# Patient Record
Sex: Male | Born: 1935 | Race: White | Hispanic: No | Marital: Married | Smoking: Never smoker
Health system: Southern US, Community
[De-identification: ages and names within clinical notes are randomized; demographics above are authoritative.]

## PROBLEM LIST (undated history)

## (undated) DIAGNOSIS — E039 Hypothyroidism, unspecified: Secondary | ICD-10-CM

## (undated) DIAGNOSIS — R0602 Shortness of breath: Secondary | ICD-10-CM

## (undated) DIAGNOSIS — J45909 Unspecified asthma, uncomplicated: Secondary | ICD-10-CM

## (undated) DIAGNOSIS — R011 Cardiac murmur, unspecified: Secondary | ICD-10-CM

## (undated) DIAGNOSIS — E785 Hyperlipidemia, unspecified: Secondary | ICD-10-CM

## (undated) DIAGNOSIS — K5730 Diverticulosis of large intestine without perforation or abscess without bleeding: Secondary | ICD-10-CM

## (undated) DIAGNOSIS — F40240 Claustrophobia: Secondary | ICD-10-CM

## (undated) DIAGNOSIS — C3490 Malignant neoplasm of unspecified part of unspecified bronchus or lung: Secondary | ICD-10-CM

## (undated) DIAGNOSIS — K219 Gastro-esophageal reflux disease without esophagitis: Secondary | ICD-10-CM

## (undated) DIAGNOSIS — R682 Dry mouth, unspecified: Secondary | ICD-10-CM

## (undated) DIAGNOSIS — R51 Headache: Secondary | ICD-10-CM

## (undated) DIAGNOSIS — C140 Malignant neoplasm of pharynx, unspecified: Secondary | ICD-10-CM

## (undated) DIAGNOSIS — K648 Other hemorrhoids: Secondary | ICD-10-CM

## (undated) DIAGNOSIS — J189 Pneumonia, unspecified organism: Secondary | ICD-10-CM

## (undated) DIAGNOSIS — K2940 Chronic atrophic gastritis without bleeding: Secondary | ICD-10-CM

## (undated) DIAGNOSIS — T4145XA Adverse effect of unspecified anesthetic, initial encounter: Secondary | ICD-10-CM

## (undated) DIAGNOSIS — N400 Enlarged prostate without lower urinary tract symptoms: Secondary | ICD-10-CM

## (undated) DIAGNOSIS — Z923 Personal history of irradiation: Secondary | ICD-10-CM

## (undated) DIAGNOSIS — T8859XA Other complications of anesthesia, initial encounter: Secondary | ICD-10-CM

## (undated) DIAGNOSIS — K635 Polyp of colon: Secondary | ICD-10-CM

## (undated) DIAGNOSIS — K117 Disturbances of salivary secretion: Secondary | ICD-10-CM

## (undated) DIAGNOSIS — G4733 Obstructive sleep apnea (adult) (pediatric): Secondary | ICD-10-CM

## (undated) DIAGNOSIS — N289 Disorder of kidney and ureter, unspecified: Secondary | ICD-10-CM

## (undated) DIAGNOSIS — D649 Anemia, unspecified: Secondary | ICD-10-CM

## (undated) HISTORY — DX: Malignant neoplasm of pharynx, unspecified: C14.0

## (undated) HISTORY — PX: OTHER SURGICAL HISTORY: SHX169

## (undated) HISTORY — PX: APPENDECTOMY: SHX54

## (undated) HISTORY — DX: Gastro-esophageal reflux disease without esophagitis: K21.9

## (undated) HISTORY — DX: Benign prostatic hyperplasia without lower urinary tract symptoms: N40.0

## (undated) HISTORY — PX: LUMBAR LAMINECTOMY: SHX95

## (undated) HISTORY — DX: Polyp of colon: K63.5

## (undated) HISTORY — DX: Chronic atrophic gastritis without bleeding: K29.40

## (undated) HISTORY — PX: TONSILLECTOMY: SHX5217

## (undated) HISTORY — DX: Disorder of kidney and ureter, unspecified: N28.9

## (undated) HISTORY — DX: Obstructive sleep apnea (adult) (pediatric): G47.33

## (undated) HISTORY — DX: Hyperlipidemia, unspecified: E78.5

## (undated) HISTORY — DX: Dry mouth, unspecified: R68.2

## (undated) HISTORY — DX: Disturbances of salivary secretion: K11.7

## (undated) HISTORY — DX: Diverticulosis of large intestine without perforation or abscess without bleeding: K57.30

## (undated) HISTORY — DX: Other hemorrhoids: K64.8

---

## 1999-02-25 ENCOUNTER — Inpatient Hospital Stay (HOSPITAL_COMMUNITY): Admission: EM | Admit: 1999-02-25 | Discharge: 1999-02-26 | Payer: Self-pay | Admitting: Emergency Medicine

## 1999-02-25 ENCOUNTER — Encounter: Payer: Self-pay | Admitting: Internal Medicine

## 2000-01-02 ENCOUNTER — Encounter (INDEPENDENT_AMBULATORY_CARE_PROVIDER_SITE_OTHER): Payer: Self-pay | Admitting: Specialist

## 2000-01-02 ENCOUNTER — Ambulatory Visit (HOSPITAL_COMMUNITY): Admission: RE | Admit: 2000-01-02 | Discharge: 2000-01-02 | Payer: Self-pay | Admitting: Gastroenterology

## 2002-03-14 ENCOUNTER — Ambulatory Visit (HOSPITAL_BASED_OUTPATIENT_CLINIC_OR_DEPARTMENT_OTHER): Admission: RE | Admit: 2002-03-14 | Discharge: 2002-03-14 | Payer: Self-pay | Admitting: Internal Medicine

## 2002-06-23 ENCOUNTER — Encounter: Payer: Self-pay | Admitting: Pulmonary Disease

## 2002-06-23 ENCOUNTER — Ambulatory Visit (HOSPITAL_COMMUNITY): Admission: RE | Admit: 2002-06-23 | Discharge: 2002-06-23 | Payer: Self-pay | Admitting: Pulmonary Disease

## 2004-01-10 ENCOUNTER — Ambulatory Visit: Payer: Self-pay | Admitting: Gastroenterology

## 2004-03-01 ENCOUNTER — Ambulatory Visit: Payer: Self-pay | Admitting: Internal Medicine

## 2004-03-05 ENCOUNTER — Ambulatory Visit: Payer: Self-pay

## 2004-03-28 ENCOUNTER — Ambulatory Visit: Payer: Self-pay | Admitting: Pulmonary Disease

## 2005-11-11 ENCOUNTER — Ambulatory Visit: Payer: Self-pay | Admitting: Internal Medicine

## 2005-11-11 ENCOUNTER — Ambulatory Visit: Payer: Self-pay | Admitting: Gastroenterology

## 2005-11-11 ENCOUNTER — Encounter: Admission: RE | Admit: 2005-11-11 | Discharge: 2005-11-11 | Payer: Self-pay | Admitting: Internal Medicine

## 2005-11-20 ENCOUNTER — Ambulatory Visit: Payer: Self-pay | Admitting: Endocrinology

## 2005-11-20 ENCOUNTER — Encounter: Admission: RE | Admit: 2005-11-20 | Discharge: 2005-11-20 | Payer: Self-pay | Admitting: Endocrinology

## 2005-11-24 ENCOUNTER — Ambulatory Visit: Payer: Self-pay | Admitting: Internal Medicine

## 2005-11-25 ENCOUNTER — Ambulatory Visit (HOSPITAL_COMMUNITY): Admission: RE | Admit: 2005-11-25 | Discharge: 2005-11-25 | Payer: Self-pay | Admitting: Gastroenterology

## 2005-11-25 ENCOUNTER — Ambulatory Visit: Payer: Self-pay | Admitting: Gastroenterology

## 2005-12-22 ENCOUNTER — Other Ambulatory Visit: Admission: RE | Admit: 2005-12-22 | Discharge: 2005-12-22 | Payer: Self-pay | Admitting: Otolaryngology

## 2006-01-07 ENCOUNTER — Encounter (INDEPENDENT_AMBULATORY_CARE_PROVIDER_SITE_OTHER): Payer: Self-pay | Admitting: Specialist

## 2006-01-07 ENCOUNTER — Ambulatory Visit (HOSPITAL_COMMUNITY): Admission: RE | Admit: 2006-01-07 | Discharge: 2006-01-07 | Payer: Self-pay | Admitting: Otolaryngology

## 2006-01-14 ENCOUNTER — Ambulatory Visit (HOSPITAL_COMMUNITY): Admission: RE | Admit: 2006-01-14 | Discharge: 2006-01-14 | Payer: Self-pay | Admitting: Otolaryngology

## 2006-01-20 HISTORY — PX: OTHER SURGICAL HISTORY: SHX169

## 2006-10-17 ENCOUNTER — Encounter: Payer: Self-pay | Admitting: *Deleted

## 2006-10-17 DIAGNOSIS — E785 Hyperlipidemia, unspecified: Secondary | ICD-10-CM

## 2006-10-17 DIAGNOSIS — N400 Enlarged prostate without lower urinary tract symptoms: Secondary | ICD-10-CM | POA: Insufficient documentation

## 2006-10-17 DIAGNOSIS — E669 Obesity, unspecified: Secondary | ICD-10-CM

## 2006-10-17 DIAGNOSIS — G4730 Sleep apnea, unspecified: Secondary | ICD-10-CM | POA: Insufficient documentation

## 2006-10-17 DIAGNOSIS — K219 Gastro-esophageal reflux disease without esophagitis: Secondary | ICD-10-CM

## 2006-10-26 ENCOUNTER — Encounter: Payer: Self-pay | Admitting: Internal Medicine

## 2006-12-07 ENCOUNTER — Encounter: Payer: Self-pay | Admitting: Internal Medicine

## 2007-01-18 ENCOUNTER — Encounter: Payer: Self-pay | Admitting: Internal Medicine

## 2007-01-21 HISTORY — PX: OTHER SURGICAL HISTORY: SHX169

## 2007-05-04 ENCOUNTER — Encounter: Payer: Self-pay | Admitting: Internal Medicine

## 2007-05-31 ENCOUNTER — Encounter: Payer: Self-pay | Admitting: Internal Medicine

## 2007-08-17 ENCOUNTER — Telehealth: Payer: Self-pay | Admitting: Internal Medicine

## 2007-08-17 ENCOUNTER — Ambulatory Visit: Payer: Self-pay | Admitting: Internal Medicine

## 2007-08-17 LAB — CONVERTED CEMR LAB
BUN: 16 mg/dL
Basophils Absolute: 0 10*3/uL
Basophils Relative: 0 %
CO2: 29 meq/L
Calcium: 9.2 mg/dL
Chloride: 104 meq/L
Creatinine, Ser: 1 mg/dL
Eosinophils Absolute: 0.1 10*3/uL
Eosinophils Relative: 1.3 %
GFR calc Af Amer: 95 mL/min
GFR calc non Af Amer: 78 mL/min
Glucose, Bld: 106 mg/dL — ABNORMAL HIGH
HCT: 41.1 %
Hemoglobin: 13.9 g/dL
Lymphocytes Relative: 15.8 %
MCHC: 33.9 g/dL
MCV: 97.5 fL
Monocytes Absolute: 0.5 10*3/uL
Monocytes Relative: 9.8 %
Neutro Abs: 3.3 10*3/uL
Neutrophils Relative %: 73.1 %
Platelets: 190 10*3/uL
Potassium: 3.9 meq/L
RBC: 4.21 M/uL — ABNORMAL LOW
RDW: 13.2 %
Sodium: 140 meq/L
TSH: 2.28 u[IU]/mL
WBC: 4.6 10*3/uL

## 2007-08-18 ENCOUNTER — Ambulatory Visit: Payer: Self-pay | Admitting: Internal Medicine

## 2007-08-18 DIAGNOSIS — S139XXA Sprain of joints and ligaments of unspecified parts of neck, initial encounter: Secondary | ICD-10-CM

## 2007-08-18 DIAGNOSIS — R5381 Other malaise: Secondary | ICD-10-CM

## 2007-08-18 DIAGNOSIS — R5383 Other fatigue: Secondary | ICD-10-CM

## 2007-08-18 DIAGNOSIS — K117 Disturbances of salivary secretion: Secondary | ICD-10-CM

## 2007-10-04 ENCOUNTER — Encounter: Payer: Self-pay | Admitting: Internal Medicine

## 2007-11-02 ENCOUNTER — Encounter: Payer: Self-pay | Admitting: Internal Medicine

## 2007-11-16 ENCOUNTER — Encounter: Payer: Self-pay | Admitting: Internal Medicine

## 2007-12-17 ENCOUNTER — Ambulatory Visit (HOSPITAL_COMMUNITY): Admission: RE | Admit: 2007-12-17 | Discharge: 2007-12-17 | Payer: Self-pay | Admitting: Emergency Medicine

## 2007-12-20 ENCOUNTER — Telehealth (INDEPENDENT_AMBULATORY_CARE_PROVIDER_SITE_OTHER): Payer: Self-pay | Admitting: *Deleted

## 2007-12-21 HISTORY — PX: CHOLECYSTECTOMY: SHX55

## 2007-12-23 ENCOUNTER — Ambulatory Visit: Payer: Self-pay | Admitting: Internal Medicine

## 2007-12-23 DIAGNOSIS — J90 Pleural effusion, not elsewhere classified: Secondary | ICD-10-CM | POA: Insufficient documentation

## 2007-12-24 ENCOUNTER — Encounter: Payer: Self-pay | Admitting: Internal Medicine

## 2007-12-24 ENCOUNTER — Telehealth: Payer: Self-pay | Admitting: Internal Medicine

## 2007-12-29 ENCOUNTER — Ambulatory Visit (HOSPITAL_COMMUNITY): Admission: RE | Admit: 2007-12-29 | Discharge: 2007-12-29 | Payer: Self-pay | Admitting: Internal Medicine

## 2008-01-06 ENCOUNTER — Ambulatory Visit: Payer: Self-pay | Admitting: Internal Medicine

## 2008-01-06 ENCOUNTER — Telehealth: Payer: Self-pay | Admitting: Internal Medicine

## 2008-01-06 ENCOUNTER — Telehealth: Payer: Self-pay | Admitting: Family Medicine

## 2008-01-06 DIAGNOSIS — R1011 Right upper quadrant pain: Secondary | ICD-10-CM

## 2008-01-07 ENCOUNTER — Encounter (INDEPENDENT_AMBULATORY_CARE_PROVIDER_SITE_OTHER): Payer: Self-pay | Admitting: Surgery

## 2008-01-07 ENCOUNTER — Inpatient Hospital Stay (HOSPITAL_COMMUNITY): Admission: AD | Admit: 2008-01-07 | Discharge: 2008-01-08 | Payer: Self-pay | Admitting: Internal Medicine

## 2008-01-07 ENCOUNTER — Ambulatory Visit: Payer: Self-pay | Admitting: Internal Medicine

## 2008-01-07 ENCOUNTER — Encounter: Payer: Self-pay | Admitting: Internal Medicine

## 2008-01-25 ENCOUNTER — Ambulatory Visit: Payer: Self-pay | Admitting: Internal Medicine

## 2008-02-03 ENCOUNTER — Telehealth: Payer: Self-pay | Admitting: Internal Medicine

## 2008-02-04 ENCOUNTER — Ambulatory Visit: Payer: Self-pay | Admitting: Internal Medicine

## 2008-02-06 ENCOUNTER — Telehealth: Payer: Self-pay | Admitting: Internal Medicine

## 2008-09-12 ENCOUNTER — Ambulatory Visit (HOSPITAL_COMMUNITY): Admission: RE | Admit: 2008-09-12 | Discharge: 2008-09-12 | Payer: Self-pay | Admitting: Unknown Physician Specialty

## 2008-10-09 ENCOUNTER — Encounter: Payer: Self-pay | Admitting: Internal Medicine

## 2008-12-26 ENCOUNTER — Encounter: Payer: Self-pay | Admitting: Internal Medicine

## 2008-12-29 ENCOUNTER — Telehealth: Payer: Self-pay | Admitting: Internal Medicine

## 2009-04-09 ENCOUNTER — Encounter: Payer: Self-pay | Admitting: Internal Medicine

## 2009-07-11 ENCOUNTER — Telehealth: Payer: Self-pay | Admitting: Internal Medicine

## 2009-09-25 ENCOUNTER — Ambulatory Visit (HOSPITAL_COMMUNITY): Admission: RE | Admit: 2009-09-25 | Discharge: 2009-09-25 | Payer: Self-pay | Admitting: Unknown Physician Specialty

## 2009-10-03 ENCOUNTER — Encounter: Payer: Self-pay | Admitting: Internal Medicine

## 2009-10-12 ENCOUNTER — Encounter: Payer: Self-pay | Admitting: Internal Medicine

## 2009-10-26 ENCOUNTER — Telehealth: Payer: Self-pay | Admitting: Internal Medicine

## 2009-11-16 ENCOUNTER — Telehealth: Payer: Self-pay | Admitting: Internal Medicine

## 2010-02-09 ENCOUNTER — Encounter: Payer: Self-pay | Admitting: Internal Medicine

## 2010-02-19 NOTE — Progress Notes (Signed)
  Phone Note Refill Request Message from:  Fax from Pharmacy on October 26, 2009 8:23 AM  Refills Requested: Medication #1:  DIAZEPAM 2 MG  TABS 1/2 tab up to 3 tabs by mouth as needed for muscle spasm-neck   Last Refilled: 10/13/2009 Please Advise refill, fax from [facility]  Initial call taken by: Ami Bullins CMA,  October 26, 2009 8:23 AM  Follow-up for Phone Call        ok x 5 Follow-up by: Jacques Navy MD,  October 26, 2009 8:36 AM    Prescriptions: DIAZEPAM 2 MG  TABS (DIAZEPAM) 1/2 tab up to 3 tabs by mouth as needed for muscle spasm-neck  #60 x 5   Entered by:   Ami Bullins CMA   Authorized by:   Jacques Navy MD   Signed by:   Bill Salinas CMA on 10/26/2009   Method used:   Telephoned to ...       [facility]* (retail)       [address]       [place], [place]  [id number]       Ph: [phone]       Fax: [phone]   RxID:   [id number]

## 2010-02-19 NOTE — Letter (Signed)
Summary: [facility]   Imported By: Lennie Odor 10/08/2009 15:11:57  _____________________________________________________________________  External Attachment:    Type:   Image     Comment:   External Document

## 2010-02-19 NOTE — Letter (Signed)
Summary: [facility]   Imported By: Sherian Rein 04/16/2009 12:03:55  _____________________________________________________________________  External Attachment:    Type:   Image     Comment:   External Document

## 2010-02-19 NOTE — Letter (Signed)
Summary: [facility]   Imported By: Lester Mayville 10/15/2009 08:25:51  _____________________________________________________________________  External Attachment:    Type:   Image     Comment:   External Document

## 2010-02-19 NOTE — Letter (Signed)
Summary: [facility]   Imported By: Lennie Odor 10/23/2009 15:05:31  _____________________________________________________________________  External Attachment:    Type:   Image     Comment:   External Document

## 2010-02-19 NOTE — Progress Notes (Signed)
Summary: RX  Phone Note Call from Patient   Caller: Patient Reason for Call: Talk to Nurse Summary of Call: WANTS TO CHANGE THE RX OF DIAZEPAM 2MG , PT STATES HE IS TAKING 3 PILLS IN THE AM AND PM, WANTS TO CHANGE DOSAGE, DUE TO COST, POSSIBLY 6MG  AND 60 TABS TO LAST HIM A MTH, PLEASE SEND TO [place] Initial call taken by: Migdalia Dk,  November 16, 2009 1:19 PM    New/Updated Medications: DIAZEPAM 5 MG TABS (DIAZEPAM) 1 tab two times a day Prescriptions: DIAZEPAM 5 MG TABS (DIAZEPAM) 1 tab two times a day  #60 x 5   Entered by:   Ami Bullins CMA   Authorized by:   Jacques Navy MD   Signed by:   Bill Salinas CMA on 11/16/2009   Method used:   Telephoned to ...       [facility]* (retail)       [address]       [place], [place]  [id number]       Ph: [phone]       Fax: [phone]   RxID:   [id number]

## 2010-02-19 NOTE — Progress Notes (Signed)
  Phone Note Refill Request Message from:  Fax from Pharmacy on July 11, 2009 9:57 AM  Refills Requested: Medication #1:  DIAZEPAM 2 MG  TABS 1/2 tab up to 3 tabs by mouth as needed for muscle spasm-neck Please Advise refill for pt  Initial call taken by: Ami Bullins CMA,  July 11, 2009 9:58 AM  Follow-up for Phone Call        ok for refill x 5 Follow-up by: Jacques Navy MD,  July 11, 2009 1:24 PM    Prescriptions: DIAZEPAM 2 MG  TABS (DIAZEPAM) 1/2 tab up to 3 tabs by mouth as needed for muscle spasm-neck  #60 x 5   Entered by:   Ami Bullins CMA   Authorized by:   Jacques Navy MD   Signed by:   Bill Salinas CMA on 07/11/2009   Method used:   Telephoned to ...       [facility]* (retail)       [address]       [place], [place]  [id number]       Ph: [phone]       Fax: [phone]   RxID:   [id number]

## 2010-04-04 LAB — POCT I-STAT, CHEM 8
Calcium, Ion: 1.2 mmol/L (ref 1.12–1.32)
Creatinine, Ser: 1.3 mg/dL (ref 0.4–1.5)
Glucose, Bld: 106 mg/dL — ABNORMAL HIGH (ref 70–99)
HCT: 48 % (ref 39.0–52.0)
TCO2: 29 mmol/L (ref 0–100)

## 2010-04-10 ENCOUNTER — Ambulatory Visit (HOSPITAL_COMMUNITY): Payer: BLUE CROSS/BLUE SHIELD

## 2010-04-10 ENCOUNTER — Other Ambulatory Visit (HOSPITAL_COMMUNITY): Payer: Self-pay | Admitting: *Deleted

## 2010-04-11 ENCOUNTER — Ambulatory Visit (HOSPITAL_COMMUNITY)
Admission: RE | Admit: 2010-04-11 | Discharge: 2010-04-11 | Disposition: A | Discharge status: Home / Self Care | Payer: Medicare Other | Source: Ambulatory Visit | Attending: Internal Medicine | Admitting: Internal Medicine

## 2010-04-11 DIAGNOSIS — Z09 Encounter for follow-up examination after completed treatment for conditions other than malignant neoplasm: Secondary | ICD-10-CM | POA: Insufficient documentation

## 2010-04-11 DIAGNOSIS — Z8589 Personal history of malignant neoplasm of other organs and systems: Secondary | ICD-10-CM | POA: Insufficient documentation

## 2010-05-30 ENCOUNTER — Other Ambulatory Visit (HOSPITAL_COMMUNITY): Payer: Self-pay | Admitting: Internal Medicine

## 2010-06-04 NOTE — Op Note (Signed)
Samuel Hurley, Samuel Hurley              ACCOUNT NO.:  [id number]   MEDICAL RECORD NO.:  [id number]          PATIENT TYPE:  INP   LOCATION:  1505                         FACILITY:  [facility]   PHYSICIAN:  Thornton Park. Daphine Deutscher, MD  DATE OF BIRTH:  08-03-1934   DATE OF PROCEDURE:  01/07/2008  DATE OF DISCHARGE:  01/08/2008                               OPERATIVE REPORT   PREOPERATIVE DIAGNOSES:  Chronic cholecystitis, cholelithiasis.   POSTOPERATIVE DIAGNOSES:  Chronic cholecystitis, cholelithiasis.   PROCEDURE:  Laparoscopic cholecystectomy with intraoperative  cholangiogram.   SURGEON:  Thornton Park. Daphine Deutscher, MD.   ASSISTANT:  No MD assist.   ANESTHESIA:  General endotracheal.   DESCRIPTION OF PROCEDURE:  Mr. Samuel Hurley came to the OR despite sipping a  lot of water.  He was intubated carefully because of his previous  radiation to his head and neck.  This was accomplished without  complications.  The abdomen was then prepped with Techni-Care and draped  sterilely.  Access to the abdomen was gained through the umbilicus with  a longitudinal split and entering with Hasson technique without  difficulty.  The abdomen was insufflated and 3 trocars were placed in  the upper abdomen.  The gallbladder was grasped, elevated and a critical  view was achieved by dissecting Calot's triangle with hook  electrocautery.  Clips were placed on the vessel felt to be the cystic  artery.  A clip was placed up on the gallbladder and one down on a  little artery accompanying the cystic duct.  I incised the cystic duct,  inserted a Reddick catheter, took a dynamic cholangiogram, which showed  good intrahepatic filling and free flow into the duodenum.  The cystic  duct was then triple clipped and divided.  The cystic artery was triple  clipped and divided, and then the gallbladder was removed from the  gallbladder bed.  There was a little venous lake at the very top, which  was controlled with hook electrocautery, and I  left a little piece of  Surgicel on that, and I went back after closing the abdominal defect,  and no bleeding was seen.  The gallbladder was brought out through the  umbilicus and because of the stones, I had to enlarge the umbilical  incision, both the skin and the fascia, because the size of the stones,  but this was removed in a bag without spillage.  The fascial defect was  closed with figure-of-eight sutures of #1 Vicryl and the wounds were  irrigated and closed with 4-0 Vicryl.  The patient tolerated the  procedure well and was taken to the recovery room in satisfactory  condition.      Thornton Park Daphine Deutscher, MD  Electronically Signed     MBM/MEDQ  D:  01/07/2008  T:  01/08/2008  Job:  [id number]

## 2010-06-04 NOTE — Consult Note (Signed)
NAMECHANDLER, SWIDERSKI              ACCOUNT NO.:  [id number]   MEDICAL RECORD NO.:  [id number]          PATIENT TYPE:  INP   LOCATION:  1505                         FACILITY:  [facility]   PHYSICIAN:  Thornton Park. Daphine Deutscher, MD  DATE OF BIRTH:  [birth year]   DATE OF CONSULTATION:  01/07/2008  DATE OF DISCHARGE:                                 CONSULTATION   CHIEF COMPLAINT:  Right upper quadrant pain and gallstones.   HISTORY:  I was asked by Dr. Debby Bud to see Mr. [name] regarding his  right upper quadrant pain and known gallstones.  Apparently, he has had  pneumonia in the right lower lobe with a layered effusion.  Recently, he  has had some episodes of pretty severe cramping and right upper quadrant  abdominal pain.  He was admitted yesterday with was felt to be biliary  colic.   PAST MEDICAL HISTORY:  1. Remarkable in that he has had previous tongue cancer with      radiation, radical neck dissection and PEG tube for enteral      nutrition.  2. Also he has had a previous appendectomy.  3. He does have fairly significant xerostomia stomatitis because he      has very little salivary function.   MEDICATIONS:  1. Levothyroxine.  2. Terazosin.  3. Pilocarpine.  4. Propoxyphene for pain.  5. He has also been on Avelox for his pneumonia.   PHYSICAL EXAMINATION:  He has got no real acute right upper quadrant  pain at the present time.   IMPRESSION:  Possible chronic cholecystitis.   PLAN:  We will perform a cholecystectomy.      Thornton Park Daphine Deutscher, MD  Electronically Signed     MBM/MEDQ  D:  01/07/2008  T:  01/08/2008  Job:  [id number]   cc:   Rosalyn Gess. Norins, MD  [address]  [place]  [place] [address]

## 2010-06-04 NOTE — Discharge Summary (Signed)
[name], [name]  [id number]   MEDICAL RECORD NO.:  [id number]          PATIENT TYPE:  INP   LOCATION:  1505                         FACILITY:  [facility]   PHYSICIAN:  Corwin Levins, MD      DATE OF BIRTH:  Sep 16, 1934   DATE OF ADMISSION:  01/06/2008  DATE OF DISCHARGE:  01/08/2008                               DISCHARGE SUMMARY   DISCHARGE DIAGNOSES:  1. Right upper quadrant abdominal pain, consistent with biliary colic.  2. Cholelithiasis.  3. Status post cholecystectomy.  4. Benign prostatic hypertrophy with mild postoperative urinary      retention.  5. Gastroesophageal reflux disease.  6. Hyperlipidemia.  7. Benign prostatic hypertrophy.  8. History of squamous cell carcinoma of the pharynx.  9. Transitional cell carcinoma of the kidney.  10.History of xerostomia.  11.Obstructive sleep apnea.   PROCEDURES:  Cholecystectomy performed on January 07, 2008.   CONSULTATIONS:  Surgery.   HISTORY AND PHYSICAL:  As per EMR, per Dr. Debby Bud, detailed on the paper  chart in the hospital.   HOSPITAL COURSE:  Mr. Samuel Hurley is a very nice 76 year old white male who  was admitted initially on January 06, 2008 with great tenderness of the  right upper extremity.  History consistent with biliary colic and normal  amylase, lipase, CMET, and CBC.  Ultrasound showed gallstones but no  cholecystitis.  There was a normal common bile duct.  Surgical  consultation was obtained.  He was thought to require laparoscopic  cholecystectomy, which was performed later that day.  Patient did well  postoperatively, although he did have some confusion with morphine  sulfate overnight.  On the morning of discharge, he was quite lucid,  wanting to go home, wanting only Darvocet for pain, and requesting Foley  catheter to remain in place for 3 days, to be removed by himself, as he  is trained to do so, to avoid urinary retention problems, as he has had  in the past with the  previous surgery.  Postop hemoglobin is 13.1,  normal white blood cells, BMET normal.  LFTs within normal limits with  total bilirubin of 1.5.   As he is ambulating, eating well, pain controlled, and no other issues  revealed during evaluation and no complications from surgery, he is felt  to gain maximum benefit from this hospitalization.   DISPOSITION:  Discharge to home later today, if okay with surgery.   DISCHARGE MEDICATIONS:  All those same as on admission, including:  1. Darvocet 1 p.o. q.6h. p.r.n.  2. Levothyroxine 75 mcg 1 p.o. daily.  3. Terazosin 1 mg p.o. daily.  4. Pilocarpine HCL 5 mg b.i.d.  5. PeriGuard p.r.n.  6. Gingko biloba elixir b.i.d.  7. Vitamin B12 2000 mcg p.o. daily.  8. Diazepam 2 mg 1/2 p.o. daily p.r.n.  9. Avelox 400 mg p.o. daily to finish his course.      Corwin Levins, MD  Electronically Signed     JWJ/MEDQ  D:  01/08/2008  T:  01/08/2008  Job:  [id number]

## 2010-06-07 NOTE — Discharge Summary (Signed)
[place]  Patient:    Samuel Hurley                         MRN: [id number] Adm. Date:  02/25/99 Disc. Date: 02/26/99 Attending:  Rosalyn Gess. Norins, M.D. [facility]                           Discharge Summary  ADMITTING DIAGNOSIS:  Chest pain, rule out myocardial infarction.  DISCHARGE DIAGNOSIS:  Chest pain, noncardiac origin.  CONSULTANTS:  Dr. Glennon Hamilton and Dr. Charlton Haws for cardiology.  PROCEDURES:  Cardiac catheterization performed February 25, 1999, which revealed  normal coronary arteries, normal LV, with an ejection fraction of 60%, no MR, normal AO.  HISTORY OF PRESENT ILLNESS:  Mr. Signe Colt is a 76 year old married white male with  a history of prior catheterization in 1983 for chest pain which was negative, performed in [place], [place].  Patient was diagnosed with angina in 1997, was  seen in consult by the cardiologist in [place], and had a 2-D echo at that time, which was normal.  The patient was given a prescription for Transderm nitroglycerin to use p.r.n.  The patient presented to the office today as a new  patient complaining of a two-week history of progressive chest pain that was definitely exertional.  Patient also reported having left pain.  Patient did report paroxysmal nocturnal dyspnea.  He denies diaphoresis, he did have shortness of breath.  Cardiac risk factors were positive for family history of heart disease in his father and grandfather, positive for obesity, positive for hyperlipidemia, positive for male gender, negative for diabetes, negative for tobacco abuse.  PATIENTS SYMPTOMS:  He was admitted to hospital.  His cardiac enzymes were performed and were negative, with a CK that was normal and troponin that was 0.03. Patient was seen in consultation by the cardiology service, who agreed with the  significance of the patients symptoms and recommended coronary angiography.  Patient was taken on the  day of admission to the catheterization laboratory, with results as above.  Based on catheterization study, patient did not have any significant coronary artery disease, therefore, his chest pain was most likely f a noncardiac origin.  Patient did tolerate his cardiac catheterization well.  With the patients cardiac enzymes being negative with a normal cardiac catheterization, with him being hemodynamically stable, he is felt to be ready or discharge home on the morning of February 26, 1999.  DISPOSITION:  Patient is discharged home.  DISCHARGE MEDICATIONS:  Protonix 40 mg q.d.  No other medications.  FOLLOW-UP:  He will be seen in the office for follow-up in one week to assess his progress and relieve his symptoms.  CONDITION AT TIME OF DISCHARGE DICTATION:  Stable, pain-free. DD:  02/25/99 TD:  02/26/99 Job: [id number]

## 2010-06-07 NOTE — Cardiovascular Report (Signed)
[name]  Patient:    Samuel Hurley                      MRN: [id number] Proc. Date: 02/25/99 Adm. Date:  [id number] Attending:  Duke Salvia CC:         Rosalyn Gess. Norins, M.D. [facility]             E. Graceann Congress, M.D. [facility]                        Cardiac Catheterization  PROCEDURE PERFORMED:  Coronary arteriography.  INDICATIONS:  Crescendo chest pain.  RESULTS: 1. The left main coronary artery was normal.  2. The left anterior descending artery was normal.  3. The first and second diagonal branches were normal.  4. The circumflex coronary artery was normal.  5. The right coronary catheter somewhat deep-seated the right coronary artery.    There was minimal spasm.  There was some streaming of dye, however, there were    no critical lesions in the right coronary artery.  RAO VENTRICULOGRAPHY:  RAO ventriculography was normal.  Ejection fraction was n the 60% range.  There was no gradient across the aortic valve and no MR.  HEMODYNAMIC DATA:  Aortic pressure was 152/87.  LV pressure was 148/17.  This was a pre-A wave EDP.  IMPRESSION:  The patients chest pain would appear to be noncardiac in etiology.  He has benign right bundle branch block.  I discussed the case with Dr. Debby Bud nd the patient will be stay overnight to make sure that his right femoral artery heals well.  He will then have an outpatient GI workup. DD:  02/25/99 TD:  02/25/99 Job: [id number]

## 2010-06-07 NOTE — Assessment & Plan Note (Signed)
[facility]                                   ON-CALL NOTE   Samuel Hurley, Samuel Hurley                     MRN:          [id number]  DATE:11/25/2005                            DOB:          Jun 18, 1934    Ms. Samuel Hurley called stating that her husband had an attempted colonoscopy  today and subsequent attempted barium enema.  The barium enema could not be  completed because there was too much air.  Apparently he has had gas  retention with colonoscopy in the past and is having abdominal pain, but no  fever or back pain.  He will not come to the emergency room, she says.  I  advised him to walk, turn from side to side, and try the knee-chest position  to try to expel the air and stay on a liquid diet at this point.  They are  to call back if this is not successful.     Samuel Boop, Samuel Hurley,FACG  Electronically Signed    CEG/MedQ  DD: 11/25/2005  DT: 11/26/2005  Job #: [id number]   cc:   Ulyess Mort, Samuel Hurley

## 2010-06-07 NOTE — Procedures (Signed)
[facility]  Patient:    Samuel Hurley, Samuel Hurley.                    MRN: [id number] Proc. Date: 01/02/00 Attending:  Ulyess Mort, M.D. [facility] CC:         Rosalyn Gess. Norins, M.D. [facility]   Procedure Report  PROCEDURE:  SURGEON:  Ulyess Mort, M.D.  INDICATIONS:  The patient came into the office on December 23, 1999, noting increasing gas, and regurgitation after eating, and having increasing frequency of bowel movements up to five times a day with chunks and lots of gas, and also bloating.  He said that he has had this for four or five years. Has occasional abdominal stabbing pain, cramping in nature.  He says it wakes him up from sleep, and he has to get up and go to the bathroom.  He had some rectal incontinence as well, but no bleeding.  It was felt thus he should have a full colonoscopic examination with his change in bowel habits, and the patient is scheduled for the procedure as noted.  ANESTHESIA:  The patient was premedicated with 9 mg of Versed and 87.5 mcg of fentanyl IV.  DESCRIPTION OF PROCEDURE:  The Olympus colonoscope was passed through the entire colon with slight difficulty.  He had a poor prep.  He had large chunks of stool as he had complained about in the past and this is all because of his diverticular disease.  He had a quite tortuous area in the mid sigmoid colon. I think that also gives him probably some of his symptomatology.  The endoscope was passed into the terminal ileum.  It appeared normal.  The endoscope was then retracted back to the right colon.  There was a large diverticulum in the right colon and some scattered small diverticuli as well. In the mid transverse colon, there was a 5 mm ________, and there was a 5 mm sessile polyp or lesion which I biopsied and cauterized due to obstruction. In the descending colon was a 3-4 mm sessile polyp or lesion which I cauterized due to obstruction and biopsied.  In the rectal  vault, there were two small 2 mm polyps which were cauterized due to obstruction.  In the sigmoid colon itself and part of the descending colon and transverse colon, there was moderate to severe changes of diverticular disease with some stenosis secondary to tortuosity of the sigmoid colon itself.  The endoscope was then retracted completely after being inverted in the rectum, and some internal and external hemorrhoids, grade 1-2 was noted.  The endoscope was then retracted completely.  The patient tolerated the procedure nicely.  Rectal examination revealed only minimal if any decreased anal sphincter tone.  Prostate appeared normal, although it was difficult to feel because he is quite a large man.  IMPRESSION: 1. In the cecum, moderate to severe diffuse diverticular disease with much    significant changes noted in the sigmoid colon as described causing    probably a partial obstructive component with tortuosity and stenosis in    this area. 2. Small colon polyps, biopsied and removed as noted. 3. Small grade 1-2 internal and external hemorrhoids.  PLAN:  I will treat his hemorrhoids.  I think his symptoms are based on the diverticular disease he is having.  I think he needs more bulk and hopefully this will be helpful to him, and maybe some stool softeners and laxatives intermittently to see if this  will give him any relief.  I think it will help his anal incontinence as well.  Certainly, I think because of the small polyp tissue, that repeat examination sometime within the next two years. DD:  01/02/00 TD:  01/03/00 Job: [id number]

## 2010-06-07 NOTE — Letter (Signed)
Jun 15, 2006    Reverend Carrol C. Ardeth Sportsman.  [facility]  [address]  [place], [place]  [address].   RE:  MASAKI, ROTHBAUER  MRN:  [id number]  /  DOB:  18-May-1934   Dear Jena Gauss:   Thank you very much for your note of May 20th.   Your doctors at [facility] have done a good job of keeping me informed  of your head and neck cancer.   It is of concern that you now have a 1-inch mass on your kidney, which  is worrisome for possible carcinoma.  It may have been hard to see  previously, depending on the type of contrast you had with your studies.   Hopefully, a dedicated CT scan of the kidney will rule this out for any  kind of concerning lesion.   My best wishes go to you, as you continue your treatment.  Thank you  once again for your note of May 20th.    Sincerely,      Rosalyn Gess. Norins, MD  Electronically Signed    MEN/MedQ  DD: 06/15/2006  DT: 06/15/2006  Job #: [id number]

## 2010-06-07 NOTE — Op Note (Signed)
NAMELIPA, KNAUFF              ACCOUNT NO.:  [id number]   MEDICAL RECORD NO.:  [id number]          PATIENT TYPE:  AMB   LOCATION:  SDS                          FACILITY:  [facility]   PHYSICIAN:  Suzanna Obey, M.D.       DATE OF BIRTH:  [birth year]   DATE OF PROCEDURE:  01/07/2006  DATE OF DISCHARGE:  01/07/2006                               OPERATIVE REPORT   PREOPERATIVE DIAGNOSIS:  Right neck mass.   POSTOPERATIVE DIAGNOSIS:  Right neck mass.   SURGICAL PROCEDURE:  Direct laryngoscopy with biopsy of nasopharynx,  right tonsil, base of tongue, and right piriform sinus.   SURGEON:  Suzanna Obey, M.D.   ANESTHESIA:  General.   ESTIMATED BLOOD LOSS:  Approximately 10 cc.   INDICATIONS:  This is a 76 year old, who has developed a right neck  mass, that had a fine-needle aspiration performed, which showed squamous  cell carcinoma.  He has no evidence of a primary by examination, and he  is here for direct laryngoscopy with guided random biopsies.  He was  informed of the risks and benefits of the procedure, including bleeding,  infection, scarring, perforation, injury to his teeth, failure to find  the primary, and risk of the anesthetic.  All questions were answered  and consent was obtained.   OPERATION:  The patient was taken to the operating room and placed in  supine position.  After adequate general endotracheal tube anesthesia,  he was examined with the anterior commissure scope.  There were no  obvious lesions in the oropharynx or oral cavity.  He had a little bit  or irregular tissue along the right tonsil, and there was a small amount  of slightly cobblestone-appearing tissue in the right piriform.  The  tongue and base of tongue and tonsils did not have any palpable firmness  in any location.  Biopsies were then taken from the right piriform  irregular area, the right tonsil area, the right base of tongue, and the  right nasopharynx.  The nasopharynx was examined through a  mirror and a  biopsy was taken through the nose with mirror guidance visualization up  from the palate.  Hemostasis was achieved with epi-soaked pledgets.  The  cricopharyngeus region was examined and there was no evidence of any  obvious lesions; and a small redundant piece of mucosa was seen, but it  did not have any firmness and no irregularity to the tissue when grasped  with the forceps gently.  The patient had good hemostasis and was  awakened and brought to recovery in stable condition.  Counts correct.           ______________________________  Suzanna Obey, M.D.     JB/MEDQ  D:  01/07/2006  T:  01/07/2006  Job:  [id number]

## 2010-06-07 NOTE — Letter (Signed)
November 25, 2005    Windy Fast L. Earlene Plater, M.D.  [address]  [place], [place] [address]   RE:  [name], [name]  MRN:  [id number]  /  DOB:  02/22/1934   Dear Ferne Reus:   Thank you very much for seeing Mr. Samuel Hurley, a very pleasant 76-year-  old gentleman, for evaluation for increasing PSA.   Mr. Samuel Hurley has been followed intermittently for general medical care.  Please see my attached note.  As you will see, the patient was noted to have  an elevated PSA at 5.1 in 2004.  On recent laboratory in October of 2007,  his PSA was now 7.95.  The patient has not had a recent prostate exam.   Given his age and increasing PSA with an increased rate of acceleration, I  believe he would benefit from further evaluation under your expert guidance.   If I can provide any additional information other than what is attached,  please do not hesitate to contact me.  As always, I appreciate your  assistance in the care of my patients.  I look forward to hearing from you.    Sincerely,     ______________________________  Rosalyn Gess. Norins, MD    MEN/MedQ  DD: 11/25/2005  DT: 11/25/2005  Job #: [id number]

## 2010-06-07 NOTE — Letter (Signed)
February 21, 2006    Rev. Zara Council. Ardeth Sportsman   REZIAIR, PENSON  MRN:  [id number]  /  DOB:  Sep 04, 1934   Dear Rev. [name],   Thank you very much for your letter of February 12, 2006. I appreciate  you keeping me up to date as to not only the medical progress but your  personal progress with this affliction.   Clearly, this is a tough row to hoe. I have great confidence in the  medical staff at the [facility] in [place] as well as  all of the folks at the [facility].   Your course of therapy to me, a nononcologist, sounds very reasonable.  Although it would have been wonderful to participate in a study which  may have spared your native tissue, it does seem that this is an urgent  issue that needs attention, and there is not really time to participate  either in experimental protocols or delay your treatment.   As you move forward with your treatment, if there is any way I can be of  assistance to you, please do not hesitate to contact me.   Once again, I appreciate you keeping me informed as to both your medical  progress and your personal progress.    Sincerely,      Rosalyn Gess. Norins, MD  Electronically Signed    MEN/MedQ  DD: 02/21/2006  DT: 02/21/2006  Job #: [id number]

## 2010-06-07 NOTE — Letter (Signed)
January 22, 2006    Mr. Samuel Hurley  [address]  [place], [place]  [address]   RE:  BRODE, SCULLEY  MRN:  [id number]  /  DOB:  April 30, 1934   Dear 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  Unfortunately,  I do not recall having heard back from Dr. Jearld Fenton in regards to the  needle biopsy, so this came to me as somewhat of a surprise.   I understand through reading the consult note from Dr. Santiago Bumpers  that you are going to have some wisdom teeth removed and then proceed  with radiation and chemotherapy.   I also, in reviewing your record, noted that I had also referred you to  Dr. Darvin Neighbours, and I do not know if you have yet made that appointment  because I have not heard anything from Dr. Earlene Plater either.   In the times ahead with your treatment, if I can be of any assistance to  you please do not hesitate to contact me.  I appreciate your notifying  Dr. Brendolyn Patty of my involvement in your care so I can be kept apprised of  your progress.   Once again, best wishes to you as you proceed with your treatment.  Please do not hesitate to contact me if I can be of any assistance to  you.    Sincerely,      Rosalyn Gess. Norins, MD  Electronically Signed    MEN/MedQ  DD: 01/22/2006  DT: 01/22/2006  Job #: [id number]

## 2010-06-07 NOTE — Assessment & Plan Note (Signed)
[facility]                             PRIMARY CARE OFFICE NOTE   DAEMIEN, FRONCZAK                     MRN:          [id number]  DATE:11/24/2005                            DOB:          26-Feb-1934    Mr. Samuel Hurley is a 76 year old Caucasian gentleman who I last saw on November 11, 2005. At that visit, the patient was complaining of shortness of breath,  dyspnea on exertion with rhinitis. Of note, the patient had had a negative  stress Myoview in the past. He had a cardiac catheterization in 2001 which  showed no obstructive coronary disease. The patient has had a pulmonary  function studies which show minimal air trapping. He now is having decreased  respiratory tolerance.   The patient also at that visit complained of a nodule in his right neck and  also had nodules in his right flank and foot.   The patient complained of left upper extremity pain which radiates from his  trapezius to his wrist and is concerning for cervical origin of his  discomfort.   In the interval since that visit, the patient has had a chest x-ray which  was unremarkable. MRI was attempted but he was intolerant because of  claustrophobia. He came to CT scan of the neck which revealed a 3 x 3.1 x  4.7 cm mass in the area of the right mandible consistent with  lymphadenopathy with no other abnormality seen. He was subsequently referred  to Dr. Suzanna Obey for ENT. Dr. Jearld Fenton has placed the patient on 10 days of  antibiotic therapy for possible lymphadenitis with scheduled followup in  early December. If he has a persistent mass unchanged after antibiotic  therapy, he is a candidate for a needle biopsy.   The patient had laboratories at his October 26 visit that actually was  performed on October 23 which revealed a white count of 8600, hemoglobin of  15.3 grams with a normal differential. Sed rate was 7. Chemistries were  unremarkable with a serum glucose of 106. Renal  function was normal with a  creatinine of 1.1, GFR of 71 mL per minute. Liver functions were normal. TSH  was normal at 2.37, PSA was elevated at 7.95. Cervical spine films have been  ordered but are not available to me at this dictation.   PAST MEDICAL HISTORY:   SURGICAL:  1. Tonsillectomy.  2. Appendectomy.  3. Lumbar laminectomy.  4. Bilateral ganglionectomies.   MEDICAL ILLNESSES:  1. Usual childhood diseases.  2. GERD.  3. Obstructive sleep apnea but the patient having been intolerant of CPAP.  4. Hyperlipidemia.  5. Obesity.  6. BPH.   CURRENT MEDICATIONS:  1. Lipitor 40 mg q.h.s.  2. Nexium 40 mg b.i.d.  3. HCTZ 25 mg daily.  4. Mucinex 600 mg twice a day.  5. Singulair 10 mg daily.   PHYSICAL EXAMINATION:  Physical exam was conducted November 11, 2005 which  revealed:  VITAL SIGNS:  Temperature 99.9, blood pressure 128/67, pulse 62, weight 248.  Height is 6 foot 1 inch.  GENERAL:  This is an  overweight Caucasian male in no acute distress.  HEENT:  Revealed the absence of sinus tenderness. Oropharynx without  lesions, conjunctiva and sclera was clear. EACs and TMs were normal.  NECK:  Supple.  CHEST:  The patient had no CVA tenderness.  LUNGS:  Clear with good breath sounds without any increased work of  breathing or use of accessory musculature.  CARDIOVASCULAR:  Regular rate and rhythm without murmurs, rubs or gallops.  ABDOMEN:  Obese, soft.  NODES:  The patient had a 2 x 3 estimated size rubbery nodule at the right  cervical chain versus submandibular area. There were no axillary or  supraclavicular nodes.  GU:  Deferred.  MUSCULOSKELETAL:  Significant for full range of motion without crepitus in  the neck or shoulders.  NEUROLOGIC:  Unremarkable with normal DTRs. Motor strength was equal  bilaterally in the upper extremities.   ASSESSMENT/PLAN:  1. Lymphadenopathy.  Patient with plan as above with course of antibiotics      to be completed with followup  with Dr. Jearld Fenton. He will need for needle      biopsy if he has a persistent enlarged lymph node.  2. Gastroesophageal reflux disease.  Stabilized on present medications.  3. Lipids. The patient was asked to resume Lipitor. He will need to have a      followup lipid panel.  4. Pulmonary:  Patient with last pulmonary functions March 28, 2004 which      showed minimal air trapping. The patient continues to have dyspnea. He      has a known history of obstructive sleep apnea. He has had a      cardiovascular evaluation which has been unremarkable. Plan continued      observation at this time and management of his allergic rhinitis with      Singulair and Mucinex.  5. Genitourinary:  Patient with an elevated PSA with an absolute change      from 2004 of 2.85 which does exceed 0.7 per year average, increase of      2.1. Given the patient's age and rising PSA, he is a candidate for      further evaluation by urology and he will be referred to Dr. Darvin Neighbours.   The patient will return to see me following this repeat evaluation by Dr.  Jearld Fenton. We will schedule him to see Dr. Darvin Neighbours later in December.    ______________________________  Rosalyn Gess Norins, MD    MEN/MedQ  DD: 11/25/2005  DT: 11/25/2005  Job #: [id number]   cc:   Veverly Fells Earlene Plater, M.D.

## 2010-06-11 ENCOUNTER — Telehealth: Payer: Self-pay | Admitting: *Deleted

## 2010-06-11 NOTE — Telephone Encounter (Signed)
Ok for refill x 5 

## 2010-06-11 NOTE — Telephone Encounter (Signed)
Fax from [facility].  [phone]. Pt is requesting a 90 day supply on his diazepam 5mg  SIG one tablet twice a day. Please Advise refills

## 2010-06-12 ENCOUNTER — Other Ambulatory Visit: Payer: Self-pay | Admitting: Internal Medicine

## 2010-06-12 MED ORDER — DIAZEPAM 5 MG PO TABS: 5.0000 mg | ORAL_TABLET | Freq: Two times a day (BID) | ORAL | Status: DC

## 2010-07-18 ENCOUNTER — Encounter: Payer: Self-pay | Admitting: Internal Medicine

## 2010-07-19 ENCOUNTER — Encounter: Payer: Self-pay | Admitting: Internal Medicine

## 2010-07-22 ENCOUNTER — Ambulatory Visit (INDEPENDENT_AMBULATORY_CARE_PROVIDER_SITE_OTHER): Payer: Medicare Other | Admitting: Internal Medicine

## 2010-07-22 ENCOUNTER — Ambulatory Visit (INDEPENDENT_AMBULATORY_CARE_PROVIDER_SITE_OTHER)
Admission: RE | Admit: 2010-07-22 | Discharge: 2010-07-22 | Disposition: A | Discharge status: Home / Self Care | Payer: Medicare Other | Source: Ambulatory Visit | Attending: Internal Medicine | Admitting: Internal Medicine

## 2010-07-22 VITALS — BP 122/74 | HR 61 | Temp 97.3°F | Wt 97.0 lb

## 2010-07-22 DIAGNOSIS — M545 Low back pain, unspecified: Secondary | ICD-10-CM

## 2010-07-22 NOTE — Patient Instructions (Signed)
Low back pain - exam is negative for any radicular findings, e.g. Pinched nerve from herniated disk. Suspect that this is all muscle strain. Plan - lumbar spine films to be sure there is no disk disease. For pain: heat patch, massage therapy if possible, otc anti-inflammatory medication, i.e. Aleve, etc. Resume normal activities with care - smart lifting.

## 2010-07-23 NOTE — Progress Notes (Signed)
Subjective:    Patient ID: Samuel Hurley, male    DOB: [birth year], 76 y.o.   MRN: [id number]  HPI The REv. Samuel Hurley presents with right low back/hip pain. He was working and lifted a battery with poor posture, rotated at the waist striking his leg on a table corner and at that time felt pain in the right low back and leg.  He has continued to have pain although it is less. He has had no paresthesia, weakness, or loss of mobility. He has no ;prior h/o back trouble. He has not required anything for pain beyond APAP.  Past Medical History  Diagnosis Date  . GERD (gastroesophageal reflux disease)   . Hyperlipidemia   . OSA (obstructive sleep apnea)   . BPH (benign prostatic hypertrophy)   . Kidney disease     TCC  . Xerostomia   . Pharynx cancer     squamous cell stage 4,s/p XRT,chemo, neck dissection   Past Surgical History  Procedure Date  . Appendectomy   . Lumbar laminectomy   . Tonsillectomy   . Bilateral ganglionectomies   . Radical right neck dissection 2008  . Pci-rfa renal cell (tcc) cancer 2009  . Cholecystectomy dec. 2009   Family History  Problem Relation Age of Onset  . Heart failure Father   . Kidney failure Father   . Other Father     Renal failure/ CHF  . Heart failure Mother   . Other Mother     CHF  . Colon cancer Neg Hx   . Prostate cancer Neg Hx   . Cancer Neg Hx     colon or prostate  . Diabetes Neg Hx    History   Social History  . Marital Status: Married    Spouse Name: N/A    Number of Children: 2  . Years of Education: N/A   Occupational History  . minister    Social History Main Topics  . Smoking status: Not on file  . Smokeless tobacco: Not on file  . Alcohol Use:   . Drug Use:   . Sexually Active:    Other Topics Concern  . Not on file   Social History Narrative   Father deceased @ 89,Mother deceased @ 31. [employer] college - business;[employer]. Married -62-17 yrs./divorced;married '83. 1 son-'63;1 daughter '64; 1  stepson '66; 1 grandchild.       Review of Systems Review of Systems  Constitutional:  Negative for fever, chills, activity change and unexpected weight change.  HEENT:  Negative for hearing loss, ear pain, congestion, neck stiffness and postnasal drip. Negative for sore throat or swallowing problems. Negative for dental complaints.   Eyes: Negative for vision loss or change in visual acuity.  Respiratory: Negative for chest tightness and wheezing.   Cardiovascular: Negative for chest pain and palpitation. No decreased exercise tolerance Gastrointestinal: No change in bowel habit. No bloating or gas. No reflux or indigestion Genitourinary: Negative for urgency, frequency, flank pain and difficulty urinating.  Musculoskeletal: Negative for myalgias, arthralgias and gait problem. Positive for back pain Neurological: Negative for dizziness, tremors, weakness and headaches.  Hematological: Negative for adenopathy.  Psychiatric/Behavioral: Negative for behavioral problems and dysphoric mood.       Objective:   Physical Exam Vitals noted - normal Gen'l - WNWD white male in no distress HEENT - right neck with changes from radical dissection; C&S clear Pulmonary - normal respirations. Cor - RRR MSK - Back exam: normal stand; normal flex to greater  than 100 degrees; normal gait; normal toe/heel walk; normal step up to exam table; normal SLR sitting; normal DTRs at the patellar tendons; normal sensation to light touch, pin-prick and deep vibratory stimulus; able to move supine to sitting witout assistance. Positive for tenderness in the right paravertebral soft tissue L3-4 level.         Assessment & Plan:  Low back pain - patient with a non-radicular exam. Suspect a muscle strain injury   Plan - L-S spine films             otc NSAID of choice - GI precautions given.             Heat and massage             Smart lifitng in the future.   Addendum: *RADIOLOGY REPORT*  Clinical Data:  Right-sided low back pain  LUMBAR SPINE - 2-3 VIEW  Comparison: CT abdomen/pelvis dated 09/25/2009  Findings: There are five lumbar-type vertebral bodies.  No evidence of fracture or dislocation.  Multilevel degenerative changes of the lower lumbar spine, most  prominent at L3-4 and L5-S1.  Cholecystectomy clips.  Vascular calcifications.  IMPRESSION:  No fracture or dislocation is seen.  Multilevel degenerative changes.  Original Report Authenticated By: Charline Bills, M.D.

## 2010-07-25 ENCOUNTER — Telehealth: Payer: Self-pay | Admitting: Internal Medicine

## 2010-07-25 NOTE — Telephone Encounter (Signed)
Informed pt of results.

## 2010-07-25 NOTE — Telephone Encounter (Signed)
Please call: Lumbar spine films without evidence of disk disease, just arthritic changes.

## 2010-07-30 ENCOUNTER — Telehealth: Payer: Self-pay | Admitting: *Deleted

## 2010-07-30 NOTE — Telephone Encounter (Signed)
Someone called for pt req MD call - "he can barely walk"

## 2010-07-30 NOTE — Telephone Encounter (Signed)
Called patient. He has had an exacerbation of his pain. No distinctive radicular symptoms.  Plan - ibuprofen and APAP            For persistent severe pain in the AM call for same day appointment

## 2010-10-22 LAB — BUN: BUN: 16

## 2010-10-22 LAB — CREATININE, SERUM: GFR calc non Af Amer: >60

## 2010-10-25 LAB — LIPASE, BLOOD: Lipase: 27 U/L (ref 11–59)

## 2010-10-25 LAB — CBC
Hemoglobin: 13.2 g/dL (ref 13.0–17.0)
MCHC: 33.4 g/dL (ref 30.0–36.0)
MCHC: 33.4 g/dL (ref 30.0–36.0)
Platelets: 172 10*3/uL (ref 150–400)
RBC: 4.04 MIL/uL — ABNORMAL LOW (ref 4.22–5.81)
RDW: 13.5 % (ref 11.5–15.5)
WBC: 4.9 10*3/uL (ref 4.0–10.5)

## 2010-10-25 LAB — COMPREHENSIVE METABOLIC PANEL
ALT: 13 U/L (ref 0–53)
ALT: 20 U/L (ref 0–53)
AST: 14 U/L (ref 0–37)
Albumin: 3.1 g/dL — ABNORMAL LOW (ref 3.5–5.2)
Alkaline Phosphatase: 62 U/L (ref 39–117)
Alkaline Phosphatase: 67 U/L (ref 39–117)
CO2: 26 mEq/L (ref 19–32)
Calcium: 8.8 mg/dL (ref 8.4–10.5)
Calcium: 9.1 mg/dL (ref 8.4–10.5)
Chloride: 104 mEq/L (ref 96–112)
GFR calc Af Amer: >60 mL/min (ref >60)
GFR calc non Af Amer: >60 mL/min (ref >60)
Glucose, Bld: 102 mg/dL — ABNORMAL HIGH (ref 70–99)
Potassium: 3.9 mEq/L (ref 3.5–5.1)
Potassium: 4.5 mEq/L (ref 3.5–5.1)
Sodium: 138 mEq/L (ref 135–145)
Sodium: 139 mEq/L (ref 135–145)
Total Bilirubin: 0.8 mg/dL (ref 0.3–1.2)
Total Protein: 5.5 g/dL — ABNORMAL LOW (ref 6.0–8.3)

## 2011-01-21 HISTORY — PX: SHOULDER ARTHROSCOPY: SHX128

## 2011-02-11 ENCOUNTER — Telehealth: Payer: Self-pay | Admitting: *Deleted

## 2011-02-11 MED ORDER — DIAZEPAM 5 MG PO TABS: 5.0000 mg | ORAL_TABLET | Freq: Two times a day (BID) | ORAL | Status: DC

## 2011-02-11 NOTE — Telephone Encounter (Signed)
Refill request diazepam 5mg . #120 with 5 refills last refilled on 5.23.12. OK to refill?

## 2011-02-11 NOTE — Telephone Encounter (Signed)
Ok for refill x 5 

## 2011-02-11 NOTE — Telephone Encounter (Signed)
Phoned in refill.  

## 2011-10-06 ENCOUNTER — Other Ambulatory Visit: Payer: Self-pay | Admitting: Internal Medicine

## 2011-10-06 NOTE — Telephone Encounter (Signed)
Patient reqeust refill on valium. Last office visit 07/2010. Please advise

## 2011-10-28 ENCOUNTER — Other Ambulatory Visit: Payer: Self-pay | Admitting: Orthopedic Surgery

## 2011-10-28 DIAGNOSIS — M25512 Pain in left shoulder: Secondary | ICD-10-CM

## 2011-10-29 ENCOUNTER — Ambulatory Visit
Admission: RE | Admit: 2011-10-29 | Discharge: 2011-10-29 | Disposition: A | Discharge status: Home / Self Care | Payer: Medicare Other | Source: Ambulatory Visit | Attending: Orthopedic Surgery | Admitting: Orthopedic Surgery

## 2011-10-29 DIAGNOSIS — M25512 Pain in left shoulder: Secondary | ICD-10-CM

## 2011-10-30 ENCOUNTER — Other Ambulatory Visit: Payer: Medicare Other

## 2011-12-31 ENCOUNTER — Encounter: Payer: Medicare Other | Admitting: Internal Medicine

## 2012-01-08 ENCOUNTER — Encounter: Payer: Self-pay | Admitting: Internal Medicine

## 2012-01-08 ENCOUNTER — Ambulatory Visit (INDEPENDENT_AMBULATORY_CARE_PROVIDER_SITE_OTHER): Payer: Medicare Other | Admitting: Internal Medicine

## 2012-01-08 VITALS — BP 120/62 | HR 68 | Temp 98.0°F | Resp 10 | Wt 238.0 lb

## 2012-01-08 DIAGNOSIS — R0600 Dyspnea, unspecified: Secondary | ICD-10-CM

## 2012-01-08 DIAGNOSIS — R0601 Orthopnea: Secondary | ICD-10-CM

## 2012-01-08 DIAGNOSIS — K117 Disturbances of salivary secretion: Secondary | ICD-10-CM

## 2012-01-08 NOTE — Patient Instructions (Addendum)
Xerostomia - secondary to treatment and death of the salivary glands. Plan orajel or thera-mints which you will need to use every several hours. For very dry eyes use Lacri-lube or similar product.  Pulmonary - increased shortness of breath. Best to get information about total lung function Plan Pulmonary function testing - you will be notified.   Nasal congestion - use nasal saline spray during the day and at night use a petrolatum type product, e.g. vaseline or mentholatum.  Have a peaceful holiday.

## 2012-01-13 NOTE — Assessment & Plan Note (Signed)
Xerostomia - secondary to treatment and death of the salivary glands. Plan orajel or thera-mints which you will need to use every several hours. For very dry eyes use Lacri-lube or similar product.

## 2012-01-13 NOTE — Progress Notes (Signed)
Subjective:    Patient ID: Samuel Hurley, male    DOB: [birth year], 77 y.o.   MRN: [id number]  HPI Rev [name] presents for discussion and management of Xerostomia. This has been a refractory problem since his treatment for head and neck cancer.  Reviewed oncology notes from [facility] where this problem has also been addressed. Salagen failed, thera-mints have failed. He continues to have very symptomatic xerostomia, especially at night. He has not had major dentition problems and does not have active dentition issues today.  Past Medical History  Diagnosis Date  . GERD (gastroesophageal reflux disease)   . Hyperlipidemia   . OSA (obstructive sleep apnea)   . BPH (benign prostatic hypertrophy)   . Kidney disease     TCC  . Xerostomia   . Pharynx cancer     squamous cell stage 4,s/p XRT,chemo, neck dissection   Past Surgical History  Procedure Date  . Appendectomy   . Lumbar laminectomy   . Tonsillectomy   . Bilateral ganglionectomies   . Radical right neck dissection 2008  . Pci-rfa renal cell (tcc) cancer 2009  . Cholecystectomy dec. 2009   Family History  Problem Relation Age of Onset  . Heart failure Father   . Kidney failure Father   . Other Father     Renal failure/ CHF  . Heart failure Mother   . Other Mother     CHF  . Colon cancer Neg Hx   . Prostate cancer Neg Hx   . Cancer Neg Hx     colon or prostate  . Diabetes Neg Hx    History   Social History  . Marital Status: Married    Spouse Name: N/A    Number of Children: 2  . Years of Education: N/A   Occupational History  . minister    Social History Main Topics  . Smoking status: Never Smoker   . Smokeless tobacco: Never Used  . Alcohol Use: No  . Drug Use: No  . Sexually Active: Not on file   Other Topics Concern  . Not on file   Social History Narrative   Father deceased @ 89,Mother deceased @ 38. [employer] college - business;[employer]. Married -62-17 yrs./divorced;married  '83. 1 son-'63;1 daughter '64; 1 stepson '66; 1 grandchild.    Current Outpatient Prescriptions on File Prior to Visit  Medication Sig Dispense Refill  . chlorhexidine (PERIDEX) 0.12 % solution Use as directed 15 mLs in the mouth or throat 2 (two) times daily. 1/2 oz rinse and spit.       . cyanocobalamin 2000 MCG tablet Take 2,000 mcg by mouth daily.        . finasteride (PROSCAR) 5 MG tablet Take 5 mg by mouth daily.        . Ginkgo Biloba EXTR 60 mg by Does not apply route 2 (two) times daily.        Marland Kitchen levothyroxine (SYNTHROID, LEVOTHROID) 75 MCG tablet Take 75 mcg by mouth daily.        Marland Kitchen terazosin (HYTRIN) 1 MG capsule Take 1 mg by mouth daily.            Review of Systems System review is negative for any constitutional, cardiac, pulmonary, GI or neuro symptoms or complaints other than as described in the HPI.     Objective:   Physical Exam Filed Vitals:   01/08/12 1534  BP: 120/62  Pulse: 68  Temp: 98 F (36.7 C)  Resp: 10  Gen'l- heavyset white man in no acute distress HEENT - oropharynx without visible dental damage, no lesions buccal membranes Cor- RRR PUlm - normal respirations Neuro - A&O x 3       Assessment & Plan:  1. Pulmonary - increased shortness of breath. Best to get information about total lung function Plan Pulmonary function testing - you will be notified.

## 2012-01-30 ENCOUNTER — Ambulatory Visit (INDEPENDENT_AMBULATORY_CARE_PROVIDER_SITE_OTHER): Payer: Medicare Other | Admitting: Internal Medicine

## 2012-01-30 DIAGNOSIS — R0601 Orthopnea: Secondary | ICD-10-CM

## 2012-01-30 DIAGNOSIS — R0600 Dyspnea, unspecified: Secondary | ICD-10-CM

## 2012-01-30 LAB — PULMONARY FUNCTION TEST

## 2012-01-30 NOTE — Progress Notes (Signed)
PFT done today. 

## 2012-02-03 ENCOUNTER — Telehealth: Payer: Self-pay | Admitting: *Deleted

## 2012-02-03 NOTE — Telephone Encounter (Signed)
PATIENT CALLED REQUEST RESULTS OF  THE PFT HE HAD DONE 01/30/2012 AT PULMONOLOGY. CB #[phone]

## 2012-02-03 NOTE — Telephone Encounter (Signed)
Patient called to notify report of PFT not available at this time. Will be notified when Dr. Debby Bud gets this.

## 2012-02-03 NOTE — Telephone Encounter (Signed)
Report not available to me at this time - usually takes more than 2 working days for a report to be completed and released

## 2012-02-05 ENCOUNTER — Telehealth: Payer: Self-pay | Admitting: Internal Medicine

## 2012-02-05 NOTE — Telephone Encounter (Signed)
Please call patient: PFT reveals mild restrictive lung capacity, mild COPD with a good response to bronchodilator therapy. No evidence of emphysema.  Happy to review further at an office visit.

## 2012-02-10 ENCOUNTER — Telehealth: Payer: Self-pay | Admitting: *Deleted

## 2012-02-10 MED ORDER — FORMOTEROL FUMARATE 12 MCG IN CAPS: 12.0000 ug | ORAL_CAPSULE | Freq: Two times a day (BID) | RESPIRATORY_TRACT | Status: DC

## 2012-02-10 NOTE — Telephone Encounter (Signed)
Called pt to let him know, per Dr Debby Bud, that PFT reveals mild restrictive lung capacity, mild COPD with a good response to bronchodilator therapy. No evidence of emphysema. Happy to review further at an office visit. Pt states can Dr Debby Bud go ahead and prescribe him a bronchodilator?

## 2012-02-10 NOTE — Telephone Encounter (Signed)
OK - foradil 1 inhalation twice a day on a regular basis - a maintenance drug.

## 2012-02-11 ENCOUNTER — Telehealth: Payer: Self-pay | Admitting: *Deleted

## 2012-02-11 NOTE — Telephone Encounter (Signed)
Called pt and let him know that an bronchodilator, foradil has been called in for him at his pharmacy. He is to take 1 inhalation twice daily as a maintenance medication. If he has any problems, please give [place] a call.

## 2012-03-01 HISTORY — PX: SEPTOPLASTY: SUR1290

## 2012-03-06 ENCOUNTER — Other Ambulatory Visit: Payer: Self-pay

## 2012-04-01 ENCOUNTER — Encounter: Payer: Self-pay | Admitting: Internal Medicine

## 2012-04-01 ENCOUNTER — Ambulatory Visit (INDEPENDENT_AMBULATORY_CARE_PROVIDER_SITE_OTHER): Payer: Medicare Other | Admitting: Internal Medicine

## 2012-04-01 VITALS — BP 116/70 | HR 88 | Temp 98.0°F | Resp 12 | Wt 237.0 lb

## 2012-04-01 DIAGNOSIS — K117 Disturbances of salivary secretion: Secondary | ICD-10-CM

## 2012-04-01 DIAGNOSIS — Z23 Encounter for immunization: Secondary | ICD-10-CM

## 2012-04-01 DIAGNOSIS — R0600 Dyspnea, unspecified: Secondary | ICD-10-CM | POA: Insufficient documentation

## 2012-04-01 DIAGNOSIS — J984 Other disorders of lung: Secondary | ICD-10-CM

## 2012-04-01 MED ORDER — TETANUS-DIPHTH-ACELL PERTUSSIS 5-2.5-18.5 LF-MCG/0.5 IM SUSP: 0.5000 mL | Freq: Once | INTRAMUSCULAR | Status: DC

## 2012-04-01 MED ORDER — PNEUMOCOCCAL VAC POLYVALENT 25 MCG/0.5ML IJ INJ: 0.5000 mL | INJECTION | INTRAMUSCULAR | Status: AC

## 2012-04-01 NOTE — Progress Notes (Signed)
Subjective:     Patient ID: Samuel Hurley, male   DOB: 10-21-1934, 78 y.o.   MRN: [id number]  HPI Samuel Hurley is a 78 yo man with hx of pharynx cancer s/p radiation therapy that presents for explanation of recent pulmonary function testing.  Currently pt is upset that he has not been able to sing the way he used to d/t poor respiratory function, he feels that his oncology team did not warn him appropriately about the risks of radiation.  He would like to know if his pulmonary disease is reversible and if he is, "Screwed for life."  Pt has no current complaints at this time.  Past Medical History  Diagnosis Date  . GERD (gastroesophageal reflux disease)   . Hyperlipidemia   . OSA (obstructive sleep apnea)   . BPH (benign prostatic hypertrophy)   . Kidney disease     TCC  . Xerostomia   . Pharynx cancer     squamous cell stage 4,s/p XRT,chemo, neck dissection   Past Surgical History  Procedure Laterality Date  . Appendectomy    . Lumbar laminectomy    . Tonsillectomy    . Bilateral ganglionectomies    . Radical right neck dissection  2008  . Pci-rfa renal cell (tcc) cancer  2009  . Cholecystectomy  dec. 2009  . Septoplasty      with double turbinectomy   Family History  Problem Relation Age of Onset  . Heart failure Father   . Kidney failure Father   . Other Father     Renal failure/ CHF  . Heart failure Mother   . Other Mother     CHF  . Colon cancer Neg Hx   . Prostate cancer Neg Hx   . Cancer Neg Hx     colon or prostate  . Diabetes Neg Hx    History   Social History  . Marital Status: Married    Spouse Name: N/A    Number of Children: 2  . Years of Education: N/A   Occupational History  . minister    Social History Main Topics  . Smoking status: Never Smoker   . Smokeless tobacco: Never Used  . Alcohol Use: No  . Drug Use: No  . Sexually Active: Not on file   Other Topics Concern  . Not on file   Social History Narrative   Father deceased @  89,Mother deceased @ 88. [employer] college - business;[employer]. Married -62-17 yrs./divorced;married '83. 1 son-'63;1 daughter '64; 1 stepson '66; 1 grandchild.    Current Outpatient Prescriptions on File Prior to Visit  Medication Sig Dispense Refill  . chlorhexidine (PERIDEX) 0.12 % solution Use as directed 15 mLs in the mouth or throat 2 (two) times daily. 1/2 oz rinse and spit.       . cyanocobalamin 2000 MCG tablet Take 2,000 mcg by mouth daily.        . finasteride (PROSCAR) 5 MG tablet Take 5 mg by mouth daily.        . formoterol (FORADIL AEROLIZER) 12 MCG capsule for inhaler Place 1 capsule (12 mcg total) into inhaler and inhale 2 (two) times daily.  60 capsule  12  . Ginkgo Biloba EXTR 60 mg by Does not apply route 2 (two) times daily.        Marland Kitchen levothyroxine (SYNTHROID, LEVOTHROID) 75 MCG tablet Take 75 mcg by mouth daily.        Marland Kitchen terazosin (HYTRIN) 1 MG capsule Take 1  mg by mouth daily.         No current facility-administered medications on file prior to visit.   Review of Systems  Constitutional: Negative for fever and chills.  HENT: Positive for rhinorrhea.   Respiratory: Positive for cough and shortness of breath (with exertion and singing).   Cardiovascular: Negative for chest pain.  Gastrointestinal: Negative for nausea, vomiting, abdominal pain and diarrhea.  Genitourinary: Negative for difficulty urinating.  Neurological: Negative for headaches.      Objective:   Physical Exam General: elderly man resting comfortably in chair in no acute distress HEENT: [place]/AT, EOMI, nasal exam deferred, evidence of past throat surgery apparent on inspection CV: RRR, no m/r/g appreciated Pulm: pt coughing throughout interview/exam Abd: obese, +BS, soft, non-tender, non-distended, no HSM Extremities: 2+ radial and posterior tibialis pulses Neuro: no CN deficits, 5/5 grip strength, normal gait Psych: mood/affect appropriate for interaction  PFTs: Mild restrictive disease,  mild COPD, diffusion coefficient appropriate for pt age.     Assessment/Plan:   Benzodiazapine use: pt unclear why he is taking diazepam (5 mg 2x's daily), pt is not experiencing anxiety regularly - would like the pt to attempt a weaning off of benzodiazapines - for 1 week take only 1 pill a day - after one week of 1 pill a day reduce use to one pill every other day for 7-10 days - if no increase in anxiety with reduced dose can completely stop taking diazepam after 2 weeks - if there is an increase in anxiety please call and we can discuss other options for anxiolytic therapy besides benzodiazapines  I have personally interviewed, conducted a limited exam, reviewed the patient's recent PFT and discussed treatment. I agree with the assessment and plan as documented by Mr. Lucretia Roers, MS III

## 2012-04-01 NOTE — Assessment & Plan Note (Signed)
Assessment: pt continues to complain of dry mouth despite recent addition of cevimaline to drug therapy and use of lozenges and water. Plan: - glycerin swabs

## 2012-04-01 NOTE — Patient Instructions (Addendum)
1. Lungs - mild restrictive lung disease = decreased forced vital capacity. Also, small airway obstructive disease - which does respond to bronchodilator. Plan Continue Fomoterol bid.   Will refer you to pulmonary rehab for improved quality of life and function.  2. Valium use - may not need this medication which carries real risks: falls and fractures, pseudo-dementia and habituation. Plan  go to one tablet daily for 1 week  Then take one every other day for 7-10 days, then stop  If you find that there is a problem with anxiety we can consider other, better medications.   3. Xerostomia - dry mouth: ask your pharmacist if they can order glycerin swabs that you can use for comfort.

## 2012-04-01 NOTE — Assessment & Plan Note (Signed)
Assessment: pt complaining of difficulty singing, and exercise intolerance. Plan: - continue formoterol - referral for pulmonary rehab

## 2012-04-21 ENCOUNTER — Telehealth: Payer: Self-pay | Admitting: Internal Medicine

## 2012-04-21 DIAGNOSIS — J984 Other disorders of lung: Secondary | ICD-10-CM

## 2012-04-21 DIAGNOSIS — G4730 Sleep apnea, unspecified: Secondary | ICD-10-CM

## 2012-04-21 DIAGNOSIS — J90 Pleural effusion, not elsewhere classified: Secondary | ICD-10-CM

## 2012-04-21 NOTE — Telephone Encounter (Signed)
Pt req referral for Pulmonary. Please advise.

## 2012-04-21 NOTE — Telephone Encounter (Signed)
Referral put in.

## 2012-05-04 ENCOUNTER — Ambulatory Visit (INDEPENDENT_AMBULATORY_CARE_PROVIDER_SITE_OTHER): Payer: Medicare Other | Admitting: Pulmonary Disease

## 2012-05-04 ENCOUNTER — Encounter: Payer: Self-pay | Admitting: Pulmonary Disease

## 2012-05-04 VITALS — BP 124/82 | HR 80 | Temp 98.4°F | Ht 72.0 in | Wt 227.0 lb

## 2012-05-04 DIAGNOSIS — J984 Other disorders of lung: Secondary | ICD-10-CM

## 2012-05-04 NOTE — Assessment & Plan Note (Addendum)
Your lung function is OK - PFT '14 - mild reversible obstruction in small airways - not impressive Mild restriction does not explain his degree of dyspnea. Does not seem to have any neurological cause of dyspnea. No interstitial lung disease as noted on exam or imaging  Your breathing issues may be related to reflux or aspiration Trial of dexilant daily -acid suppressant - take instead of prilosec Swallowing test Take symbicort 160 2 puffs twice daily - instead of formoterol - RINSE mouth after use CXR for completion

## 2012-05-04 NOTE — Patient Instructions (Addendum)
Your lung function is OK Your breathing issues may be related to reflux or aspiration Trial of dexilant daily -acid suppressant - take instead of prilosec Swallowing test Take symbicort 160 2 puffs twice daily - instead of formoterol - RINSE mouth after use

## 2012-05-04 NOTE — Progress Notes (Signed)
Subjective:    Patient ID: Samuel Messing., male    DOB: [birth year], 78 y.o.   MRN: [id number]  HPI PCP - Norins  78 year old never smoker referred for evaluation of dyspnea and exertion for the last 4 months. He gets up and walking the dogs around the yard. 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 His survival function has been limited. His dyspnea bruits back to 2008 but has worsened in the last 6 months. Does not associate or orthopnea, paroxysmal nocturnal dyspnea, chest pain or pedal edema. Reports that his problem is with inspiration. 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 This did affect his voice. 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 A small hiatal hernia was noted on his imaging. He underwent septoplasty in February 20 14th at Dr. Ezzard Standing with limited relief. CT in September 2011 had shown the bilateral maxillary sinusitis. Pulmonary function testing showed FEV1 of 80%, FVC was 72%, smaller was were decreased at 68% and improved to 102% with bronchodilator. Diffusion capacity was normal. He was started on Foradil. Aerolizer and this has not helped him much Chest x-ray in may 2012 did not show any infiltrates or effusions CT neck, chest and abdomen in September 2011 did not show any evidence of metastatic disease. He denies coughing spells after eating or drinking but is concerned about aspiration. His report frequent heartburn especially at night. He wonders if he may have narrowing of his trachea   Past Medical History  Diagnosis Date  . GERD (gastroesophageal reflux disease)   . Hyperlipidemia   . OSA (obstructive sleep apnea)   . BPH (benign prostatic hypertrophy)   . Kidney  disease     TCC  . Xerostomia   . Pharynx cancer     squamous cell stage 4,s/p XRT,chemo, neck dissection    Past Surgical History  Procedure Laterality Date  . Appendectomy    . Lumbar laminectomy    . Tonsillectomy    . Bilateral ganglionectomies    . Radical right neck dissection  2008  . Pci-rfa renal cell (tcc) cancer  2009  . Cholecystectomy  dec. 2009  . Septoplasty  03/01/12    with double turbinectomy    Allergies  Allergen Reactions  . Erythromycin     REACTION: Diarrhea  . Tetracycline    History   Social History  . Marital Status: Married    Spouse Name: N/A    Number of Children: 2  . Years of Education: N/A   Occupational History  . minister    Social History Main Topics  . Smoking status: Never Smoker   . Smokeless tobacco: Never Used  . Alcohol Use: No  . Drug Use: No  . Sexually Active: Not on file   Other Topics Concern  . Not on file   Social History Narrative   Father deceased @ 89,Mother deceased @ 62. [employer] college - business;[employer]. Married -62-17 yrs./divorced;married '83. 1 son-'63;1 daughter '64; 1 stepson '66; 1 grandchild.    Family History  Problem Relation Age of Onset  . Heart failure Father   . Kidney failure Father   . Other Father     Renal failure/ CHF  .  Heart failure Mother   . Other Mother     CHF  . Colon cancer Neg Hx   . Prostate cancer Neg Hx   . Cancer Neg Hx     colon or prostate  . Diabetes Neg Hx      Review of Systems  Constitutional: Negative for fever, chills, diaphoresis, activity change, appetite change, fatigue and unexpected weight change.  HENT: Negative for hearing loss, ear pain, nosebleeds, congestion, sore throat, facial swelling, rhinorrhea, sneezing, mouth sores, trouble swallowing, neck pain, neck stiffness, dental problem, voice change, postnasal drip, sinus pressure, tinnitus and ear discharge.   Eyes: Negative for photophobia, discharge, itching and visual disturbance.   Respiratory: Positive for cough and shortness of breath. Negative for apnea, choking, chest tightness, wheezing and stridor.   Cardiovascular: Negative for chest pain, palpitations and leg swelling.  Gastrointestinal: Positive for abdominal pain. Negative for nausea, vomiting, constipation, blood in stool and abdominal distention.  Genitourinary: Negative for dysuria, urgency, frequency, hematuria, flank pain, decreased urine volume and difficulty urinating.  Musculoskeletal: Negative for myalgias, back pain, joint swelling, arthralgias and gait problem.  Skin: Negative for color change, pallor and rash.  Neurological: Negative for dizziness, tremors, seizures, syncope, speech difficulty, weakness, light-headedness, numbness and headaches.  Hematological: Negative for adenopathy. Does not bruise/bleed easily.  Psychiatric/Behavioral: Negative for confusion, sleep disturbance and agitation. The patient is not nervous/anxious.        Objective:   Physical Exam  Gen. Pleasant, well-nourished, in no distress, normal affect ENT - no lesions, no post nasal drip, Rt neck scar & deformity Neck: No JVD, no thyromegaly, no carotid bruits Lungs: no use of accessory muscles, no dullness to percussion, clear without rales or rhonchi  Cardiovascular: Rhythm regular, heart sounds  normal, no murmurs or gallops, no peripheral edema Abdomen: soft and non-tender, no hepatosplenomegaly, BS normal. Musculoskeletal: No deformities, no cyanosis or clubbing Neuro:  alert, non focal       Assessment & Plan:

## 2012-05-05 ENCOUNTER — Other Ambulatory Visit (HOSPITAL_COMMUNITY): Payer: Self-pay | Admitting: Pulmonary Disease

## 2012-05-05 DIAGNOSIS — C140 Malignant neoplasm of pharynx, unspecified: Secondary | ICD-10-CM

## 2012-05-05 DIAGNOSIS — J984 Other disorders of lung: Secondary | ICD-10-CM

## 2012-05-06 ENCOUNTER — Ambulatory Visit (HOSPITAL_COMMUNITY)
Admission: RE | Admit: 2012-05-06 | Discharge: 2012-05-06 | Disposition: A | Discharge status: Home / Self Care | Payer: Medicare Other | Source: Ambulatory Visit | Attending: Pulmonary Disease | Admitting: Pulmonary Disease

## 2012-05-06 ENCOUNTER — Observation Stay (HOSPITAL_COMMUNITY)
Admission: RE | Admit: 2012-05-06 | Discharge: 2012-05-06 | Disposition: A | Discharge status: Home / Self Care | Payer: Medicare Other | Source: Ambulatory Visit | Attending: Pulmonary Disease | Admitting: Pulmonary Disease

## 2012-05-06 DIAGNOSIS — K219 Gastro-esophageal reflux disease without esophagitis: Secondary | ICD-10-CM | POA: Insufficient documentation

## 2012-05-06 DIAGNOSIS — C140 Malignant neoplasm of pharynx, unspecified: Secondary | ICD-10-CM | POA: Insufficient documentation

## 2012-05-06 DIAGNOSIS — R0989 Other specified symptoms and signs involving the circulatory and respiratory systems: Secondary | ICD-10-CM | POA: Insufficient documentation

## 2012-05-06 DIAGNOSIS — R059 Cough, unspecified: Secondary | ICD-10-CM | POA: Insufficient documentation

## 2012-05-06 DIAGNOSIS — J984 Other disorders of lung: Secondary | ICD-10-CM | POA: Insufficient documentation

## 2012-05-06 DIAGNOSIS — R05 Cough: Secondary | ICD-10-CM | POA: Insufficient documentation

## 2012-05-06 DIAGNOSIS — R0609 Other forms of dyspnea: Secondary | ICD-10-CM | POA: Insufficient documentation

## 2012-05-06 NOTE — Procedures (Signed)
Objective Swallowing Evaluation: Modified Barium Swallowing Study  Patient Details  Name: Samuel Hurley. MRN: [id number] Date of Birth: 12-Sep-1934  Today's Date: 05/06/2012 Time: 4098-1191 SLP Time Calculation (min): 33 min  Past Medical History:  Past Medical History  Diagnosis Date  . GERD (gastroesophageal reflux disease)   . Hyperlipidemia   . OSA (obstructive sleep apnea)   . BPH (benign prostatic hypertrophy)   . Kidney disease     TCC  . Xerostomia   . Pharynx cancer     squamous cell stage 4,s/p XRT,chemo, neck dissection   Past Surgical History:  Past Surgical History  Procedure Laterality Date  . Appendectomy    . Lumbar laminectomy    . Tonsillectomy    . Bilateral ganglionectomies    . Radical right neck dissection  2008  . Pci-rfa renal cell (tcc) cancer  2009  . Cholecystectomy  dec. 2009  . Septoplasty  03/01/12    with double turbinectomy   HPI:  78 year old arriving for an outpatient MBS, also recently referred to pulmonologist  for evaluation of dyspnea and exertion for the last 4 months. He denies any pna. Pulmonologist does not report any overt cause for dyspnea on exam, reflux or aspiration suspected. He was started on new reflux medication. He reports he  is afraid he is aspirating due to frequent coughing spells. He reports that certain dry foods "get stuck" and he has to cough them up. He also reports xerostomia. PMH: Underwent radical right neck dissection in 2008 for oropharyngeal cancer and subsequently received chemotherapy and radiation. This did affect his voice. 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 A small hiatal hernia was noted on his imaging. He underwent septoplasty in February 20 14th at Dr. Ezzard Standing with limited relief.       Assessment / Plan / Recommendation Clinical Impression  Dysphagia Diagnosis: Suspected primary esophageal dysphagia Clinical impression: Pt presents with normal oral and oropharyngeal  function in setting of history of radical right neck dissection following oropharyngeal cancer. There were no instances of penetration or aspiration. Strength was [facility]. Despite these finding pt demonstrated persistent coughing throughout the exam. Biofeedback provided to educate pt that nothing was in his airway. An esopahgeal sweep did reveal slow transit and upward surging of statsis, particularly with solids, at the mid to proximal esophagus. There was no evidence of back flow from the GE junction or to the pharynx. No radiologist was present to confirm esophageal findings. Suspect however, the pts cough is related to esophageal stasis as airway and pharynx remained clear. Suggest f/u with GI or esophageal w/u to further examine function. SLP offered esophageal precautions and strategies which pt reported he already does. No SLP f/u needed at this time, pt may continue current diet.     Treatment Recommendation  No treatment recommended at this time    Diet Recommendation Regular;Thin liquid   Liquid Administration via: Cup;Straw Medication Administration: Whole meds with liquid Supervision: Patient able to self feed Compensations: Follow solids with liquid Postural Changes and/or Swallow Maneuvers: Seated upright 90 degrees;Upright 30-60 min after meal    Other  Recommendations Recommended Consults: Consider GI evaluation;Consider esophageal assessment   Follow Up Recommendations  None    Frequency and Duration        Pertinent Vitals/Pain NA    SLP Swallow Goals     General HPI: 78 year old arriving for an outpatient MBS, also recently referred to pulmonologist  for evaluation of dyspnea and  exertion for the last 4 months. He denies any pna. Pulmonologist does not report any overt cause for dyspnea on exam, reflux or aspiration suspected. He was started on new reflux medication. He reports he  is afraid he is aspirating due to frequent coughing spells. He reports that certain dry foods  "get stuck" and he has to cough them up. He also repots xerostomia. PMH: Underwent radical right neck dissection in 2008 for oropharyngeal cancer and subsequently received chemotherapy and radiation. This did affect his voice. 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 A small hiatal hernia was noted on his imaging. He underwent septoplasty in February 20 14th at Dr. Ezzard Standing with limited relief.   Type of Study: Modified Barium Swallowing Study Reason for Referral: Objectively evaluate swallowing function Diet Prior to this Study: Regular;Thin liquids Temperature Spikes Noted: N/A Respiratory Status: Room air History of Recent Intubation: No Behavior/Cognition: Alert;Cooperative;Pleasant mood Oral Cavity - Dentition: Adequate natural dentition Oral Motor / Sensory Function: Within functional limits Self-Feeding Abilities: Able to feed self Patient Positioning: Upright in chair Baseline Vocal Quality: Clear Volitional Cough: Strong Volitional Swallow: Able to elicit Anatomy: Other (Comment) (oral anatomy WNL, Loss of neck tissue on right) Pharyngeal Secretions: Not observed secondary MBS    Reason for Referral Objectively evaluate swallowing function   Oral Phase Oral Preparation/Oral Phase Oral Phase: WFL   Pharyngeal Phase Pharyngeal Phase Pharyngeal Phase: Within functional limits  Cervical Esophageal Phase    GO    Cervical Esophageal Phase Cervical Esophageal Phase: [facility]    Functional Assessment Tool Used: clinical judgement Functional Limitations: Swallowing Swallow Current Status (Z6109): At least 1 percent but less than 20 percent impaired, limited or restricted Swallow Goal Status [phone]): At least 1 percent but less than 20 percent impaired, limited or restricted Swallow Discharge Status [phone]): At least 1 percent but less than 20 percent impaired, limited or restricted   [facility], [place] CCC-SLP [phone]  Claudine Mouton 05/06/2012, 1:55 PM

## 2012-05-11 ENCOUNTER — Telehealth: Payer: Self-pay | Admitting: Pulmonary Disease

## 2012-05-11 ENCOUNTER — Other Ambulatory Visit: Payer: Self-pay | Admitting: Pulmonary Disease

## 2012-05-11 DIAGNOSIS — K219 Gastro-esophageal reflux disease without esophagitis: Secondary | ICD-10-CM

## 2012-05-11 MED ORDER — DEXLANSOPRAZOLE 60 MG PO CPDR: 60.0000 mg | DELAYED_RELEASE_CAPSULE | Freq: Every day | ORAL | Status: DC

## 2012-05-11 NOTE — Telephone Encounter (Signed)
Per 4.15.14 ov note with RA: Patient Instructions    Your lung function is OK  Your breathing issues may be related to reflux or aspiration  Trial of dexilant daily -acid suppressant - take instead of prilosec  Swallowing test  Take symbicort 160 2 puffs twice daily - instead of formoterol - RINSE mouth after use    Pt returned call stating that dexilant has helped tremendously with his breathing  Requesting a rx be sent on this med > #90 please Rx sent to verified pharmacy Pt was to follow up in 4 weeks > appt scheduled for 5.22.14 w/ RA

## 2012-05-11 NOTE — Telephone Encounter (Signed)
lmomtcb x1 for pt 

## 2012-05-12 ENCOUNTER — Encounter: Payer: Self-pay | Admitting: *Deleted

## 2012-05-12 ENCOUNTER — Telehealth: Payer: Self-pay | Admitting: Pulmonary Disease

## 2012-05-12 ENCOUNTER — Encounter: Payer: Self-pay | Admitting: Gastroenterology

## 2012-05-12 MED ORDER — DEXLANSOPRAZOLE 60 MG PO CPDR: 60.0000 mg | DELAYED_RELEASE_CAPSULE | Freq: Every day | ORAL | Status: DC

## 2012-05-12 NOTE — Telephone Encounter (Signed)
Pt called back. He says he "can't wait for a call back at the end of the day- needs to have someone pick up samples of dexilant today asap". Samuel Hurley

## 2012-05-12 NOTE — Telephone Encounter (Signed)
Pt is here in lobby to wait to get samples. He says he will die tonight if no one helps him.

## 2012-05-12 NOTE — Telephone Encounter (Signed)
Dexilant 60 mg rx was sent to [facility].  Spoke with pt.  States she went to [facility] to pick this up but was our office will need to contact his insurance company.  He is requesting samples of dexilant until this can be taken care of.  I have given pt 1 sample (5 capsules) as this is all we have at this time.  Advised we will work on this and will let him know once we have received an answer from him insurance company.  He verbalized understanding and was very grateful of this.    I spoke with Mindy.  She doesn't have PA for this.   I called [place], spoke with [place].  Was advised the Dexilant does need a PA. Lawson Fiscal will refax PA form to triage. Will await fax.

## 2012-05-12 NOTE — Telephone Encounter (Signed)
Received Faxed PA request for Dexilant. I called the plan at [phone] to initiate PA as pt would like this taken care of soon. However, I was on hold > 20 minutes to initiate this PA.   Therefore, I initiated PA through [email or web address] Will route msg to Mindy to follow up on status.

## 2012-05-13 ENCOUNTER — Encounter: Payer: Self-pay | Admitting: Gastroenterology

## 2012-05-13 ENCOUNTER — Ambulatory Visit (INDEPENDENT_AMBULATORY_CARE_PROVIDER_SITE_OTHER): Payer: Medicare Other | Admitting: Gastroenterology

## 2012-05-13 ENCOUNTER — Other Ambulatory Visit (INDEPENDENT_AMBULATORY_CARE_PROVIDER_SITE_OTHER): Payer: Medicare Other

## 2012-05-13 VITALS — BP 138/80 | HR 84 | Ht 72.0 in | Wt 224.4 lb

## 2012-05-13 DIAGNOSIS — Z923 Personal history of irradiation: Secondary | ICD-10-CM

## 2012-05-13 DIAGNOSIS — K219 Gastro-esophageal reflux disease without esophagitis: Secondary | ICD-10-CM

## 2012-05-13 DIAGNOSIS — Z85810 Personal history of malignant neoplasm of tongue: Secondary | ICD-10-CM

## 2012-05-13 DIAGNOSIS — R1310 Dysphagia, unspecified: Secondary | ICD-10-CM

## 2012-05-13 DIAGNOSIS — Z860100 Personal history of colon polyps, unspecified: Secondary | ICD-10-CM

## 2012-05-13 DIAGNOSIS — K117 Disturbances of salivary secretion: Secondary | ICD-10-CM

## 2012-05-13 LAB — BASIC METABOLIC PANEL
CO2: 30 mEq/L (ref 19–32)
Chloride: 101 mEq/L (ref 96–112)
Creatinine, Ser: 1 mg/dL (ref 0.4–1.5)
GFR: 77.21 mL/min (ref >60.00)
Sodium: 138 mEq/L (ref 135–145)

## 2012-05-13 LAB — CBC WITH DIFFERENTIAL/PLATELET
Basophils Absolute: 0 10*3/uL (ref 0.0–0.1)
Basophils Relative: 0.6 % (ref 0.0–3.0)
Eosinophils Relative: 0.9 % (ref 0.0–5.0)
HCT: 37.5 % — ABNORMAL LOW (ref 39.0–52.0)
Hemoglobin: 12.7 g/dL — ABNORMAL LOW (ref 13.0–17.0)
Lymphocytes Relative: 9 % — ABNORMAL LOW (ref 12.0–46.0)
Monocytes Relative: 8.9 % (ref 3.0–12.0)
Neutro Abs: 5.5 10*3/uL (ref 1.4–7.7)
RBC: 4.02 Mil/uL — ABNORMAL LOW (ref 4.22–5.81)
WBC: 6.8 10*3/uL (ref 4.5–10.5)

## 2012-05-13 LAB — FERRITIN: Ferritin: 174 ng/mL (ref 22.0–322.0)

## 2012-05-13 LAB — HEPATIC FUNCTION PANEL
Albumin: 3.4 g/dL — ABNORMAL LOW (ref 3.5–5.2)
Total Protein: 7.1 g/dL (ref 6.0–8.3)

## 2012-05-13 LAB — FOLATE: Folate: 6.6 ng/mL (ref >5.9)

## 2012-05-13 LAB — VITAMIN B12: Vitamin B-12: >1500 pg/mL — ABNORMAL HIGH (ref 211–911)

## 2012-05-13 LAB — IBC PANEL: Saturation Ratios: 18.2 % — ABNORMAL LOW (ref 20.0–50.0)

## 2012-05-13 NOTE — Progress Notes (Signed)
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 Rebekah Chesterfield.  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  His surgeon is Dr. Narda Bonds.  Patient relates that he has had a" tightness" his right neck area over since his node dissection chemotherapy radiation.  Apparently he is followed closely by Dr. Ezzard Standing, and has had no evidence of recurrence.  Apparently he did not have to resect any part of his tongue.  He been doing fairly well except for mild acid reflux, and was on Prilosec 20 mg a day, but over the last several months his spit has dried up and caused him progressive solid food dysphagia.  He was placed on Cevimetine  30 mg 3 times a day as a muscinaric agent.  This is worse his acid reflux but has helped his swallowing.  He has pathology evaluation on April 16 which showed normal oral and oral pharyngeal function no aspiration.  There is persistent coughing during the exam, and apparently they did not see any evidence of esophageal obstruction.  His last endoscopic exam was with Dr. Terrial Rhodes in December 2001.  His exam was fairly unremarkable.  He did have some adenomatous colon polyps that were removed, and I do not think he has had followup colonoscopy.  He recently been placed on Dexilant 60 mg a day with some improvement in his reflux symptoms, but continues with coughing, questions, and frequent throat clearing.  Spot all these complaints he's not had anorexia, weight loss, hepatobiliary or lower gastrointestinal problems.  Denies abuse of alcohol, cigarettes or NSAIDs.  I have reviewed this patient's present history, medical and surgical past history, allergies and medications.     ROS:   All systems were reviewed and are negative unless otherwise stated in the HPI.    Physical Exam: Blood pressure 138/80, pulse 84 and regular and weight 224 pounds with a BMI of 30.43. General well developed well  nourished patient in no acute distress, appearing their stated age Eyes PERRLA, no icterus, fundoscopic exam per opthamologist Skin no lesions noted Neck supple, no adenopathy, no thyroid enlargement, no tenderness.Marland Kitchen obvious inform you of the right neck area with marked loss of tissue and some tenderness over thyroid cartilage.  I cannot appreciate any definite mass or any evidence of infection.  Examination oropharynx shows no specific abnormalities. Chest clear to percussion and auscultation Heart no significant murmurs, gallops or rubs noted Abdomen no hepatosplenomegaly masses or tenderness, BS normal.  Extremities no acute joint lesions, edema, phlebitis or evidence of cellulitis. Neurologic patient oriented x 3, cranial nerves intact, no focal neurologic deficits noted. Psychological mental status normal and normal affect.  Assessment and plan: This patient is chronic GERD with worsening recently secondary to loss of saliva related to his previous chemotherapy radiation.  Currently is on a Muscarinic agent to produce spit which may be worsening his acid reflux also.  Other considerations are radiation induced esophagitis, but I think that this is unlikely.  I've scheduled her for endoscopic exam and possible esophageal dilation.  He is to continue Dexilant 60 mg a day with a soft diet.  He has no symptoms of liquid dysphagia to suggest a neuromuscular problem.  Have to readdress his upper gastrointestinal problems, he also will probably need followup colonoscopy at some point.  I've seen by the lab today to check CBC and metabolic and anemia profile.  He understands the risk and  benefits of endoscopy and possible esophageal dilatation.  Please copy this note to Dr. Illene Regulus primary care and Dr. Narda Bonds in ENT  Encounter Diagnoses  Name Primary?  . GERD (gastroesophageal reflux disease) Yes  . Dysphagia

## 2012-05-13 NOTE — Patient Instructions (Addendum)
You have been scheduled for an endoscopy with propofol. Please follow written instructions given to you at your visit today. If you use inhalers (even only as needed), please bring them with you on the day of your procedure. Your physician has requested that you go to www.[email or web address] and enter the access code given to you at your visit today. This web site gives a general overview about your procedure. However, you should still follow specific instructions given to you by our office regarding your preparation for the procedure.  Your physician has requested that you go to the basement for lab work before leaving today.                                                We are excited to introduce MyChart, a new best-in-class service that provides you online access to important information in your electronic medical record. We want to make it easier for you to view your health information - all in one secure location - when and where you need it. We expect MyChart will enhance the quality of care and service we provide.  When you register for MyChart, you can:    View your test results.    Request appointments and receive appointment reminders via email.    Request medication renewals.    View your medical history, allergies, medications and immunizations.    Communicate with your physician's office through a password-protected site.    Conveniently print information such as your medication lists.  To find out if MyChart is right for you, please talk to a member of our clinical staff today. We will gladly answer your questions about this free health and wellness tool.  If you are age 8 or older and want a member of your family to have access to your record, you must provide written consent by completing a proxy form available at our office. Please speak to our clinical staff about guidelines regarding accounts for patients younger than age 12.  As you activate your MyChart account and need  any technical assistance, please call the MyChart technical support line at [phone]) 83-CHART [phone]) or email your question to [email or web address]. If you email your question(s), please include your name, a return phone number and the best time to reach you.  If you have non-urgent health-related questions, you can send a message to our office through MyChart at [email or web address]. If you have a medical emergency, call 911.  Thank you for using MyChart as your new health and wellness resource!   MyChart licensed from [employer],  [id number]. Patents Pending.

## 2012-05-14 ENCOUNTER — Encounter: Payer: Self-pay | Admitting: Gastroenterology

## 2012-05-14 NOTE — Telephone Encounter (Signed)
I called pt insurance [employer]. Was advised they have no PA on file for pt.  I spoke with Jenn to initiate PA.. It was approved from today-05/14/13. Pt and pharmacy is aware. Nothing further was needed

## 2012-05-17 ENCOUNTER — Encounter: Payer: Medicare Other | Admitting: Gastroenterology

## 2012-05-17 ENCOUNTER — Telehealth: Payer: Self-pay | Admitting: Pulmonary Disease

## 2012-05-17 NOTE — Telephone Encounter (Signed)
We are already aware. Will sign off.

## 2012-05-21 ENCOUNTER — Ambulatory Visit: Payer: Medicare Other | Admitting: Gastroenterology

## 2012-05-21 NOTE — Progress Notes (Signed)
The patient was called back into the admitting room with a water bottle in hand and drinking. I told him "that he would have to quit drinking the water at least two hours prior to the procedure or the doctors would not do it." He said that he "was not going to put the water down or quit drinking, that he doesn't have any salivary glands and has to drink." I, then, spoke with the CRNA Renae Fickle and told him the situation. He said that he "would have to quit drinking prior to the procedure." I came and told the patient what the CRNA said and once again tried to get him to stop drinking if he wanted the procedure done. I told him that the doctor "will not do the procedure if he is drinking because he is putting himself at risk for aspiration." He said, "you need to calm down." Then, Jennye Boroughs RN came to speak with him and told him the same information. Dr. Jarold Motto was called and said that he is not going to do the procedure if the patient can't stop drinking two hours prior to the procedure." The patient said that he "could not stop, that we wasted his time and is going home."

## 2012-05-26 ENCOUNTER — Encounter: Payer: Self-pay | Admitting: *Deleted

## 2012-05-26 NOTE — Progress Notes (Signed)
Noted... this patient was so hostile to staff I am going to have to discharge him from the practice

## 2012-05-27 ENCOUNTER — Institutional Professional Consult (permissible substitution): Payer: Medicare Other | Admitting: Pulmonary Disease

## 2012-05-27 ENCOUNTER — Telehealth: Payer: Self-pay | Admitting: Gastroenterology

## 2012-05-27 NOTE — Telephone Encounter (Signed)
Dismissal Letter sent by Certified Mail on 05/27/2012  Received the Return Receipt showing the Dismissal Letter was picked up 05/31/2012

## 2012-06-10 ENCOUNTER — Ambulatory Visit (INDEPENDENT_AMBULATORY_CARE_PROVIDER_SITE_OTHER): Payer: Medicare Other | Admitting: Pulmonary Disease

## 2012-06-10 ENCOUNTER — Encounter: Payer: Self-pay | Admitting: Pulmonary Disease

## 2012-06-10 VITALS — BP 128/80 | HR 88 | Temp 98.2°F | Ht 72.0 in | Wt 218.0 lb

## 2012-06-10 DIAGNOSIS — R0609 Other forms of dyspnea: Secondary | ICD-10-CM

## 2012-06-10 DIAGNOSIS — R0600 Dyspnea, unspecified: Secondary | ICD-10-CM

## 2012-06-10 DIAGNOSIS — K219 Gastro-esophageal reflux disease without esophagitis: Secondary | ICD-10-CM

## 2012-06-10 MED ORDER — BUDESONIDE-FORMOTEROL FUMARATE 160-4.5 MCG/ACT IN AERO: 2.0000 | INHALATION_SPRAY | Freq: Two times a day (BID) | RESPIRATORY_TRACT | Status: DC

## 2012-06-10 NOTE — Assessment & Plan Note (Addendum)
Trial of stopping cevimeline (this is a cholinergic & could be increasing gastric secretions) Stay on dexilant

## 2012-06-10 NOTE — Assessment & Plan Note (Signed)
Rx for symbicort will be sent x until GERD better controlled

## 2012-06-10 NOTE — Progress Notes (Signed)
  Subjective:    Patient ID: Samuel Messing., male    DOB: 1934/08/28, 78 y.o.   MRN: [id number]  HPI  PCP - Norins   78 y.o. never smoker referred for evaluation of dyspnea and exertion for the last 4 months.  He gets up and walking the dogs around the yard. 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 His survival function has been limited. His dyspnea bruits back to 2008 but has worsened in the last 6 months. Does not associate or orthopnea, paroxysmal nocturnal dyspnea, chest pain or pedal edema. Reports that his problem is with inspiration.  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 This did affect his voice. 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  A small hiatal hernia was noted on his imaging. He underwent septoplasty in February 2076th at Dr. Ezzard Standing with limited relief. CT in September 2011 had shown the bilateral maxillary sinusitis.  Pulmonary function testing showed FEV1 of 80%, FVC was 72%, smaller was were decreased at 68% and improved to 102% with bronchodilator. Diffusion capacity was normal. He was started on Foradil. Aerolizer and this has not helped him much  Chest x-ray in may 2012 did not show any infiltrates or effusions  CT neck, chest and abdomen in September 2011 did not show any evidence of metastatic disease.  He denies coughing spells after eating or drinking but is concerned about aspiration. His report frequent heartburn especially at night. He wonders if he may have narrowing of his trachea   >> dexilant approved x 78 yr  06/11/2074  Pt states his breathing has not been good. Pt ran out of symbicort after sample and did not get RX. Still having reflux, feeling  nauseated, and having hard time swallowing. Pt was suppose to have endo but cancelled it bc he drank water prior to procedure.Due to dryness of mouth (post RT) he really cannot stay without water for long.Due to an unfortunate sequence of events,  he was dismissed as a pt from Dr. Norval Gable office. He is scheduled to see Medoff next friday 78.   Review of Systems neg for any significant sore throat, dysphagia, itching, sneezing, nasal congestion or excess/ purulent secretions, fever, chills, sweats, unintended wt loss, pleuritic or exertional cp, hempoptysis, orthopnea pnd or change in chronic leg swelling. Also denies presyncope, palpitations, heartburn, abdominal pain, nausea, vomiting, diarrhea or change in bowel or urinary habits, dysuria,hematuria, rash, arthralgias, visual complaints, headache, numbness weakness or ataxia.     Objective:   Physical Exam  Gen. Pleasant, obese, in no distress, normal affect ENT - no lesions, no post nasal drip, class 2-3 airway Neck: No JVD, no thyromegaly, no carotid bruits Lungs: no use of accessory muscles, no dullness to percussion, decreased without rales or rhonchi  Cardiovascular: Rhythm regular, heart sounds  normal, no murmurs or gallops, no peripheral edema Abdomen: soft and non-tender, no hepatosplenomegaly, BS normal. Musculoskeletal: No deformities, no cyanosis or clubbing Neuro:  alert, non focal, no tremors       Assessment & Plan:

## 2012-06-10 NOTE — Patient Instructions (Addendum)
Trial of stopping cevimeline Stay on dexilant Rx for symbicort will be sent

## 2012-07-19 ENCOUNTER — Other Ambulatory Visit: Payer: Self-pay | Admitting: Gastroenterology

## 2012-07-19 DIAGNOSIS — R1310 Dysphagia, unspecified: Secondary | ICD-10-CM

## 2012-07-22 ENCOUNTER — Ambulatory Visit
Admission: RE | Admit: 2012-07-22 | Discharge: 2012-07-22 | Disposition: A | Discharge status: Home / Self Care | Payer: Medicare Other | Source: Ambulatory Visit | Attending: Gastroenterology | Admitting: Gastroenterology

## 2012-07-22 ENCOUNTER — Other Ambulatory Visit: Payer: Self-pay | Admitting: Gastroenterology

## 2012-07-22 DIAGNOSIS — R1310 Dysphagia, unspecified: Secondary | ICD-10-CM

## 2012-07-22 DIAGNOSIS — J90 Pleural effusion, not elsewhere classified: Secondary | ICD-10-CM

## 2012-07-22 MED ORDER — IOHEXOL 300 MG/ML  SOLN
75.0000 mL | Freq: Once | INTRAMUSCULAR | Status: AC | PRN
  Administered 2012-07-22: 75 mL via INTRAVENOUS

## 2012-07-26 ENCOUNTER — Telehealth: Payer: Self-pay | Admitting: Pulmonary Disease

## 2012-07-26 NOTE — Telephone Encounter (Signed)
Patient reports symbicort is causing his urine flow to decrease again. States that with his enlarged prostate this continues to happen so he can not take it Patient states he is coming in for appt Wed July 9 and Dr. Vassie Loll and he can discuss it then Will forward to Dr. Vassie Loll as Lorain Childes

## 2012-07-28 ENCOUNTER — Ambulatory Visit (INDEPENDENT_AMBULATORY_CARE_PROVIDER_SITE_OTHER): Payer: Medicare Other | Admitting: Pulmonary Disease

## 2012-07-28 ENCOUNTER — Encounter: Payer: Self-pay | Admitting: Pulmonary Disease

## 2012-07-28 VITALS — BP 122/64 | HR 66 | Temp 98.3°F | Ht 72.0 in | Wt 208.2 lb

## 2012-07-28 DIAGNOSIS — J90 Pleural effusion, not elsewhere classified: Secondary | ICD-10-CM

## 2012-07-28 MED ORDER — HYDROCOD POLST-CHLORPHEN POLST 10-8 MG/5ML PO LQCR: ORAL | Status: DC

## 2012-07-28 NOTE — Telephone Encounter (Signed)
Discussed with pt on OV OK to stop

## 2012-07-28 NOTE — Patient Instructions (Signed)
FLuid removal from around the right lung for testing We discussed risks Based on fluid results, we will decide regarding PET scan Copy of barium swallow report DELSYM 2 tsp thrice daily for cough Tussionex 2.5-32ml at bedtime

## 2012-07-28 NOTE — Progress Notes (Signed)
Subjective:    Patient ID: Samuel Messing., male    DOB: 1934/04/23, 78 y.o.   MRN: [id number]  HPI  PCP - Norins  78 year old never smoker for FU of dyspnea on exertion & cough for 4 months.  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 This did affect his voice. He gets up and walking the dogs around the yard. 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  His dyspnea dates back to 2008 but has worsened in the last 6 months.   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  A small hiatal hernia was noted on his imaging. He underwent septoplasty in February 2014 at Dr. Ezzard Standing with limited relief. CT in September 2011 had shown  bilateral maxillary sinusitis.  Pulmonary function testing showed FEV1 of 80%, FVC was 72%, smaller was were decreased at 68% and improved to 102% with bronchodilator. Diffusion capacity was normal. He was started on Foradil. Aerolizer and this has not helped him much  Chest x-ray in may 2012 did not show any infiltrates or effusions  CT neck, chest and abdomen in September 2011 did not show any evidence of metastatic disease.  He denies coughing spells after eating or drinking but is concerned about aspiration. His report frequent heartburn especially at night. He wonders if he may have narrowing of his trachea   >> dexilant approved x 1 yr  >> stop cimeviline, ct symbicort  07/28/2012 symbicort is causing his urine flow to decrease again.  States that with his enlarged prostate this continues to happen so he can not take it  Pt reports breathing has unchaged. Pt c/o cough w/ clear phlem-at times yellow. No wheezing, no chest.  Ba swallow - Small hiatal hernia. No definite reflux .  Indentation upon the posterior lower cervical esophagus from the left of midline. This may be due to scarring from prior radiation, but a mass or adenopathy cannot be excluded CXR - right effusion CT neck -Stable appearance of postsurgical and postradiation changes within the neck without evidence of disease recurrence. No new cervical adenopathy Ct chest compared to sep 2011- Layering moderate right pleural effusion with associated pleural thickening/nodularity.  Underlying compressive atelectasis of the right middle and lower lobes. No definite endobronchial lesion  7 mm left lower lobe pulmonary nodule, new 9 mm short axis precarinal node previously 7 mm   Past Medical History  Diagnosis Date  . Hyperlipidemia   . OSA (obstructive sleep apnea)   . BPH (benign prostatic hypertrophy)   . Kidney disease     TCC  . Xerostomia   . Pharynx cancer     squamous cell stage 4,s/p XRT,chemo, neck dissection  . Colon polyp     HYPERPLASTIC & TUBULAR ADENOMA(Colonoscopy-Dr.Catalina)  . Diverticulosis of colon (without mention of hemorrhage)     (Colonoscopy-Dr.[place])  . Internal hemorrhoids without mention of complication     (Colonoscopy-Dr.Linn)  . GERD (gastroesophageal reflux disease)     (EGD-Dr. Corinda Gubler)  . Atrophic gastritis without mention of hemorrhage     (EGD-Dr. Corinda Gubler)      Review of Systems neg for any significant sore throat, dysphagia, itching, sneezing, nasal congestion or excess/ purulent secretions, fever,  chills, sweats, unintended wt loss, pleuritic or exertional cp, hempoptysis, orthopnea pnd or change in chronic leg swelling. Also denies presyncope, palpitations, heartburn, abdominal pain, nausea, vomiting, diarrhea or change in bowel or urinary habits, dysuria,hematuria, rash, arthralgias, visual complaints, headache, numbness weakness or ataxia.     Objective:   Physical Exam  Gen. Pleasant, well-nourished, in no distress, normal affect ENT - no lesions,  no post nasal drip Neck: No JVD, no thyromegaly, no carotid bruits Lungs: no use of accessory muscles, no dullness to percussion, decreased BS on right 1/3 sub scapular , no rales or rhonchi  Cardiovascular: Rhythm regular, heart sounds  normal, no murmurs or gallops, no peripheral edema Abdomen: soft and non-tender, no hepatosplenomegaly, BS normal. Musculoskeletal: No deformities, no cyanosis or clubbing Neuro:  alert, non focal       Assessment & Plan:

## 2012-07-28 NOTE — Assessment & Plan Note (Signed)
Pleural effusion with underlying nodularity is concerning for malignancy- detailed discussion about implications with pt & wife Thoracentesis around the right lung  - send for cytology besides cell count & chemistry We discussed risks of bleeding & pneumothorax Based on fluid results, we will decide regarding PET scan Copy of barium swallow report DELSYM 2 tsp thrice daily for cough Tussionex 2.5-52ml at bedtime

## 2012-07-29 ENCOUNTER — Ambulatory Visit (HOSPITAL_COMMUNITY)
Admission: RE | Admit: 2012-07-29 | Discharge: 2012-07-29 | Disposition: A | Discharge status: Home / Self Care | Payer: Medicare Other | Source: Ambulatory Visit | Attending: Pulmonary Disease | Admitting: Pulmonary Disease

## 2012-07-29 ENCOUNTER — Other Ambulatory Visit: Payer: Self-pay | Admitting: Radiology

## 2012-07-29 ENCOUNTER — Ambulatory Visit (HOSPITAL_COMMUNITY)
Admission: RE | Admit: 2012-07-29 | Discharge: 2012-07-29 | Disposition: A | Discharge status: Home / Self Care | Payer: Medicare Other | Source: Ambulatory Visit | Attending: Radiology | Admitting: Radiology

## 2012-07-29 DIAGNOSIS — J90 Pleural effusion, not elsewhere classified: Secondary | ICD-10-CM | POA: Insufficient documentation

## 2012-07-29 DIAGNOSIS — C109 Malignant neoplasm of oropharynx, unspecified: Secondary | ICD-10-CM | POA: Insufficient documentation

## 2012-07-29 DIAGNOSIS — D72820 Lymphocytosis (symptomatic): Secondary | ICD-10-CM | POA: Insufficient documentation

## 2012-07-29 LAB — BODY FLUID CELL COUNT WITH DIFFERENTIAL: Total Nucleated Cell Count, Fluid: 4367 cu mm — ABNORMAL HIGH (ref 0–1000)

## 2012-07-29 LAB — AMYLASE, BODY FLUID: Amylase, Fluid: 30 U/L

## 2012-07-29 LAB — PROTEIN, BODY FLUID: Total protein, fluid: 4.3 g/dL

## 2012-07-29 LAB — LACTATE DEHYDROGENASE, PLEURAL OR PERITONEAL FLUID

## 2012-07-29 LAB — GLUCOSE, SEROUS FLUID: Glucose, Fluid: 83 mg/dL

## 2012-07-29 NOTE — Procedures (Signed)
Successful US guided right thoracentesis. Yielded of hazy yellow fluid. The pt experienced some right sided pain and pressure and requested the procedure be stopped. No immediate complications.  Specimen was sent for labs. CXR ordered.  Brayton El PA-C 07/29/2012 10:36 AM

## 2012-08-01 LAB — BODY FLUID CULTURE: Culture: NO GROWTH

## 2012-08-02 ENCOUNTER — Ambulatory Visit (INDEPENDENT_AMBULATORY_CARE_PROVIDER_SITE_OTHER): Payer: Medicare Other | Admitting: Pulmonary Disease

## 2012-08-02 ENCOUNTER — Encounter: Payer: Self-pay | Admitting: Pulmonary Disease

## 2012-08-02 VITALS — BP 122/76 | HR 76 | Temp 98.8°F | Ht 72.0 in | Wt 207.0 lb

## 2012-08-02 DIAGNOSIS — J90 Pleural effusion, not elsewhere classified: Secondary | ICD-10-CM

## 2012-08-02 NOTE — Patient Instructions (Addendum)
PET scan You may need a pleural biopsy  Use DELSYM 2 tsp thrice daily for cough - If cough persists, call me

## 2012-08-02 NOTE — Assessment & Plan Note (Addendum)
PET scan & TCTS referral based on results He may need a VATS pleural biopsy , not sur eif a pleurX cathter will be of benefit Pain during thora suggests trapped lung , unfortunately pleural manometry not available. Malignancy remains a concern even though cytology neg. Doubt TB, no remote h/o severe pneumonia Use DELSYM 2 tsp thrice daily for cough - If cough persists, call me Long discussion with pt & wife

## 2012-08-02 NOTE — Progress Notes (Signed)
  Subjective:    Patient ID: Samuel Hurley., male    DOB: 01-30-1934, 78 y.o.   MRN: [id number]  HPI PCP - Norins  78 year old never smoker for FU of dyspnea on exertion & cough for 4 months, Right pleural effusion on imaging.  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 This did affect his voice.  He gets up and walking the dogs around the yard. 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 His dyspnea dates back to 2008 but has worsened in the last 6 months.  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  A small hiatal hernia was noted on his imaging. He underwent septoplasty in February 2014 at Dr. Ezzard Standing with limited relief. CT in September 2011 had shown bilateral maxillary sinusitis.  Pulmonary function testing showed FEV1 of 80%, FVC was 72%, smaller was were decreased at 68% and improved to 102% with bronchodilator. Diffusion capacity was normal. He was started on Foradil. Aerolizer and this has not helped him much  Chest x-ray in may 2012 did not show any infiltrates or effusions  CT neck, chest and abdomen in September 2011 did not show any evidence of metastatic disease.  He denies coughing spells after eating or drinking but is concerned about aspiration. His report frequent heartburn especially at night. He wonders if he may have narrowing of his trachea  Ba swallow - Small hiatal hernia. No definite reflux . Indentation upon the posterior lower cervical esophagus from the left of midline. This may be due to scarring from prior radiation, but a mass or adenopathy cannot be excluded  CXR - right effusion  CT neck -Stable appearance of postsurgical and postradiation changes within the neck without evidence of disease  recurrence. No new cervical adenopathy  Ct chest compared to sep 2011- Layering moderate right pleural effusion with associated pleural thickening/nodularity.  Underlying compressive atelectasis of the right middle and lower lobes. No definite endobronchial lesion  7 mm left lower lobe pulmonary nodule, new  9 mm short axis precarinal node previously 7 mm  08/02/2012 Underwent RT -thoracentesis with removal of 600 cc fluid - he had considerable pain during procedure Lymphocytic exudate, neg cytology, neg cx He did not fill tussionex Rx , took robitussin instead, still sleeping in a chair & c/o dyspnea  Review of Systems neg for any significant sore throat, dysphagia, itching, sneezing, nasal congestion or excess/ purulent secretions, fever, chills, sweats, unintended wt loss, pleuritic or exertional cp, hempoptysis, orthopnea pnd or change in chronic leg swelling. Also denies presyncope, palpitations, heartburn, abdominal pain, nausea, vomiting, diarrhea or change in bowel or urinary habits, dysuria,hematuria, rash, arthralgias, visual complaints, headache, numbness weakness or ataxia.     Objective:   Physical Exam  Gen. Pleasant, well-nourished, in no distress ENT - no lesions, no post nasal drip Neck: No JVD, no thyromegaly, no carotid bruits Lungs: no use of accessory muscles, no dullness to percussion,decresaed on rt infrascapular without rales or rhonchi  Cardiovascular: Rhythm regular, heart sounds  normal, no murmurs or gallops, no peripheral edema Musculoskeletal: No deformities, no cyanosis or clubbing        Assessment & Plan:

## 2012-08-06 ENCOUNTER — Encounter (HOSPITAL_COMMUNITY)
Admission: RE | Admit: 2012-08-06 | Discharge: 2012-08-06 | Disposition: A | Discharge status: Home / Self Care | Payer: Medicare Other | Source: Ambulatory Visit | Attending: Pulmonary Disease | Admitting: Pulmonary Disease

## 2012-08-06 ENCOUNTER — Telehealth: Payer: Self-pay | Admitting: *Deleted

## 2012-08-06 ENCOUNTER — Encounter (HOSPITAL_COMMUNITY): Payer: Self-pay

## 2012-08-06 DIAGNOSIS — J90 Pleural effusion, not elsewhere classified: Secondary | ICD-10-CM

## 2012-08-06 MED ORDER — FLUDEOXYGLUCOSE F - 18 (FDG) INJECTION
16.1000 | Freq: Once | INTRAVENOUS | Status: AC | PRN
  Administered 2012-08-06: 16.1 via INTRAVENOUS

## 2012-08-06 MED ORDER — ACETAMINOPHEN-CODEINE #3 300-30 MG PO TABS: 1.0000 | ORAL_TABLET | Freq: Four times a day (QID) | ORAL | Status: DC | PRN

## 2012-08-06 NOTE — Telephone Encounter (Signed)
Pt c/o R lung pain x this AM. Per pt he took 2 tylenol for the pain w/o relief. He is requesting RX  Per RA we can call in tylenol #3 1 q6hrs prn #30 x 0 refills. Pt aware RX will be called in. Nothing further was needed

## 2012-08-10 ENCOUNTER — Ambulatory Visit (HOSPITAL_COMMUNITY)
Admission: RE | Admit: 2012-08-10 | Discharge: 2012-08-10 | Disposition: A | Discharge status: Home / Self Care | Payer: Medicare Other | Source: Ambulatory Visit | Attending: Pulmonary Disease | Admitting: Pulmonary Disease

## 2012-08-10 ENCOUNTER — Ambulatory Visit (HOSPITAL_COMMUNITY): Payer: Medicare Other

## 2012-08-10 ENCOUNTER — Encounter (HOSPITAL_COMMUNITY)
Admission: RE | Disposition: A | Discharge status: Home / Self Care | Payer: Self-pay | Source: Ambulatory Visit | Attending: Pulmonary Disease

## 2012-08-10 DIAGNOSIS — R222 Localized swelling, mass and lump, trunk: Secondary | ICD-10-CM

## 2012-08-10 DIAGNOSIS — Z923 Personal history of irradiation: Secondary | ICD-10-CM | POA: Insufficient documentation

## 2012-08-10 DIAGNOSIS — R918 Other nonspecific abnormal finding of lung field: Secondary | ICD-10-CM

## 2012-08-10 DIAGNOSIS — J9819 Other pulmonary collapse: Secondary | ICD-10-CM | POA: Insufficient documentation

## 2012-08-10 DIAGNOSIS — Z9221 Personal history of antineoplastic chemotherapy: Secondary | ICD-10-CM | POA: Insufficient documentation

## 2012-08-10 DIAGNOSIS — J90 Pleural effusion, not elsewhere classified: Secondary | ICD-10-CM | POA: Insufficient documentation

## 2012-08-10 DIAGNOSIS — K449 Diaphragmatic hernia without obstruction or gangrene: Secondary | ICD-10-CM | POA: Insufficient documentation

## 2012-08-10 DIAGNOSIS — C342 Malignant neoplasm of middle lobe, bronchus or lung: Secondary | ICD-10-CM | POA: Insufficient documentation

## 2012-08-10 DIAGNOSIS — Z85819 Personal history of malignant neoplasm of unspecified site of lip, oral cavity, and pharynx: Secondary | ICD-10-CM | POA: Insufficient documentation

## 2012-08-10 DIAGNOSIS — R109 Unspecified abdominal pain: Secondary | ICD-10-CM | POA: Insufficient documentation

## 2012-08-10 DIAGNOSIS — K219 Gastro-esophageal reflux disease without esophagitis: Secondary | ICD-10-CM | POA: Insufficient documentation

## 2012-08-10 DIAGNOSIS — C3490 Malignant neoplasm of unspecified part of unspecified bronchus or lung: Secondary | ICD-10-CM | POA: Diagnosis present

## 2012-08-10 HISTORY — PX: VIDEO BRONCHOSCOPY: SHX5072

## 2012-08-10 SURGERY — BRONCHOSCOPY, WITH FLUOROSCOPY
Anesthesia: Moderate Sedation | Laterality: Bilateral

## 2012-08-10 MED ORDER — LIDOCAINE HCL 2 % EX GEL
CUTANEOUS | Status: DC | PRN
  Administered 2012-08-10: 1

## 2012-08-10 MED ORDER — FENTANYL CITRATE 0.05 MG/ML IJ SOLN
INTRAMUSCULAR | Status: AC
  Filled 2012-08-10: qty 4

## 2012-08-10 MED ORDER — LIDOCAINE HCL (PF) 1 % IJ SOLN
INTRAMUSCULAR | Status: DC | PRN
  Administered 2012-08-10: 6 mL

## 2012-08-10 MED ORDER — MIDAZOLAM HCL 5 MG/ML IJ SOLN
INTRAMUSCULAR | Status: AC
  Filled 2012-08-10: qty 2

## 2012-08-10 MED ORDER — PHENYLEPHRINE HCL 0.25 % NA SOLN
NASAL | Status: DC | PRN
  Administered 2012-08-10: 2 via NASAL

## 2012-08-10 MED ORDER — MIDAZOLAM HCL 10 MG/2ML IJ SOLN
INTRAMUSCULAR | Status: DC | PRN
  Administered 2012-08-10 (×4): 1 mg via INTRAVENOUS

## 2012-08-10 MED ORDER — FENTANYL CITRATE 0.05 MG/ML IJ SOLN
INTRAMUSCULAR | Status: DC | PRN
  Administered 2012-08-10 (×4): 25 ug via INTRAVENOUS

## 2012-08-10 NOTE — Progress Notes (Signed)
Video bronchoscopy procedure done. Brushings intervention performed. Washing intervention performed. Biopsy intervention performed.

## 2012-08-10 NOTE — H&P (View-Only) (Signed)
  Subjective:    Patient ID: Samuel C [name] Jr., male    DOB: 11/26/1934, 78 y.o.   MRN: [id number]  HPI PCP - Norins  78-year-old never smoker for FU of dyspnea on exertion & cough for 4 months, Right pleural effusion on imaging.  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 This did affect his voice.  He gets up and walking the dogs around the yard. 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 His dyspnea dates back to 2008 but has worsened in the last 6 months.  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  A small hiatal hernia was noted on his imaging. He underwent septoplasty in February 2014 at Dr. Newman with limited relief. CT in September 2011 had shown bilateral maxillary sinusitis.  Pulmonary function testing showed FEV1 of 80%, FVC was 72%, smaller was were decreased at 68% and improved to 102% with bronchodilator. Diffusion capacity was normal. He was started on Foradil. Aerolizer and this has not helped him much  Chest x-ray in may 2012 did not show any infiltrates or effusions  CT neck, chest and abdomen in September 2011 did not show any evidence of metastatic disease.  He denies coughing spells after eating or drinking but is concerned about aspiration. His report frequent heartburn especially at night. He wonders if he may have narrowing of his trachea  Ba swallow - Small hiatal hernia. No definite reflux . Indentation upon the posterior lower cervical esophagus from the left of midline. This may be due to scarring from prior radiation, but a mass or adenopathy cannot be excluded  CXR - right effusion  CT neck -Stable appearance of postsurgical and postradiation changes within the neck without evidence of disease  recurrence. No new cervical adenopathy  Ct chest compared to sep 2011- Layering moderate right pleural effusion with associated pleural thickening/nodularity.  Underlying compressive atelectasis of the right middle and lower lobes. No definite endobronchial lesion  7 mm left lower lobe pulmonary nodule, new  9 mm short axis precarinal node previously 7 mm  08/02/2012 Underwent RT -thoracentesis with removal of 600 cc fluid - he had considerable pain during procedure Lymphocytic exudate, neg cytology, neg cx He did not fill tussionex Rx , took robitussin instead, still sleeping in a chair & c/o dyspnea  Review of Systems neg for any significant sore throat, dysphagia, itching, sneezing, nasal congestion or excess/ purulent secretions, fever, chills, sweats, unintended wt loss, pleuritic or exertional cp, hempoptysis, orthopnea pnd or change in chronic leg swelling. Also denies presyncope, palpitations, heartburn, abdominal pain, nausea, vomiting, diarrhea or change in bowel or urinary habits, dysuria,hematuria, rash, arthralgias, visual complaints, headache, numbness weakness or ataxia.     Objective:   Physical Exam  Gen. Pleasant, well-nourished, in no distress ENT - no lesions, no post nasal drip Neck: No JVD, no thyromegaly, no carotid bruits Lungs: no use of accessory muscles, no dullness to percussion,decresaed on rt infrascapular without rales or rhonchi  Cardiovascular: Rhythm regular, heart sounds  normal, no murmurs or gallops, no peripheral edema Musculoskeletal: No deformities, no cyanosis or clubbing        Assessment & Plan:   

## 2012-08-10 NOTE — Op Note (Signed)
Indication: RML/ LL atelectasis & pleural effusion in the 78 -year-old never smoker  Written informed consent was obtained from the patient prior to the procedure. The risks of the procedure including coughing, bleeding and a small chance of lung cancer requiring a chest tube were discussed with the patient in great detail and evidenced understanding.  4 mg of Versed and  of fentanyl were used in divided doses during the procedure. Bronchoscope was inserted from the right Nare. The upper airway appeared normal. Vocal cord showed normal appearance in motion. The trachea bronchial tree was then examined to the subsegmental level. No secretions were noted. Endobronchial lesions were noted in the RML & superior segment of RLL.Marland Kitchen  Attention was then turned to the right middle &  lower lobe. Brushings & Bronchoalveolar lavage was obtained from the right middle & sup segment of right lower lobes with good return. Transbronchial biopsies x3 were obtained from the different subsegments of the right middle lobe. The patient tolerated procedure well with minimal bleeding.  A portable chest XR. will be performed to rule out presence of pneumothorax. He was awake and alert in the end of the procedure.  ALVA,RAKESH V.

## 2012-08-10 NOTE — Interval H&P Note (Signed)
Samuel Hurley presented today for surgery, with the diagnosis of mediastinal lymphadnopathy  The various methods of treatment have been discussed with the patient and family. After consideration of risks, benefits and other options for treatment, the patient has consented to  Procedure(s) (LRB): bronchoscopy with biopsy  as a surgical intervention .  The patient's history has been reviewed, patient examined, no change in status, stable for surgery.  I have reviewed the patient's chart and labs.  Questions were answered to the patient's satisfaction.

## 2012-08-11 ENCOUNTER — Encounter (HOSPITAL_COMMUNITY): Payer: Self-pay | Admitting: Pulmonary Disease

## 2012-08-11 ENCOUNTER — Telehealth: Payer: Self-pay | Admitting: Pulmonary Disease

## 2012-08-11 DIAGNOSIS — C3491 Malignant neoplasm of unspecified part of right bronchus or lung: Secondary | ICD-10-CM

## 2012-08-11 NOTE — Telephone Encounter (Signed)
Discussed biopsy results with patient  -referral to Oncology [facility] Fellers discussion with patient regarding treatment outcomes  Pl mail him copy of report

## 2012-08-12 ENCOUNTER — Telehealth: Payer: Self-pay | Admitting: Internal Medicine

## 2012-08-12 ENCOUNTER — Telehealth: Payer: Self-pay | Admitting: *Deleted

## 2012-08-12 ENCOUNTER — Other Ambulatory Visit: Payer: Self-pay | Admitting: Medical Oncology

## 2012-08-12 DIAGNOSIS — C3490 Malignant neoplasm of unspecified part of unspecified bronchus or lung: Secondary | ICD-10-CM

## 2012-08-12 LAB — CULTURE, BAL-QUANTITATIVE W GRAM STAIN

## 2012-08-12 NOTE — Telephone Encounter (Signed)
Results placed in mail to pt

## 2012-08-12 NOTE — Telephone Encounter (Signed)
Spoke with pt regarding appt 08/13/12 at 8:45 labs and 9:00 Dr. Arbutus Ped.  He verbalized understanding of time and place of appt

## 2012-08-12 NOTE — Telephone Encounter (Signed)
Spoke with pt regarding appt time change.  He verbalized understanding of appt time change.

## 2012-08-12 NOTE — Telephone Encounter (Signed)
C/D 08/12/12 for appt. 08/13/12

## 2012-08-12 NOTE — Telephone Encounter (Signed)
PT SCHEDULE 07/25 @ 8:30 W/DR. MOHAMED. PER DANA

## 2012-08-13 ENCOUNTER — Telehealth: Payer: Self-pay | Admitting: Internal Medicine

## 2012-08-13 ENCOUNTER — Ambulatory Visit: Payer: Medicare Other | Admitting: Internal Medicine

## 2012-08-13 ENCOUNTER — Encounter: Payer: Self-pay | Admitting: Internal Medicine

## 2012-08-13 ENCOUNTER — Ambulatory Visit (HOSPITAL_BASED_OUTPATIENT_CLINIC_OR_DEPARTMENT_OTHER): Payer: Medicare Other | Admitting: Internal Medicine

## 2012-08-13 ENCOUNTER — Ambulatory Visit: Payer: Medicare Other

## 2012-08-13 ENCOUNTER — Other Ambulatory Visit: Payer: Medicare Other | Admitting: Lab

## 2012-08-13 ENCOUNTER — Other Ambulatory Visit (HOSPITAL_BASED_OUTPATIENT_CLINIC_OR_DEPARTMENT_OTHER): Payer: Medicare Other | Admitting: Lab

## 2012-08-13 ENCOUNTER — Other Ambulatory Visit: Payer: Self-pay | Admitting: Radiology

## 2012-08-13 VITALS — BP 117/62 | HR 65 | Temp 97.3°F | Resp 18 | Ht 72.0 in | Wt 202.7 lb

## 2012-08-13 DIAGNOSIS — C3490 Malignant neoplasm of unspecified part of unspecified bronchus or lung: Secondary | ICD-10-CM

## 2012-08-13 DIAGNOSIS — C3491 Malignant neoplasm of unspecified part of right bronchus or lung: Secondary | ICD-10-CM

## 2012-08-13 LAB — CBC WITH DIFFERENTIAL/PLATELET
Eosinophils Absolute: 0.1 10*3/uL (ref 0.0–0.5)
LYMPH%: 8.8 % — ABNORMAL LOW (ref 14.0–49.0)
MONO#: 0.8 10*3/uL (ref 0.1–0.9)
NEUT#: 4.7 10*3/uL (ref 1.5–6.5)
Platelets: 344 10*3/uL (ref 140–400)
RBC: 3.48 10*6/uL — ABNORMAL LOW (ref 4.20–5.82)
RDW: 15.5 % — ABNORMAL HIGH (ref 11.0–14.6)
WBC: 6.2 10*3/uL (ref 4.0–10.3)
lymph#: 0.5 10*3/uL — ABNORMAL LOW (ref 0.9–3.3)

## 2012-08-13 LAB — COMPREHENSIVE METABOLIC PANEL (CC13)
ALT: 9 U/L (ref 0–55)
Albumin: 2.4 g/dL — ABNORMAL LOW (ref 3.5–5.0)
CO2: 27 mEq/L (ref 22–29)
Calcium: 9.3 mg/dL (ref 8.4–10.4)
Chloride: 104 mEq/L (ref 98–109)
Glucose: 104 mg/dl (ref 70–140)
Potassium: 4.4 mEq/L (ref 3.5–5.1)
Sodium: 140 mEq/L (ref 136–145)
Total Protein: 7 g/dL (ref 6.4–8.3)

## 2012-08-13 MED ORDER — LORAZEPAM 1 MG PO TABS: ORAL_TABLET | ORAL | Status: DC

## 2012-08-13 MED ORDER — PROCHLORPERAZINE MALEATE 10 MG PO TABS: 10.0000 mg | ORAL_TABLET | Freq: Four times a day (QID) | ORAL | Status: DC | PRN

## 2012-08-13 MED ORDER — LIDOCAINE-PRILOCAINE 2.5-2.5 % EX CREA: TOPICAL_CREAM | CUTANEOUS | Status: DC | PRN

## 2012-08-13 NOTE — Progress Notes (Signed)
Checked in new pt with no financial concerns. °

## 2012-08-13 NOTE — Telephone Encounter (Signed)
gv and printed appt sched and avs for pt....MW added tx   °

## 2012-08-14 NOTE — Progress Notes (Signed)
[facility] [phone]   [phone]  CONSULT NOTE  REFERRING PHYSICIAN: Dr. Cyril Mourning  REASON FOR CONSULTATION:  78 years old white male recently diagnosed with lung cancer.  HPI Samuel Hurley. is a never smoker 78 y.o. male but with a history of second hand smoking exposure. The patient has past medical history significant for oropharyngeal carcinoma diagnosed in 2008 status post surgical resection with radical right neck lymph node dissection. This was followed by concurrent chemoradiation at [facility]. The patient mentions that 15 months ago he was not feeling good and started having more shortness of breath. He was seen by 7 physician in the past and was asking for repeat PET scan which was not performed. Recently his shortness breath and was getting worse and the patient had chest x-ray on 07/22/2012 and it showed moderate sized right pleural effusion with volume loss on the right. This was followed by CT scan of the neck and chest on 07/22/2012. CT scan of the neck showed a stable appearance of postsurgical and postradiation changes within the neck without evidence of disease recurrence. No new cervical adenopathy identified. CT scan of the chest on the same day showed layering moderate right pleural effusion with associated pleural thickening/nodularity. There is underlying compressive atelectasis of the right middle and lower lobes. No definite endobronchial lesion is seen. There was also a 7 mm left lower lobe pulmonary nodule that was new. On 07/29/2012 the patient underwent ultrasound guided right thoracentesis by interventional radiology with drainage of 620 mL of pleural fluid. The final cytology was not conclusive for any malignancy. The patient was seen by Dr. Vassie Loll and on 08/06/2012 he had a PET scan performed and it showed Hypermetabolic right suprahilar mass is felt to represent a combination of atelectatic lung and  bronchogenic carcinoma. Rind of hypermetabolic nodularity within the right pleural space is most consistent transpleural spread of tumor. Several discrete hypermetabolic nodules within the anterior mediastinum along the pleural surface consistent with metastatic implants. One of these at the costosternal junction maybe accessible for biopsy. Questionable mild metabolic activity of left lower lobe pulmonary nodule is concerning but indeterminate. No evidence of metastasis outside the thorax.  On 08/10/2012 the patient underwent bronchoscopy with biopsies under the care of Dr.Alva. Endobronchial lesions were noted in the right middle lobe and superior segment of the right lower lobe. The final pathology (Accession: [id number]) from the transbronchial right middle lobe biopsy was positive for small cell carcinoma. Dr. Vassie Loll kindly referred the patient to me today for further evaluation and recommendation regarding treatment of his condition. When seen today the patient continues to complain of shortness of breath with exertion as well as mild chest pain and cough productive of whitish sputum. He has no hemoptysis. He lost around 30 pounds in the last 6 months. He also complains of blurry vision and occasional headache. He also has mild nausea. His family history significant for congestive heart failure) died in their 39s.  The patient is married and has 2 children. He was accompanied by his wife Samuel Hurley. 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 He has no history of smoking but has exposure to secondhand smoking. He has no history of alcohol or drug abuse.   Past Medical History  Diagnosis Date  . Hyperlipidemia   . OSA (obstructive sleep apnea)   . BPH (benign prostatic hypertrophy)   . Kidney disease     TCC  . Xerostomia   .  Pharynx cancer     squamous cell stage 4,s/p XRT,chemo, neck dissection  . Colon polyp     HYPERPLASTIC & TUBULAR ADENOMA(Colonoscopy-Dr.Waggaman)  . Diverticulosis of colon  (without mention of hemorrhage)     (Colonoscopy-Dr.Elma)  . Internal hemorrhoids without mention of complication     (Colonoscopy-Dr.Klondike)  . GERD (gastroesophageal reflux disease)     (EGD-Dr. Corinda Gubler)  . Atrophic gastritis without mention of hemorrhage     (EGD-Dr. Corinda Gubler)    Past Surgical History  Procedure Laterality Date  . Appendectomy    . Lumbar laminectomy    . Tonsillectomy    . Bilateral ganglionectomies    . Radical right neck dissection  2008  . Pci-rfa renal cell (tcc) cancer  2009    pt denies  . Cholecystectomy  dec. 2009  . Septoplasty  03/01/12    with double turbinectomy  . Video bronchoscopy Bilateral 08/10/2012    Procedure: VIDEO BRONCHOSCOPY WITH FLUORO;  Surgeon: Oretha Milch, MD;  Location: [facility] ENDOSCOPY;  Service: Cardiopulmonary;  Laterality: Bilateral;    Family History  Problem Relation Age of Onset  . Heart failure Father   . Kidney failure Father   . Other Father     Renal failure/ CHF  . Heart failure Mother   . Other Mother     CHF  . Colon cancer Neg Hx   . Prostate cancer Neg Hx   . Diabetes Neg Hx     Social History History  Substance Use Topics  . Smoking status: Never Smoker   . Smokeless tobacco: Never Used  . Alcohol Use: No    Allergies  Allergen Reactions  . Erythromycin     REACTION: Diarrhea  . Tetracycline     Current Outpatient Prescriptions  Medication Sig Dispense Refill  . BENZONATATE PO Take by mouth. 3 times daily      . chlorhexidine (PERIDEX) 0.12 % solution Use as directed 15 mLs in the mouth or throat 2 (two) times daily. 1/2 oz rinse and spit. As needed      . cyanocobalamin 2000 MCG tablet Take 2,000 mcg by mouth daily.        Marland Kitchen dexlansoprazole (DEXILANT) 60 MG capsule Take 1 capsule (60 mg total) by mouth daily.  5 capsule  0  . finasteride (PROSCAR) 5 MG tablet Take 5 mg by mouth daily.        Marland Kitchen guaifenesin (ROBITUSSIN) 100 MG/5ML syrup 3 times daily      . levothyroxine (SYNTHROID,  LEVOTHROID) 75 MCG tablet Take 75 mcg by mouth daily.        Marland Kitchen terazosin (HYTRIN) 1 MG capsule Take 1 mg by mouth 2 (two) times daily.       Marland Kitchen acetaminophen-codeine (TYLENOL #3) 300-30 MG per tablet Take 1 tablet by mouth every 6 (six) hours as needed for pain.  30 tablet  0  . lidocaine-prilocaine (EMLA) cream Apply topically as needed.  30 g  0  . LORazepam (ATIVAN) 1 MG tablet 1 tablet by mouth once for MRI  2 tablet  0  . prochlorperazine (COMPAZINE) 10 MG tablet Take 1 tablet (10 mg total) by mouth every 6 (six) hours as needed.  60 tablet  0   Current Facility-Administered Medications  Medication Dose Route Frequency Provider Last Rate Last Dose  . TDaP (BOOSTRIX) injection 0.5 mL  0.5 mL Intramuscular Once Jacques Navy, MD        Review of Systems  A comprehensive review of  systems was negative except for: Constitutional: positive for fatigue and weight loss Respiratory: positive for cough, dyspnea on exertion, pleurisy/chest pain and sputum Gastrointestinal: positive for nausea Neurological: positive for headaches  Physical Exam  ZOX:WRUEA, healthy, no distress, well nourished and well developed SKIN: skin color, texture, turgor are normal HEAD: Normocephalic, No masses, lesions, tenderness or abnormalities EYES: normal, PERRLA EARS: External ears normal OROPHARYNX:no exudate and no erythema  NECK: supple, no adenopathy LYMPH:  no palpable lymphadenopathy, no hepatosplenomegaly LUNGS: Decreased breath sounds and dullness to percussion at the right lower lung field. HEART: regular rate & rhythm, no murmurs and no gallops ABDOMEN:abdomen soft, non-tender, obese, normal bowel sounds and no masses or organomegaly BACK: Back symmetric, no curvature. EXTREMITIES:no joint deformities, effusion, or inflammation, no edema, no skin discoloration  NEURO: alert & oriented x 3 with fluent speech, no focal motor/sensory deficits, gait normal  PERFORMANCE STATUS: ECOG 1  LABORATORY  DATA: Lab Results  Component Value Date   WBC 6.2 08/13/2012   HGB 10.4* 08/13/2012   HCT 31.5* 08/13/2012   MCV 90.5 08/13/2012   PLT 344 08/13/2012      Chemistry      Component Value Date/Time   NA 140 08/13/2012 0938   NA 138 05/13/2012 1005   K 4.4 08/13/2012 0938   K 3.7 05/13/2012 1005   CL 101 05/13/2012 1005   CO2 27 08/13/2012 0938   CO2 30 05/13/2012 1005   BUN 11.8 08/13/2012 0938   BUN 12 05/13/2012 1005   CREATININE 0.9 08/13/2012 0938   CREATININE 1.0 05/13/2012 1005      Component Value Date/Time   CALCIUM 9.3 08/13/2012 0938   CALCIUM 8.7 05/13/2012 1005   ALKPHOS 93 08/13/2012 0938   ALKPHOS 76 05/13/2012 1005   AST 12 08/13/2012 0938   AST 16 05/13/2012 1005   ALT 9 08/13/2012 0938   ALT 11 05/13/2012 1005   BILITOT 0.28 08/13/2012 0938   BILITOT 0.5 05/13/2012 1005       RADIOGRAPHIC STUDIES: Dg Chest 1 View  07/29/2012   *RADIOLOGY REPORT*  Clinical Data: Right effusion status post thoracentesis  CHEST - 1 VIEW  Comparison: 07/22/2012  Findings: The patient is status post 600 ml right thoracentesis. Moderate to large right effusion persist.  Procedure was stopped because of chest pain and coughing.  No significant pneumothorax. Left lung remains clear.  Stable heart size and vascularity. Residual right middle and lower lobe atelectasis / consolidation.  IMPRESSION: No pneumothorax following 600 ml right thoracentesis.  Stable chest exam.   Original Report Authenticated By: Judie Petit. Miles Costain, M.D.   Dg Chest 2 View  07/22/2012   *RADIOLOGY REPORT*  Clinical Data: Cough, shortness of breath, history of head neck carcinoma with radiation treatment  CHEST - 2 VIEW  Comparison: Chest x-ray of 04/11/2010  Findings: There is now a moderate sized right pleural effusion present with volume loss on the right.  The left lung is clear. Mediastinal contours are stable.  The heart is within normal limits in size.  No bony abnormality is seen.  A surgical clip overlies the lower neck on the left.   IMPRESSION: Moderate sized right pleural effusion with volume loss on the right.  Consider CT of the chest with IV contrast to exclude a central endobronchial lesion.   Original Report Authenticated By: Dwyane Dee, M.D.   Ct Soft Tissue Neck W Contrast  07/22/2012   *RADIOLOGY REPORT*  Clinical Data: tongue cancer  CT NECK WITH CONTRAST  Technique:  Multidetector CT imaging of the neck was performed with intravenous contrast.  Contrast: 75mL OMNIPAQUE IOHEXOL 300 MG/ML  SOLN  Comparison: Prior CT from 09/25/2009 as well as earlier studies.  Findings: Visualized portions of the brain are unremarkable.  The globes are normal.  Small bilateral layering fluid is present within the left maxillary sinus.  The left parotid gland is normal.  Post-surgical changes from prior radical right neck dissection as well as post radiation changes are again seen.  No evidence of recurrent mass or cervical adenopathy.  The oral cavity and oropharynx are normal.  Circumferential soft tissue density is seen encasing the right common and internal carotid arteries without significant enhancement, likely postradiation/surgical changes. This finding is similar as compared to the prior examination.  No mass-like soft tissue density is seen within the neck.  A moderate right pleural effusion is partially visualized.  No acute osseous abnormalities are identified.  Chronic degenerative changes are again noted within the cervical spine with mild spinal stenosis at C4-5.  IMPRESSION:  1.  Stable appearance of postsurgical and postradiation changes within the neck without evidence of disease recurrence.  No new cervical adenopathy identified. 2.  Partial visualization of right pleural effusion.   Original Report Authenticated By: Rise Mu, M.D.   Ct Chest W Contrast  07/22/2012   *RADIOLOGY REPORT*  Clinical Data: Pleural effusion on chest radiograph, history of tongue and renal cell cancers in 2008  CT CHEST WITH CONTRAST   Technique:  Multidetector CT imaging of the chest was performed following the standard protocol during bolus administration of intravenous contrast.  Contrast: 75mL OMNIPAQUE IOHEXOL 300 MG/ML  SOLN  Comparison: CT chest dated 09/25/2009  Findings: Layering moderate right pleural effusion. Associated pleural thickening posteriorly (series 2/image 38), possibly reflecting an exudative process.  Underlying compressive atelectasis of the right middle and lower lobes. Debris within the right lower lobe bronchus (series 3/image 31).  No definite endobronchial lesion is seen.  7 x 6 mm left lower lobe pulmonary nodule (series 3/image 34), new. No pneumothorax.  Heart is normal in size.  No pericardial effusion.  Coronary atherosclerosis.  Atherosclerotic calcifications of the aortic arch.  9 mm short axis precarinal node (series 2/image 24 of 10, previously 7 mm.  No suspicious axillary lymphadenopathy.  The visualized upper abdomen is notable for postprocedural changes in the lateral interpolar left kidney, multiple hepatic cysts, and prior cholecystectomy.  Degenerative changes of the visualized thoracolumbar spine.  IMPRESSION: Layering moderate right pleural effusion with associated pleural thickening/nodularity.  Underlying compressive atelectasis of the right middle and lower lobes.  No definite endobronchial lesion is seen.  Consider thoracentesis and/or bronchoscopy for further evaluation.  7 mm left lower lobe pulmonary nodule, new.  If thoracentesis/bronchoscopy are negative, follow-up CT chest is suggested in 3-6 months (if high risk for primary bronchogenic neoplasm) or 6-12 months (if low risk).  This recommendation follows the consensus statement: Guidelines for Management of Small Pulmonary Nodules Detected on CT Scans: A Statement from the Fleischner Society as published in Radiology 2005; 237:395-400.   Original Report Authenticated By: Charline Bills, M.D.   Dg Esophagus  07/22/2012   *RADIOLOGY  REPORT*  Clinical Data:Dysphagia, history of squamous cell carcinoma of the pharynx treated with radiation, also a transitional carcinoma of the kidney  ESOPHAGUS/BARIUM SWALLOW/TABLET STUDY  Fluoroscopy Time: 1 minute 24 seconds  Comparison: None.  Findings: Initially rapid sequence spot films of the cervical esophagus were performed.  There is indentation upon the lower  cervical esophagus from the left of midline.  This could be due to scarring and radiation, but a left lower neck mass cannot be excluded.  There is prominence of the cricopharyngeus muscle noted mild tertiary contractions are present in the distal esophagus. There is a small hiatal hernia present.  A barium pill was given at the end of the study which passed into the stomach without delay.  IMPRESSION:  1.  Small hiatal hernia.  No definite reflux could be demonstrated. 2.  Indentation upon the posterior lower cervical esophagus from the left of midline.  This may be due to scarring from prior radiation, but a mass or adenopathy cannot be excluded. 3.  Prominence of the cricopharyngeus muscle.   Original Report Authenticated By: Dwyane Dee, M.D.   Nm Pet Image Initial (pi) Skull Base To Thigh  08/06/2012   *RADIOLOGY REPORT*  Clinical Data: Initial treatment strategy for oropharyngeal cancer.  NUCLEAR MEDICINE PET SKULL BASE TO THIGH  Fasting Blood Glucose:  102  Technique:  16.1 mCi F-18 FDG was injected intravenously. CT data was obtained and used for attenuation correction and anatomic localization only.  (This was not acquired as a diagnostic CT examination.) Additional exam technical data entered on technologist worksheet.  Comparison:  CT thorax 07/22/2012  Findings:  Neck: No hypermetabolic lymph nodes in the neck.  Chest:  Mass-like atelectasis in the medial right middle lobe at the level of the upper lobe bronchus with intense metabolic activity ( SUV max = 16.1) .  This masslike lesion measures 4.6 x 4.7 cm and concerning for  bronchogenic carcinoma.  There is a rind of hypermetabolic activity within the right pleural space  most consistent with transpleural spread of tumor.  There is a discrete nodule adjacent to the pleural within the upper mediastinum just lateral to the ascending aorta measuring 16 mm (image 91) with intense metabolic activity.  This is most consistent with a metastatic nodule.   There is additional soft tissue nodule at the junction of the pleural and medial costal margin measuring 18 mm (image 77) with intense metabolic activity (SUV max = 8.6).  There is a round focus of atelectasis within the right lower lobe measuring 5.3 cm which does  not have significant metabolic activity.  Within the left lower lobe is 10  mm nodule.  This appears to have mild metabolic activity.  Abdomen/Pelvis:  No abnormal hypermetabolic activity within the liver, pancreas, adrenal glands, or spleen.  No hypermetabolic lymph nodes in the abdomen or pelvis.  Skeleton:  No focal hypermetabolic activity to suggest skeletal metastasis.  IMPRESSION:  1.  Hypermetabolic right suprahilar mass is felt to represent a combination of atelectatic lung and bronchogenic carcinoma. 2.  Rind of hypermetabolic nodularity within the right pleural space is most consistent transpleural spread of tumor. 3.  Several discrete hypermetabolic nodules within the anterior mediastinum along the pleural surface consistent with metastatic implants.  One of these at the costosternal junction maybe accessible for biopsy. 4.  Questionable mild metabolic activity of left lower lobe pulmonary nodule is concerning but indeterminate. 5.  No evidence of metastasis outside the thorax.   Original Report Authenticated By: Genevive Bi, M.D.   Dg Chest Port 1 View  08/10/2012   *RADIOLOGY REPORT*  Clinical Data: Status post bronchoscopy and right middle lobe biopsy  PORTABLE CHEST - 1 VIEW  Comparison: Chest radiograph 07/29/2012  Findings: A moderate-to-large right pleural  effusion persists. There is no pleural effusion on the left.  No  visible pneumothorax. The left lung is clear.  Single surgical clip on the left at the thoracic inlet.  IMPRESSION:  1.  Moderate to large right pleural effusion is without significant change. 2.  Negative for pneumothorax.   Original Report Authenticated By: Britta Mccreedy, M.D.   US Thoracentesis Asp Pleural Space W/img Guide  07/29/2012   *RADIOLOGY REPORT*  Clinical Data:  History of oropharyngeal cancer.  Recent shortness of breath.  Right-sided pleural effusion found on recent CT scan. Request for diagnostic and therapeutic thoracentesis.  ULTRASOUND GUIDED right THORACENTESIS  Comparison:  None  An ultrasound guided thoracentesis was thoroughly discussed with the patient and questions answered.  The benefits, risks, alternatives and complications were also discussed.  The patient understands and wishes to proceed with the procedure.  Written consent was obtained.  Ultrasound was performed to localize and mark an adequate pocket of fluid in the right chest.  The area was then prepped and draped in the normal sterile fashion.  1% Lidocaine was used for local anesthesia.  Under ultrasound guidance a 19 gauge Yueh catheter was introduced.  The pleura was noted to be thickened and tough upon entry. Thoracentesis was performed.  The catheter was removed and a dressing applied.  Complications:  The patient experienced right-sided chest discomfort and pressure and requested the procedure be stopped.  Findings: A total of approximately 620 ml of hazy yellow fluid was removed. A fluid sample was sent for laboratory analysis.  IMPRESSION: Successful ultrasound guided right thoracentesis yielding 620 ml of pleural fluid.  Read by Brayton El PA-C   Original Report Authenticated By: Judie Petit. Miles Costain, M.D.   Dg C-arm Bronchoscopy  08/10/2012   CLINICAL DATA: cough   C-ARM BRONCHOSCOPY  Fluoroscopy was utilized by the requesting physician.  No radiographic   interpretation.     ASSESSMENT: This is a very pleasant 78 years old white male recently diagnosed with extensive stage small cell lung cancer with right suprahilar mass in addition to transpleural spread and right pleural effusion.   PLAN: I have a lengthy discussion with the patient and his wife today about his current disease stage, prognosis and treatment options. I explained to the patient that the median overall survival for patient with extensive stage small cell lung cancer is in the range of 9 months. He understands that he has incurable disease. I will complete the staging workup by ordering MRI of the brain to rule out brain metastasis. I discussed with the patient his treatment options including palliative care and hospice referral versus consideration of palliative systemic chemotherapy with carboplatin for AUC of 5 on day 1 and etoposide at 120 mg/M2 on days 1, 2 and 3 with Neulasta support on day 4. I discussed with the patient the adverse effect of the chemotherapy including but not limited to alopecia, myelosuppression, nausea and vomiting, peripheral neuropathy, liver or in dysfunction.  the patient would like to proceed with the chemotherapy. I will arrange for him to receive the first cycle of this treatment next week. He would have a chemotherapy education class before starting the first cycle of chemotherapy. I will also arrange for the patient to have a Port-A-Cath placed by interventional radiology for IV access. I will plan his pharmacy was prescription for Compazine 10 mg by mouth every 6 hours as needed for nausea in addition to Emla Cream to be applied to the Port-A-Cath site before treatment The patient would come back for followup visit in 2 weeks for reevaluation and management any  adverse effect of his chemotherapy.  The patient voices understanding of current disease status and treatment options and is in agreement with the current care plan.  All questions were  answered. The patient knows to call the clinic with any problems, questions or concerns. We can certainly see the patient much sooner if necessary.  Thank you so much for allowing me to participate in the care of Samuel Hurley.. I will continue to follow up the patient with you and assist in his care.  I spent 45 minutes counseling the patient face to face. The total time spent in the appointment was 60 minutes.  DISEASE STAGE: Extensive stage small cell lung cancer diagnosed in July 2014.  CHEMOTHERAPY INTENT: Palliative  CURRENT # OF CHEMOTHERAPY CYCLES: 0  CURRENT ANTIEMETICS: Compazine  CURRENT SMOKING STATUS: Never smoker  ORAL CHEMOTHERAPY AND CONSENT: None  CURRENT BISPHOSPHONATES USE: None  LIVING WILL AND CODE STATUS: Full code.  Jazlynne Milliner K. 08/14/2012, 11:57 AM

## 2012-08-14 NOTE — Patient Instructions (Signed)
DISEASE STAGE: Extensive stage small cell lung cancer diagnosed in July 2014.  CHEMOTHERAPY INTENT: Palliative  CURRENT # OF CHEMOTHERAPY CYCLES: 0  CURRENT ANTIEMETICS: Compazine  CURRENT SMOKING STATUS: Never smoker  ORAL CHEMOTHERAPY AND CONSENT: None  CURRENT BISPHOSPHONATES USE: None  LIVING WILL AND CODE STATUS: Full code.

## 2012-08-16 ENCOUNTER — Telehealth (HOSPITAL_COMMUNITY): Payer: Self-pay | Admitting: Radiology

## 2012-08-16 ENCOUNTER — Ambulatory Visit (HOSPITAL_COMMUNITY)
Admission: RE | Admit: 2012-08-16 | Discharge: 2012-08-16 | Disposition: A | Discharge status: Home / Self Care | Payer: Medicare Other | Source: Ambulatory Visit | Attending: Internal Medicine | Admitting: Internal Medicine

## 2012-08-16 DIAGNOSIS — C3491 Malignant neoplasm of unspecified part of right bronchus or lung: Secondary | ICD-10-CM

## 2012-08-16 MED ORDER — GADOBENATE DIMEGLUMINE 529 MG/ML IV SOLN
20.0000 mL | Freq: Once | INTRAVENOUS | Status: AC | PRN
  Administered 2012-08-16: 19 mL via INTRAVENOUS

## 2012-08-16 NOTE — Telephone Encounter (Signed)
Called patient to remind of appointment [facility] 08-17-12 arrive 12:30, npo after midnight, have a driver.  Patient wanted to know if we would be placing gastrostomy tube at the same time.  I informed patient that is not our plan, we are only going to place the portacath tomorrow, and will place gastrostomy at separate time (at this time there is no order for IR to place g tube).

## 2012-08-17 ENCOUNTER — Encounter (HOSPITAL_COMMUNITY): Payer: Self-pay

## 2012-08-17 ENCOUNTER — Ambulatory Visit (HOSPITAL_COMMUNITY)
Admission: RE | Admit: 2012-08-17 | Discharge: 2012-08-17 | Disposition: A | Discharge status: Home / Self Care | Payer: Medicare Other | Source: Ambulatory Visit | Attending: Internal Medicine | Admitting: Internal Medicine

## 2012-08-17 ENCOUNTER — Other Ambulatory Visit: Payer: Self-pay | Admitting: Internal Medicine

## 2012-08-17 ENCOUNTER — Encounter (HOSPITAL_COMMUNITY): Payer: Self-pay | Admitting: Pharmacy Technician

## 2012-08-17 ENCOUNTER — Other Ambulatory Visit: Payer: Self-pay | Admitting: *Deleted

## 2012-08-17 DIAGNOSIS — K219 Gastro-esophageal reflux disease without esophagitis: Secondary | ICD-10-CM | POA: Insufficient documentation

## 2012-08-17 DIAGNOSIS — C3491 Malignant neoplasm of unspecified part of right bronchus or lung: Secondary | ICD-10-CM

## 2012-08-17 DIAGNOSIS — E785 Hyperlipidemia, unspecified: Secondary | ICD-10-CM | POA: Insufficient documentation

## 2012-08-17 DIAGNOSIS — C3490 Malignant neoplasm of unspecified part of unspecified bronchus or lung: Secondary | ICD-10-CM

## 2012-08-17 DIAGNOSIS — Z9221 Personal history of antineoplastic chemotherapy: Secondary | ICD-10-CM | POA: Insufficient documentation

## 2012-08-17 DIAGNOSIS — Z85819 Personal history of malignant neoplasm of unspecified site of lip, oral cavity, and pharynx: Secondary | ICD-10-CM | POA: Insufficient documentation

## 2012-08-17 DIAGNOSIS — Z923 Personal history of irradiation: Secondary | ICD-10-CM | POA: Insufficient documentation

## 2012-08-17 DIAGNOSIS — G4733 Obstructive sleep apnea (adult) (pediatric): Secondary | ICD-10-CM | POA: Insufficient documentation

## 2012-08-17 LAB — APTT: aPTT: 32 seconds (ref 24–37)

## 2012-08-17 LAB — PROTIME-INR
INR: 1.07 (ref 0.00–1.49)
Prothrombin Time: 13.7 seconds (ref 11.6–15.2)

## 2012-08-17 LAB — CBC
HCT: 30.4 % — ABNORMAL LOW (ref 39.0–52.0)
MCHC: 32.9 g/dL (ref 30.0–36.0)
Platelets: 375 10*3/uL (ref 150–400)
RDW: 15 % (ref 11.5–15.5)

## 2012-08-17 MED ORDER — FENTANYL CITRATE 0.05 MG/ML IJ SOLN
INTRAMUSCULAR | Status: AC
  Filled 2012-08-17: qty 6

## 2012-08-17 MED ORDER — MIDAZOLAM HCL 2 MG/2ML IJ SOLN
INTRAMUSCULAR | Status: AC
  Filled 2012-08-17: qty 6

## 2012-08-17 MED ORDER — LIDOCAINE HCL 1 % IJ SOLN
INTRAMUSCULAR | Status: AC
  Filled 2012-08-17: qty 20

## 2012-08-17 MED ORDER — IOHEXOL 300 MG/ML  SOLN
100.0000 mL | Freq: Once | INTRAMUSCULAR | Status: AC | PRN
  Administered 2012-08-17: 100 mL via INTRAVENOUS

## 2012-08-17 MED ORDER — CEFAZOLIN SODIUM-DEXTROSE 2-3 GM-% IV SOLR
2.0000 g | INTRAVENOUS | Status: AC
  Administered 2012-08-17: 2 g via INTRAVENOUS
  Filled 2012-08-17: qty 50

## 2012-08-17 MED ORDER — HEPARIN SOD (PORK) LOCK FLUSH 100 UNIT/ML IV SOLN
INTRAVENOUS | Status: AC | PRN
  Administered 2012-08-17: 500 [IU]

## 2012-08-17 MED ORDER — SODIUM CHLORIDE 0.9 % IV SOLN
INTRAVENOUS | Status: DC
  Administered 2012-08-17: 13:00:00 via INTRAVENOUS

## 2012-08-17 MED ORDER — MIDAZOLAM HCL 2 MG/2ML IJ SOLN
INTRAMUSCULAR | Status: AC | PRN
  Administered 2012-08-17 (×2): 1 mg via INTRAVENOUS

## 2012-08-17 MED ORDER — FENTANYL CITRATE 0.05 MG/ML IJ SOLN
INTRAMUSCULAR | Status: AC | PRN
  Administered 2012-08-17: 50 ug via INTRAVENOUS
  Administered 2012-08-17: 100 ug via INTRAVENOUS

## 2012-08-17 NOTE — Progress Notes (Signed)
Pt was unable to get MRI brain due to claustrophobia.  Per Dr Donnald Garre, okay for pt to have CT head with contrast.  Onc tx schedule filled out, pt's wife knows that they will be called with an appt.  SLJ

## 2012-08-17 NOTE — H&P (Signed)
Chief Complaint: "I'm here for a port: Referring Physician:Mohamed HPI: Samuel Hurley. is an 78 y.o. male with newly diagnosed small cell lung cancer. He is to start chemotherapy soon. He reports there is also a small possibility he may have radiation. He is referred to IR for [facility] placement. PMHx and meds reviewed.   Past Medical History:  Past Medical History  Diagnosis Date  . Hyperlipidemia   . OSA (obstructive sleep apnea)   . BPH (benign prostatic hypertrophy)   . Kidney disease     TCC  . Xerostomia   . Pharynx cancer     squamous cell stage 4,s/p XRT,chemo, neck dissection  . Colon polyp     HYPERPLASTIC & TUBULAR ADENOMA(Colonoscopy-Dr.Philo)  . Diverticulosis of colon (without mention of hemorrhage)     (Colonoscopy-Dr.[place])  . Internal hemorrhoids without mention of complication     (Colonoscopy-Dr.Yantis)  . GERD (gastroesophageal reflux disease)     (EGD-Dr. Corinda Gubler)  . Atrophic gastritis without mention of hemorrhage     (EGD-Dr. Corinda Gubler)    Past Surgical History:  Past Surgical History  Procedure Laterality Date  . Appendectomy    . Lumbar laminectomy    . Tonsillectomy    . Bilateral ganglionectomies    . Radical right neck dissection  2008  . Pci-rfa renal cell (tcc) cancer  2009    pt denies  . Cholecystectomy  dec. 2009  . Septoplasty  03/01/12    with double turbinectomy  . Video bronchoscopy Bilateral 08/10/2012    Procedure: VIDEO BRONCHOSCOPY WITH FLUORO;  Surgeon: Oretha Milch, MD;  Location: [facility] ENDOSCOPY;  Service: Cardiopulmonary;  Laterality: Bilateral;    Family History:  Family History  Problem Relation Age of Onset  . Heart failure Father   . Kidney failure Father   . Other Father     Renal failure/ CHF  . Heart failure Mother   . Other Mother     CHF  . Colon cancer Neg Hx   . Prostate cancer Neg Hx   . Diabetes Neg Hx     Social History:  reports that he has never smoked. He has never used smokeless tobacco. He  reports that he does not drink alcohol or use illicit drugs.  Allergies:  Allergies  Allergen Reactions  . Erythromycin     REACTION: Diarrhea  . Tetracycline     unknown    Medications:   Medication List    ASK your doctor about these medications       acetaminophen-codeine 300-30 MG per tablet  Commonly known as:  TYLENOL #3  Take 1 tablet by mouth every 6 (six) hours as needed for pain.     benzonatate 200 MG capsule  Commonly known as:  TESSALON  Take 200 mg by mouth 3 (three) times daily as needed for cough.     chlorhexidine 0.12 % solution  Commonly known as:  PERIDEX  Use as directed 15 mLs in the mouth or throat 2 (two) times daily. 1/2 oz rinse and spit.     cyanocobalamin 2000 MCG tablet  Take 2,000 mcg by mouth daily.     dexlansoprazole 60 MG capsule  Commonly known as:  DEXILANT  Take 1 capsule (60 mg total) by mouth daily.     dextromethorphan-guaiFENesin 30-600 MG per 12 hr tablet  Commonly known as:  MUCINEX DM  Take 1 tablet by mouth every 12 (twelve) hours.     finasteride 5 MG tablet  Commonly known  as:  PROSCAR  Take 5 mg by mouth daily.     levothyroxine 75 MCG tablet  Commonly known as:  SYNTHROID, LEVOTHROID  Take 75 mcg by mouth daily.     lidocaine-prilocaine cream  Commonly known as:  EMLA  Apply topically as needed.     LORazepam 1 MG tablet  Commonly known as:  ATIVAN  1 tablet by mouth once for MRI     prochlorperazine 10 MG tablet  Commonly known as:  COMPAZINE  Take 1 tablet (10 mg total) by mouth every 6 (six) hours as needed.     terazosin 1 MG capsule  Commonly known as:  HYTRIN  Take 1 mg by mouth 2 (two) times daily.        Please HPI for pertinent positives, otherwise complete 10 system ROS negative.  Physical Exam: BP 128/68  Pulse 68  Temp(Src) 98.6 F (37 C) (Oral)  Resp 18  SpO2 97% There is no weight on file to calculate BMI.   General Appearance:  Alert, cooperative, no distress, appears stated  age  Head:  Normocephalic, without obvious abnormality, atraumatic  ENT: Unremarkable  Neck: Supple, symmetrical, trachea midline  Lungs:   Clear to auscultation on left, diminished BS on right  Chest Wall:  No tenderness or deformity  Heart:  Regular rate and rhythm, S1, S2 normal, no murmur, rub or gallop.  Neurologic: Normal affect, no gross deficits.   Results for orders placed during the hospital encounter of 08/17/12 (from the past 48 hour(s))  APTT     Status: None   Collection Time    08/17/12  1:05 PM      Result Value Range   aPTT 32  24 - 37 seconds  CBC     Status: Abnormal   Collection Time    08/17/12  1:05 PM      Result Value Range   WBC 7.3  4.0 - 10.5 K/uL   RBC 3.36 (*) 4.22 - 5.81 MIL/uL   Hemoglobin 10.0 (*) 13.0 - 17.0 g/dL   HCT 16.1 (*) 09.6 - 04.5 %   MCV 90.5  78.0 - 100.0 fL   MCH 29.8  26.0 - 34.0 pg   MCHC 32.9  30.0 - 36.0 g/dL   RDW 40.9  81.1 - 91.4 %   Platelets 375  150 - 400 K/uL  PROTIME-INR     Status: None   Collection Time    08/17/12  1:05 PM      Result Value Range   Prothrombin Time 13.7  11.6 - 15.2 seconds   INR 1.07  0.00 - 1.49   No results found.  Assessment/Plan Small cell lung cancer For port placement. Discussed procedure, risks, complications, use of sedation Labs reviewed, ok Consent signed in chart  Brayton El PA-C 08/17/2012, 2:08 PM

## 2012-08-17 NOTE — Procedures (Signed)
LIJV PAC tip SVC RA No comp 

## 2012-08-18 ENCOUNTER — Other Ambulatory Visit (HOSPITAL_BASED_OUTPATIENT_CLINIC_OR_DEPARTMENT_OTHER): Payer: Medicare Other | Admitting: Lab

## 2012-08-18 ENCOUNTER — Ambulatory Visit (HOSPITAL_BASED_OUTPATIENT_CLINIC_OR_DEPARTMENT_OTHER): Payer: Medicare Other

## 2012-08-18 ENCOUNTER — Other Ambulatory Visit: Payer: Medicare Other

## 2012-08-18 ENCOUNTER — Encounter: Payer: Self-pay | Admitting: *Deleted

## 2012-08-18 VITALS — BP 141/54 | HR 70 | Temp 99.4°F | Resp 18

## 2012-08-18 DIAGNOSIS — C3491 Malignant neoplasm of unspecified part of right bronchus or lung: Secondary | ICD-10-CM

## 2012-08-18 DIAGNOSIS — Z5111 Encounter for antineoplastic chemotherapy: Secondary | ICD-10-CM

## 2012-08-18 DIAGNOSIS — C3490 Malignant neoplasm of unspecified part of unspecified bronchus or lung: Secondary | ICD-10-CM

## 2012-08-18 LAB — CBC WITH DIFFERENTIAL/PLATELET
BASO%: 0.8 % (ref 0.0–2.0)
EOS%: 0.8 % (ref 0.0–7.0)
HCT: 30.4 % — ABNORMAL LOW (ref 38.4–49.9)
MCH: 29.9 pg (ref 27.2–33.4)
MCHC: 33.2 g/dL (ref 32.0–36.0)
NEUT%: 81.2 % — ABNORMAL HIGH (ref 39.0–75.0)
RBC: 3.37 10*6/uL — ABNORMAL LOW (ref 4.20–5.82)
lymph#: 0.5 10*3/uL — ABNORMAL LOW (ref 0.9–3.3)

## 2012-08-18 LAB — COMPREHENSIVE METABOLIC PANEL (CC13)
ALT: 7 U/L (ref 0–55)
AST: 13 U/L (ref 5–34)
Chloride: 105 mEq/L (ref 98–109)
Creatinine: 1 mg/dL (ref 0.7–1.3)
Total Bilirubin: <0.2 mg/dL (ref 0.20–1.20)

## 2012-08-18 MED ORDER — SODIUM CHLORIDE 0.9 % IV SOLN
120.0000 mg/m2 | Freq: Once | INTRAVENOUS | Status: AC
  Administered 2012-08-18: 260 mg via INTRAVENOUS
  Filled 2012-08-18: qty 13

## 2012-08-18 MED ORDER — DEXAMETHASONE SODIUM PHOSPHATE 20 MG/5ML IJ SOLN
20.0000 mg | Freq: Once | INTRAMUSCULAR | Status: AC
  Administered 2012-08-18: 20 mg via INTRAVENOUS

## 2012-08-18 MED ORDER — SODIUM CHLORIDE 0.9 % IV SOLN
Freq: Once | INTRAVENOUS | Status: AC
  Administered 2012-08-18: 14:00:00 via INTRAVENOUS

## 2012-08-18 MED ORDER — ONDANSETRON 16 MG/50ML IVPB (CHCC)
16.0000 mg | Freq: Once | INTRAVENOUS | Status: AC
  Administered 2012-08-18: 16 mg via INTRAVENOUS

## 2012-08-18 MED ORDER — SODIUM CHLORIDE 0.9 % IV SOLN
579.0000 mg | Freq: Once | INTRAVENOUS | Status: AC
  Administered 2012-08-18: 580 mg via INTRAVENOUS
  Filled 2012-08-18: qty 58

## 2012-08-18 NOTE — Patient Instructions (Addendum)
[facility] Discharge Instructions for Patients Receiving Chemotherapy  Today you received the following chemotherapy agents Carboplatin and Etoposide.  To help prevent nausea and vomiting after your treatment, we encourage you to take your nausea medication.   If you develop nausea and vomiting that is not controlled by your nausea medication, call the clinic.   BELOW ARE SYMPTOMS THAT SHOULD BE REPORTED IMMEDIATELY:  *FEVER GREATER THAN 100.5 F  *CHILLS WITH OR WITHOUT FEVER  NAUSEA AND VOMITING THAT IS NOT CONTROLLED WITH YOUR NAUSEA MEDICATION  *UNUSUAL SHORTNESS OF BREATH  *UNUSUAL BRUISING OR BLEEDING  TENDERNESS IN MOUTH AND THROAT WITH OR WITHOUT PRESENCE OF ULCERS  *URINARY PROBLEMS  *BOWEL PROBLEMS  UNUSUAL RASH Items with * indicate a potential emergency and should be followed up as soon as possible.  Feel free to call the clinic you have any questions or concerns. The clinic phone number is [phone].    

## 2012-08-19 ENCOUNTER — Ambulatory Visit (HOSPITAL_BASED_OUTPATIENT_CLINIC_OR_DEPARTMENT_OTHER): Payer: Medicare Other

## 2012-08-19 DIAGNOSIS — G4700 Insomnia, unspecified: Secondary | ICD-10-CM

## 2012-08-19 DIAGNOSIS — C3490 Malignant neoplasm of unspecified part of unspecified bronchus or lung: Secondary | ICD-10-CM

## 2012-08-19 DIAGNOSIS — Z5111 Encounter for antineoplastic chemotherapy: Secondary | ICD-10-CM

## 2012-08-19 DIAGNOSIS — C3491 Malignant neoplasm of unspecified part of right bronchus or lung: Secondary | ICD-10-CM

## 2012-08-19 MED ORDER — LORAZEPAM 0.5 MG PO TABS: 0.5000 mg | ORAL_TABLET | Freq: Every evening | ORAL | Status: DC | PRN

## 2012-08-19 MED ORDER — SODIUM CHLORIDE 0.9 % IJ SOLN
10.0000 mL | INTRAMUSCULAR | Status: DC | PRN
  Administered 2012-08-19: 10 mL
  Filled 2012-08-19: qty 10

## 2012-08-19 MED ORDER — DEXAMETHASONE SODIUM PHOSPHATE 10 MG/ML IJ SOLN
10.0000 mg | Freq: Once | INTRAMUSCULAR | Status: AC
  Administered 2012-08-19: 10 mg via INTRAVENOUS

## 2012-08-19 MED ORDER — SODIUM CHLORIDE 0.9 % IV SOLN
120.0000 mg/m2 | Freq: Once | INTRAVENOUS | Status: AC
  Administered 2012-08-19: 260 mg via INTRAVENOUS
  Filled 2012-08-19: qty 13

## 2012-08-19 MED ORDER — HEPARIN SOD (PORK) LOCK FLUSH 100 UNIT/ML IV SOLN
500.0000 [IU] | Freq: Once | INTRAVENOUS | Status: AC | PRN
  Administered 2012-08-19: 500 [IU]
  Filled 2012-08-19: qty 5

## 2012-08-19 MED ORDER — ONDANSETRON 8 MG/50ML IVPB (CHCC)
8.0000 mg | Freq: Once | INTRAVENOUS | Status: AC
  Administered 2012-08-19: 8 mg via INTRAVENOUS

## 2012-08-19 MED ORDER — SODIUM CHLORIDE 0.9 % IV SOLN
Freq: Once | INTRAVENOUS | Status: AC
  Administered 2012-08-19: 13:00:00 via INTRAVENOUS

## 2012-08-19 NOTE — Patient Instructions (Addendum)
[facility] Discharge Instructions for Patients Receiving Chemotherapy  Today you received the following chemotherapy agents Etoposide.   To help prevent nausea and vomiting after your treatment, we encourage you to take your nausea medication as directed.    If you develop nausea and vomiting that is not controlled by your nausea medication, call the clinic.   BELOW ARE SYMPTOMS THAT SHOULD BE REPORTED IMMEDIATELY:  *FEVER GREATER THAN 100.5 F  *CHILLS WITH OR WITHOUT FEVER  NAUSEA AND VOMITING THAT IS NOT CONTROLLED WITH YOUR NAUSEA MEDICATION  *UNUSUAL SHORTNESS OF BREATH  *UNUSUAL BRUISING OR BLEEDING  TENDERNESS IN MOUTH AND THROAT WITH OR WITHOUT PRESENCE OF ULCERS  *URINARY PROBLEMS  *BOWEL PROBLEMS  UNUSUAL RASH Items with * indicate a potential emergency and should be followed up as soon as possible.  Feel free to call the clinic you have any questions or concerns. The clinic phone number is [phone].    

## 2012-08-19 NOTE — Progress Notes (Signed)
Called in ativan to pt pharmacy.

## 2012-08-20 ENCOUNTER — Ambulatory Visit (HOSPITAL_BASED_OUTPATIENT_CLINIC_OR_DEPARTMENT_OTHER): Payer: Medicare Other

## 2012-08-20 ENCOUNTER — Other Ambulatory Visit: Payer: Self-pay | Admitting: Certified Registered Nurse Anesthetist

## 2012-08-20 VITALS — BP 131/71 | HR 72 | Temp 97.5°F | Resp 18

## 2012-08-20 DIAGNOSIS — C342 Malignant neoplasm of middle lobe, bronchus or lung: Secondary | ICD-10-CM

## 2012-08-20 DIAGNOSIS — C3491 Malignant neoplasm of unspecified part of right bronchus or lung: Secondary | ICD-10-CM

## 2012-08-20 DIAGNOSIS — Z5111 Encounter for antineoplastic chemotherapy: Secondary | ICD-10-CM

## 2012-08-20 MED ORDER — ONDANSETRON 8 MG/50ML IVPB (CHCC)
8.0000 mg | Freq: Once | INTRAVENOUS | Status: AC
  Administered 2012-08-20: 8 mg via INTRAVENOUS

## 2012-08-20 MED ORDER — HEPARIN SOD (PORK) LOCK FLUSH 100 UNIT/ML IV SOLN
500.0000 [IU] | Freq: Once | INTRAVENOUS | Status: AC | PRN
  Administered 2012-08-20: 500 [IU]
  Filled 2012-08-20: qty 5

## 2012-08-20 MED ORDER — SODIUM CHLORIDE 0.9 % IV SOLN
Freq: Once | INTRAVENOUS | Status: AC
  Administered 2012-08-20: 15:00:00 via INTRAVENOUS

## 2012-08-20 MED ORDER — DEXAMETHASONE SODIUM PHOSPHATE 10 MG/ML IJ SOLN
10.0000 mg | Freq: Once | INTRAMUSCULAR | Status: AC
  Administered 2012-08-20: 10 mg via INTRAVENOUS

## 2012-08-20 MED ORDER — SODIUM CHLORIDE 0.9 % IJ SOLN
10.0000 mL | INTRAMUSCULAR | Status: DC | PRN
  Administered 2012-08-20: 10 mL
  Filled 2012-08-20: qty 10

## 2012-08-20 MED ORDER — ETOPOSIDE CHEMO INJECTION 1 GM/50ML
120.0000 mg/m2 | Freq: Once | INTRAVENOUS | Status: AC
  Administered 2012-08-20: 260 mg via INTRAVENOUS
  Filled 2012-08-20: qty 13

## 2012-08-20 NOTE — Patient Instructions (Addendum)
[facility] Discharge Instructions for Patients Receiving Chemotherapy  Today you received the following chemotherapy agents:  etoposide  To help prevent nausea and vomiting after your treatment, we encourage you to take your nausea medication.  Take it as often as prescribed.     If you develop nausea and vomiting that is not controlled by your nausea medication, call the clinic. If it is after clinic hours your family physician or the after hours number for the clinic or go to the Emergency Department.   BELOW ARE SYMPTOMS THAT SHOULD BE REPORTED IMMEDIATELY:  *FEVER GREATER THAN 100.5 F  *CHILLS WITH OR WITHOUT FEVER  NAUSEA AND VOMITING THAT IS NOT CONTROLLED WITH YOUR NAUSEA MEDICATION  *UNUSUAL SHORTNESS OF BREATH  *UNUSUAL BRUISING OR BLEEDING  TENDERNESS IN MOUTH AND THROAT WITH OR WITHOUT PRESENCE OF ULCERS  *URINARY PROBLEMS  *BOWEL PROBLEMS  UNUSUAL RASH Items with * indicate a potential emergency and should be followed up as soon as possible.  Feel free to call the clinic you have any questions or concerns. The clinic phone number is [phone].   I have been informed and understand all the instructions given to me. I know to contact the clinic, my physician, or go to the Emergency Department if any problems should occur. I do not have any questions at this time, but understand that I may call the clinic during office hours   should I have any questions or need assistance in obtaining follow up care.    __________________________________________  _____________  __________ Signature of Patient or Authorized Representative            Date                   Time    __________________________________________ Nurse's Signature    

## 2012-08-21 ENCOUNTER — Ambulatory Visit (HOSPITAL_BASED_OUTPATIENT_CLINIC_OR_DEPARTMENT_OTHER): Payer: Medicare Other

## 2012-08-21 VITALS — BP 163/70 | HR 68 | Temp 98.0°F | Resp 20

## 2012-08-21 DIAGNOSIS — C3491 Malignant neoplasm of unspecified part of right bronchus or lung: Secondary | ICD-10-CM

## 2012-08-21 DIAGNOSIS — Z5189 Encounter for other specified aftercare: Secondary | ICD-10-CM

## 2012-08-21 DIAGNOSIS — C342 Malignant neoplasm of middle lobe, bronchus or lung: Secondary | ICD-10-CM

## 2012-08-21 MED ORDER — PEGFILGRASTIM INJECTION 6 MG/0.6ML
6.0000 mg | Freq: Once | SUBCUTANEOUS | Status: AC
  Administered 2012-08-21: 6 mg via SUBCUTANEOUS

## 2012-08-21 NOTE — Patient Instructions (Addendum)

## 2012-08-23 ENCOUNTER — Encounter: Payer: Self-pay | Admitting: *Deleted

## 2012-08-23 ENCOUNTER — Telehealth: Payer: Self-pay | Admitting: *Deleted

## 2012-08-23 DIAGNOSIS — C3490 Malignant neoplasm of unspecified part of unspecified bronchus or lung: Secondary | ICD-10-CM

## 2012-08-23 NOTE — Telephone Encounter (Signed)
Attempted to call pt.  Message left to return call to discuss how he is doing post chemo.

## 2012-08-23 NOTE — Telephone Encounter (Signed)
Message copied by Sabino Snipes on Mon Aug 23, 2012  3:23 PM ------      Message from: Barbara Cower      Created: Wed Aug 18, 2012  2:09 PM      Regarding: chemo followup       1st carboplatin, etoposide            Dr. Arbutus Ped ------

## 2012-08-23 NOTE — Telephone Encounter (Signed)
Received return call from pt's wife stating that pt doesn't feel well & is very fatigued & is having a lot of trouble breathing.  He is not on oxygen & she states his O2 sats have been OK.  He thinks he needs another thoracentesis & would like to have a feeding tube due to unable to get in enough calories b/c he wears out eating & would like to have both under conscious sedation.  He states his appetite is fine. He is having to sit up to sleep.  He reports that he took ativan once & had "heart pain" & thinks the ativan caused this.  He is taking tylenol with codeine for intermittent pain. Discussed with Dr Arbutus Ped & will order CXR  & thoracentesis if needed per his verbal order. Scheduled with central scheduling & scheduler will call pt.  I was told by IR that this could not be done with conscious sedation especially if he is having trouble breathing.

## 2012-08-24 ENCOUNTER — Ambulatory Visit (HOSPITAL_COMMUNITY)
Admission: RE | Admit: 2012-08-24 | Discharge: 2012-08-24 | Disposition: A | Discharge status: Home / Self Care | Payer: Medicare Other | Source: Ambulatory Visit | Attending: Radiology | Admitting: Radiology

## 2012-08-24 ENCOUNTER — Other Ambulatory Visit: Payer: Self-pay | Admitting: Radiology

## 2012-08-24 ENCOUNTER — Ambulatory Visit (HOSPITAL_COMMUNITY)
Admission: RE | Admit: 2012-08-24 | Discharge: 2012-08-24 | Disposition: A | Discharge status: Home / Self Care | Payer: Medicare Other | Source: Ambulatory Visit | Attending: Internal Medicine | Admitting: Internal Medicine

## 2012-08-24 DIAGNOSIS — C3490 Malignant neoplasm of unspecified part of unspecified bronchus or lung: Secondary | ICD-10-CM

## 2012-08-24 DIAGNOSIS — J90 Pleural effusion, not elsewhere classified: Secondary | ICD-10-CM | POA: Insufficient documentation

## 2012-08-24 NOTE — Procedures (Signed)
Successful US guided right thoracentesis. Yielded of clear yellow fluid. Pt tolerated procedure well. No immediate complications.  Specimen was not sent for labs. CXR ordered.  Brayton El PA-C 08/24/2012 2:11 PM

## 2012-08-25 ENCOUNTER — Other Ambulatory Visit: Payer: Self-pay | Admitting: Medical Oncology

## 2012-08-25 ENCOUNTER — Encounter: Payer: Self-pay | Admitting: Internal Medicine

## 2012-08-25 ENCOUNTER — Telehealth: Payer: Self-pay | Admitting: Internal Medicine

## 2012-08-25 ENCOUNTER — Other Ambulatory Visit (HOSPITAL_BASED_OUTPATIENT_CLINIC_OR_DEPARTMENT_OTHER): Payer: Medicare Other | Admitting: Lab

## 2012-08-25 ENCOUNTER — Ambulatory Visit (HOSPITAL_BASED_OUTPATIENT_CLINIC_OR_DEPARTMENT_OTHER): Payer: Medicare Other | Admitting: Internal Medicine

## 2012-08-25 VITALS — BP 127/66 | HR 87 | Temp 98.8°F | Resp 18 | Ht 72.0 in | Wt 206.8 lb

## 2012-08-25 DIAGNOSIS — C3491 Malignant neoplasm of unspecified part of right bronchus or lung: Secondary | ICD-10-CM

## 2012-08-25 DIAGNOSIS — C3490 Malignant neoplasm of unspecified part of unspecified bronchus or lung: Secondary | ICD-10-CM

## 2012-08-25 LAB — COMPREHENSIVE METABOLIC PANEL (CC13)
Alkaline Phosphatase: 124 U/L (ref 40–150)
CO2: 24 mEq/L (ref 22–29)
Creatinine: 1 mg/dL (ref 0.7–1.3)
Glucose: 121 mg/dl (ref 70–140)
Sodium: 136 mEq/L (ref 136–145)
Total Bilirubin: 0.39 mg/dL (ref 0.20–1.20)
Total Protein: 6.8 g/dL (ref 6.4–8.3)

## 2012-08-25 LAB — CBC WITH DIFFERENTIAL/PLATELET
EOS%: 4.6 % (ref 0.0–7.0)
LYMPH%: 22.5 % (ref 14.0–49.0)
MCH: 28.9 pg (ref 27.2–33.4)
MCV: 89.6 fL (ref 79.3–98.0)
MONO%: 1.3 % (ref 0.0–14.0)
Platelets: 157 10*3/uL (ref 140–400)
RBC: 3.18 10*6/uL — ABNORMAL LOW (ref 4.20–5.82)
RDW: 15 % — ABNORMAL HIGH (ref 11.0–14.6)
nRBC: 0 % (ref 0–0)

## 2012-08-25 NOTE — Progress Notes (Signed)
[facility] Telephone:([phone]) [phone]   Fax:([phone]) [phone]  OFFICE PROGRESS NOTE  Illene Regulus, MD [address] [place] [address]  DIAGNOSIS AND STAGE: Extensive stage small cell lung cancer diagnosed in July 2014.   PRIOR THERAPY: None   CURRENT THERAPY: Systemic chemotherapy with carboplatin for AUC of 5 on day 1 and etoposide 120 mg/M2 on days 1, 2 and 3 with Neulasta support on day 4, status post one cycle. First dose was given on 08/18/2012.  CHEMOTHERAPY INTENT: Palliative  CURRENT # OF CHEMOTHERAPY CYCLES: 0  CURRENT ANTIEMETICS: Zofran, dexamethasone and Compazine  CURRENT SMOKING STATUS: Never smoker  ORAL CHEMOTHERAPY AND CONSENT: None  CURRENT BISPHOSPHONATES USE: None  PAIN MANAGEMENT: 2/10, Tyleno#3 NARCOTICS INDUCED CONSTIPATION: None LIVING WILL AND CODE STATUS: Full code.   INTERVAL HISTORY: Samuel Hurley 78 y.o. male returns to the clinic today for followup visit accompanied by his wife Samuel Hurley. The patient related the first week of his systemic chemotherapy with carboplatin and etoposide fairly well with no significant adverse effects. He denied having any nausea or vomiting. He denied having any fever or chills. He gained 3 pounds since his last visit. The patient was complaining of increasing shortness of breath as well as fullness in the right side of his chest and he underwent a right sided thoracentesis yesterday with drainage of 750 mL of pleural fluid. He felt much better after the thoracentesis. He also had a Port-A-Cath placed before the first cycle of his chemotherapy. He is here today for evaluation and management any adverse effects of his treatment.  MEDICAL HISTORY: Past Medical History  Diagnosis Date  . Hyperlipidemia   . OSA (obstructive sleep apnea)   . BPH (benign prostatic hypertrophy)   . Kidney disease     TCC  . Xerostomia   . Pharynx cancer     squamous cell stage 4,s/p XRT,chemo, neck dissection  .  Colon polyp     HYPERPLASTIC & TUBULAR ADENOMA(Colonoscopy-Dr.Brooklawn)  . Diverticulosis of colon (without mention of hemorrhage)     (Colonoscopy-Dr.Modale)  . Internal hemorrhoids without mention of complication     (Colonoscopy-Dr.Amagon)  . GERD (gastroesophageal reflux disease)     (EGD-Dr. Corinda Gubler)  . Atrophic gastritis without mention of hemorrhage     (EGD-Dr. Corinda Gubler)    ALLERGIES:  is allergic to erythromycin and tetracycline.  MEDICATIONS:  Current Outpatient Prescriptions  Medication Sig Dispense Refill  . acetaminophen-codeine (TYLENOL #3) 300-30 MG per tablet Take 1 tablet by mouth every 6 (six) hours as needed for pain.  30 tablet  0  . benzonatate (TESSALON) 200 MG capsule Take 200 mg by mouth 3 (three) times daily as needed for cough.      . chlorhexidine (PERIDEX) 0.12 % solution Use as directed 15 mLs in the mouth or throat 2 (two) times daily. 1/2 oz rinse and spit.      . cyanocobalamin 2000 MCG tablet Take 2,000 mcg by mouth daily.        Marland Kitchen dexlansoprazole (DEXILANT) 60 MG capsule Take 1 capsule (60 mg total) by mouth daily.  5 capsule  0  . dextromethorphan-guaiFENesin (MUCINEX DM) 30-600 MG per 12 hr tablet Take 1 tablet by mouth every 12 (twelve) hours.      . finasteride (PROSCAR) 5 MG tablet Take 5 mg by mouth daily.        Marland Kitchen levothyroxine (SYNTHROID, LEVOTHROID) 75 MCG tablet Take 75 mcg by mouth daily.        Marland Kitchen  lidocaine-prilocaine (EMLA) cream Apply topically as needed.  30 g  0  . terazosin (HYTRIN) 1 MG capsule Take 1 mg by mouth 2 (two) times daily.       Marland Kitchen LORazepam (ATIVAN) 0.5 MG tablet Take 1 tablet (0.5 mg total) by mouth at bedtime as needed (insomnia).  30 tablet  0  . LORazepam (ATIVAN) 1 MG tablet 1 tablet by mouth once for MRI  2 tablet  0  . prochlorperazine (COMPAZINE) 10 MG tablet Take 1 tablet (10 mg total) by mouth every 6 (six) hours as needed.  60 tablet  0   Current Facility-Administered Medications  Medication Dose Route Frequency  Provider Last Rate Last Dose  . TDaP (BOOSTRIX) injection 0.5 mL  0.5 mL Intramuscular Once Jacques Navy, MD        SURGICAL HISTORY:  Past Surgical History  Procedure Laterality Date  . Appendectomy    . Lumbar laminectomy    . Tonsillectomy    . Bilateral ganglionectomies    . Radical right neck dissection  2008  . Pci-rfa renal cell (tcc) cancer  2009    pt denies  . Cholecystectomy  dec. 2009  . Septoplasty  03/01/12    with double turbinectomy  . Video bronchoscopy Bilateral 08/10/2012    Procedure: VIDEO BRONCHOSCOPY WITH FLUORO;  Surgeon: Oretha Milch, MD;  Location: [facility] ENDOSCOPY;  Service: Cardiopulmonary;  Laterality: Bilateral;    REVIEW OF SYSTEMS:  A comprehensive review of systems was negative except for: Constitutional: positive for fatigue Respiratory: positive for dyspnea on exertion   PHYSICAL EXAMINATION: General appearance: alert, cooperative and no distress Head: Normocephalic, without obvious abnormality, atraumatic Neck: no adenopathy Lymph nodes: Cervical, supraclavicular, and axillary nodes normal. Resp: clear to auscultation bilaterally Cardio: regular rate and rhythm, S1, S2 normal, no murmur, click, rub or gallop GI: soft, non-tender; bowel sounds normal; no masses,  no organomegaly Extremities: extremities normal, atraumatic, no cyanosis or edema Neurologic: Alert and oriented X 3, normal strength and tone. Normal symmetric reflexes. Normal coordination and gait  ECOG PERFORMANCE STATUS: 1 - Symptomatic but completely ambulatory  Blood pressure 127/66, pulse 87, temperature 98.8 F (37.1 C), temperature source Oral, resp. rate 18, height 6' (1.829 m), weight 206 lb 12.8 oz (93.804 kg).  LABORATORY DATA: Lab Results  Component Value Date   WBC 1.5* 08/25/2012   HGB 9.2* 08/25/2012   HCT 28.5* 08/25/2012   MCV 89.6 08/25/2012   PLT 157 08/25/2012      Chemistry      Component Value Date/Time   NA 136 08/25/2012 1325   NA 138 05/13/2012 1005   K  4.4 08/25/2012 1325   K 3.7 05/13/2012 1005   CL 101 05/13/2012 1005   CO2 24 08/25/2012 1325   CO2 30 05/13/2012 1005   BUN 22.6 08/25/2012 1325   BUN 12 05/13/2012 1005   CREATININE 1.0 08/25/2012 1325   CREATININE 1.0 05/13/2012 1005      Component Value Date/Time   CALCIUM 8.7 08/25/2012 1325   CALCIUM 8.7 05/13/2012 1005   ALKPHOS 124 08/25/2012 1325   ALKPHOS 76 05/13/2012 1005   AST 15 08/25/2012 1325   AST 16 05/13/2012 1005   ALT 13 08/25/2012 1325   ALT 11 05/13/2012 1005   BILITOT 0.39 08/25/2012 1325   BILITOT 0.5 05/13/2012 1005       RADIOGRAPHIC STUDIES: Dg Chest 1 View  08/24/2012   *RADIOLOGY REPORT*  Clinical Data: Post right thoracentesis.  CHEST -  1 VIEW  Comparison: 08/24/2012  Findings: Large right pleural effusion.  Atelectasis or infiltrate in the right lower lobe.  The effusion may have decreased slightly since prior study.  No pneumothorax following thoracentesis.  Left Port-A-Cath is unchanged.  Left lung is clear.  Heart is normal size.  IMPRESSION: Slight interval decrease in the size of the large right pleural effusion.  No pneumothorax.   Original Report Authenticated By: Charlett Nose, M.D.   Dg Chest 1 View  07/29/2012   *RADIOLOGY REPORT*  Clinical Data: Right effusion status post thoracentesis  CHEST - 1 VIEW  Comparison: 07/22/2012  Findings: The patient is status post 600 ml right thoracentesis. Moderate to large right effusion persist.  Procedure was stopped because of chest pain and coughing.  No significant pneumothorax. Left lung remains clear.  Stable heart size and vascularity. Residual right middle and lower lobe atelectasis / consolidation.  IMPRESSION: No pneumothorax following 600 ml right thoracentesis.  Stable chest exam.   Original Report Authenticated By: Judie Petit. Miles Costain, M.D.   Dg Chest 2 View  08/24/2012   *RADIOLOGY REPORT*  Clinical Data: Metastatic small cell carcinoma of the lung. Shortness of breath.  Follow up right pleural effusion.  CHEST - 2 VIEW  Comparison:  Portable chest x-ray 08/10/2012.  PET CT 08/06/2012.  Findings: Large right pleural effusion, unchanged since the prior examinations.  Associated dense consolidation in the right lower lobe and right middle lobe.  Lungs otherwise clear with no new pulmonary parenchymal abnormalities.  Cardiac silhouette normal in size, unchanged.  Left jugular Port-A-Cath tip in the mid SVC. Mild degenerative changes involving the thoracic spine.  IMPRESSION: Large right pleural effusion and associated dense passive atelectasis in the right middle lobe and right lower lobe, stable since the PET CT 08/06/2012.  No new abnormalities.   Original Report Authenticated By: Hulan Saas, M.D.   Ct Head W Wo Contrast  08/17/2012   *RADIOLOGY REPORT*  Clinical Data: Small cell lung cancer.  Renal cell  cancer.  Tongue cancer.  Headache.  CT HEAD WITHOUT AND WITH CONTRAST  Technique:  Contiguous axial images were obtained from the base of the skull through the vertex without and with intravenous contrast.  Contrast: OMNIPAQUE IOHEXOL 300 MG/ML  SOLN  Comparison: None.  Findings: Ventricle size is normal.  Negative for intracranial hemorrhage.  Negative for acute infarct or mass.  No significant chronic ischemia.  Postcontrast imaging reveals normal enhancement.  Negative for metastatic disease to the brain.  Small air-fluid level left maxillary sinus and right sphenoid sinus.  Mucosal edema in the ethmoid sinuses.  No skull lesion identified.  IMPRESSION: Negative for metastatic disease.  Sinusitis.   Original Report Authenticated By: Janeece Riggers, M.D.   Nm Pet Image Initial (pi) Skull Base To Thigh  08/06/2012   *RADIOLOGY REPORT*  Clinical Data: Initial treatment strategy for oropharyngeal cancer.  NUCLEAR MEDICINE PET SKULL BASE TO THIGH  Fasting Blood Glucose:  102  Technique:  16.1 mCi F-18 FDG was injected intravenously. CT data was obtained and used for attenuation correction and anatomic localization only.  (This was  not acquired as a diagnostic CT examination.) Additional exam technical data entered on technologist worksheet.  Comparison:  CT thorax 07/22/2012  Findings:  Neck: No hypermetabolic lymph nodes in the neck.  Chest:  Mass-like atelectasis in the medial right middle lobe at the level of the upper lobe bronchus with intense metabolic activity ( SUV max = 16.1) .  This masslike lesion measures  4.6 x 4.7 cm and concerning for bronchogenic carcinoma.  There is a rind of hypermetabolic activity within the right pleural space  most consistent with transpleural spread of tumor.  There is a discrete nodule adjacent to the pleural within the upper mediastinum just lateral to the ascending aorta measuring 16 mm (image 91) with intense metabolic activity.  This is most consistent with a metastatic nodule.   There is additional soft tissue nodule at the junction of the pleural and medial costal margin measuring 18 mm (image 77) with intense metabolic activity (SUV max = 8.6).  There is a round focus of atelectasis within the right lower lobe measuring 5.3 cm which does  not have significant metabolic activity.  Within the left lower lobe is 10  mm nodule.  This appears to have mild metabolic activity.  Abdomen/Pelvis:  No abnormal hypermetabolic activity within the liver, pancreas, adrenal glands, or spleen.  No hypermetabolic lymph nodes in the abdomen or pelvis.  Skeleton:  No focal hypermetabolic activity to suggest skeletal metastasis.  IMPRESSION:  1.  Hypermetabolic right suprahilar mass is felt to represent a combination of atelectatic lung and bronchogenic carcinoma. 2.  Rind of hypermetabolic nodularity within the right pleural space is most consistent transpleural spread of tumor. 3.  Several discrete hypermetabolic nodules within the anterior mediastinum along the pleural surface consistent with metastatic implants.  One of these at the costosternal junction maybe accessible for biopsy. 4.  Questionable mild metabolic  activity of left lower lobe pulmonary nodule is concerning but indeterminate. 5.  No evidence of metastasis outside the thorax.   Original Report Authenticated By: Genevive Bi, M.D.   Ir Fluoro Guide Cv Line Right  08/17/2012   *RADIOLOGY REPORT*  Clinical Data/Indication: Cancer  TUNNEL POWER PORT PLACEMENT WITH SUBCUTANEOUS POCKET UTILIZING ULTRASOUND & FLOUROSCOPY  Sedation: Versed 3.0 mg, Fentanyl 150 mcg.  Total Moderate Sedation Time: 25 minutes.  As antibiotic prophylaxis, Ancef  was ordered pre-procedure and administered intravenously within one hour of incision.  Fluoroscopy Time: 1 minute and 48 seconds.  Procedure:  After written informed consent was obtained, patient was placed in the supine position on angiographic table. The left neck and chest was prepped and draped in a sterile fashion. Lidocaine was utilized for local anesthesia.  The left internal jugular vein was noted to be patent initially with ultrasound. Under sonographic guidance, a micropuncture Hurley was inserted into the left IJ vein (Ultrasound and fluoroscopic image documentation was performed). The Hurley was removed over an 018 wire which was exchanged for a Amplatz.  This was advanced into the IVC.  An 8-French dilator was advanced over the Amplatz.  A small incision was made in the left upper chest over the anterior right second rib.  Utilizing blunt dissection, a subcutaneous pocket was created in the caudal direction. The pocket was irrigated with a copious amount of sterile normal saline.  The port catheter was tunneled from the chest incision, and out the neck incision.  The reservoir was inserted into the subcutaneous pocket and secured with two 3-0 Ethilon stitches.  A peel-away sheath was advanced over the Amplatz wire.  The port catheter was cut to measure length and inserted through the peel-away sheath.  The peel- away sheath was removed.  The chest incision was closed with 3-0 Vicryl interrupted stitches for the  subcutaneous tissue and a running of 4-0 Vicryl subcuticular stitch for the skin.  The neck incision was closed with a 4-0 Vicryl subcuticular stitch.  Derma-  bond was applied to both surgical incisions.  The port reservoir was flushed and instilled with heparinized saline.  No complications.  Findings:  A left IJ vein Port-A-Cath is in place with its tip at the cavoatrial junction.  IMPRESSION: Successful 8 French left internal jugular vein power port placement with its tip at the SVC/RA junction.   Original Report Authenticated By: Jolaine Click, M.D.   Ir US Guide Vasc Access Right  08/17/2012   *RADIOLOGY REPORT*  Clinical Data/Indication: Cancer  TUNNEL POWER PORT PLACEMENT WITH SUBCUTANEOUS POCKET UTILIZING ULTRASOUND & FLOUROSCOPY  Sedation: Versed 3.0 mg, Fentanyl 150 mcg.  Total Moderate Sedation Time: 25 minutes.  As antibiotic prophylaxis, Ancef  was ordered pre-procedure and administered intravenously within one hour of incision.  Fluoroscopy Time: 1 minute and 48 seconds.  Procedure:  After written informed consent was obtained, patient was placed in the supine position on angiographic table. The left neck and chest was prepped and draped in a sterile fashion. Lidocaine was utilized for local anesthesia.  The left internal jugular vein was noted to be patent initially with ultrasound. Under sonographic guidance, a micropuncture Hurley was inserted into the left IJ vein (Ultrasound and fluoroscopic image documentation was performed). The Hurley was removed over an 018 wire which was exchanged for a Amplatz.  This was advanced into the IVC.  An 8-French dilator was advanced over the Amplatz.  A small incision was made in the left upper chest over the anterior right second rib.  Utilizing blunt dissection, a subcutaneous pocket was created in the caudal direction. The pocket was irrigated with a copious amount of sterile normal saline.  The port catheter was tunneled from the chest incision, and out the  neck incision.  The reservoir was inserted into the subcutaneous pocket and secured with two 3-0 Ethilon stitches.  A peel-away sheath was advanced over the Amplatz wire.  The port catheter was cut to measure length and inserted through the peel-away sheath.  The peel- away sheath was removed.  The chest incision was closed with 3-0 Vicryl interrupted stitches for the subcutaneous tissue and a running of 4-0 Vicryl subcuticular stitch for the skin.  The neck incision was closed with a 4-0 Vicryl subcuticular stitch.  Derma- bond was applied to both surgical incisions.  The port reservoir was flushed and instilled with heparinized saline.  No complications.  Findings:  A left IJ vein Port-A-Cath is in place with its tip at the cavoatrial junction.  IMPRESSION: Successful 8 French left internal jugular vein power port placement with its tip at the SVC/RA junction.   Original Report Authenticated By: Jolaine Click, M.D.   Dg Chest Port 1 View  08/10/2012   *RADIOLOGY REPORT*  Clinical Data: Status post bronchoscopy and right middle lobe biopsy  PORTABLE CHEST - 1 VIEW  Comparison: Chest radiograph 07/29/2012  Findings: A moderate-to-large right pleural effusion persists. There is no pleural effusion on the left.  No visible pneumothorax. The left lung is clear.  Single surgical clip on the left at the thoracic inlet.  IMPRESSION:  1.  Moderate to large right pleural effusion is without significant change. 2.  Negative for pneumothorax.   Original Report Authenticated By: Britta Mccreedy, M.D.   US Thoracentesis Asp Pleural Space W/img Guide  08/24/2012   *RADIOLOGY REPORT*  Clinical Data:  Lung cancer, right-sided pleural effusion.  ULTRASOUND GUIDED right THORACENTESIS  Comparison:  Previous thoracentesis  An ultrasound guided thoracentesis was thoroughly discussed with the patient and questions answered.  The benefits, risks, alternatives and complications were also discussed.  The patient understands and wishes to  proceed with the procedure.  Written consent was obtained.  Ultrasound was performed to localize and mark an adequate pocket of fluid in the right chest.  The area was then prepped and draped in the normal sterile fashion.  1% Lidocaine was used for local anesthesia.  Under ultrasound guidance a 19 gauge Yueh catheter was introduced.  Thoracentesis was performed.  The catheter was removed and a dressing applied.  Complications:  The patient experienced significant pressure and discomfort in the lower right chest and asked that the procedure be stopped  Findings: A total of approximately 750 ml of clear yellow fluid was removed. A fluid sample was not sent for laboratory analysis.  IMPRESSION: Successful ultrasound guided right thoracentesis yielding 750 ml of pleural fluid.  Read by Brayton El PA-C   Original Report Authenticated By: Malachy Moan, M.D.   US Thoracentesis Asp Pleural Space W/img Guide  07/29/2012   *RADIOLOGY REPORT*  Clinical Data:  History of oropharyngeal cancer.  Recent shortness of breath.  Right-sided pleural effusion found on recent CT scan. Request for diagnostic and therapeutic thoracentesis.  ULTRASOUND GUIDED right THORACENTESIS  Comparison:  None  An ultrasound guided thoracentesis was thoroughly discussed with the patient and questions answered.  The benefits, risks, alternatives and complications were also discussed.  The patient understands and wishes to proceed with the procedure.  Written consent was obtained.  Ultrasound was performed to localize and mark an adequate pocket of fluid in the right chest.  The area was then prepped and draped in the normal sterile fashion.  1% Lidocaine was used for local anesthesia.  Under ultrasound guidance a 19 gauge Yueh catheter was introduced.  The pleura was noted to be thickened and tough upon entry. Thoracentesis was performed.  The catheter was removed and a dressing applied.  Complications:  The patient experienced right-sided  chest discomfort and pressure and requested the procedure be stopped.  Findings: A total of approximately 620 ml of hazy yellow fluid was removed. A fluid sample was sent for laboratory analysis.  IMPRESSION: Successful ultrasound guided right thoracentesis yielding 620 ml of pleural fluid.  Read by Brayton El PA-C   Original Report Authenticated By: Judie Petit. Miles Costain, M.D.   Dg C-arm Bronchoscopy  08/10/2012   CLINICAL DATA: cough   C-ARM BRONCHOSCOPY  Fluoroscopy was utilized by the requesting physician.  No radiographic  interpretation.     ASSESSMENT AND PLAN: This is a very pleasant 78 years old white male with extensive stage small cell lung cancer currently undergoing systemic chemotherapy with carboplatin and etoposide status post 1 cycle. The patient related the first week of his treatment fairly well with no significant adverse effects. I recommended for him to continue his treatment as scheduled in 2 weeks. For the shortness of breath will continue to monitor his breathing closely and the patient may benefit from Pleurx catheter placement if he continues to have the recommendation of the right pleural effusion. He was advised to take neutropenic precautions and the next few days because of the low neutrophil count. He will continue to have weekly lab for evaluation and monitoring of his CBC and comprehensive metabolic panel. She was advised to call immediately if he has any concerning symptoms in the interval. The patient would come back for followup visit in 2 weeks with the start of cycle #2. The patient voices understanding of current disease status and treatment options  and is in agreement with the current care plan.  All questions were answered. The patient knows to call the clinic with any problems, questions or concerns. We can certainly see the patient much sooner if necessary.  I spent 20 minutes counseling the patient face to face. The total time spent in the appointment was 30  minutes.

## 2012-08-25 NOTE — Patient Instructions (Signed)
CHEMOTHERAPY INTENT: Palliative  CURRENT # OF CHEMOTHERAPY CYCLES: 0  CURRENT ANTIEMETICS: Zofran, dexamethasone and Compazine  CURRENT SMOKING STATUS: Never smoker  ORAL CHEMOTHERAPY AND CONSENT: None  CURRENT BISPHOSPHONATES USE: None  PAIN MANAGEMENT: 2/10, Tyleno#3  NARCOTICS INDUCED CONSTIPATION: None  LIVING WILL AND CODE STATUS: Full code.

## 2012-08-25 NOTE — Telephone Encounter (Signed)
gv and printed appt sched and avs for pt...emailed MB to add tx.   °

## 2012-08-26 ENCOUNTER — Ambulatory Visit (HOSPITAL_COMMUNITY): Admission: RE | Admit: 2012-08-26 | Payer: Medicare Other | Source: Ambulatory Visit

## 2012-08-30 ENCOUNTER — Telehealth: Payer: Self-pay | Admitting: Medical Oncology

## 2012-08-30 DIAGNOSIS — K1379 Other lesions of oral mucosa: Secondary | ICD-10-CM

## 2012-08-30 MED ORDER — MAGIC MOUTHWASH: 5.0000 mL | Freq: Four times a day (QID) | ORAL | Status: DC | PRN

## 2012-08-30 NOTE — Telephone Encounter (Signed)
Wife reports that Samuel Hurley is having more pain and asking if he can take 2 tylenol #3 at a time. She also wants to know if he can continue Ginko Bilboa and b 12 supplements.  He took one pain pill  at 0800 and 2 hours later still had pain so he  took a tylenol and that helped.

## 2012-08-30 NOTE — Telephone Encounter (Signed)
Magic mouthwash called in for mouth sores.

## 2012-08-30 NOTE — Telephone Encounter (Signed)
I called pt wife and said per Dr Walden Field it is okay to take 2 tylenol # 3 and to stop ginko bilboa , but may continue vit b 12.

## 2012-08-31 ENCOUNTER — Telehealth: Payer: Self-pay | Admitting: *Deleted

## 2012-08-31 NOTE — Telephone Encounter (Signed)
Per staff message and POF I have scheduled appts.  JMW  

## 2012-09-01 ENCOUNTER — Other Ambulatory Visit (HOSPITAL_BASED_OUTPATIENT_CLINIC_OR_DEPARTMENT_OTHER): Payer: Medicare Other

## 2012-09-01 DIAGNOSIS — C3490 Malignant neoplasm of unspecified part of unspecified bronchus or lung: Secondary | ICD-10-CM

## 2012-09-01 DIAGNOSIS — C3491 Malignant neoplasm of unspecified part of right bronchus or lung: Secondary | ICD-10-CM

## 2012-09-01 LAB — CBC WITH DIFFERENTIAL/PLATELET
Basophils Absolute: 0 10*3/uL (ref 0.0–0.1)
Eosinophils Absolute: 0 10*3/uL (ref 0.0–0.5)
HCT: 26.5 % — ABNORMAL LOW (ref 38.4–49.9)
LYMPH%: 6.2 % — ABNORMAL LOW (ref 14.0–49.0)
MCV: 88.8 fL (ref 79.3–98.0)
MONO#: 1.6 10*3/uL — ABNORMAL HIGH (ref 0.1–0.9)
MONO%: 12 % (ref 0.0–14.0)
NEUT#: 10.6 10*3/uL — ABNORMAL HIGH (ref 1.5–6.5)
NEUT%: 81.2 % — ABNORMAL HIGH (ref 39.0–75.0)
Platelets: 65 10*3/uL — ABNORMAL LOW (ref 140–400)
RBC: 2.99 10*6/uL — ABNORMAL LOW (ref 4.20–5.82)
WBC: 13.1 10*3/uL — ABNORMAL HIGH (ref 4.0–10.3)

## 2012-09-01 LAB — COMPREHENSIVE METABOLIC PANEL (CC13)
Alkaline Phosphatase: 176 U/L — ABNORMAL HIGH (ref 40–150)
BUN: 13.1 mg/dL (ref 7.0–26.0)
CO2: 26 mEq/L (ref 22–29)
Creatinine: 1.1 mg/dL (ref 0.7–1.3)
Glucose: 120 mg/dl (ref 70–140)
Sodium: 141 mEq/L (ref 136–145)
Total Bilirubin: <0.2 mg/dL (ref 0.20–1.20)
Total Protein: 6.7 g/dL (ref 6.4–8.3)

## 2012-09-08 ENCOUNTER — Ambulatory Visit (HOSPITAL_BASED_OUTPATIENT_CLINIC_OR_DEPARTMENT_OTHER): Payer: Medicare Other | Admitting: Internal Medicine

## 2012-09-08 ENCOUNTER — Other Ambulatory Visit: Payer: Medicare Other | Admitting: Lab

## 2012-09-08 ENCOUNTER — Ambulatory Visit (HOSPITAL_BASED_OUTPATIENT_CLINIC_OR_DEPARTMENT_OTHER): Payer: Medicare Other

## 2012-09-08 ENCOUNTER — Telehealth: Payer: Self-pay | Admitting: Internal Medicine

## 2012-09-08 ENCOUNTER — Ambulatory Visit: Payer: Medicare Other

## 2012-09-08 ENCOUNTER — Other Ambulatory Visit (HOSPITAL_BASED_OUTPATIENT_CLINIC_OR_DEPARTMENT_OTHER): Payer: Medicare Other | Admitting: Lab

## 2012-09-08 ENCOUNTER — Encounter: Payer: Self-pay | Admitting: Internal Medicine

## 2012-09-08 VITALS — BP 127/62 | HR 86 | Temp 98.7°F | Resp 18 | Ht 72.0 in | Wt 205.6 lb

## 2012-09-08 DIAGNOSIS — C342 Malignant neoplasm of middle lobe, bronchus or lung: Secondary | ICD-10-CM

## 2012-09-08 DIAGNOSIS — C3491 Malignant neoplasm of unspecified part of right bronchus or lung: Secondary | ICD-10-CM

## 2012-09-08 DIAGNOSIS — Z5111 Encounter for antineoplastic chemotherapy: Secondary | ICD-10-CM

## 2012-09-08 DIAGNOSIS — R52 Pain, unspecified: Secondary | ICD-10-CM

## 2012-09-08 LAB — COMPREHENSIVE METABOLIC PANEL (CC13)
ALT: 13 U/L (ref 0–55)
CO2: 24 mEq/L (ref 22–29)
Calcium: 8.7 mg/dL (ref 8.4–10.4)
Chloride: 105 mEq/L (ref 98–109)
Creatinine: 1.1 mg/dL (ref 0.7–1.3)
Glucose: 128 mg/dl (ref 70–140)
Total Protein: 6.9 g/dL (ref 6.4–8.3)

## 2012-09-08 LAB — CBC WITH DIFFERENTIAL/PLATELET
Basophils Absolute: 0 10*3/uL (ref 0.0–0.1)
Eosinophils Absolute: 0 10*3/uL (ref 0.0–0.5)
HCT: 27.1 % — ABNORMAL LOW (ref 38.4–49.9)
HGB: 8.6 g/dL — ABNORMAL LOW (ref 13.0–17.1)
LYMPH%: 6.9 % — ABNORMAL LOW (ref 14.0–49.0)
MCV: 90 fL (ref 79.3–98.0)
MONO#: 1.8 10*3/uL — ABNORMAL HIGH (ref 0.1–0.9)
MONO%: 13.4 % (ref 0.0–14.0)
NEUT#: 10.7 10*3/uL — ABNORMAL HIGH (ref 1.5–6.5)
Platelets: 428 10*3/uL — ABNORMAL HIGH (ref 140–400)
RBC: 3.01 10*6/uL — ABNORMAL LOW (ref 4.20–5.82)
WBC: 13.4 10*3/uL — ABNORMAL HIGH (ref 4.0–10.3)
nRBC: 0 % (ref 0–0)

## 2012-09-08 LAB — FUNGUS CULTURE W SMEAR

## 2012-09-08 MED ORDER — HEPARIN SOD (PORK) LOCK FLUSH 100 UNIT/ML IV SOLN
500.0000 [IU] | Freq: Once | INTRAVENOUS | Status: AC | PRN
  Administered 2012-09-08: 500 [IU]
  Filled 2012-09-08: qty 5

## 2012-09-08 MED ORDER — SODIUM CHLORIDE 0.9 % IV SOLN
496.5000 mg | Freq: Once | INTRAVENOUS | Status: AC
  Administered 2012-09-08: 500 mg via INTRAVENOUS
  Filled 2012-09-08: qty 50

## 2012-09-08 MED ORDER — SODIUM CHLORIDE 0.9 % IV SOLN
Freq: Once | INTRAVENOUS | Status: AC
  Administered 2012-09-08: 15:00:00 via INTRAVENOUS

## 2012-09-08 MED ORDER — ONDANSETRON 16 MG/50ML IVPB (CHCC)
16.0000 mg | Freq: Once | INTRAVENOUS | Status: AC
  Administered 2012-09-08: 16 mg via INTRAVENOUS

## 2012-09-08 MED ORDER — SODIUM CHLORIDE 0.9 % IJ SOLN
10.0000 mL | INTRAMUSCULAR | Status: DC | PRN
  Administered 2012-09-08: 10 mL
  Filled 2012-09-08: qty 10

## 2012-09-08 MED ORDER — DEXAMETHASONE SODIUM PHOSPHATE 20 MG/5ML IJ SOLN
20.0000 mg | Freq: Once | INTRAMUSCULAR | Status: AC
  Administered 2012-09-08: 20 mg via INTRAVENOUS

## 2012-09-08 MED ORDER — SODIUM CHLORIDE 0.9 % IV SOLN
120.0000 mg/m2 | Freq: Once | INTRAVENOUS | Status: AC
  Administered 2012-09-08: 260 mg via INTRAVENOUS
  Filled 2012-09-08: qty 13

## 2012-09-08 MED ORDER — OXYCODONE-ACETAMINOPHEN 5-325 MG PO TABS: 1.0000 | ORAL_TABLET | ORAL | Status: DC | PRN

## 2012-09-08 NOTE — Patient Instructions (Addendum)
[facility] Discharge Instructions for Patients Receiving Chemotherapy  Today you received the following chemotherapy agents : Carboplatin & Etoposide  To help prevent nausea and vomiting after your treatment, we encourage you to take your nausea medication : Compazine 10 mg every 6 hours as needed   If you develop nausea and vomiting that is not controlled by your nausea medication, call the clinic.   BELOW ARE SYMPTOMS THAT SHOULD BE REPORTED IMMEDIATELY:  *FEVER GREATER THAN 100.5 F  *CHILLS WITH OR WITHOUT FEVER  NAUSEA AND VOMITING THAT IS NOT CONTROLLED WITH YOUR NAUSEA MEDICATION  *UNUSUAL SHORTNESS OF BREATH  *UNUSUAL BRUISING OR BLEEDING  TENDERNESS IN MOUTH AND THROAT WITH OR WITHOUT PRESENCE OF ULCERS  *URINARY PROBLEMS  *BOWEL PROBLEMS  UNUSUAL RASH Items with * indicate a potential emergency and should be followed up as soon as possible.  Feel free to call the clinic you have any questions or concerns. The clinic phone number is [phone].  It has been a pleasure serving you today.

## 2012-09-08 NOTE — Patient Instructions (Signed)
CURRENT THERAPY: Systemic chemotherapy with carboplatin for AUC of 5 on day 1 and etoposide 120 mg/M2 on days 1, 2 and 3 with Neulasta support on day 4, status post one cycle. First dose was given on 08/18/2012.  CHEMOTHERAPY INTENT: Palliative  CURRENT # OF CHEMOTHERAPY CYCLES: 1  CURRENT ANTIEMETICS: Zofran, dexamethasone and Compazine  CURRENT SMOKING STATUS: Never smoker  ORAL CHEMOTHERAPY AND CONSENT: None  CURRENT BISPHOSPHONATES USE: None  PAIN MANAGEMENT: 2/10, Tyleno#3 is changed to Percocet  NARCOTICS INDUCED CONSTIPATION: None  LIVING WILL AND CODE STATUS: Full code.

## 2012-09-08 NOTE — Progress Notes (Signed)
[facility] [phone]   [phone]  OFFICE PROGRESS NOTE  Illene Regulus, MD [address] [place] [address]  DIAGNOSIS AND STAGE: Extensive stage small cell lung cancer diagnosed in July 2014.   PRIOR THERAPY: None   CURRENT THERAPY: Systemic chemotherapy with carboplatin for AUC of 5 on day 1 and etoposide 120 mg/M2 on days 1, 2 and 3 with Neulasta support on day 4, status post one cycle. First dose was given on 08/18/2012.   CHEMOTHERAPY INTENT: Palliative  CURRENT # OF CHEMOTHERAPY CYCLES: 1  CURRENT ANTIEMETICS: Zofran, dexamethasone and Compazine  CURRENT SMOKING STATUS: Never smoker  ORAL CHEMOTHERAPY AND CONSENT: None  CURRENT BISPHOSPHONATES USE: None  PAIN MANAGEMENT: 2/10, Tyleno#3 is changed to Percocet NARCOTICS INDUCED CONSTIPATION: None  LIVING WILL AND CODE STATUS: Full code.   INTERVAL HISTORY: Samuel Hurley 78 y.o. male returns to the clinic today for followup visit accompanied by his wife [name]. The patient related the first cycle of his treatment fairly well except for fatigue. He continues to complain of pain over his body but not on the right side of the chest as well as shortness of breath with exertion. He also mentions that his urine output is usually less the week after the chemotherapy but it improved after that. He also lost few pounds since his last visit but has a good appetite. He mentioned that his breathing is much better after completing the first cycle of his chemotherapy. He denied having any fever or chills. He has no nausea or vomiting. He is here today to start cycle #2 of chemotherapy  MEDICAL HISTORY: Past Medical History  Diagnosis Date  . Hyperlipidemia   . OSA (obstructive sleep apnea)   . BPH (benign prostatic hypertrophy)   . Kidney disease     TCC  . Xerostomia   . Pharynx cancer     squamous cell stage 4,s/p XRT,chemo, neck dissection  . Colon polyp     HYPERPLASTIC &  TUBULAR ADENOMA(Colonoscopy-Dr.Clay)  . Diverticulosis of colon (without mention of hemorrhage)     (Colonoscopy-Dr.[place])  . Internal hemorrhoids without mention of complication     (Colonoscopy-Dr.Luxora)  . GERD (gastroesophageal reflux disease)     (EGD-Dr. Corinda Gubler)  . Atrophic gastritis without mention of hemorrhage     (EGD-Dr. Corinda Gubler)    ALLERGIES:  is allergic to erythromycin and tetracycline.  MEDICATIONS:  Current Outpatient Prescriptions  Medication Sig Dispense Refill  . acetaminophen-codeine (TYLENOL #3) 300-30 MG per tablet Take 1 tablet by mouth every 6 (six) hours as needed for pain.  30 tablet  0  . Alum & Mag Hydroxide-Simeth (MAGIC MOUTHWASH) SOLN Take 5 mLs by mouth 4 (four) times daily as needed.  240 mL  0  . benzonatate (TESSALON) 200 MG capsule Take 200 mg by mouth 3 (three) times daily as needed for cough.      . chlorhexidine (PERIDEX) 0.12 % solution Use as directed 15 mLs in the mouth or throat 2 (two) times daily. 1/2 oz rinse and spit.      . cyanocobalamin 2000 MCG tablet Take 2,000 mcg by mouth daily.        Marland Kitchen dexlansoprazole (DEXILANT) 60 MG capsule Take 1 capsule (60 mg total) by mouth daily.  5 capsule  0  . dextromethorphan-guaiFENesin (MUCINEX DM) 30-600 MG per 12 hr tablet Take 1 tablet by mouth every 12 (twelve) hours.      . finasteride (PROSCAR) 5  MG tablet Take 5 mg by mouth daily.        Marland Kitchen levothyroxine (SYNTHROID, LEVOTHROID) 75 MCG tablet Take 75 mcg by mouth daily.        Marland Kitchen lidocaine-prilocaine (EMLA) cream Apply topically as needed.  30 g  0  . LORazepam (ATIVAN) 0.5 MG tablet Take 1 tablet (0.5 mg total) by mouth at bedtime as needed (insomnia).  30 tablet  0  . LORazepam (ATIVAN) 1 MG tablet 1 tablet by mouth once for MRI  2 tablet  0  . prochlorperazine (COMPAZINE) 10 MG tablet Take 1 tablet (10 mg total) by mouth every 6 (six) hours as needed.  60 tablet  0  . terazosin (HYTRIN) 1 MG capsule Take 1 mg by mouth 2 (two) times daily.         Current Facility-Administered Medications  Medication Dose Route Frequency Provider Last Rate Last Dose  . TDaP (BOOSTRIX) injection 0.5 mL  0.5 mL Intramuscular Once Jacques Navy, MD        SURGICAL HISTORY:  Past Surgical History  Procedure Laterality Date  . Appendectomy    . Lumbar laminectomy    . Tonsillectomy    . Bilateral ganglionectomies    . Radical right neck dissection  2008  . Pci-rfa renal cell (tcc) cancer  2009    pt denies  . Cholecystectomy  dec. 2009  . Septoplasty  03/01/12    with double turbinectomy  . Video bronchoscopy Bilateral 08/10/2012    Procedure: VIDEO BRONCHOSCOPY WITH FLUORO;  Surgeon: Oretha Milch, MD;  Location: [facility] ENDOSCOPY;  Service: Cardiopulmonary;  Laterality: Bilateral;    REVIEW OF SYSTEMS:  A comprehensive review of systems was negative except for: Constitutional: positive for anorexia, fatigue and weight loss Respiratory: positive for dyspnea on exertion   PHYSICAL EXAMINATION: General appearance: alert, cooperative, fatigued and no distress Head: Normocephalic, without obvious abnormality, atraumatic Neck: no adenopathy Lymph nodes: Cervical, supraclavicular, and axillary nodes normal. Resp: clear to auscultation bilaterally Cardio: regular rate and rhythm, S1, S2 normal, no murmur, click, rub or gallop GI: soft, non-tender; bowel sounds normal; no masses,  no organomegaly Extremities: extremities normal, atraumatic, no cyanosis or edema Neurologic: Alert and oriented X 3, normal strength and tone. Normal symmetric reflexes. Normal coordination and gait  ECOG PERFORMANCE STATUS: 1 - Symptomatic but completely ambulatory  Blood pressure 127/62, pulse 86, temperature 98.7 F (37.1 C), temperature source Oral, resp. rate 18, height 6' (1.829 m), weight 205 lb 9.6 oz (93.26 kg), SpO2 98.00%.  LABORATORY DATA: Lab Results  Component Value Date   WBC 13.4* 09/08/2012   HGB 8.6* 09/08/2012   HCT 27.1* 09/08/2012   MCV 90.0  09/08/2012   PLT 428* 09/08/2012      Chemistry      Component Value Date/Time   NA 139 09/08/2012 1329   NA 138 05/13/2012 1005   K 4.2 09/08/2012 1329   K 3.7 05/13/2012 1005   CL 101 05/13/2012 1005   CO2 24 09/08/2012 1329   CO2 30 05/13/2012 1005   BUN 14.9 09/08/2012 1329   BUN 12 05/13/2012 1005   CREATININE 1.1 09/08/2012 1329   CREATININE 1.0 05/13/2012 1005      Component Value Date/Time   CALCIUM 8.7 09/08/2012 1329   CALCIUM 8.7 05/13/2012 1005   ALKPHOS 143 09/08/2012 1329   ALKPHOS 76 05/13/2012 1005   AST 11 09/08/2012 1329   AST 16 05/13/2012 1005   ALT 13 09/08/2012 1329  ALT 11 05/13/2012 1005   BILITOT <0.20 09/08/2012 1329   BILITOT 0.5 05/13/2012 1005       RADIOGRAPHIC STUDIES: Dg Chest 1 View  08/24/2012   *RADIOLOGY REPORT*  Clinical Data: Post right thoracentesis.  CHEST - 1 VIEW  Comparison: 08/24/2012  Findings: Large right pleural effusion.  Atelectasis or infiltrate in the right lower lobe.  The effusion may have decreased slightly since prior study.  No pneumothorax following thoracentesis.  Left Port-A-Cath is unchanged.  Left lung is clear.  Heart is normal size.  IMPRESSION: Slight interval decrease in the size of the large right pleural effusion.  No pneumothorax.   Original Report Authenticated By: Charlett Nose, M.D.   Dg Chest 2 View  08/24/2012   *RADIOLOGY REPORT*  Clinical Data: Metastatic small cell carcinoma of the lung. Shortness of breath.  Follow up right pleural effusion.  CHEST - 2 VIEW  Comparison: Portable chest x-ray 08/10/2012.  PET CT 08/06/2012.  Findings: Large right pleural effusion, unchanged since the prior examinations.  Associated dense consolidation in the right lower lobe and right middle lobe.  Lungs otherwise clear with no new pulmonary parenchymal abnormalities.  Cardiac silhouette normal in size, unchanged.  Left jugular Port-A-Cath tip in the mid SVC. Mild degenerative changes involving the thoracic spine.  IMPRESSION: Large right pleural  effusion and associated dense passive atelectasis in the right middle lobe and right lower lobe, stable since the PET CT 08/06/2012.  No new abnormalities.   Original Report Authenticated By: Hulan Saas, M.D.   Ct Head W Wo Contrast  08/17/2012   *RADIOLOGY REPORT*  Clinical Data: Small cell lung cancer.  Renal cell  cancer.  Tongue cancer.  Headache.  CT HEAD WITHOUT AND WITH CONTRAST  Technique:  Contiguous axial images were obtained from the base of the skull through the vertex without and with intravenous contrast.  Contrast: OMNIPAQUE IOHEXOL 300 MG/ML  SOLN  Comparison: None.  Findings: Ventricle size is normal.  Negative for intracranial hemorrhage.  Negative for acute infarct or mass.  No significant chronic ischemia.  Postcontrast imaging reveals normal enhancement.  Negative for metastatic disease to the brain.  Small air-fluid level left maxillary sinus and right sphenoid sinus.  Mucosal edema in the ethmoid sinuses.  No skull lesion identified.  IMPRESSION: Negative for metastatic disease.  Sinusitis.   Original Report Authenticated By: Janeece Riggers, M.D.   Ir Fluoro Guide Cv Line Right  08/17/2012   *RADIOLOGY REPORT*  Clinical Data/Indication: Cancer  TUNNEL POWER PORT PLACEMENT WITH SUBCUTANEOUS POCKET UTILIZING ULTRASOUND & FLOUROSCOPY  Sedation: Versed 3.0 mg, Fentanyl 150 mcg.  Total Moderate Sedation Time: 25 minutes.  As antibiotic prophylaxis, Ancef  was ordered pre-procedure and administered intravenously within one hour of incision.  Fluoroscopy Time: 1 minute and 48 seconds.  Procedure:  After written informed consent was obtained, patient was placed in the supine position on angiographic table. The left neck and chest was prepped and draped in a sterile fashion. Lidocaine was utilized for local anesthesia.  The left internal jugular vein was noted to be patent initially with ultrasound. Under sonographic guidance, a micropuncture needle was inserted into the left IJ vein  (Ultrasound and fluoroscopic image documentation was performed). The needle was removed over an 018 wire which was exchanged for a Amplatz.  This was advanced into the IVC.  An 8-French dilator was advanced over the Amplatz.  A small incision was made in the left upper chest over the anterior right second rib.  Utilizing blunt dissection, a subcutaneous pocket was created in the caudal direction. The pocket was irrigated with a copious amount of sterile normal saline.  The port catheter was tunneled from the chest incision, and out the neck incision.  The reservoir was inserted into the subcutaneous pocket and secured with two 3-0 Ethilon stitches.  A peel-away sheath was advanced over the Amplatz wire.  The port catheter was cut to measure length and inserted through the peel-away sheath.  The peel- away sheath was removed.  The chest incision was closed with 3-0 Vicryl interrupted stitches for the subcutaneous tissue and a running of 4-0 Vicryl subcuticular stitch for the skin.  The neck incision was closed with a 4-0 Vicryl subcuticular stitch.  Derma- bond was applied to both surgical incisions.  The port reservoir was flushed and instilled with heparinized saline.  No complications.  Findings:  A left IJ vein Port-A-Cath is in place with its tip at the cavoatrial junction.  IMPRESSION: Successful 8 French left internal jugular vein power port placement with its tip at the SVC/RA junction.   Original Report Authenticated By: Jolaine Click, M.D.   Ir US Guide Vasc Access Right  08/17/2012   *RADIOLOGY REPORT*  Clinical Data/Indication: Cancer  TUNNEL POWER PORT PLACEMENT WITH SUBCUTANEOUS POCKET UTILIZING ULTRASOUND & FLOUROSCOPY  Sedation: Versed 3.0 mg, Fentanyl 150 mcg.  Total Moderate Sedation Time: 25 minutes.  As antibiotic prophylaxis, Ancef  was ordered pre-procedure and administered intravenously within one hour of incision.  Fluoroscopy Time: 1 minute and 48 seconds.  Procedure:  After written informed  consent was obtained, patient was placed in the supine position on angiographic table. The left neck and chest was prepped and draped in a sterile fashion. Lidocaine was utilized for local anesthesia.  The left internal jugular vein was noted to be patent initially with ultrasound. Under sonographic guidance, a micropuncture needle was inserted into the left IJ vein (Ultrasound and fluoroscopic image documentation was performed). The needle was removed over an 018 wire which was exchanged for a Amplatz.  This was advanced into the IVC.  An 8-French dilator was advanced over the Amplatz.  A small incision was made in the left upper chest over the anterior right second rib.  Utilizing blunt dissection, a subcutaneous pocket was created in the caudal direction. The pocket was irrigated with a copious amount of sterile normal saline.  The port catheter was tunneled from the chest incision, and out the neck incision.  The reservoir was inserted into the subcutaneous pocket and secured with two 3-0 Ethilon stitches.  A peel-away sheath was advanced over the Amplatz wire.  The port catheter was cut to measure length and inserted through the peel-away sheath.  The peel- away sheath was removed.  The chest incision was closed with 3-0 Vicryl interrupted stitches for the subcutaneous tissue and a running of 4-0 Vicryl subcuticular stitch for the skin.  The neck incision was closed with a 4-0 Vicryl subcuticular stitch.  Derma- bond was applied to both surgical incisions.  The port reservoir was flushed and instilled with heparinized saline.  No complications.  Findings:  A left IJ vein Port-A-Cath is in place with its tip at the cavoatrial junction.  IMPRESSION: Successful 8 French left internal jugular vein power port placement with its tip at the SVC/RA junction.   Original Report Authenticated By: Jolaine Click, M.D.   Dg Chest Port 1 View  08/10/2012   *RADIOLOGY REPORT*  Clinical Data: Status post bronchoscopy and right  middle lobe biopsy  PORTABLE CHEST - 1 VIEW  Comparison: Chest radiograph 07/29/2012  Findings: A moderate-to-large right pleural effusion persists. There is no pleural effusion on the left.  No visible pneumothorax. The left lung is clear.  Single surgical clip on the left at the thoracic inlet.  IMPRESSION:  1.  Moderate to large right pleural effusion is without significant change. 2.  Negative for pneumothorax.   Original Report Authenticated By: Britta Mccreedy, M.D.   US Thoracentesis Asp Pleural Space W/img Guide  08/24/2012   *RADIOLOGY REPORT*  Clinical Data:  Lung cancer, right-sided pleural effusion.  ULTRASOUND GUIDED right THORACENTESIS  Comparison:  Previous thoracentesis  An ultrasound guided thoracentesis was thoroughly discussed with the patient and questions answered.  The benefits, risks, alternatives and complications were also discussed.  The patient understands and wishes to proceed with the procedure.  Written consent was obtained.  Ultrasound was performed to localize and mark an adequate pocket of fluid in the right chest.  The area was then prepped and draped in the normal sterile fashion.  1% Lidocaine was used for local anesthesia.  Under ultrasound guidance a 19 gauge Yueh catheter was introduced.  Thoracentesis was performed.  The catheter was removed and a dressing applied.  Complications:  The patient experienced significant pressure and discomfort in the lower right chest and asked that the procedure be stopped  Findings: A total of approximately 750 ml of clear yellow fluid was removed. A fluid sample was not sent for laboratory analysis.  IMPRESSION: Successful ultrasound guided right thoracentesis yielding 750 ml of pleural fluid.  Read by Brayton El PA-C   Original Report Authenticated By: Malachy Moan, M.D.   Dg C-arm Bronchoscopy  08/10/2012   CLINICAL DATA: cough   C-ARM BRONCHOSCOPY  Fluoroscopy was utilized by the requesting physician.  No radiographic   interpretation.     ASSESSMENT AND PLAN: This is a very pleasant 78 years old white male recently diagnosed with extensive stage small cell lung cancer currently undergoing systemic chemotherapy with carboplatin for AUC of 5 on day 1 and etoposide at 120 mg/M2 on days 1, 2 and 3 with Neulasta support on day 4.  The patient tolerated the first cycle of his treatment fairly well and he noticed improvement in his breathing after the first cycle. I have a lengthy discussion with the patient and his wife today about his current condition and his treatment plan. I recommended for the patient to proceed with cycle #2 today as scheduled. He would come back for followup visit in 3 weeks with repeat CT scan of the chest, abdomen and pelvis for restaging of his disease. He was advised to call immediately if he has any concerning symptoms in the interval. For pain management I changed his pain medication from Tylenol No. 3 2 Percocet 5/325 mg by mouth every 6 hours as needed. The patient voices understanding of current disease status and treatment options and is in agreement with the current care plan.  All questions were answered. The patient knows to call the clinic with any problems, questions or concerns. We can certainly see the patient much sooner if necessary.  I spent 20 minutes counseling the patient face to face. The total time spent in the appointment was 30 minutes.

## 2012-09-08 NOTE — Telephone Encounter (Signed)
Gave pt appt for lab, MD and chemo for August and September 2014 °

## 2012-09-09 ENCOUNTER — Ambulatory Visit (HOSPITAL_BASED_OUTPATIENT_CLINIC_OR_DEPARTMENT_OTHER): Payer: Medicare Other

## 2012-09-09 ENCOUNTER — Telehealth: Payer: Self-pay | Admitting: *Deleted

## 2012-09-09 ENCOUNTER — Telehealth: Payer: Self-pay | Admitting: Internal Medicine

## 2012-09-09 VITALS — BP 137/58 | HR 78 | Temp 98.5°F

## 2012-09-09 DIAGNOSIS — Z5111 Encounter for antineoplastic chemotherapy: Secondary | ICD-10-CM

## 2012-09-09 DIAGNOSIS — C3491 Malignant neoplasm of unspecified part of right bronchus or lung: Secondary | ICD-10-CM

## 2012-09-09 DIAGNOSIS — C342 Malignant neoplasm of middle lobe, bronchus or lung: Secondary | ICD-10-CM

## 2012-09-09 MED ORDER — HEPARIN SOD (PORK) LOCK FLUSH 100 UNIT/ML IV SOLN
500.0000 [IU] | Freq: Once | INTRAVENOUS | Status: AC | PRN
  Administered 2012-09-09: 500 [IU]
  Filled 2012-09-09: qty 5

## 2012-09-09 MED ORDER — SODIUM CHLORIDE 0.9 % IV SOLN
120.0000 mg/m2 | Freq: Once | INTRAVENOUS | Status: AC
  Administered 2012-09-09: 260 mg via INTRAVENOUS
  Filled 2012-09-09: qty 13

## 2012-09-09 MED ORDER — SODIUM CHLORIDE 0.9 % IJ SOLN
10.0000 mL | INTRAMUSCULAR | Status: DC | PRN
  Administered 2012-09-09: 10 mL
  Filled 2012-09-09: qty 10

## 2012-09-09 MED ORDER — ONDANSETRON 8 MG/50ML IVPB (CHCC)
8.0000 mg | Freq: Once | INTRAVENOUS | Status: AC
  Administered 2012-09-09: 8 mg via INTRAVENOUS

## 2012-09-09 MED ORDER — DEXAMETHASONE SODIUM PHOSPHATE 10 MG/ML IJ SOLN
10.0000 mg | Freq: Once | INTRAMUSCULAR | Status: AC
  Administered 2012-09-09: 10 mg via INTRAVENOUS

## 2012-09-09 MED ORDER — SODIUM CHLORIDE 0.9 % IV SOLN
Freq: Once | INTRAVENOUS | Status: AC
  Administered 2012-09-09: 15:00:00 via INTRAVENOUS

## 2012-09-09 NOTE — Telephone Encounter (Signed)
Per staff message I h ave adjusted 9/10appt

## 2012-09-09 NOTE — Patient Instructions (Signed)
[facility] Discharge Instructions for Patients Receiving Chemotherapy  Today you received the following chemotherapy agents:  etoposide  To help prevent nausea and vomiting after your treatment, we encourage you to take your nausea medication.  Take it as often as prescribed.     If you develop nausea and vomiting that is not controlled by your nausea medication, call the clinic. If it is after clinic hours your family physician or the after hours number for the clinic or go to the Emergency Department.   BELOW ARE SYMPTOMS THAT SHOULD BE REPORTED IMMEDIATELY:  *FEVER GREATER THAN 100.5 F  *CHILLS WITH OR WITHOUT FEVER  NAUSEA AND VOMITING THAT IS NOT CONTROLLED WITH YOUR NAUSEA MEDICATION  *UNUSUAL SHORTNESS OF BREATH  *UNUSUAL BRUISING OR BLEEDING  TENDERNESS IN MOUTH AND THROAT WITH OR WITHOUT PRESENCE OF ULCERS  *URINARY PROBLEMS  *BOWEL PROBLEMS  UNUSUAL RASH Items with * indicate a potential emergency and should be followed up as soon as possible.  Feel free to call the clinic you have any questions or concerns. The clinic phone number is [phone].   I have been informed and understand all the instructions given to me. I know to contact the clinic, my physician, or go to the Emergency Department if any problems should occur. I do not have any questions at this time, but understand that I may call the clinic during office hours   should I have any questions or need assistance in obtaining follow up care.    __________________________________________  _____________  __________ Signature of Patient or Authorized Representative            Date                   Time    __________________________________________ Nurse's Signature    

## 2012-09-09 NOTE — Telephone Encounter (Signed)
Talked to pt and gave him appt for lab and chemo for August and September 2014

## 2012-09-10 ENCOUNTER — Ambulatory Visit (HOSPITAL_BASED_OUTPATIENT_CLINIC_OR_DEPARTMENT_OTHER): Payer: Medicare Other

## 2012-09-10 ENCOUNTER — Telehealth: Payer: Self-pay | Admitting: Dietician

## 2012-09-10 VITALS — BP 137/75 | HR 69 | Temp 98.7°F | Resp 18

## 2012-09-10 DIAGNOSIS — Z5111 Encounter for antineoplastic chemotherapy: Secondary | ICD-10-CM

## 2012-09-10 DIAGNOSIS — C342 Malignant neoplasm of middle lobe, bronchus or lung: Secondary | ICD-10-CM

## 2012-09-10 DIAGNOSIS — C3491 Malignant neoplasm of unspecified part of right bronchus or lung: Secondary | ICD-10-CM

## 2012-09-10 LAB — AFB CULTURE WITH SMEAR (NOT AT ARMC): Acid Fast Smear: NONE SEEN

## 2012-09-10 MED ORDER — SODIUM CHLORIDE 0.9 % IV SOLN
120.0000 mg/m2 | Freq: Once | INTRAVENOUS | Status: AC
  Administered 2012-09-10: 260 mg via INTRAVENOUS
  Filled 2012-09-10: qty 13

## 2012-09-10 MED ORDER — ONDANSETRON 8 MG/50ML IVPB (CHCC)
8.0000 mg | Freq: Once | INTRAVENOUS | Status: AC
  Administered 2012-09-10: 8 mg via INTRAVENOUS

## 2012-09-10 MED ORDER — HEPARIN SOD (PORK) LOCK FLUSH 100 UNIT/ML IV SOLN
500.0000 [IU] | Freq: Once | INTRAVENOUS | Status: AC | PRN
  Administered 2012-09-10: 500 [IU]
  Filled 2012-09-10: qty 5

## 2012-09-10 MED ORDER — SODIUM CHLORIDE 0.9 % IJ SOLN
10.0000 mL | INTRAMUSCULAR | Status: DC | PRN
  Administered 2012-09-10: 10 mL
  Filled 2012-09-10: qty 10

## 2012-09-10 MED ORDER — SODIUM CHLORIDE 0.9 % IV SOLN
Freq: Once | INTRAVENOUS | Status: AC
  Administered 2012-09-10: 16:00:00 via INTRAVENOUS

## 2012-09-10 MED ORDER — DEXAMETHASONE SODIUM PHOSPHATE 10 MG/ML IJ SOLN
10.0000 mg | Freq: Once | INTRAMUSCULAR | Status: AC
  Administered 2012-09-10: 10 mg via INTRAVENOUS

## 2012-09-10 NOTE — Patient Instructions (Addendum)
[facility] Discharge Instructions for Patients Receiving Chemotherapy  Today you received the following chemotherapy agents :  Etoposide.  To help prevent nausea and vomiting after your treatment, we encourage you to take your nausea medication as instructed by your physician.  Take Compazine 10 mg by mouth every 6 hrs as needed for nausea.  DO NOT Drive after taking Compazine as it can cause drowsiness.  Take Magic Mouthwash four times daily as needed for mouth sores.  Drink lots of water as tolerated to help with hydration.   If you develop nausea and vomiting that is not controlled by your nausea medication, call the clinic.   BELOW ARE SYMPTOMS THAT SHOULD BE REPORTED IMMEDIATELY:  *FEVER GREATER THAN 100.5 F  *CHILLS WITH OR WITHOUT FEVER  NAUSEA AND VOMITING THAT IS NOT CONTROLLED WITH YOUR NAUSEA MEDICATION  *UNUSUAL SHORTNESS OF BREATH  *UNUSUAL BRUISING OR BLEEDING  TENDERNESS IN MOUTH AND THROAT WITH OR WITHOUT PRESENCE OF ULCERS  *URINARY PROBLEMS  *BOWEL PROBLEMS  UNUSUAL RASH Items with * indicate a potential emergency and should be followed up as soon as possible.  Feel free to call the clinic you have any questions or concerns. The clinic phone number is [phone].

## 2012-09-11 ENCOUNTER — Ambulatory Visit (HOSPITAL_BASED_OUTPATIENT_CLINIC_OR_DEPARTMENT_OTHER): Payer: Medicare Other

## 2012-09-11 VITALS — BP 149/83 | HR 66 | Temp 98.0°F

## 2012-09-11 DIAGNOSIS — C342 Malignant neoplasm of middle lobe, bronchus or lung: Secondary | ICD-10-CM

## 2012-09-11 DIAGNOSIS — Z5189 Encounter for other specified aftercare: Secondary | ICD-10-CM

## 2012-09-11 DIAGNOSIS — C3491 Malignant neoplasm of unspecified part of right bronchus or lung: Secondary | ICD-10-CM

## 2012-09-11 MED ORDER — PEGFILGRASTIM INJECTION 6 MG/0.6ML
6.0000 mg | Freq: Once | SUBCUTANEOUS | Status: AC
  Administered 2012-09-11: 6 mg via SUBCUTANEOUS

## 2012-09-15 ENCOUNTER — Other Ambulatory Visit (HOSPITAL_BASED_OUTPATIENT_CLINIC_OR_DEPARTMENT_OTHER): Payer: Medicare Other

## 2012-09-15 DIAGNOSIS — C342 Malignant neoplasm of middle lobe, bronchus or lung: Secondary | ICD-10-CM

## 2012-09-15 DIAGNOSIS — C3491 Malignant neoplasm of unspecified part of right bronchus or lung: Secondary | ICD-10-CM

## 2012-09-15 LAB — COMPREHENSIVE METABOLIC PANEL (CC13)
ALT: 14 U/L (ref 0–55)
AST: 14 U/L (ref 5–34)
Albumin: 2.6 g/dL — ABNORMAL LOW (ref 3.5–5.0)
Alkaline Phosphatase: 131 U/L (ref 40–150)
Glucose: 106 mg/dl (ref 70–140)
Potassium: 4 mEq/L (ref 3.5–5.1)
Sodium: 140 mEq/L (ref 136–145)
Total Bilirubin: 0.3 mg/dL (ref 0.20–1.20)
Total Protein: 6.6 g/dL (ref 6.4–8.3)

## 2012-09-15 LAB — CBC WITH DIFFERENTIAL/PLATELET
BASO%: 1.1 % (ref 0.0–2.0)
EOS%: 0.2 % (ref 0.0–7.0)
Eosinophils Absolute: 0 10*3/uL (ref 0.0–0.5)
LYMPH%: 7.7 % — ABNORMAL LOW (ref 14.0–49.0)
MCHC: 33.9 g/dL (ref 32.0–36.0)
MCV: 87.9 fL (ref 79.3–98.0)
MONO%: 1.6 % (ref 0.0–14.0)
NEUT#: 4.1 10*3/uL (ref 1.5–6.5)
Platelets: 423 10*3/uL — ABNORMAL HIGH (ref 140–400)
RBC: 2.72 10*6/uL — ABNORMAL LOW (ref 4.20–5.82)
RDW: 15.9 % — ABNORMAL HIGH (ref 11.0–14.6)

## 2012-09-21 ENCOUNTER — Telehealth: Payer: Self-pay | Admitting: Medical Oncology

## 2012-09-21 ENCOUNTER — Telehealth: Payer: Self-pay | Admitting: *Deleted

## 2012-09-21 NOTE — Telephone Encounter (Signed)
"  Swelling in both feet can barely get his shoe on and has a low urine output". Reports his breathing is okay. No cough. Pain pills don't hold long enough. Samuel Hurley wants a " TSH drawn" because of his low energy level,  the edema and he is sleeping all the time. I told her to keep his lab appt tomorrow . He is eating protein. I reiterated that he keep his legs elevated.

## 2012-09-21 NOTE — Telephone Encounter (Signed)
Patient's family member called and moved his lab appt to later int eh day

## 2012-09-22 ENCOUNTER — Other Ambulatory Visit: Payer: Medicare Other

## 2012-09-22 ENCOUNTER — Ambulatory Visit: Payer: Medicare Other

## 2012-09-22 ENCOUNTER — Other Ambulatory Visit: Payer: Self-pay | Admitting: Medical Oncology

## 2012-09-22 ENCOUNTER — Telehealth: Payer: Self-pay | Admitting: Medical Oncology

## 2012-09-22 ENCOUNTER — Ambulatory Visit (HOSPITAL_COMMUNITY)
Admission: RE | Admit: 2012-09-22 | Discharge: 2012-09-22 | Disposition: A | Discharge status: Home / Self Care | Payer: Medicare Other | Source: Ambulatory Visit | Attending: Internal Medicine | Admitting: Internal Medicine

## 2012-09-22 ENCOUNTER — Other Ambulatory Visit (HOSPITAL_BASED_OUTPATIENT_CLINIC_OR_DEPARTMENT_OTHER): Payer: Medicare Other

## 2012-09-22 DIAGNOSIS — D649 Anemia, unspecified: Secondary | ICD-10-CM | POA: Insufficient documentation

## 2012-09-22 DIAGNOSIS — C3491 Malignant neoplasm of unspecified part of right bronchus or lung: Secondary | ICD-10-CM

## 2012-09-22 DIAGNOSIS — C342 Malignant neoplasm of middle lobe, bronchus or lung: Secondary | ICD-10-CM

## 2012-09-22 LAB — CBC WITH DIFFERENTIAL/PLATELET
EOS%: 0.2 % (ref 0.0–7.0)
Eosinophils Absolute: 0 10*3/uL (ref 0.0–0.5)
LYMPH%: 5.5 % — ABNORMAL LOW (ref 14.0–49.0)
MCH: 28.9 pg (ref 27.2–33.4)
MCV: 88.1 fL (ref 79.3–98.0)
MONO%: 8.2 % (ref 0.0–14.0)
NEUT#: 9.5 10*3/uL — ABNORMAL HIGH (ref 1.5–6.5)
Platelets: 75 10*3/uL — ABNORMAL LOW (ref 140–400)
RBC: 2.61 10*6/uL — ABNORMAL LOW (ref 4.20–5.82)
RDW: 16.5 % — ABNORMAL HIGH (ref 11.0–14.6)

## 2012-09-22 LAB — COMPREHENSIVE METABOLIC PANEL (CC13)
AST: 10 U/L (ref 5–34)
Alkaline Phosphatase: 118 U/L (ref 40–150)
BUN: 11.7 mg/dL (ref 7.0–26.0)
Glucose: 123 mg/dl (ref 70–140)
Sodium: 142 mEq/L (ref 136–145)
Total Bilirubin: <0.2 mg/dL (ref 0.20–1.20)
Total Protein: 6.6 g/dL (ref 6.4–8.3)

## 2012-09-22 LAB — AFB CULTURE WITH SMEAR (NOT AT ARMC)

## 2012-09-22 LAB — ABO/RH: ABO/RH(D): A POS

## 2012-09-22 NOTE — Telephone Encounter (Signed)
Labs reviewed with pt and he agrees with proceeding with transfusion per Dr Arbutus Ped.Marland Kitchen

## 2012-09-22 NOTE — Telephone Encounter (Signed)
Saw pt in lobby -reviewed labs and scheduled for blood transfusion. Trace -1+ edema in ankles.pt has his shoes on ,loosly tied. Pt scheduled for blood transfusion.

## 2012-09-22 NOTE — Progress Notes (Signed)
HAR done

## 2012-09-23 ENCOUNTER — Telehealth: Payer: Self-pay | Admitting: Internal Medicine

## 2012-09-23 ENCOUNTER — Telehealth: Payer: Self-pay | Admitting: Medical Oncology

## 2012-09-23 NOTE — Telephone Encounter (Signed)
lvm for pt regarding to all Sept appts.Marland KitchenMarland KitchenMarland Kitchen

## 2012-09-23 NOTE — Telephone Encounter (Signed)
Confirmed appt.

## 2012-09-24 ENCOUNTER — Other Ambulatory Visit: Payer: Medicare Other | Admitting: Lab

## 2012-09-24 ENCOUNTER — Ambulatory Visit (HOSPITAL_BASED_OUTPATIENT_CLINIC_OR_DEPARTMENT_OTHER): Payer: Medicare Other

## 2012-09-24 VITALS — BP 153/71 | HR 64 | Temp 98.7°F | Resp 18

## 2012-09-24 DIAGNOSIS — D649 Anemia, unspecified: Secondary | ICD-10-CM

## 2012-09-24 LAB — PREPARE RBC (CROSSMATCH)

## 2012-09-24 MED ORDER — DIPHENHYDRAMINE HCL 50 MG/ML IJ SOLN: 25.0000 mg | Freq: Once | INTRAMUSCULAR | Status: DC

## 2012-09-24 MED ORDER — DIPHENHYDRAMINE HCL 50 MG/ML IJ SOLN
INTRAMUSCULAR | Status: AC
  Administered 2012-09-24: 25 mg via INTRAVENOUS
  Filled 2012-09-24: qty 1

## 2012-09-24 MED ORDER — ACETAMINOPHEN 325 MG PO TABS
650.0000 mg | ORAL_TABLET | Freq: Once | ORAL | Status: AC
  Administered 2012-09-24: 650 mg via ORAL

## 2012-09-24 MED ORDER — ACETAMINOPHEN 325 MG PO TABS
ORAL_TABLET | ORAL | Status: AC
  Filled 2012-09-24: qty 2

## 2012-09-24 NOTE — Patient Instructions (Addendum)
Blood Transfusion  A blood transfusion replaces your blood or some of its parts. Blood is replaced when you have lost blood because of surgery, an accident, or for severe blood conditions like anemia. You can donate blood to be used on yourself if you have a planned surgery. If you lose blood during that surgery, your own blood can be given back to you. Any blood given to you is checked to make sure it matches your blood type. Your temperature, blood pressure, and heart rate (vital signs) will be checked often.  GET HELP RIGHT AWAY IF:   You feel sick to your stomach (nauseous) or throw up (vomit).  You have watery poop (diarrhea).  You have shortness of breath or trouble breathing.  You have blood in your pee (urine) or have dark colored pee.  You have chest pain or tightness.  Your eyes or skin turn yellow (jaundice).  You have a temperature by mouth above 102 F (38.9 C), not controlled by medicine.  You start to shake and have chills.  You develop a a red rash (hives) or feel itchy.  You develop lightheadedness or feel confused.  You develop back, joint, or muscle pain.  You do not feel hungry (lost appetite).  You feel tired, restless, or nervous.  You develop belly (abdominal) cramps. Document Released: 04/04/2008 Document Revised: 03/31/2011 Document Reviewed: 04/04/2008 [employer] Patient Information 2014 [employer].  

## 2012-09-25 LAB — TYPE AND SCREEN
Antibody Screen: NEGATIVE
Unit division: 0

## 2012-09-27 ENCOUNTER — Encounter (HOSPITAL_COMMUNITY): Payer: Self-pay

## 2012-09-27 ENCOUNTER — Ambulatory Visit (HOSPITAL_COMMUNITY)
Admission: RE | Admit: 2012-09-27 | Discharge: 2012-09-27 | Disposition: A | Discharge status: Home / Self Care | Payer: Medicare Other | Source: Ambulatory Visit | Attending: Internal Medicine | Admitting: Internal Medicine

## 2012-09-27 DIAGNOSIS — N400 Enlarged prostate without lower urinary tract symptoms: Secondary | ICD-10-CM | POA: Insufficient documentation

## 2012-09-27 DIAGNOSIS — K5730 Diverticulosis of large intestine without perforation or abscess without bleeding: Secondary | ICD-10-CM | POA: Insufficient documentation

## 2012-09-27 DIAGNOSIS — J90 Pleural effusion, not elsewhere classified: Secondary | ICD-10-CM | POA: Insufficient documentation

## 2012-09-27 DIAGNOSIS — C3491 Malignant neoplasm of unspecified part of right bronchus or lung: Secondary | ICD-10-CM

## 2012-09-27 DIAGNOSIS — Z85810 Personal history of malignant neoplasm of tongue: Secondary | ICD-10-CM | POA: Insufficient documentation

## 2012-09-27 DIAGNOSIS — Z85528 Personal history of other malignant neoplasm of kidney: Secondary | ICD-10-CM | POA: Insufficient documentation

## 2012-09-27 DIAGNOSIS — K7689 Other specified diseases of liver: Secondary | ICD-10-CM | POA: Insufficient documentation

## 2012-09-27 DIAGNOSIS — Z9221 Personal history of antineoplastic chemotherapy: Secondary | ICD-10-CM | POA: Insufficient documentation

## 2012-09-27 DIAGNOSIS — Z9089 Acquired absence of other organs: Secondary | ICD-10-CM | POA: Insufficient documentation

## 2012-09-27 DIAGNOSIS — Z923 Personal history of irradiation: Secondary | ICD-10-CM | POA: Insufficient documentation

## 2012-09-27 DIAGNOSIS — C3490 Malignant neoplasm of unspecified part of unspecified bronchus or lung: Secondary | ICD-10-CM | POA: Insufficient documentation

## 2012-09-27 DIAGNOSIS — K4090 Unilateral inguinal hernia, without obstruction or gangrene, not specified as recurrent: Secondary | ICD-10-CM | POA: Insufficient documentation

## 2012-09-27 DIAGNOSIS — I700 Atherosclerosis of aorta: Secondary | ICD-10-CM | POA: Insufficient documentation

## 2012-09-27 MED ORDER — IOHEXOL 300 MG/ML  SOLN
100.0000 mL | Freq: Once | INTRAMUSCULAR | Status: AC | PRN
  Administered 2012-09-27: 100 mL via INTRAVENOUS

## 2012-09-29 ENCOUNTER — Other Ambulatory Visit (HOSPITAL_BASED_OUTPATIENT_CLINIC_OR_DEPARTMENT_OTHER): Payer: Medicare Other | Admitting: Lab

## 2012-09-29 ENCOUNTER — Ambulatory Visit (HOSPITAL_BASED_OUTPATIENT_CLINIC_OR_DEPARTMENT_OTHER): Payer: Medicare Other

## 2012-09-29 ENCOUNTER — Telehealth: Payer: Self-pay | Admitting: Internal Medicine

## 2012-09-29 ENCOUNTER — Telehealth: Payer: Self-pay | Admitting: *Deleted

## 2012-09-29 ENCOUNTER — Other Ambulatory Visit (HOSPITAL_COMMUNITY): Payer: Medicare Other

## 2012-09-29 ENCOUNTER — Ambulatory Visit (HOSPITAL_BASED_OUTPATIENT_CLINIC_OR_DEPARTMENT_OTHER): Payer: Medicare Other | Admitting: Internal Medicine

## 2012-09-29 VITALS — BP 124/64 | HR 81 | Temp 97.6°F | Resp 20 | Ht 72.0 in | Wt 203.9 lb

## 2012-09-29 DIAGNOSIS — C3490 Malignant neoplasm of unspecified part of unspecified bronchus or lung: Secondary | ICD-10-CM

## 2012-09-29 DIAGNOSIS — Z5111 Encounter for antineoplastic chemotherapy: Secondary | ICD-10-CM

## 2012-09-29 DIAGNOSIS — C342 Malignant neoplasm of middle lobe, bronchus or lung: Secondary | ICD-10-CM

## 2012-09-29 DIAGNOSIS — C3491 Malignant neoplasm of unspecified part of right bronchus or lung: Secondary | ICD-10-CM

## 2012-09-29 LAB — COMPREHENSIVE METABOLIC PANEL (CC13)
ALT: 6 U/L (ref 0–55)
AST: 9 U/L (ref 5–34)
Chloride: 106 mEq/L (ref 98–109)
Creatinine: 0.9 mg/dL (ref 0.7–1.3)
Total Bilirubin: <0.2 mg/dL (ref 0.20–1.20)

## 2012-09-29 LAB — CBC WITH DIFFERENTIAL/PLATELET
BASO%: 0.3 % (ref 0.0–2.0)
EOS%: 0.2 % (ref 0.0–7.0)
HCT: 31.5 % — ABNORMAL LOW (ref 38.4–49.9)
MCH: 29.3 pg (ref 27.2–33.4)
MCHC: 33.3 g/dL (ref 32.0–36.0)
NEUT%: 79.1 % — ABNORMAL HIGH (ref 39.0–75.0)
lymph#: 0.6 10*3/uL — ABNORMAL LOW (ref 0.9–3.3)

## 2012-09-29 MED ORDER — ONDANSETRON 16 MG/50ML IVPB (CHCC)
16.0000 mg | Freq: Once | INTRAVENOUS | Status: AC
  Administered 2012-09-29: 16 mg via INTRAVENOUS

## 2012-09-29 MED ORDER — SODIUM CHLORIDE 0.9 % IV SOLN
120.0000 mg/m2 | Freq: Once | INTRAVENOUS | Status: AC
  Administered 2012-09-29: 260 mg via INTRAVENOUS
  Filled 2012-09-29: qty 13

## 2012-09-29 MED ORDER — DEXAMETHASONE SODIUM PHOSPHATE 20 MG/5ML IJ SOLN
20.0000 mg | Freq: Once | INTRAMUSCULAR | Status: AC
  Administered 2012-09-29: 20 mg via INTRAVENOUS

## 2012-09-29 MED ORDER — ONDANSETRON 16 MG/50ML IVPB (CHCC)
INTRAVENOUS | Status: AC
  Administered 2012-09-29: 16 mg via INTRAVENOUS
  Filled 2012-09-29: qty 16

## 2012-09-29 MED ORDER — DEXAMETHASONE SODIUM PHOSPHATE 20 MG/5ML IJ SOLN
INTRAMUSCULAR | Status: AC
  Administered 2012-09-29: 20 mg via INTRAVENOUS
  Filled 2012-09-29: qty 5

## 2012-09-29 MED ORDER — SODIUM CHLORIDE 0.9 % IJ SOLN
10.0000 mL | INTRAMUSCULAR | Status: DC | PRN
  Administered 2012-09-29: 10 mL
  Filled 2012-09-29: qty 10

## 2012-09-29 MED ORDER — SODIUM CHLORIDE 0.9 % IV SOLN
Freq: Once | INTRAVENOUS | Status: AC
  Administered 2012-09-29: 12:00:00 via INTRAVENOUS

## 2012-09-29 MED ORDER — HEPARIN SOD (PORK) LOCK FLUSH 100 UNIT/ML IV SOLN
500.0000 [IU] | Freq: Once | INTRAVENOUS | Status: AC | PRN
  Administered 2012-09-29: 500 [IU]
  Filled 2012-09-29: qty 5

## 2012-09-29 MED ORDER — SODIUM CHLORIDE 0.9 % IV SOLN
533.5000 mg | Freq: Once | INTRAVENOUS | Status: AC
  Administered 2012-09-29: 530 mg via INTRAVENOUS
  Filled 2012-09-29: qty 53

## 2012-09-29 NOTE — Telephone Encounter (Signed)
Talked to pt he is aware of all appts including chemo for Sept  and October

## 2012-09-29 NOTE — Progress Notes (Signed)
[facility] [phone]   [phone]  OFFICE PROGRESS NOTE  Illene Regulus, MD [address] [place] [address]  DIAGNOSIS AND STAGE: Extensive stage small cell lung cancer diagnosed in July 2014.   PRIOR THERAPY: None   CURRENT THERAPY: Systemic chemotherapy with carboplatin for AUC of 5 on day 1 and etoposide 120 mg/M2 on days 1, 2 and 3 with Neulasta support on day 4, status post 2 cycles. First dose was given on 08/18/2012.   CHEMOTHERAPY INTENT: Palliative  CURRENT # OF CHEMOTHERAPY CYCLES: 2 CURRENT ANTIEMETICS: Zofran, dexamethasone and Compazine  CURRENT SMOKING STATUS: Never smoker  ORAL CHEMOTHERAPY AND CONSENT: None  CURRENT BISPHOSPHONATES USE: None  PAIN MANAGEMENT: 2/10, Tyleno#3 is changed to Percocet  NARCOTICS INDUCED CONSTIPATION: None  LIVING WILL AND CODE STATUS: Full code.   INTERVAL HISTORY: Samuel Hurley 78 y.o. male returns to the clinic today for follow up visit accompanied by his wife [name]. The patient is complaining of increasing fatigue and weakness. It was worse after the Neulasta injection. He felt a little bit better yesterday and today. He tolerated the second cycle of his systemic chemotherapy with carboplatin and etoposide fairly well except for the fatigue and weakness as well as mild nausea. He denied having any significant fever or chills. He denied having any significant chest pain and noticed a significant improvement in the shortness of breath. He has no cough or hemoptysis. He has no significant weight loss or night sweats. The patient has repeat CT scan of the chest, abdomen and pelvis performed recently and he is here for evaluation and discussion of his scan results.  MEDICAL HISTORY: Past Medical History  Diagnosis Date  . Hyperlipidemia   . OSA (obstructive sleep apnea)   . BPH (benign prostatic hypertrophy)   . Kidney disease     TCC  . Xerostomia   . Pharynx cancer    squamous cell stage 4,s/p XRT,chemo, neck dissection  . Colon polyp     HYPERPLASTIC & TUBULAR ADENOMA(Colonoscopy-Dr.K[place]omo)  . Diverticulosis of colon (without mention of hemorrhage)     (Colonoscopy-Dr.[place])  . Internal hemorrhoids without mention of complication     (Colonoscopy-Dr.[place])  . GERD (gastroesophageal reflux disease)     (EGD-Dr. Corinda Gubler)  . Atrophic gastritis without mention of hemorrhage     (EGD-Dr. Corinda Gubler)    ALLERGIES:  is allergic to erythromycin and tetracycline.  MEDICATIONS:  Current Outpatient Prescriptions  Medication Sig Dispense Refill  . Alum & Mag Hydroxide-Simeth (MAGIC MOUTHWASH) SOLN Take 5 mLs by mouth 4 (four) times daily as needed.  240 mL  0  . benzonatate (TESSALON) 200 MG capsule Take 200 mg by mouth 3 (three) times daily as needed for cough.      . chlorhexidine (PERIDEX) 0.12 % solution Use as directed 15 mLs in the mouth or throat 2 (two) times daily. 1/2 oz rinse and spit.      . cyanocobalamin 2000 MCG tablet Take 2,000 mcg by mouth daily.        Marland Kitchen dexlansoprazole (DEXILANT) 60 MG capsule Take 1 capsule (60 mg total) by mouth daily.  5 capsule  0  . dextromethorphan-guaiFENesin (MUCINEX DM) 30-600 MG per 12 hr tablet Take 1 tablet by mouth every 12 (twelve) hours.      . finasteride (PROSCAR) 5 MG tablet Take 5 mg by mouth daily.        Marland Kitchen levothyroxine (SYNTHROID, LEVOTHROID) 75 MCG tablet Take 75  mcg by mouth daily.        Marland Kitchen lidocaine-prilocaine (EMLA) cream Apply topically as needed.  30 g  0  . LORazepam (ATIVAN) 0.5 MG tablet Take 1 tablet (0.5 mg total) by mouth at bedtime as needed (insomnia).  30 tablet  0  . LORazepam (ATIVAN) 1 MG tablet 1 tablet by mouth once for MRI  2 tablet  0  . oxyCODONE-acetaminophen (PERCOCET/ROXICET) 5-325 MG per tablet Take 1 tablet by mouth every 4 (four) hours as needed for pain.  60 tablet  0  . prochlorperazine (COMPAZINE) 10 MG tablet Take 1 tablet (10 mg total) by mouth every 6 (six) hours as  needed.  60 tablet  0  . terazosin (HYTRIN) 1 MG capsule Take 1 mg by mouth 2 (two) times daily.        Current Facility-Administered Medications  Medication Dose Route Frequency Provider Last Rate Last Dose  . TDaP (BOOSTRIX) injection 0.5 mL  0.5 mL Intramuscular Once Jacques Navy, MD        SURGICAL HISTORY:  Past Surgical History  Procedure Laterality Date  . Appendectomy    . Lumbar laminectomy    . Tonsillectomy    . Bilateral ganglionectomies    . Radical right neck dissection  2008  . Pci-rfa renal cell (tcc) cancer  2009    pt denies  . Cholecystectomy  dec. 2009  . Septoplasty  03/01/12    with double turbinectomy  . Video bronchoscopy Bilateral 08/10/2012    Procedure: VIDEO BRONCHOSCOPY WITH FLUORO;  Surgeon: Oretha Milch, MD;  Location: [facility] ENDOSCOPY;  Service: Cardiopulmonary;  Laterality: Bilateral;    REVIEW OF SYSTEMS:  Constitutional: positive for anorexia and fatigue Eyes: negative Ears, nose, mouth, throat, and face: negative Respiratory: positive for dyspnea on exertion Cardiovascular: negative Gastrointestinal: negative Genitourinary:negative Integument/breast: negative Hematologic/lymphatic: negative Musculoskeletal:negative Neurological: negative Behavioral/Psych: negative Endocrine: negative Allergic/Immunologic: negative   PHYSICAL EXAMINATION: General appearance: alert, cooperative, fatigued and no distress Head: Normocephalic, without obvious abnormality, atraumatic Neck: no adenopathy, no JVD and thyroid not enlarged, symmetric, no tenderness/mass/nodules Lymph nodes: Cervical, supraclavicular, and axillary nodes normal. Resp: diminished breath sounds RLL and dullness to percussion RLL Back: symmetric, no curvature. ROM normal. No CVA tenderness. Cardio: regular rate and rhythm, S1, S2 normal, no murmur, click, rub or gallop GI: soft, non-tender; bowel sounds normal; no masses,  no organomegaly Extremities: extremities normal, atraumatic,  no cyanosis or edema Neurologic: Alert and oriented X 3, normal strength and tone. Normal symmetric reflexes. Normal coordination and gait  ECOG PERFORMANCE STATUS: 1 - Symptomatic but completely ambulatory  Blood pressure 124/64, pulse 81, temperature 97.6 F (36.4 C), temperature source Oral, resp. rate 20, height 6' (1.829 m), weight 203 lb 14.4 oz (92.488 kg).  LABORATORY DATA: Lab Results  Component Value Date   WBC 11.2* 09/22/2012   HGB 7.5* 09/22/2012   HCT 23.0* 09/22/2012   MCV 88.1 09/22/2012   PLT 75* 09/22/2012      Chemistry      Component Value Date/Time   NA 142 09/22/2012 1401   NA 138 05/13/2012 1005   K 3.2* 09/22/2012 1401   K 3.7 05/13/2012 1005   CL 101 05/13/2012 1005   CO2 24 09/22/2012 1401   CO2 30 05/13/2012 1005   BUN 11.7 09/22/2012 1401   BUN 12 05/13/2012 1005   CREATININE 1.0 09/22/2012 1401   CREATININE 1.0 05/13/2012 1005      Component Value Date/Time   CALCIUM 8.8 09/22/2012  1401   CALCIUM 8.7 05/13/2012 1005   ALKPHOS 118 09/22/2012 1401   ALKPHOS 76 05/13/2012 1005   AST 10 09/22/2012 1401   AST 16 05/13/2012 1005   ALT 10 09/22/2012 1401   ALT 11 05/13/2012 1005   BILITOT <0.20 09/22/2012 1401   BILITOT 0.5 05/13/2012 1005       RADIOGRAPHIC STUDIES: Ct Chest W Contrast  09/27/2012   *RADIOLOGY REPORT*  Clinical Data:  Right-sided small cell lung cancer.  History of tongue cancer in 2008, chemotherapy and XRT complete.  History of left renal cancer status post RFA 2008.  Prior cholecystectomy and appendectomy.  CT CHEST, ABDOMEN AND PELVIS WITH CONTRAST  Technique:  Multidetector CT imaging of the chest, abdomen and pelvis was performed following the standard protocol during bolus administration of intravenous contrast.  Contrast: OMNIPAQUE IOHEXOL 300 MG/ML  SOLN  Comparison:  PET CT dated 08/06/2012.    CT CHEST  Findings:  Volume loss in the right hemithorax.  Moderate to large right pleural effusion with thickened pleural rind (series 2/image 43), likely  malignant, grossly unchanged. Mild pleural-based nodularity at the anterior lung base measures 9 mm (series 2/image 31).  Underlying mass-like consolidation of the right middle lobe, measuring 2.2 x 5.8 cm (series 2/image 32), hypermetabolic on prior PET and corresponding to known small cell cancer, mildly improved. Additional mass-like consolidation in the right lower lobe, measuring 2.9 x 4.5 cm (series 2/image 47), non-FDG-avid.  Mild cardiomegaly.  No pericardial effusion.  Mild atherosclerotic calcifications of the aortic arch.  Left chest port.  7 mm short axis precarinal node with preservation of the normal fatty hilum (series 2/image 24).  No suspicious hilar or axillary lymphadenopathy.  IMPRESSION: Mass-like consolidation in the right middle lobe, corresponding to known small cell cancer, mildly improved.  Additional mass-like consolidation in the right lower lobe, non-FDG- avid.  Moderate to large right pleural effusion with thickened pleural rind and nodularity, likely malignant, grossly unchanged.    CT ABDOMEN AND PELVIS  Findings:  Multiple hepatic cysts, including a bilobed 8.5 x 6.5 cm cyst in the central right liver (series 2/image 87).  Spleen, pancreas, and adrenal glands are within normal limits.  Status post cholecystectomy.  No intrahepatic or extrahepatic ductal dilatation.  Postprocedural changes in the lateral interpolar left kidney (series 2/image 72).  Right kidney is within normal limits.  No hydronephrosis.  No evidence of bowel obstruction.  Extensive colonic diverticulosis, without associated inflammatory changes.  Atherosclerotic calcifications of the abdominal aorta and branch vessels.  No abdominopelvic ascites.  No suspicious abdominopelvic lymphadenopathy.  Prostatomegaly, measuring 6.3 cm in maximal dimension, with enlargement of the central gland which indents the base of the bladder.  Bladder is underdistended.  Tiny fat-containing right inguinal hernia.  Degenerative  changes of the lumbar spine.  IMPRESSION: No evidence of metastatic disease in the abdomen/pelvis.  Postprocedural changes in the lateral interpolar left kidney.  Additional ancillary findings as above.   Original Report Authenticated By: Charline Bills, M.D.   ASSESSMENT AND PLAN:  1) extensive stage small cell lung cancer currently undergoing systemic chemotherapy with carboplatin and etoposide status post 2 cycles with partial response. I discussed the scan results and showed the images to the patient and his wife. I recommended for him to continue systemic chemotherapy with the same regimen. He will start cycle #3 today. 2) right-sided pleural effusion:  I will arrange for the patient ultrasound-guided right thoracentesis to be performed in the next few days. 3)  chemotherapy-induced anemia: I will arrange for the patient to receive 2 units of PRBCs transfusion. 4) followup visit in 3 weeks with the start of cycle #4. The patient was advised to call immediately if he has any concerning symptoms in the interval.  The patient voices understanding of current disease status and treatment options and is in agreement with the current care plan.  All questions were answered. The patient knows to call the clinic with any problems, questions or concerns. We can certainly see the patient much sooner if necessary.  I spent 20 minutes counseling the patient face to face. The total time spent in the appointment was 30 minutes.

## 2012-09-29 NOTE — Progress Notes (Signed)
Ok to treat today with chemotherapy and todays CBC per Dr Arbutus Ped.

## 2012-09-29 NOTE — Patient Instructions (Signed)
[facility] Discharge Instructions for Patients Receiving Chemotherapy  Today you received the following chemotherapy agents: carboplatin, etoposide  To help prevent nausea and vomiting after your treatment, we encourage you to take your nausea medication.  Take it as often as prescribed.     If you develop nausea and vomiting that is not controlled by your nausea medication, call the clinic. If it is after clinic hours your family physician or the after hours number for the clinic or go to the Emergency Department.   BELOW ARE SYMPTOMS THAT SHOULD BE REPORTED IMMEDIATELY:  *FEVER GREATER THAN 100.5 F  *CHILLS WITH OR WITHOUT FEVER  NAUSEA AND VOMITING THAT IS NOT CONTROLLED WITH YOUR NAUSEA MEDICATION  *UNUSUAL SHORTNESS OF BREATH  *UNUSUAL BRUISING OR BLEEDING  TENDERNESS IN MOUTH AND THROAT WITH OR WITHOUT PRESENCE OF ULCERS  *URINARY PROBLEMS  *BOWEL PROBLEMS  UNUSUAL RASH Items with * indicate a potential emergency and should be followed up as soon as possible.  Feel free to call the clinic you have any questions or concerns. The clinic phone number is [phone].   I have been informed and understand all the instructions given to me. I know to contact the clinic, my physician, or go to the Emergency Department if any problems should occur. I do not have any questions at this time, but understand that I may call the clinic during office hours   should I have any questions or need assistance in obtaining follow up care.    __________________________________________  _____________  __________ Signature of Patient or Authorized Representative            Date                   Time    __________________________________________ Nurse's Signature    

## 2012-09-29 NOTE — Telephone Encounter (Signed)
Gave pt appt for lab and Md for September and oc tober [employer] regarding chemo

## 2012-09-29 NOTE — Telephone Encounter (Signed)
Per staff message and POF I have scheduled appts.  JMW  

## 2012-09-30 ENCOUNTER — Ambulatory Visit (HOSPITAL_COMMUNITY)
Admission: RE | Admit: 2012-09-30 | Discharge: 2012-09-30 | Disposition: A | Discharge status: Home / Self Care | Payer: Medicare Other | Source: Ambulatory Visit | Attending: Internal Medicine | Admitting: Internal Medicine

## 2012-09-30 ENCOUNTER — Ambulatory Visit (HOSPITAL_BASED_OUTPATIENT_CLINIC_OR_DEPARTMENT_OTHER): Payer: Medicare Other

## 2012-09-30 ENCOUNTER — Ambulatory Visit: Payer: Medicare Other | Admitting: Internal Medicine

## 2012-09-30 ENCOUNTER — Ambulatory Visit (HOSPITAL_COMMUNITY)
Admission: RE | Admit: 2012-09-30 | Discharge: 2012-09-30 | Disposition: A | Discharge status: Home / Self Care | Payer: Medicare Other | Source: Ambulatory Visit | Attending: Radiology | Admitting: Radiology

## 2012-09-30 DIAGNOSIS — Z5111 Encounter for antineoplastic chemotherapy: Secondary | ICD-10-CM

## 2012-09-30 DIAGNOSIS — C342 Malignant neoplasm of middle lobe, bronchus or lung: Secondary | ICD-10-CM

## 2012-09-30 DIAGNOSIS — C3491 Malignant neoplasm of unspecified part of right bronchus or lung: Secondary | ICD-10-CM

## 2012-09-30 DIAGNOSIS — J90 Pleural effusion, not elsewhere classified: Secondary | ICD-10-CM | POA: Insufficient documentation

## 2012-09-30 DIAGNOSIS — C3490 Malignant neoplasm of unspecified part of unspecified bronchus or lung: Secondary | ICD-10-CM | POA: Insufficient documentation

## 2012-09-30 MED ORDER — SODIUM CHLORIDE 0.9 % IV SOLN
120.0000 mg/m2 | Freq: Once | INTRAVENOUS | Status: AC
  Administered 2012-09-30: 260 mg via INTRAVENOUS
  Filled 2012-09-30: qty 13

## 2012-09-30 MED ORDER — ONDANSETRON 8 MG/NS 50 ML IVPB
INTRAVENOUS | Status: AC
  Filled 2012-09-30: qty 8

## 2012-09-30 MED ORDER — SODIUM CHLORIDE 0.9 % IJ SOLN
10.0000 mL | INTRAMUSCULAR | Status: DC | PRN
  Administered 2012-09-30: 10 mL
  Filled 2012-09-30: qty 10

## 2012-09-30 MED ORDER — HEPARIN SOD (PORK) LOCK FLUSH 100 UNIT/ML IV SOLN
500.0000 [IU] | Freq: Once | INTRAVENOUS | Status: AC | PRN
  Administered 2012-09-30: 500 [IU]
  Filled 2012-09-30: qty 5

## 2012-09-30 MED ORDER — SODIUM CHLORIDE 0.9 % IV SOLN
Freq: Once | INTRAVENOUS | Status: AC
  Administered 2012-09-30: 14:00:00 via INTRAVENOUS

## 2012-09-30 MED ORDER — DEXAMETHASONE SODIUM PHOSPHATE 10 MG/ML IJ SOLN
10.0000 mg | Freq: Once | INTRAMUSCULAR | Status: AC
  Administered 2012-09-30: 10 mg via INTRAVENOUS

## 2012-09-30 MED ORDER — ONDANSETRON 8 MG/50ML IVPB (CHCC)
8.0000 mg | Freq: Once | INTRAVENOUS | Status: AC
  Administered 2012-09-30: 8 mg via INTRAVENOUS

## 2012-09-30 MED ORDER — DEXAMETHASONE SODIUM PHOSPHATE 10 MG/ML IJ SOLN
INTRAMUSCULAR | Status: AC
  Administered 2012-09-30: 10 mg via INTRAVENOUS
  Filled 2012-09-30: qty 1

## 2012-09-30 NOTE — Patient Instructions (Signed)
[facility] Discharge Instructions for Patients Receiving Chemotherapy  Today you received the following chemotherapy agents:  etoposide  To help prevent nausea and vomiting after your treatment, we encourage you to take your nausea medication.  Take it as often as prescribed.     If you develop nausea and vomiting that is not controlled by your nausea medication, call the clinic. If it is after clinic hours your family physician or the after hours number for the clinic or go to the Emergency Department.   BELOW ARE SYMPTOMS THAT SHOULD BE REPORTED IMMEDIATELY:  *FEVER GREATER THAN 100.5 F  *CHILLS WITH OR WITHOUT FEVER  NAUSEA AND VOMITING THAT IS NOT CONTROLLED WITH YOUR NAUSEA MEDICATION  *UNUSUAL SHORTNESS OF BREATH  *UNUSUAL BRUISING OR BLEEDING  TENDERNESS IN MOUTH AND THROAT WITH OR WITHOUT PRESENCE OF ULCERS  *URINARY PROBLEMS  *BOWEL PROBLEMS  UNUSUAL RASH Items with * indicate a potential emergency and should be followed up as soon as possible.  Feel free to call the clinic you have any questions or concerns. The clinic phone number is [phone].   I have been informed and understand all the instructions given to me. I know to contact the clinic, my physician, or go to the Emergency Department if any problems should occur. I do not have any questions at this time, but understand that I may call the clinic during office hours   should I have any questions or need assistance in obtaining follow up care.    __________________________________________  _____________  __________ Signature of Patient or Authorized Representative            Date                   Time    __________________________________________ Nurse's Signature    

## 2012-09-30 NOTE — Procedures (Addendum)
Recurrent right effusion, new filmy septations noted on today's imaging. Successful US guided right  thoracentesis. Yielded of clear yellow fluid. Pt again experienced pain after removing about . No immediate complications.  Specimen was not sent for labs. CXR ordered.  Brayton El PA-C 09/30/2012 10:42 AM

## 2012-10-01 ENCOUNTER — Encounter: Payer: Self-pay | Admitting: Internal Medicine

## 2012-10-01 ENCOUNTER — Encounter: Payer: Self-pay | Admitting: *Deleted

## 2012-10-01 ENCOUNTER — Ambulatory Visit (HOSPITAL_BASED_OUTPATIENT_CLINIC_OR_DEPARTMENT_OTHER): Payer: Medicare Other

## 2012-10-01 VITALS — BP 132/64 | HR 67 | Temp 97.7°F | Resp 18

## 2012-10-01 DIAGNOSIS — C3491 Malignant neoplasm of unspecified part of right bronchus or lung: Secondary | ICD-10-CM

## 2012-10-01 DIAGNOSIS — Z5111 Encounter for antineoplastic chemotherapy: Secondary | ICD-10-CM

## 2012-10-01 DIAGNOSIS — C3490 Malignant neoplasm of unspecified part of unspecified bronchus or lung: Secondary | ICD-10-CM

## 2012-10-01 MED ORDER — SODIUM CHLORIDE 0.9 % IV SOLN
120.0000 mg/m2 | Freq: Once | INTRAVENOUS | Status: AC
  Administered 2012-10-01: 260 mg via INTRAVENOUS
  Filled 2012-10-01: qty 13

## 2012-10-01 MED ORDER — ONDANSETRON 8 MG/50ML IVPB (CHCC)
8.0000 mg | Freq: Once | INTRAVENOUS | Status: AC
  Administered 2012-10-01: 8 mg via INTRAVENOUS

## 2012-10-01 MED ORDER — ONDANSETRON 8 MG/NS 50 ML IVPB
INTRAVENOUS | Status: AC
  Filled 2012-10-01: qty 8

## 2012-10-01 MED ORDER — DEXAMETHASONE SODIUM PHOSPHATE 10 MG/ML IJ SOLN
INTRAMUSCULAR | Status: AC
  Filled 2012-10-01: qty 1

## 2012-10-01 MED ORDER — SODIUM CHLORIDE 0.9 % IV SOLN
Freq: Once | INTRAVENOUS | Status: AC
  Administered 2012-10-01: 14:00:00 via INTRAVENOUS

## 2012-10-01 MED ORDER — HEPARIN SOD (PORK) LOCK FLUSH 100 UNIT/ML IV SOLN
500.0000 [IU] | Freq: Once | INTRAVENOUS | Status: AC | PRN
  Administered 2012-10-01: 500 [IU]
  Filled 2012-10-01: qty 5

## 2012-10-01 MED ORDER — DEXAMETHASONE SODIUM PHOSPHATE 10 MG/ML IJ SOLN
10.0000 mg | Freq: Once | INTRAMUSCULAR | Status: AC
  Administered 2012-10-01: 10 mg via INTRAVENOUS

## 2012-10-01 MED ORDER — SODIUM CHLORIDE 0.9 % IJ SOLN
10.0000 mL | INTRAMUSCULAR | Status: DC | PRN
  Administered 2012-10-01: 10 mL
  Filled 2012-10-01: qty 10

## 2012-10-01 NOTE — Patient Instructions (Signed)
[facility] Discharge Instructions for Patients Receiving Chemotherapy  Today you received the following chemotherapy agent Etoposide (VP-16)  To help prevent nausea and vomiting after your treatment, we encourage you to take your nausea medication.   If you develop nausea and vomiting that is not controlled by your nausea medication, call the clinic.   BELOW ARE SYMPTOMS THAT SHOULD BE REPORTED IMMEDIATELY:  *FEVER GREATER THAN 100.5 F  *CHILLS WITH OR WITHOUT FEVER  NAUSEA AND VOMITING THAT IS NOT CONTROLLED WITH YOUR NAUSEA MEDICATION  *UNUSUAL SHORTNESS OF BREATH  *UNUSUAL BRUISING OR BLEEDING  TENDERNESS IN MOUTH AND THROAT WITH OR WITHOUT PRESENCE OF ULCERS  *URINARY PROBLEMS  *BOWEL PROBLEMS  UNUSUAL RASH Items with * indicate a potential emergency and should be followed up as soon as possible.  Feel free to call the clinic you have any questions or concerns. The clinic phone number is [phone].   Etoposide, VP-16 injection What is this medicine? ETOPOSIDE, VP-16 (e toe POE side) is a chemotherapy drug. It is used to treat testicular cancer, lung cancer, and other cancers. This medicine may be used for other purposes; ask your health care provider or pharmacist if you have questions. What should I tell my health care provider before I take this medicine? They need to know if you have any of these conditions: -infection -kidney disease -low blood counts, like low white cell, platelet, or red cell counts -an unusual or allergic reaction to etoposide, other chemotherapeutic agents, other medicines, foods, dyes, or preservatives -pregnant or trying to get pregnant -breast-feeding How should I use this medicine? This medicine is for infusion into a vein. It is administered in a hospital or clinic by a specially trained health care professional. Talk to your pediatrician regarding the use of this medicine in children. Special care may be  needed. Overdosage: If you think you have taken too much of this medicine contact a poison control center or emergency room at once. NOTE: This medicine is only for you. Do not share this medicine with others. What if I miss a dose? It is important not to miss your dose. Call your doctor or health care professional if you are unable to keep an appointment. What may interact with this medicine? -cyclosporine -medicines to increase blood counts like filgrastim, pegfilgrastim, sargramostim -vaccines This list may not describe all possible interactions. Give your health care provider a list of all the medicines, herbs, non-prescription drugs, or dietary supplements you use. Also tell them if you smoke, drink alcohol, or use illegal drugs. Some items may interact with your medicine. What should I watch for while using this medicine? Visit your doctor for checks on your progress. This drug may make you feel generally unwell. This is not uncommon, as chemotherapy can affect healthy cells as well as cancer cells. Report any side effects. Continue your course of treatment even though you feel ill unless your doctor tells you to stop. In some cases, you may be given additional medicines to help with side effects. Follow all directions for their use. Call your doctor or health care professional for advice if you get a fever, chills or sore throat, or other symptoms of a cold or flu. Do not treat yourself. This drug decreases your body's ability to fight infections. Try to avoid being around people who are sick. This medicine may increase your risk to bruise or bleed. Call your doctor or health care professional if you notice any unusual bleeding. Be careful brushing  and flossing your teeth or using a toothpick because you may get an infection or bleed more easily. If you have any dental work done, tell your dentist you are receiving this medicine. Avoid taking products that contain aspirin, acetaminophen,  ibuprofen, naproxen, or ketoprofen unless instructed by your doctor. These medicines may hide a fever. Do not become pregnant while taking this medicine. Women should inform their doctor if they wish to become pregnant or think they might be pregnant. There is a potential for serious side effects to an unborn child. Talk to your health care professional or pharmacist for more information. Do not breast-feed an infant while taking this medicine. What side effects may I notice from receiving this medicine? Side effects that you should report to your doctor or health care professional as soon as possible: -allergic reactions like skin rash, itching or hives, swelling of the face, lips, or tongue -low blood counts - this medicine may decrease the number of white blood cells, red blood cells and platelets. You may be at increased risk for infections and bleeding. -signs of infection - fever or chills, cough, sore throat, pain or difficulty passing urine -signs of decreased platelets or bleeding - bruising, pinpoint red spots on the skin, black, tarry stools, blood in the urine -signs of decreased red blood cells - unusually weak or tired, fainting spells, lightheadedness -breathing problems -changes in vision -mouth or throat sores or ulcers -pain, redness, swelling or irritation at the injection site -pain, tingling, numbness in the hands or feet -redness, blistering, peeling or loosening of the skin, including inside the mouth -seizures -vomiting Side effects that usually do not require medical attention (report to your doctor or health care professional if they continue or are bothersome): -diarrhea -hair loss -loss of appetite -nausea -stomach pain This list may not describe all possible side effects. Call your doctor for medical advice about side effects. You may report side effects to FDA at 1-800-FDA-1088. Where should I keep my medicine? This drug is given in a hospital or clinic and will  not be stored at home. NOTE: This sheet is a summary. It may not cover all possible information. If you have questions about this medicine, talk to your doctor, pharmacist, or health care provider.  2012, [employer]. (05/10/2007 5:24:12 PM)

## 2012-10-01 NOTE — Patient Instructions (Signed)
CURRENT THERAPY: Systemic chemotherapy with carboplatin for AUC of 5 on day 1 and etoposide 120 mg/M2 on days 1, 2 and 3 with Neulasta support on day 4, status post 2 cycles. First dose was given on 08/18/2012.  CHEMOTHERAPY INTENT: Palliative  CURRENT # OF CHEMOTHERAPY CYCLES: 2  CURRENT ANTIEMETICS: Zofran, dexamethasone and Compazine  CURRENT SMOKING STATUS: Never smoker  ORAL CHEMOTHERAPY AND CONSENT: None  CURRENT BISPHOSPHONATES USE: None  PAIN MANAGEMENT: 2/10, Tyleno#3 is changed to Percocet  NARCOTICS INDUCED CONSTIPATION: None  LIVING WILL AND CODE STATUS: Full code.

## 2012-10-02 ENCOUNTER — Ambulatory Visit (HOSPITAL_BASED_OUTPATIENT_CLINIC_OR_DEPARTMENT_OTHER): Payer: Medicare Other

## 2012-10-02 VITALS — BP 0/0 | HR 59 | Temp 97.0°F | Resp 20

## 2012-10-02 DIAGNOSIS — C342 Malignant neoplasm of middle lobe, bronchus or lung: Secondary | ICD-10-CM

## 2012-10-02 DIAGNOSIS — C3491 Malignant neoplasm of unspecified part of right bronchus or lung: Secondary | ICD-10-CM

## 2012-10-02 DIAGNOSIS — Z5189 Encounter for other specified aftercare: Secondary | ICD-10-CM

## 2012-10-02 MED ORDER — PEGFILGRASTIM INJECTION 6 MG/0.6ML
6.0000 mg | Freq: Once | SUBCUTANEOUS | Status: AC
  Administered 2012-10-02: 6 mg via SUBCUTANEOUS

## 2012-10-05 ENCOUNTER — Other Ambulatory Visit: Payer: Self-pay | Admitting: *Deleted

## 2012-10-06 ENCOUNTER — Other Ambulatory Visit (HOSPITAL_BASED_OUTPATIENT_CLINIC_OR_DEPARTMENT_OTHER): Payer: Medicare Other | Admitting: Lab

## 2012-10-06 ENCOUNTER — Other Ambulatory Visit: Payer: Self-pay | Admitting: Medical Oncology

## 2012-10-06 ENCOUNTER — Telehealth: Payer: Self-pay | Admitting: *Deleted

## 2012-10-06 DIAGNOSIS — C3491 Malignant neoplasm of unspecified part of right bronchus or lung: Secondary | ICD-10-CM

## 2012-10-06 DIAGNOSIS — C3490 Malignant neoplasm of unspecified part of unspecified bronchus or lung: Secondary | ICD-10-CM

## 2012-10-06 LAB — CBC WITH DIFFERENTIAL/PLATELET
BASO%: 0.5 % (ref 0.0–2.0)
Basophils Absolute: 0 10e3/uL (ref 0.0–0.1)
EOS%: 0.2 % (ref 0.0–7.0)
Eosinophils Absolute: 0 10e3/uL (ref 0.0–0.5)
HCT: 25.6 % — ABNORMAL LOW (ref 38.4–49.9)
HGB: 8.6 g/dL — ABNORMAL LOW (ref 13.0–17.1)
LYMPH%: 3.2 % — ABNORMAL LOW (ref 14.0–49.0)
MCH: 30.1 pg (ref 27.2–33.4)
MCHC: 33.6 g/dL (ref 32.0–36.0)
MCV: 89.6 fL (ref 79.3–98.0)
MONO#: 0.2 10e3/uL (ref 0.1–0.9)
MONO%: 2.1 % (ref 0.0–14.0)
NEUT#: 9.6 10e3/uL — ABNORMAL HIGH (ref 1.5–6.5)
NEUT%: 94 % — ABNORMAL HIGH (ref 39.0–75.0)
Platelets: 201 10e3/uL (ref 140–400)
RBC: 2.86 10e6/uL — ABNORMAL LOW (ref 4.20–5.82)
RDW: 17.4 % — ABNORMAL HIGH (ref 11.0–14.6)
WBC: 10.3 10e3/uL (ref 4.0–10.3)
lymph#: 0.3 10e3/uL — ABNORMAL LOW (ref 0.9–3.3)

## 2012-10-06 LAB — COMPREHENSIVE METABOLIC PANEL (CC13)
CO2: 28 mEq/L (ref 22–29)
Glucose: 121 mg/dl (ref 70–140)
Sodium: 140 mEq/L (ref 136–145)
Total Bilirubin: 0.27 mg/dL (ref 0.20–1.20)
Total Protein: 6.3 g/dL — ABNORMAL LOW (ref 6.4–8.3)

## 2012-10-06 NOTE — Telephone Encounter (Signed)
Pt wife called upset regarding not having lab work today.  I called back and left a vm message for an appt for labs at 1:15 today

## 2012-10-07 ENCOUNTER — Other Ambulatory Visit: Payer: Self-pay | Admitting: Medical Oncology

## 2012-10-07 DIAGNOSIS — C3491 Malignant neoplasm of unspecified part of right bronchus or lung: Secondary | ICD-10-CM

## 2012-10-07 MED ORDER — OXYCODONE-ACETAMINOPHEN 5-325 MG PO TABS: 1.0000 | ORAL_TABLET | ORAL | Status: DC | PRN

## 2012-10-07 NOTE — Telephone Encounter (Signed)
I returned call and told Samuel Hurley she can pick up rx tomorrow. rx locked in injection room

## 2012-10-13 ENCOUNTER — Other Ambulatory Visit (HOSPITAL_BASED_OUTPATIENT_CLINIC_OR_DEPARTMENT_OTHER): Payer: Medicare Other | Admitting: Lab

## 2012-10-13 DIAGNOSIS — C3490 Malignant neoplasm of unspecified part of unspecified bronchus or lung: Secondary | ICD-10-CM

## 2012-10-13 DIAGNOSIS — C3491 Malignant neoplasm of unspecified part of right bronchus or lung: Secondary | ICD-10-CM

## 2012-10-13 LAB — CBC WITH DIFFERENTIAL/PLATELET
Eosinophils Absolute: 0 10*3/uL (ref 0.0–0.5)
LYMPH%: 7.1 % — ABNORMAL LOW (ref 14.0–49.0)
MONO#: 1 10*3/uL — ABNORMAL HIGH (ref 0.1–0.9)
NEUT#: 9 10*3/uL — ABNORMAL HIGH (ref 1.5–6.5)
Platelets: 39 10*3/uL — ABNORMAL LOW (ref 140–400)
RBC: 2.79 10*6/uL — ABNORMAL LOW (ref 4.20–5.82)
RDW: 17.2 % — ABNORMAL HIGH (ref 11.0–14.6)
WBC: 10.8 10*3/uL — ABNORMAL HIGH (ref 4.0–10.3)

## 2012-10-13 LAB — COMPREHENSIVE METABOLIC PANEL (CC13)
Albumin: 2.6 g/dL — ABNORMAL LOW (ref 3.5–5.0)
CO2: 24 mEq/L (ref 22–29)
Calcium: 8.8 mg/dL (ref 8.4–10.4)
Chloride: 108 mEq/L (ref 98–109)
Glucose: 131 mg/dl (ref 70–140)
Potassium: 3.5 mEq/L (ref 3.5–5.1)
Sodium: 142 mEq/L (ref 136–145)
Total Protein: 6.3 g/dL — ABNORMAL LOW (ref 6.4–8.3)

## 2012-10-20 ENCOUNTER — Ambulatory Visit: Payer: Medicare HMO | Admitting: Lab

## 2012-10-20 ENCOUNTER — Ambulatory Visit (HOSPITAL_BASED_OUTPATIENT_CLINIC_OR_DEPARTMENT_OTHER): Payer: Medicare Other

## 2012-10-20 ENCOUNTER — Ambulatory Visit (HOSPITAL_BASED_OUTPATIENT_CLINIC_OR_DEPARTMENT_OTHER): Payer: Medicare Other | Admitting: Physician Assistant

## 2012-10-20 ENCOUNTER — Ambulatory Visit: Payer: Medicare HMO

## 2012-10-20 ENCOUNTER — Ambulatory Visit (HOSPITAL_COMMUNITY)
Admission: RE | Admit: 2012-10-20 | Discharge: 2012-10-20 | Disposition: A | Discharge status: Home / Self Care | Payer: Medicare Other | Source: Ambulatory Visit | Attending: Internal Medicine | Admitting: Internal Medicine

## 2012-10-20 ENCOUNTER — Other Ambulatory Visit (HOSPITAL_BASED_OUTPATIENT_CLINIC_OR_DEPARTMENT_OTHER): Payer: Medicare Other | Admitting: Lab

## 2012-10-20 ENCOUNTER — Encounter: Payer: Self-pay | Admitting: Physician Assistant

## 2012-10-20 ENCOUNTER — Telehealth: Payer: Self-pay | Admitting: Internal Medicine

## 2012-10-20 VITALS — BP 122/68 | HR 82 | Temp 98.4°F | Resp 19 | Ht 72.0 in | Wt 207.4 lb

## 2012-10-20 DIAGNOSIS — C3490 Malignant neoplasm of unspecified part of unspecified bronchus or lung: Secondary | ICD-10-CM

## 2012-10-20 DIAGNOSIS — D6481 Anemia due to antineoplastic chemotherapy: Secondary | ICD-10-CM

## 2012-10-20 DIAGNOSIS — T451X5A Adverse effect of antineoplastic and immunosuppressive drugs, initial encounter: Secondary | ICD-10-CM | POA: Insufficient documentation

## 2012-10-20 DIAGNOSIS — J90 Pleural effusion, not elsewhere classified: Secondary | ICD-10-CM

## 2012-10-20 DIAGNOSIS — R110 Nausea: Secondary | ICD-10-CM

## 2012-10-20 DIAGNOSIS — C342 Malignant neoplasm of middle lobe, bronchus or lung: Secondary | ICD-10-CM

## 2012-10-20 DIAGNOSIS — R5381 Other malaise: Secondary | ICD-10-CM

## 2012-10-20 DIAGNOSIS — C3491 Malignant neoplasm of unspecified part of right bronchus or lung: Secondary | ICD-10-CM

## 2012-10-20 DIAGNOSIS — Z5111 Encounter for antineoplastic chemotherapy: Secondary | ICD-10-CM

## 2012-10-20 HISTORY — PX: THORACENTESIS: SHX235

## 2012-10-20 LAB — CBC WITH DIFFERENTIAL/PLATELET
BASO%: 0.3 % (ref 0.0–2.0)
Eosinophils Absolute: 0 10*3/uL (ref 0.0–0.5)
MONO#: 0.9 10*3/uL (ref 0.1–0.9)
NEUT#: 7.6 10*3/uL — ABNORMAL HIGH (ref 1.5–6.5)
RBC: 2.68 10*6/uL — ABNORMAL LOW (ref 4.20–5.82)
RDW: 19.8 % — ABNORMAL HIGH (ref 11.0–14.6)
WBC: 9.1 10*3/uL (ref 4.0–10.3)

## 2012-10-20 LAB — COMPREHENSIVE METABOLIC PANEL (CC13)
ALT: <6 U/L (ref 0–55)
AST: 8 U/L (ref 5–34)
Albumin: 2.6 g/dL — ABNORMAL LOW (ref 3.5–5.0)
Alkaline Phosphatase: 108 U/L (ref 40–150)
CO2: 24 mEq/L (ref 22–29)
Calcium: 8.8 mg/dL (ref 8.4–10.4)
Chloride: 110 mEq/L — ABNORMAL HIGH (ref 98–109)
Creatinine: 0.9 mg/dL (ref 0.7–1.3)
Sodium: 143 mEq/L (ref 136–145)
Total Bilirubin: <0.2 mg/dL (ref 0.20–1.20)
Total Protein: 6.5 g/dL (ref 6.4–8.3)

## 2012-10-20 LAB — HOLD TUBE, BLOOD BANK

## 2012-10-20 MED ORDER — SODIUM CHLORIDE 0.9 % IJ SOLN
10.0000 mL | INTRAMUSCULAR | Status: DC | PRN
  Administered 2012-10-20: 10 mL
  Filled 2012-10-20: qty 10

## 2012-10-20 MED ORDER — ONDANSETRON 16 MG/50ML IVPB (CHCC)
INTRAVENOUS | Status: AC
  Filled 2012-10-20: qty 16

## 2012-10-20 MED ORDER — HEPARIN SOD (PORK) LOCK FLUSH 100 UNIT/ML IV SOLN
500.0000 [IU] | Freq: Once | INTRAVENOUS | Status: AC | PRN
  Administered 2012-10-20: 500 [IU]
  Filled 2012-10-20: qty 5

## 2012-10-20 MED ORDER — DEXAMETHASONE SODIUM PHOSPHATE 20 MG/5ML IJ SOLN
INTRAMUSCULAR | Status: AC
  Filled 2012-10-20: qty 5

## 2012-10-20 MED ORDER — FENTANYL 12 MCG/HR TD PT72: 1.0000 | MEDICATED_PATCH | TRANSDERMAL | Status: DC

## 2012-10-20 MED ORDER — SODIUM CHLORIDE 0.9 % IJ SOLN
10.0000 mL | INTRAMUSCULAR | Status: DC | PRN
  Administered 2012-10-20: 10 mL via INTRAVENOUS
  Filled 2012-10-20: qty 10

## 2012-10-20 MED ORDER — SODIUM CHLORIDE 0.9 % IV SOLN
120.0000 mg/m2 | Freq: Once | INTRAVENOUS | Status: AC
  Administered 2012-10-20: 260 mg via INTRAVENOUS
  Filled 2012-10-20: qty 13

## 2012-10-20 MED ORDER — SODIUM CHLORIDE 0.9 % IV SOLN
530.0000 mg | Freq: Once | INTRAVENOUS | Status: AC
  Administered 2012-10-20: 530 mg via INTRAVENOUS
  Filled 2012-10-20: qty 53

## 2012-10-20 MED ORDER — HEPARIN SOD (PORK) LOCK FLUSH 100 UNIT/ML IV SOLN
500.0000 [IU] | Freq: Once | INTRAVENOUS | Status: AC
  Administered 2012-10-20: 500 [IU] via INTRAVENOUS
  Filled 2012-10-20: qty 5

## 2012-10-20 MED ORDER — SODIUM CHLORIDE 0.9 % IV SOLN
Freq: Once | INTRAVENOUS | Status: AC
  Administered 2012-10-20: 16:00:00 via INTRAVENOUS

## 2012-10-20 MED ORDER — ONDANSETRON 16 MG/50ML IVPB (CHCC)
16.0000 mg | Freq: Once | INTRAVENOUS | Status: AC
  Administered 2012-10-20: 16 mg via INTRAVENOUS

## 2012-10-20 MED ORDER — DEXAMETHASONE SODIUM PHOSPHATE 20 MG/5ML IJ SOLN
20.0000 mg | Freq: Once | INTRAMUSCULAR | Status: AC
  Administered 2012-10-20: 20 mg via INTRAVENOUS

## 2012-10-20 NOTE — Patient Instructions (Signed)
You are being started on a low dose Duragesic patch for pain control Stop taking the extra Tylenol tablets that you have been taking You will be transfused 2 units of blood for your chemotherapy induced chemotherapy Continue weekly labs as scheduled Follow up with Dr. Arbutus Ped in 3 weeks with a restaging CT scan of your chest, abdomen and pelvis to re-evaluate your disease

## 2012-10-20 NOTE — Telephone Encounter (Signed)
gv wife appt schedule for October and November. [facility] will call w/ct - wife aware. Pt will have PRBC's 1unit 10/2 and 1unit 10/3. Pt will get tubes from lb and have type and cross while pt in inf.

## 2012-10-20 NOTE — Progress Notes (Signed)
Ok to treat with Hgb 7.9 per Tiana Loft.  Pt to be type& crossed for 2 units blood this week.

## 2012-10-20 NOTE — Progress Notes (Addendum)
[facility] [phone]   [phone]  SHARED VISIT PROGRESS NOTE  Samuel Regulus, MD [address] [place] [address]  DIAGNOSIS AND STAGE: Extensive stage small cell lung cancer diagnosed in July 2014.   PRIOR THERAPY: None   CURRENT THERAPY: Systemic chemotherapy with carboplatin for AUC of 5 on day 1 and etoposide 120 mg/M2 on days 1, 2 and 3 with Neulasta support on day 4, status post 3 cycles. First dose was given on 08/18/2012.   CHEMOTHERAPY INTENT: Palliative  CURRENT # OF CHEMOTHERAPY CYCLES: 3 CURRENT ANTIEMETICS: Zofran, dexamethasone and Compazine  CURRENT SMOKING STATUS: Never smoker  ORAL CHEMOTHERAPY AND CONSENT: None  CURRENT BISPHOSPHONATES USE: None  PAIN MANAGEMENT: 2/10, Tyleno#3 is changed to Percocet  NARCOTICS INDUCED CONSTIPATION: None  LIVING WILL AND CODE STATUS: Full code.   INTERVAL HISTORY: Samuel Hurley 78 y.o. male returns to the clinic today for follow up visit accompanied by his wife [name]. The patient continues to complain of increasing fatigue and weakness.  He tolerated the third cycle of his systemic chemotherapy with carboplatin and etoposide fairly well except for the fatigue and weakness as well as mild nausea. He denied having any significant fever or chills. He does complain of increased swelling in his feet and hands. He is wondering whether he needs some Lasix. He reports that he drinks perhaps 416 ounce bottles of water daily as well as a similar size bottle of Pepsi daily. He complains of pain in his left rib cage that travels across his sternum and then onto his right ribs cage. His current pain medication Percocet 5/325 is not addressing his pain management. He currently takes 1 Percocet tablet by mouth, 3 times daily with an additional 1000 mg of Tylenol each time. He states that this is what he takes when the pain is at its worst. He might take about one pain pill on days when the pain  is not so bad or he may not take any pain medication at all. 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 He denied any significant shortness of breath. He has no cough or hemoptysis. He has no significant weight loss or night sweats.   MEDICAL HISTORY: Past Medical History  Diagnosis Date  . Hyperlipidemia   . OSA (obstructive sleep apnea)   . BPH (benign prostatic hypertrophy)   . Kidney disease     TCC  . Xerostomia   . Pharynx cancer     squamous cell stage 4,s/p XRT,chemo, neck dissection  . Colon polyp     HYPERPLASTIC & TUBULAR ADENOMA(Colonoscopy-Dr.Goliad)  . Diverticulosis of colon (without mention of hemorrhage)     (Colonoscopy-Dr.[place])  . Internal hemorrhoids without mention of complication     (Colonoscopy-Dr.[place])  . GERD (gastroesophageal reflux disease)     (EGD-Dr. Corinda Gubler)  . Atrophic gastritis without mention of hemorrhage     (EGD-Dr. Corinda Gubler)    ALLERGIES:  is allergic to erythromycin and tetracycline.  MEDICATIONS:  Current Outpatient Prescriptions  Medication Sig Dispense Refill  . Alum & Mag Hydroxide-Simeth (MAGIC MOUTHWASH) SOLN Take 5 mLs by mouth 4 (four) times daily as needed.  240 mL  0  . benzonatate (TESSALON) 200 MG capsule Take 200 mg by mouth 3 (three) times daily as needed for cough.      . chlorhexidine (PERIDEX) 0.12 % solution Use as directed 15 mLs in the mouth or throat 2 (two) times  daily. 1/2 oz rinse and spit.      . cyanocobalamin 2000 MCG tablet Take 2,000 mcg by mouth daily.        Marland Kitchen dexlansoprazole (DEXILANT) 60 MG capsule Take 1 capsule (60 mg total) by mouth daily.  5 capsule  0  . dextromethorphan-guaiFENesin (MUCINEX DM) 30-600 MG per 12 hr tablet Take 1 tablet by mouth every 12 (twelve) hours.      . fentaNYL (DURAGESIC) 12 MCG/HR Place 1 patch (12.5 mcg total) onto the skin every 3 (three) days.  10 patch  0  . finasteride (PROSCAR) 5 MG tablet Take 5 mg by mouth daily.        Marland Kitchen levothyroxine  (SYNTHROID, LEVOTHROID) 75 MCG tablet Take 75 mcg by mouth daily.        Marland Kitchen lidocaine-prilocaine (EMLA) cream Apply topically as needed.  30 g  0  . LORazepam (ATIVAN) 0.5 MG tablet Take 1 tablet (0.5 mg total) by mouth at bedtime as needed (insomnia).  30 tablet  0  . LORazepam (ATIVAN) 1 MG tablet 1 tablet by mouth once for MRI  2 tablet  0  . oxyCODONE-acetaminophen (PERCOCET/ROXICET) 5-325 MG per tablet Take 1 tablet by mouth every 4 (four) hours as needed for pain.  60 tablet  0  . prochlorperazine (COMPAZINE) 10 MG tablet Take 1 tablet (10 mg total) by mouth every 6 (six) hours as needed.  60 tablet  0  . terazosin (HYTRIN) 1 MG capsule Take 1 mg by mouth 2 (two) times daily.        Current Facility-Administered Medications  Medication Dose Route Frequency Provider Last Rate Last Dose  . TDaP (BOOSTRIX) injection 0.5 mL  0.5 mL Intramuscular Once Jacques Navy, MD       Facility-Administered Medications Ordered in Other Visits  Medication Dose Route Frequency Provider Last Rate Last Dose  . etoposide (VEPESID) 260 mg in sodium chloride 0.9 % 650 mL chemo infusion  120 mg/m2 (Treatment Plan Actual) Intravenous Once Conni Slipper, PA-C 663 mL/hr at 10/20/12 1637 260 mg at 10/20/12 1637  . heparin lock flush 100 unit/mL  500 Units Intracatheter Once PRN Maclin Guerrette E Jonell Brumbaugh, PA-C      . sodium chloride 0.9 % injection 10 mL  10 mL Intracatheter PRN Conni Slipper, PA-C        SURGICAL HISTORY:  Past Surgical History  Procedure Laterality Date  . Appendectomy    . Lumbar laminectomy    . Tonsillectomy    . Bilateral ganglionectomies    . Radical right neck dissection  2008  . Pci-rfa renal cell (tcc) cancer  2009    pt denies  . Cholecystectomy  dec. 2009  . Septoplasty  03/01/12    with double turbinectomy  . Video bronchoscopy Bilateral 08/10/2012    Procedure: VIDEO BRONCHOSCOPY WITH FLUORO;  Surgeon: Oretha Milch, MD;  Location: [facility] ENDOSCOPY;  Service: Cardiopulmonary;   Laterality: Bilateral;    REVIEW OF SYSTEMS:  Constitutional: positive for fatigue Eyes: negative Ears, nose, mouth, throat, and face: negative Respiratory: positive for dyspnea on exertion Cardiovascular: positive for lower extremity edema Gastrointestinal: negative Genitourinary:negative Integument/breast: negative Hematologic/lymphatic: negative Musculoskeletal:positive for bone pain Neurological: negative Behavioral/Psych: negative Endocrine: negative Allergic/Immunologic: negative   PHYSICAL EXAMINATION: General appearance: alert, cooperative, fatigued and no distress Head: Normocephalic, without obvious abnormality, atraumatic Neck: no adenopathy, no JVD and thyroid not enlarged, symmetric, no tenderness/mass/nodules Lymph nodes: Cervical, supraclavicular, and axillary nodes normal. Resp: diminished breath sounds RLL  Back: symmetric, no curvature. ROM normal. No CVA tenderness. Cardio: regular rate and rhythm, S1, S2 normal, no murmur, click, rub or gallop GI: soft, non-tender; bowel sounds normal; no masses,  no organomegaly Extremities: edema Trace to 1+ pitting edema bilateral lower extremities Neurologic: Alert and oriented X 3, normal strength and tone. Normal symmetric reflexes. Normal coordination and gait Skin reveals decreased skin turgor  ECOG PERFORMANCE STATUS: 1 - Symptomatic but completely ambulatory  Blood pressure 122/68, pulse 82, temperature 98.4 F (36.9 C), temperature source Oral, resp. rate 19, height 6' (1.829 m), weight 207 lb 6.4 oz (94.076 kg).  LABORATORY DATA: Lab Results  Component Value Date   WBC 9.1 10/20/2012   HGB 7.9* 10/20/2012   HCT 24.0* 10/20/2012   MCV 89.7 10/20/2012   PLT 177 10/20/2012      Chemistry      Component Value Date/Time   NA 143 10/20/2012 1304   NA 138 05/13/2012 1005   K 3.9 10/20/2012 1304   K 3.7 05/13/2012 1005   CL 101 05/13/2012 1005   CO2 24 10/20/2012 1304   CO2 30 05/13/2012 1005   BUN 16.8 10/20/2012 1304    BUN 12 05/13/2012 1005   CREATININE 0.9 10/20/2012 1304   CREATININE 1.0 05/13/2012 1005      Component Value Date/Time   CALCIUM 8.8 10/20/2012 1304   CALCIUM 8.7 05/13/2012 1005   ALKPHOS 108 10/20/2012 1304   ALKPHOS 76 05/13/2012 1005   AST 8 10/20/2012 1304   AST 16 05/13/2012 1005   ALT <6 10/20/2012 1304   ALT 11 05/13/2012 1005   BILITOT <0.20 10/20/2012 1304   BILITOT 0.5 05/13/2012 1005       RADIOGRAPHIC STUDIES: Ct Chest W Contrast  09/27/2012   *RADIOLOGY REPORT*  Clinical Data:  Right-sided small cell lung cancer.  History of tongue cancer in 2008, chemotherapy and XRT complete.  History of left renal cancer status post RFA 2008.  Prior cholecystectomy and appendectomy.  CT CHEST, ABDOMEN AND PELVIS WITH CONTRAST  Technique:  Multidetector CT imaging of the chest, abdomen and pelvis was performed following the standard protocol during bolus administration of intravenous contrast.  Contrast: OMNIPAQUE IOHEXOL 300 MG/ML  SOLN  Comparison:  PET CT dated 08/06/2012.    CT CHEST  Findings:  Volume loss in the right hemithorax.  Moderate to large right pleural effusion with thickened pleural rind (series 2/image 43), likely malignant, grossly unchanged. Mild pleural-based nodularity at the anterior lung base measures 9 mm (series 2/image 31).  Underlying mass-like consolidation of the right middle lobe, measuring 2.2 x 5.8 cm (series 2/image 32), hypermetabolic on prior PET and corresponding to known small cell cancer, mildly improved. Additional mass-like consolidation in the right lower lobe, measuring 2.9 x 4.5 cm (series 2/image 47), non-FDG-avid.  Mild cardiomegaly.  No pericardial effusion.  Mild atherosclerotic calcifications of the aortic arch.  Left chest port.  7 mm short axis precarinal node with preservation of the normal fatty hilum (series 2/image 24).  No suspicious hilar or axillary lymphadenopathy.  IMPRESSION: Mass-like consolidation in the right middle lobe, corresponding  to known small cell cancer, mildly improved.  Additional mass-like consolidation in the right lower lobe, non-FDG- avid.  Moderate to large right pleural effusion with thickened pleural rind and nodularity, likely malignant, grossly unchanged.    CT ABDOMEN AND PELVIS  Findings:  Multiple hepatic cysts, including a bilobed 8.5 x 6.5 cm cyst in the central right liver (series 2/image 87).  Spleen, pancreas, and adrenal glands are within normal limits.  Status post cholecystectomy.  No intrahepatic or extrahepatic ductal dilatation.  Postprocedural changes in the lateral interpolar left kidney (series 2/image 72).  Right kidney is within normal limits.  No hydronephrosis.  No evidence of bowel obstruction.  Extensive colonic diverticulosis, without associated inflammatory changes.  Atherosclerotic calcifications of the abdominal aorta and branch vessels.  No abdominopelvic ascites.  No suspicious abdominopelvic lymphadenopathy.  Prostatomegaly, measuring 6.3 cm in maximal dimension, with enlargement of the central gland which indents the base of the bladder.  Bladder is underdistended.  Tiny fat-containing right inguinal hernia.  Degenerative changes of the lumbar spine.  IMPRESSION: No evidence of metastatic disease in the abdomen/pelvis.  Postprocedural changes in the lateral interpolar left kidney.  Additional ancillary findings as above.   Original Report Authenticated By: Charline Bills, M.D.   ASSESSMENT AND PLAN:  1) extensive stage small cell lung cancer currently undergoing systemic chemotherapy with carboplatin and etoposide status post 3 cycles with partial response. Patient discussed with him also seen by Dr. Arbutus Ped. For his chemotherapy-induced anemia with hemoglobin 7.9 g/dL we will will arrange to transfuse him a total of 2 units of packed blood cells. He will proceed with cycle #4 of his systemic chemotherapy with carboplatin and etoposide with Neulasta port. He'll followup with Dr. Arbutus Ped in  3 weeks with a restaging CT scan of the chest, abdomen and pelvis with contrast to reevaluate his disease. 2) right-sided pleural effusion: Patient is status post ultrasound guided right thoracentesis yielding  600 mL is a fluid on 09/30/2012  3) chemotherapy-induced anemia:  patient to receive 2 units of PRBCs transfusion as described above. 4) pain management: Patient to discontinue the thousand milligrams of acetaminophen he is been taken. We will start Duragesic patch at 12.5 mcg every 3 days and titrate as needed. He may still take his Percocet 5/325 mg every 4 hours as needed for breakthrough pain. Patient was given a prescription for 10 Duragesic 12 mcg patches to  [place], [place] E, PA-C   The patient was advised to call immediately if he has any concerning symptoms in the interval.  The patient voices understanding of current disease status and treatment options and is in agreement with the current care plan.  All questions were answered. The patient knows to call the clinic with any problems, questions or concerns. We can certainly see the patient much sooner if necessary.  ADDENDUM: Hematology/Oncology Attending: I have a face to face encounter with the patient today. I recommended his care plan. This is a very pleasant 78 years old white male with extensive stage small cell lung cancer currently undergoing systemic chemotherapy with carboplatin and etoposide status post 3 cycles. He tolerated the last cycle of his treatment fairly well except for the persistent pain on the right side of the chest and he is currently on Percocet and as needed basis but he also takes a lot of Tylenol. I will change his pain medication to start Duragesic patch at a low dose 12.5 mcg/hour every 3 days. The patient can continue taking his Percocet every 4 hours as needed.  For the chemotherapy-induced anemia I will arrange for the patient to receive 2 units of PRBCs transfusion. The patient will proceed with  cycle #4 of his chemotherapy today as scheduled. He would come back for followup visit in 3 weeks with repeat CT scan of the chest, abdomen and pelvis for restaging of his disease. If he has any evidence  for disease progression on the upcoming scan, I would consider rebiopsy of his pleural based lung nodules or any other progressive lesion for reconfirmation of his diagnosis. The patient is also interested in the clinical trial at [facility] I will be happy to make the arrangement for him to see Dr. Eber Hong a lot of his partner at that time after repeating his scan if he has any evidence for disease progression. The patient and his wife agreed to the current plan. Lajuana Matte., MD 10/20/2012

## 2012-10-21 ENCOUNTER — Telehealth: Payer: Self-pay | Admitting: Medical Oncology

## 2012-10-21 ENCOUNTER — Ambulatory Visit (HOSPITAL_BASED_OUTPATIENT_CLINIC_OR_DEPARTMENT_OTHER): Payer: Medicare Other

## 2012-10-21 VITALS — BP 147/73 | HR 69 | Temp 98.7°F | Resp 18

## 2012-10-21 DIAGNOSIS — C342 Malignant neoplasm of middle lobe, bronchus or lung: Secondary | ICD-10-CM

## 2012-10-21 DIAGNOSIS — D6481 Anemia due to antineoplastic chemotherapy: Secondary | ICD-10-CM

## 2012-10-21 DIAGNOSIS — Z5111 Encounter for antineoplastic chemotherapy: Secondary | ICD-10-CM

## 2012-10-21 DIAGNOSIS — C3491 Malignant neoplasm of unspecified part of right bronchus or lung: Secondary | ICD-10-CM

## 2012-10-21 MED ORDER — SODIUM CHLORIDE 0.9 % IV SOLN
120.0000 mg/m2 | Freq: Once | INTRAVENOUS | Status: AC
  Administered 2012-10-21: 260 mg via INTRAVENOUS
  Filled 2012-10-21: qty 13

## 2012-10-21 MED ORDER — HEPARIN SOD (PORK) LOCK FLUSH 100 UNIT/ML IV SOLN
500.0000 [IU] | Freq: Once | INTRAVENOUS | Status: AC | PRN
  Administered 2012-10-21: 500 [IU]
  Filled 2012-10-21: qty 5

## 2012-10-21 MED ORDER — DEXAMETHASONE SODIUM PHOSPHATE 10 MG/ML IJ SOLN
INTRAMUSCULAR | Status: AC
  Filled 2012-10-21: qty 1

## 2012-10-21 MED ORDER — ONDANSETRON 8 MG/NS 50 ML IVPB
INTRAVENOUS | Status: AC
  Filled 2012-10-21: qty 8

## 2012-10-21 MED ORDER — DEXAMETHASONE SODIUM PHOSPHATE 10 MG/ML IJ SOLN
10.0000 mg | Freq: Once | INTRAMUSCULAR | Status: AC
  Administered 2012-10-21: 10 mg via INTRAVENOUS

## 2012-10-21 MED ORDER — SODIUM CHLORIDE 0.9 % IV SOLN
Freq: Once | INTRAVENOUS | Status: AC
  Administered 2012-10-21: 12:00:00 via INTRAVENOUS

## 2012-10-21 MED ORDER — SODIUM CHLORIDE 0.9 % IV SOLN
250.0000 mL | Freq: Once | INTRAVENOUS | Status: AC
  Administered 2012-10-21: 250 mL via INTRAVENOUS

## 2012-10-21 MED ORDER — ONDANSETRON 8 MG/50ML IVPB (CHCC)
8.0000 mg | Freq: Once | INTRAVENOUS | Status: AC
  Administered 2012-10-21: 8 mg via INTRAVENOUS

## 2012-10-21 MED ORDER — SODIUM CHLORIDE 0.9 % IJ SOLN
10.0000 mL | INTRAMUSCULAR | Status: DC | PRN
  Administered 2012-10-21: 10 mL
  Filled 2012-10-21: qty 10

## 2012-10-21 NOTE — Progress Notes (Signed)
Blood started at 1445 at 50 cc/hr.  Patient tolerating well.  Rate increased to 185 cc/hr

## 2012-10-21 NOTE — Progress Notes (Signed)
Unit of blood completed.

## 2012-10-21 NOTE — Patient Instructions (Signed)
[facility] Discharge Instructions for Patients Receiving Chemotherapy  Today you received the following chemotherapy agents etopiside.  To help prevent nausea and vomiting after your treatment, we encourage you to take your nausea medication Ativan 0.5 mg or Compazine 10 mg every 6 hours as needed for nausea or vomiting. If you develop nausea and vomiting that is not controlled by your nausea medication, call the clinic.   BELOW ARE SYMPTOMS THAT SHOULD BE REPORTED IMMEDIATELY:  *FEVER GREATER THAN 100.5 F  *CHILLS WITH OR WITHOUT FEVER  NAUSEA AND VOMITING THAT IS NOT CONTROLLED WITH YOUR NAUSEA MEDICATION  *UNUSUAL SHORTNESS OF BREATH  *UNUSUAL BRUISING OR BLEEDING  TENDERNESS IN MOUTH AND THROAT WITH OR WITHOUT PRESENCE OF ULCERS  *URINARY PROBLEMS  *BOWEL PROBLEMS  UNUSUAL RASH Items with * indicate a potential emergency and should be followed up as soon as possible.  Feel free to call the clinic you have any questions or concerns. The clinic phone number is [phone].     Blood Transfusion Information WHAT IS A BLOOD TRANSFUSION? A transfusion is the replacement of blood or some of its parts. Blood is made up of multiple cells which provide different functions.  Red blood cells carry oxygen and are used for blood loss replacement.  White blood cells fight against infection.  Platelets control bleeding.  Plasma helps clot blood.  Other blood products are available for specialized needs, such as hemophilia or other clotting disorders. BEFORE THE TRANSFUSION  Who gives blood for transfusions?   You may be able to donate blood to be used at a later date on yourself (autologous donation).  Relatives can be asked to donate blood. This is generally not any safer than if you have received blood from a stranger. The same precautions are taken to ensure safety when a relative's blood is donated.  Healthy volunteers who are fully evaluated to make  sure their blood is safe. This is blood bank blood. Transfusion therapy is the safest it has ever been in the practice of medicine. Before blood is taken from a donor, a complete history is taken to make sure that person has no history of diseases nor engages in risky social behavior (examples are intravenous drug use or sexual activity with multiple partners). The donor's travel history is screened to minimize risk of transmitting infections, such as malaria. The donated blood is tested for signs of infectious diseases, such as HIV and hepatitis. The blood is then tested to be sure it is compatible with you in order to minimize the chance of a transfusion reaction. If you or a relative donates blood, this is often done in anticipation of surgery and is not appropriate for emergency situations. It takes many days to process the donated blood. RISKS AND COMPLICATIONS Although transfusion therapy is very safe and saves many lives, the main dangers of transfusion include:   Getting an infectious disease.  Developing a transfusion reaction. This is an allergic reaction to something in the blood you were given. Every precaution is taken to prevent this. The decision to have a blood transfusion has been considered carefully by your caregiver before blood is given. Blood is not given unless the benefits outweigh the risks. AFTER THE TRANSFUSION  Right after receiving a blood transfusion, you will usually feel much better and more energetic. This is especially true if your red blood cells have gotten low (anemic). The transfusion raises the level of the red blood cells which carry oxygen, and this usually  causes an energy increase.  The nurse administering the transfusion will monitor you carefully for complications. HOME CARE INSTRUCTIONS  No special instructions are needed after a transfusion. You may find your energy is better. Speak with your caregiver about any limitations on activity for underlying  diseases you may have. SEEK MEDICAL CARE IF:   Your condition is not improving after your transfusion.  You develop redness or irritation at the intravenous (IV) site. SEEK IMMEDIATE MEDICAL CARE IF:  Any of the following symptoms occur over the next 12 hours:  Shaking chills.  You have a temperature by mouth above 102 F (38.9 C), not controlled by medicine.  Chest, back, or muscle pain.  People around you feel you are not acting correctly or are confused.  Shortness of breath or difficulty breathing.  Dizziness and fainting.  You get a rash or develop hives.  You have a decrease in urine output.  Your urine turns a dark color or changes to pink, red, or brown. Any of the following symptoms occur over the next 10 days:  You have a temperature by mouth above 102 F (38.9 C), not controlled by medicine.  Shortness of breath.  Weakness after normal activity.  The white part of the eye turns yellow (jaundice).  You have a decrease in the amount of urine or are urinating less often.  Your urine turns a dark color or changes to pink, red, or brown. Document Released: 01/04/2000 Document Revised: 03/31/2011 Document Reviewed: 08/23/2007 [facility] Patient Information 2014 [place], [place].

## 2012-10-21 NOTE — Telephone Encounter (Addendum)
Constipation and edema in hands and feet. Last [facility] Sunday 10/17/12. I instructed wife to get mag citrate for pt and for him  to take 1/2 bottle first. I will send note to Dr Arbutus Ped about edema. Note to Dr Arbutus Ped

## 2012-10-21 NOTE — Progress Notes (Signed)
Discharged with spouse, ambulatory in no distress. 

## 2012-10-21 NOTE — Progress Notes (Signed)
Tolerating blood at 185cc/hr with no signs of reaction

## 2012-10-22 ENCOUNTER — Other Ambulatory Visit: Payer: Self-pay | Admitting: *Deleted

## 2012-10-22 ENCOUNTER — Ambulatory Visit (HOSPITAL_BASED_OUTPATIENT_CLINIC_OR_DEPARTMENT_OTHER): Payer: Medicare Other

## 2012-10-22 VITALS — BP 131/72 | HR 60 | Temp 98.1°F | Resp 18

## 2012-10-22 DIAGNOSIS — D649 Anemia, unspecified: Secondary | ICD-10-CM

## 2012-10-22 DIAGNOSIS — Z5111 Encounter for antineoplastic chemotherapy: Secondary | ICD-10-CM

## 2012-10-22 DIAGNOSIS — C3491 Malignant neoplasm of unspecified part of right bronchus or lung: Secondary | ICD-10-CM

## 2012-10-22 DIAGNOSIS — D6481 Anemia due to antineoplastic chemotherapy: Secondary | ICD-10-CM

## 2012-10-22 DIAGNOSIS — C342 Malignant neoplasm of middle lobe, bronchus or lung: Secondary | ICD-10-CM

## 2012-10-22 MED ORDER — SODIUM CHLORIDE 0.9 % IV SOLN
Freq: Once | INTRAVENOUS | Status: AC
  Administered 2012-10-22: 13:00:00 via INTRAVENOUS

## 2012-10-22 MED ORDER — SODIUM CHLORIDE 0.9 % IJ SOLN
10.0000 mL | INTRAMUSCULAR | Status: DC | PRN
  Administered 2012-10-22: 10 mL
  Filled 2012-10-22: qty 10

## 2012-10-22 MED ORDER — SODIUM CHLORIDE 0.9 % IV SOLN
120.0000 mg/m2 | Freq: Once | INTRAVENOUS | Status: AC
  Administered 2012-10-22: 260 mg via INTRAVENOUS
  Filled 2012-10-22: qty 13

## 2012-10-22 MED ORDER — DEXAMETHASONE SODIUM PHOSPHATE 10 MG/ML IJ SOLN
INTRAMUSCULAR | Status: AC
  Filled 2012-10-22: qty 1

## 2012-10-22 MED ORDER — HEPARIN SOD (PORK) LOCK FLUSH 100 UNIT/ML IV SOLN
500.0000 [IU] | Freq: Once | INTRAVENOUS | Status: DC | PRN
  Filled 2012-10-22: qty 5

## 2012-10-22 MED ORDER — SODIUM CHLORIDE 0.9 % IV SOLN
250.0000 mL | Freq: Once | INTRAVENOUS | Status: AC
  Administered 2012-10-22: 250 mL via INTRAVENOUS

## 2012-10-22 MED ORDER — DEXAMETHASONE SODIUM PHOSPHATE 10 MG/ML IJ SOLN
10.0000 mg | Freq: Once | INTRAMUSCULAR | Status: AC
  Administered 2012-10-22: 10 mg via INTRAVENOUS

## 2012-10-22 MED ORDER — ONDANSETRON 8 MG/NS 50 ML IVPB
INTRAVENOUS | Status: AC
  Filled 2012-10-22: qty 8

## 2012-10-22 MED ORDER — ONDANSETRON 8 MG/50ML IVPB (CHCC)
8.0000 mg | Freq: Once | INTRAVENOUS | Status: AC
  Administered 2012-10-22: 8 mg via INTRAVENOUS

## 2012-10-22 MED ORDER — DIPHENHYDRAMINE HCL 25 MG PO CAPS: 25.0000 mg | ORAL_CAPSULE | Freq: Once | ORAL | Status: DC

## 2012-10-22 MED ORDER — ACETAMINOPHEN 325 MG PO TABS: 650.0000 mg | ORAL_TABLET | Freq: Once | ORAL | Status: DC

## 2012-10-22 MED ORDER — SODIUM CHLORIDE 0.9 % IJ SOLN
10.0000 mL | INTRAMUSCULAR | Status: DC | PRN
  Filled 2012-10-22: qty 10

## 2012-10-22 MED ORDER — HEPARIN SOD (PORK) LOCK FLUSH 100 UNIT/ML IV SOLN
500.0000 [IU] | Freq: Every day | INTRAVENOUS | Status: AC | PRN
  Administered 2012-10-22: 500 [IU]
  Filled 2012-10-22: qty 5

## 2012-10-22 NOTE — Patient Instructions (Addendum)
[facility] Discharge Instructions for Patients Receiving Chemotherapy  Today you received the following chemotherapy agents:  Etoposide   To help prevent nausea and vomiting after your treatment, we encourage you to take your nausea medication as ordered per MD.    Blood Transfusion  A blood transfusion replaces your blood or some of its parts. Blood is replaced when you have lost blood because of surgery, an accident, or for severe blood conditions like anemia. You can donate blood to be used on yourself if you have a planned surgery. If you lose blood during that surgery, your own blood can be given back to you. Any blood given to you is checked to make sure it matches your blood type. Your temperature, blood pressure, and heart rate (vital signs) will be checked often.  GET HELP RIGHT AWAY IF:   You feel sick to your stomach (nauseous) or throw up (vomit).  You have watery poop (diarrhea).  You have shortness of breath or trouble breathing.  You have blood in your pee (urine) or have dark colored pee.  You have chest pain or tightness.  Your eyes or skin turn yellow (jaundice).  You have a temperature by mouth above 102 F (38.9 C), not controlled by medicine.  You start to shake and have chills.  You develop a a red rash (hives) or feel itchy.  You develop lightheadedness or feel confused.  You develop back, joint, or muscle pain.  You do not feel hungry (lost appetite).  You feel tired, restless, or nervous.  You develop belly (abdominal) cramps. Document Released: 04/04/2008 Document Revised: 03/31/2011 Document Reviewed: 04/04/2008 [facility] Patient Information 2014 [place], [place].    If you develop nausea and vomiting that is not controlled by your nausea medication, call the clinic.   BELOW ARE SYMPTOMS THAT SHOULD BE REPORTED IMMEDIATELY:  *FEVER GREATER THAN 100.5 F  *CHILLS WITH OR WITHOUT FEVER  NAUSEA AND VOMITING THAT IS NOT  CONTROLLED WITH YOUR NAUSEA MEDICATION  *UNUSUAL SHORTNESS OF BREATH  *UNUSUAL BRUISING OR BLEEDING  TENDERNESS IN MOUTH AND THROAT WITH OR WITHOUT PRESENCE OF ULCERS  *URINARY PROBLEMS  *BOWEL PROBLEMS  UNUSUAL RASH Items with * indicate a potential emergency and should be followed up as soon as possible.  Feel free to call the clinic you have any questions or concerns. The clinic phone number is [phone].

## 2012-10-23 ENCOUNTER — Ambulatory Visit (HOSPITAL_BASED_OUTPATIENT_CLINIC_OR_DEPARTMENT_OTHER): Payer: Medicare Other

## 2012-10-23 VITALS — BP 130/71 | HR 71 | Temp 97.8°F

## 2012-10-23 DIAGNOSIS — C3491 Malignant neoplasm of unspecified part of right bronchus or lung: Secondary | ICD-10-CM

## 2012-10-23 DIAGNOSIS — C342 Malignant neoplasm of middle lobe, bronchus or lung: Secondary | ICD-10-CM

## 2012-10-23 DIAGNOSIS — Z5189 Encounter for other specified aftercare: Secondary | ICD-10-CM

## 2012-10-23 LAB — TYPE AND SCREEN
ABO/RH(D): A POS
Unit division: 0
Unit division: 0

## 2012-10-23 MED ORDER — PEGFILGRASTIM INJECTION 6 MG/0.6ML
6.0000 mg | Freq: Once | SUBCUTANEOUS | Status: AC
  Administered 2012-10-23: 6 mg via SUBCUTANEOUS

## 2012-10-23 NOTE — Patient Instructions (Signed)

## 2012-10-25 ENCOUNTER — Telehealth: Payer: Self-pay | Admitting: *Deleted

## 2012-10-25 DIAGNOSIS — C3490 Malignant neoplasm of unspecified part of unspecified bronchus or lung: Secondary | ICD-10-CM

## 2012-10-25 MED ORDER — FUROSEMIDE 20 MG PO TABS: 20.0000 mg | ORAL_TABLET | ORAL | Status: DC | PRN

## 2012-10-25 NOTE — Telephone Encounter (Signed)
Pt's wife called stating that pt has increased swelling in his feet and hands.  She is requesting for pt to have some lasix.  Per Dr Donnald Garre, pt may have lasix 20mg  as needed for swelling.  [name] also stated that pt was stumbling at church yesterday and has had some vision changes.  Per Dr Donnald Garre, okay to add CT of the head with contrast to the CT CAP he will be getting on 10/20.  SLJ

## 2012-10-27 ENCOUNTER — Other Ambulatory Visit (HOSPITAL_BASED_OUTPATIENT_CLINIC_OR_DEPARTMENT_OTHER): Payer: Medicare Other | Admitting: Lab

## 2012-10-27 ENCOUNTER — Other Ambulatory Visit: Payer: Self-pay | Admitting: *Deleted

## 2012-10-27 DIAGNOSIS — C3491 Malignant neoplasm of unspecified part of right bronchus or lung: Secondary | ICD-10-CM

## 2012-10-27 DIAGNOSIS — C3490 Malignant neoplasm of unspecified part of unspecified bronchus or lung: Secondary | ICD-10-CM

## 2012-10-27 LAB — CBC WITH DIFFERENTIAL/PLATELET
BASO%: 0.5 % (ref 0.0–2.0)
EOS%: 0.4 % (ref 0.0–7.0)
HCT: 26.8 % — ABNORMAL LOW (ref 38.4–49.9)
MCH: 29.7 pg (ref 27.2–33.4)
MCHC: 33.1 g/dL (ref 32.0–36.0)
MONO#: 0.3 10*3/uL (ref 0.1–0.9)
NEUT#: 5.6 10*3/uL (ref 1.5–6.5)
Platelets: 180 10*3/uL (ref 140–400)
RDW: 18.8 % — ABNORMAL HIGH (ref 11.0–14.6)
WBC: 6.3 10*3/uL (ref 4.0–10.3)
lymph#: 0.4 10*3/uL — ABNORMAL LOW (ref 0.9–3.3)

## 2012-10-27 LAB — COMPREHENSIVE METABOLIC PANEL (CC13)
ALT: 12 U/L (ref 0–55)
AST: 13 U/L (ref 5–34)
Albumin: 2.9 g/dL — ABNORMAL LOW (ref 3.5–5.0)
Alkaline Phosphatase: 134 U/L (ref 40–150)
Calcium: 8.9 mg/dL (ref 8.4–10.4)
Chloride: 108 mEq/L (ref 98–109)
Creatinine: 1 mg/dL (ref 0.7–1.3)
Potassium: 4 mEq/L (ref 3.5–5.1)
Sodium: 142 mEq/L (ref 136–145)
Total Protein: 6.6 g/dL (ref 6.4–8.3)

## 2012-10-27 MED ORDER — OXYCODONE-ACETAMINOPHEN 5-325 MG PO TABS: 1.0000 | ORAL_TABLET | ORAL | Status: DC | PRN

## 2012-11-03 ENCOUNTER — Other Ambulatory Visit (HOSPITAL_BASED_OUTPATIENT_CLINIC_OR_DEPARTMENT_OTHER): Payer: Medicare Other | Admitting: Lab

## 2012-11-03 DIAGNOSIS — C3490 Malignant neoplasm of unspecified part of unspecified bronchus or lung: Secondary | ICD-10-CM

## 2012-11-03 DIAGNOSIS — C3491 Malignant neoplasm of unspecified part of right bronchus or lung: Secondary | ICD-10-CM

## 2012-11-03 LAB — COMPREHENSIVE METABOLIC PANEL (CC13)
AST: 9 U/L (ref 5–34)
Albumin: 3 g/dL — ABNORMAL LOW (ref 3.5–5.0)
Alkaline Phosphatase: 139 U/L (ref 40–150)
BUN: 17.4 mg/dL (ref 7.0–26.0)
Creatinine: 1 mg/dL (ref 0.7–1.3)
Potassium: 3.7 mEq/L (ref 3.5–5.1)
Sodium: 142 mEq/L (ref 136–145)

## 2012-11-03 LAB — CBC WITH DIFFERENTIAL/PLATELET
Basophils Absolute: 0.1 10*3/uL (ref 0.0–0.1)
EOS%: 0.1 % (ref 0.0–7.0)
HCT: 26.8 % — ABNORMAL LOW (ref 38.4–49.9)
HGB: 8.7 g/dL — ABNORMAL LOW (ref 13.0–17.1)
MCH: 29.3 pg (ref 27.2–33.4)
MCHC: 32.5 g/dL (ref 32.0–36.0)
MCV: 90.2 fL (ref 79.3–98.0)
MONO%: 10.9 % (ref 0.0–14.0)
NEUT%: 82.3 % — ABNORMAL HIGH (ref 39.0–75.0)
RDW: 19.4 % — ABNORMAL HIGH (ref 11.0–14.6)

## 2012-11-08 ENCOUNTER — Telehealth: Payer: Self-pay | Admitting: *Deleted

## 2012-11-08 ENCOUNTER — Ambulatory Visit (HOSPITAL_COMMUNITY)
Admission: RE | Admit: 2012-11-08 | Discharge: 2012-11-08 | Disposition: A | Discharge status: Home / Self Care | Payer: Medicare Other | Source: Ambulatory Visit | Attending: Physician Assistant | Admitting: Physician Assistant

## 2012-11-08 ENCOUNTER — Other Ambulatory Visit: Payer: Self-pay | Admitting: Internal Medicine

## 2012-11-08 ENCOUNTER — Ambulatory Visit (HOSPITAL_BASED_OUTPATIENT_CLINIC_OR_DEPARTMENT_OTHER): Payer: Medicare Other

## 2012-11-08 ENCOUNTER — Encounter (HOSPITAL_COMMUNITY): Payer: Self-pay

## 2012-11-08 VITALS — BP 147/60 | HR 64 | Temp 98.5°F

## 2012-11-08 DIAGNOSIS — R911 Solitary pulmonary nodule: Secondary | ICD-10-CM | POA: Insufficient documentation

## 2012-11-08 DIAGNOSIS — N2889 Other specified disorders of kidney and ureter: Secondary | ICD-10-CM | POA: Insufficient documentation

## 2012-11-08 DIAGNOSIS — N400 Enlarged prostate without lower urinary tract symptoms: Secondary | ICD-10-CM | POA: Insufficient documentation

## 2012-11-08 DIAGNOSIS — Z452 Encounter for adjustment and management of vascular access device: Secondary | ICD-10-CM

## 2012-11-08 DIAGNOSIS — I7090 Unspecified atherosclerosis: Secondary | ICD-10-CM | POA: Insufficient documentation

## 2012-11-08 DIAGNOSIS — K7689 Other specified diseases of liver: Secondary | ICD-10-CM | POA: Insufficient documentation

## 2012-11-08 DIAGNOSIS — J910 Malignant pleural effusion: Secondary | ICD-10-CM | POA: Insufficient documentation

## 2012-11-08 DIAGNOSIS — C3491 Malignant neoplasm of unspecified part of right bronchus or lung: Secondary | ICD-10-CM

## 2012-11-08 DIAGNOSIS — C3490 Malignant neoplasm of unspecified part of unspecified bronchus or lung: Secondary | ICD-10-CM

## 2012-11-08 DIAGNOSIS — R269 Unspecified abnormalities of gait and mobility: Secondary | ICD-10-CM | POA: Insufficient documentation

## 2012-11-08 DIAGNOSIS — C342 Malignant neoplasm of middle lobe, bronchus or lung: Secondary | ICD-10-CM

## 2012-11-08 DIAGNOSIS — K5730 Diverticulosis of large intestine without perforation or abscess without bleeding: Secondary | ICD-10-CM | POA: Insufficient documentation

## 2012-11-08 MED ORDER — SODIUM CHLORIDE 0.9 % IJ SOLN
10.0000 mL | INTRAMUSCULAR | Status: DC | PRN
  Administered 2012-11-08: 10 mL via INTRAVENOUS
  Filled 2012-11-08: qty 10

## 2012-11-08 MED ORDER — IOHEXOL 300 MG/ML  SOLN
100.0000 mL | Freq: Once | INTRAMUSCULAR | Status: AC | PRN
  Administered 2012-11-08: 100 mL via INTRAVENOUS

## 2012-11-08 MED ORDER — HEPARIN SOD (PORK) LOCK FLUSH 100 UNIT/ML IV SOLN
500.0000 [IU] | Freq: Once | INTRAVENOUS | Status: AC
  Administered 2012-11-08: 500 [IU] via INTRAVENOUS
  Filled 2012-11-08: qty 5

## 2012-11-08 NOTE — Patient Instructions (Signed)
Implanted Port Instructions  An implanted port is a central line that has a round shape and is placed under the skin. It is used for long-term IV (intravenous) access for:  · Medicine.  · Fluids.  · Liquid nutrition, such as TPN (total parenteral nutrition).  · Blood samples.  Ports can be placed:  · In the chest area just below the collarbone (this is the most common place.)  · In the arms.  · In the belly (abdomen) area.  · In the legs.  PARTS OF THE PORT  A port has 2 main parts:  · The reservoir. The reservoir is round, disc-shaped, and will be a small, raised area under your skin.  · The reservoir is the part where a needle is inserted (accessed) to either give medicines or to draw blood.  · The catheter. The catheter is a long, slender tube that extends from the reservoir. The catheter is placed into a large vein.  · Medicine that is inserted into the reservoir goes into the catheter and then into the vein.  INSERTION OF THE PORT  · The port is surgically placed in either an operating room or in a procedural area (interventional radiology).  · Medicine may be given to help you relax during the procedure.  · The skin where the port will be inserted is numbed (local anesthetic).  · 1 or 2 small cuts (incisions) will be made in the skin to insert the port.  · The port can be used after it has been inserted.  INCISION SITE CARE  · The incision site may have small adhesive strips on it. This helps keep the incision site closed. Sometimes, no adhesive strips are placed. Instead of adhesive strips, a special kind of surgical glue is used to keep the incision closed.  · If adhesive strips were placed on the incision sites, do not take them off. They will fall off on their own.  · The incision site may be sore for 1 to 2 days. Pain medicine can help.  · Do not get the incision site wet. Bathe or shower as directed by your caregiver.  · The incision site should heal in 5 to 7 days. A small scar may form after the  incision has healed.  ACCESSING THE PORT  Special steps must be taken to access the port:  · Before the port is accessed, a numbing cream can be placed on the skin. This helps numb the skin over the port site.  · A sterile technique is used to access the port.  · The port is accessed with a needle. Only "non-coring" port needles should be used to access the port. Once the port is accessed, a blood return should be checked. This helps ensure the port is in the vein and is not clogged (clotted).  · If your caregiver believes your port should remain accessed, a clear (transparent) bandage will be placed over the needle site. The bandage and needle will need to be changed every week or as directed by your caregiver.  · Keep the bandage covering the needle clean and dry. Do not get it wet. Follow your caregiver's instructions on how to take a shower or bath when the port is accessed.  · If your port does not need to stay accessed, no bandage is needed over the port.  FLUSHING THE PORT  Flushing the port keeps it from getting clogged. How often the port is flushed depends on:  · If a   constant infusion is running. If a constant infusion is running, the port may not need to be flushed.  · If intermittent medicines are given.  · If the port is not being used.  For intermittent medicines:  · The port will need to be flushed:  · After medicines have been given.  · After blood has been drawn.  · As part of routine maintenance.  · A port is normally flushed with:  · Normal saline.  · Heparin.  · Follow your caregiver's advice on how often, how much, and the type of flush to use on your port.  IMPORTANT PORT INFORMATION  · Tell your caregiver if you are allergic to heparin.  · After your port is placed, you will get a manufacturer's information card. The card has information about your port. Keep this card with you at all times.  · There are many types of ports available. Know what kind of port you have.  · In case of an  emergency, it may be helpful to wear a medical alert bracelet. This can help alert health care workers that you have a port.  · The port can stay in for as long as your caregiver believes it is necessary.  · When it is time for the port to come out, surgery will be done to remove it. The surgery will be similar to how the port was put in.  · If you are in the hospital or clinic:  · Your port will be taken care of and flushed by a nurse.  · If you are at home:  · A home health care nurse may give medicines and take care of the port.  · You or a family member can get special training and directions for giving medicine and taking care of the port at home.  SEEK IMMEDIATE MEDICAL CARE IF:   · Your port does not flush or you are unable to get a blood return.  · New drainage or pus is coming from the incision.  · A bad smell is coming from the incision site.  · You develop swelling or increased redness at the incision site.  · You develop increased swelling or pain at the port site.  · You develop swelling or pain in the surrounding skin near the port.  · You have an oral temperature above 102° F (38.9° C), not controlled by medicine.  MAKE SURE YOU:   · Understand these instructions.  · Will watch your condition.  · Will get help right away if you are not doing well or get worse.  Document Released: 01/06/2005 Document Revised: 03/31/2011 Document Reviewed: 03/30/2008  [employer]® Patient Information ©2014 [employer].

## 2012-11-08 NOTE — Telephone Encounter (Signed)
Patient in CT department s/p scans does not want the port-a-cath de-accessed as he is to come to [facility] for treatment on 11-10-2012.  Informed Kim that per ONS and IV guidelines for patients receiving chemotherapy agents we must have a newly accessed device to start t he chemotherapy treatment on 11-10-2012.

## 2012-11-10 ENCOUNTER — Ambulatory Visit: Payer: Medicare HMO

## 2012-11-10 ENCOUNTER — Other Ambulatory Visit (HOSPITAL_BASED_OUTPATIENT_CLINIC_OR_DEPARTMENT_OTHER): Payer: Medicare Other | Admitting: Lab

## 2012-11-10 ENCOUNTER — Ambulatory Visit: Payer: Medicare Other

## 2012-11-10 ENCOUNTER — Telehealth: Payer: Self-pay | Admitting: Internal Medicine

## 2012-11-10 ENCOUNTER — Ambulatory Visit (HOSPITAL_BASED_OUTPATIENT_CLINIC_OR_DEPARTMENT_OTHER): Payer: Medicare Other | Admitting: Internal Medicine

## 2012-11-10 ENCOUNTER — Encounter: Payer: Self-pay | Admitting: Internal Medicine

## 2012-11-10 VITALS — BP 126/62 | HR 67 | Temp 98.2°F | Resp 20 | Ht 72.0 in | Wt 200.0 lb

## 2012-11-10 DIAGNOSIS — M549 Dorsalgia, unspecified: Secondary | ICD-10-CM

## 2012-11-10 DIAGNOSIS — C3491 Malignant neoplasm of unspecified part of right bronchus or lung: Secondary | ICD-10-CM

## 2012-11-10 DIAGNOSIS — C342 Malignant neoplasm of middle lobe, bronchus or lung: Secondary | ICD-10-CM

## 2012-11-10 DIAGNOSIS — C3490 Malignant neoplasm of unspecified part of unspecified bronchus or lung: Secondary | ICD-10-CM

## 2012-11-10 DIAGNOSIS — R079 Chest pain, unspecified: Secondary | ICD-10-CM

## 2012-11-10 LAB — CBC WITH DIFFERENTIAL/PLATELET
BASO%: 0.4 % (ref 0.0–2.0)
HCT: 27.4 % — ABNORMAL LOW (ref 38.4–49.9)
LYMPH%: 7.1 % — ABNORMAL LOW (ref 14.0–49.0)
MCH: 30 pg (ref 27.2–33.4)
MCHC: 33.1 g/dL (ref 32.0–36.0)
MCV: 90.6 fL (ref 79.3–98.0)
MONO#: 1.7 10*3/uL — ABNORMAL HIGH (ref 0.1–0.9)
MONO%: 17.1 % — ABNORMAL HIGH (ref 0.0–14.0)
NEUT%: 74.9 % (ref 39.0–75.0)
Platelets: 260 10*3/uL (ref 140–400)
RBC: 3.02 10*6/uL — ABNORMAL LOW (ref 4.20–5.82)
WBC: 9.9 10*3/uL (ref 4.0–10.3)

## 2012-11-10 LAB — COMPREHENSIVE METABOLIC PANEL (CC13)
ALT: 8 U/L (ref 0–55)
AST: 10 U/L (ref 5–34)
Alkaline Phosphatase: 126 U/L (ref 40–150)
Anion Gap: 8 mEq/L (ref 3–11)
CO2: 26 mEq/L (ref 22–29)
Calcium: 9.2 mg/dL (ref 8.4–10.4)
Creatinine: 1 mg/dL (ref 0.7–1.3)
Glucose: 110 mg/dl (ref 70–140)
Potassium: 4.1 mEq/L (ref 3.5–5.1)
Sodium: 140 mEq/L (ref 136–145)
Total Bilirubin: <0.2 mg/dL (ref 0.20–1.20)
Total Protein: 6.6 g/dL (ref 6.4–8.3)

## 2012-11-10 MED ORDER — HEPARIN SOD (PORK) LOCK FLUSH 100 UNIT/ML IV SOLN
500.0000 [IU] | Freq: Once | INTRAVENOUS | Status: AC
  Administered 2012-11-10: 500 [IU] via INTRAVENOUS
  Filled 2012-11-10: qty 5

## 2012-11-10 MED ORDER — OXYCODONE-ACETAMINOPHEN 5-325 MG PO TABS: 1.0000 | ORAL_TABLET | ORAL | Status: DC | PRN

## 2012-11-10 MED ORDER — SODIUM CHLORIDE 0.9 % IJ SOLN
10.0000 mL | INTRAMUSCULAR | Status: DC | PRN
  Administered 2012-11-10: 10 mL via INTRAVENOUS
  Filled 2012-11-10: qty 10

## 2012-11-10 MED ORDER — HEPARIN SOD (PORK) LOCK FLUSH 100 UNIT/ML IV SOLN
500.0000 [IU] | Freq: Once | INTRAVENOUS | Status: DC
  Filled 2012-11-10: qty 5

## 2012-11-10 MED ORDER — FUROSEMIDE 20 MG PO TABS: 20.0000 mg | ORAL_TABLET | ORAL | Status: DC | PRN

## 2012-11-10 MED ORDER — SODIUM CHLORIDE 0.9 % IJ SOLN
10.0000 mL | INTRAMUSCULAR | Status: DC | PRN
  Filled 2012-11-10: qty 10

## 2012-11-10 NOTE — Patient Instructions (Signed)
CURRENT THERAPY: Systemic chemotherapy with carboplatin for AUC of 5 on day 1 and etoposide 120 mg/M2 on days 1, 2 and 3 with Neulasta support on day 4, status post 4 cycles. First dose was given on 08/18/2012.  CHEMOTHERAPY INTENT: Palliative  CURRENT # OF CHEMOTHERAPY CYCLES: 4  CURRENT ANTIEMETICS: Zofran, dexamethasone and Compazine  CURRENT SMOKING STATUS: Never smoker  ORAL CHEMOTHERAPY AND CONSENT: None  CURRENT BISPHOSPHONATES USE: None  PAIN MANAGEMENT: 2/10, Tyleno#3 is changed to Percocet  NARCOTICS INDUCED CONSTIPATION: None  LIVING WILL AND CODE STATUS: Full code.  1) referred to radiation oncology. 2) referred to [facility] for a second opinion. 3) right sided thoracentesis. 4) followup visit in 3 months with repeat CT scan of the chest, abdomen and pelvis.

## 2012-11-10 NOTE — Telephone Encounter (Signed)
gv and printed appt sched and avs for pt for OCT and Jan ...sched pt para adn pt requests sedation..notified Jesusita Oka and Judeth Cornfield and they will contact pt.

## 2012-11-10 NOTE — Progress Notes (Signed)
[facility] Telephone:([phone]) [phone]   Fax:([phone]  OFFICE PROGRESS NOTE  Samuel Regulus, MD [address] [place] [address]  DIAGNOSIS AND STAGE: Extensive stage small cell lung cancer diagnosed in July 2014.   PRIOR THERAPY: None   CURRENT THERAPY: Systemic chemotherapy with carboplatin for AUC of 5 on day 1 and etoposide 120 mg/M2 on days 1, 2 and 3 with Neulasta support on day 4, status post 4 cycles. First dose was given on 08/18/2012.   CHEMOTHERAPY INTENT: Palliative  CURRENT # OF CHEMOTHERAPY CYCLES: 4 CURRENT ANTIEMETICS: Zofran, dexamethasone and Compazine  CURRENT SMOKING STATUS: Never smoker  ORAL CHEMOTHERAPY AND CONSENT: None  CURRENT BISPHOSPHONATES USE: None  PAIN MANAGEMENT: 2/10, Tyleno#3 is changed to Percocet  NARCOTICS INDUCED CONSTIPATION: None  LIVING WILL AND CODE STATUS: Full code.  INTERVAL HISTORY: Samuel Hurley 78 y.o. male returns to the clinic today for followup visit accompanied by his wife Samuel Needle. The patient tolerated the last cycle of his chemotherapy was carboplatin and etoposide fairly well except for increasing fatigue and weakness. He also continues to have pain on the right side of the chest and lower back. He is currently on Percocet 5/325 every 6 hours as needed for pain and he also takes Tylenol 500 mg by mouth 3 times a day. He was started on treatment with fentanyl patch 25 mcg/hour every 3 days but he discontinued it after 1 day because he didn't think it is working. He also complains of swelling of the lower extremity but that improved after he was started treatment with Lasix and he requested refill Lasix. The patient denied having any significant fever or chills. He denied having any nausea or vomiting. The patient had repeat CT scan of the head, chest, abdomen and pelvis performed recently and he is here for evaluation and discussion of his scan results.  MEDICAL HISTORY: Past Medical History    Diagnosis Date  . Hyperlipidemia   . OSA (obstructive sleep apnea)   . BPH (benign prostatic hypertrophy)   . Kidney disease     TCC  . Xerostomia   . Pharynx cancer     squamous cell stage 4,s/p XRT,chemo, neck dissection  . Colon polyp     HYPERPLASTIC & TUBULAR ADENOMA(Colonoscopy-Dr.[place])  . Diverticulosis of colon (without mention of hemorrhage)     (Colonoscopy-Dr.Kapowsin)  . Internal hemorrhoids without mention of complication     (Colonoscopy-Dr.)  . GERD (gastroesophageal reflux disease)     (EGD-Dr. Corinda Gubler)  . Atrophic gastritis without mention of hemorrhage     (EGD-Dr. Corinda Gubler)    ALLERGIES:  is allergic to erythromycin and tetracycline.  MEDICATIONS:  Current Outpatient Prescriptions  Medication Sig Dispense Refill  . Alum & Mag Hydroxide-Simeth (MAGIC MOUTHWASH) SOLN Take 5 mLs by mouth 4 (four) times daily as needed.  240 mL  0  . benzonatate (TESSALON) 200 MG capsule Take 200 mg by mouth 3 (three) times daily as needed for cough.      . chlorhexidine (PERIDEX) 0.12 % solution Use as directed 15 mLs in the mouth or throat 2 (two) times daily. 1/2 oz rinse and spit.      . cyanocobalamin 2000 MCG tablet Take 2,000 mcg by mouth daily.        Marland Kitchen dexlansoprazole (DEXILANT) 60 MG capsule Take 1 capsule (60 mg total) by mouth daily.  5 capsule  0  . dextromethorphan-guaiFENesin (MUCINEX DM) 30-600 MG per 12 hr tablet Take  1 tablet by mouth every 12 (twelve) hours.      . fentaNYL (DURAGESIC) 12 MCG/HR Place 1 patch (12.5 mcg total) onto the skin every 3 (three) days.  10 patch  0  . finasteride (PROSCAR) 5 MG tablet Take 5 mg by mouth daily.        . furosemide (LASIX) 20 MG tablet Take 1 tablet (20 mg total) by mouth as needed. 20mg  as needed for swelling.  10 tablet  0  . levothyroxine (SYNTHROID, LEVOTHROID) 75 MCG tablet Take 75 mcg by mouth daily.        Marland Kitchen lidocaine-prilocaine (EMLA) cream Apply topically as needed.  30 g  0  . LORazepam (ATIVAN) 0.5 MG  tablet Take 1 tablet (0.5 mg total) by mouth at bedtime as needed (insomnia).  30 tablet  0  . LORazepam (ATIVAN) 1 MG tablet 1 tablet by mouth once for MRI  2 tablet  0  . oxyCODONE-acetaminophen (PERCOCET/ROXICET) 5-325 MG per tablet Take 1 tablet by mouth every 4 (four) hours as needed for pain.  60 tablet  0  . prochlorperazine (COMPAZINE) 10 MG tablet Take 1 tablet (10 mg total) by mouth every 6 (six) hours as needed.  60 tablet  0  . terazosin (HYTRIN) 1 MG capsule Take 1 mg by mouth 2 (two) times daily.        Current Facility-Administered Medications  Medication Dose Route Frequency Provider Last Rate Last Dose  . TDaP (BOOSTRIX) injection 0.5 mL  0.5 mL Intramuscular Once Jacques Navy, MD       Facility-Administered Medications Ordered in Other Visits  Medication Dose Route Frequency Provider Last Rate Last Dose  . heparin lock flush 100 unit/mL  500 Units Intravenous Once Si Gaul, MD      . sodium chloride 0.9 % injection 10 mL  10 mL Intravenous PRN Si Gaul, MD        SURGICAL HISTORY:  Past Surgical History  Procedure Laterality Date  . Appendectomy    . Lumbar laminectomy    . Tonsillectomy    . Bilateral ganglionectomies    . Radical right neck dissection  2008  . Pci-rfa renal cell (tcc) cancer  2009    pt denies  . Cholecystectomy  dec. 2009  . Septoplasty  03/01/12    with double turbinectomy  . Video bronchoscopy Bilateral 08/10/2012    Procedure: VIDEO BRONCHOSCOPY WITH FLUORO;  Surgeon: Oretha Milch, MD;  Location: [facility] ENDOSCOPY;  Service: Cardiopulmonary;  Laterality: Bilateral;    REVIEW OF SYSTEMS:  Constitutional: positive for fatigue and weight loss Eyes: negative Ears, nose, mouth, throat, and face: negative Respiratory: positive for dyspnea on exertion and pleurisy/chest pain Cardiovascular: negative Gastrointestinal: negative Genitourinary:negative Integument/breast: negative Hematologic/lymphatic:  negative Musculoskeletal:positive for back pain Neurological: negative Behavioral/Psych: negative Endocrine: negative Allergic/Immunologic: negative   PHYSICAL EXAMINATION: General appearance: alert, cooperative, fatigued and no distress Head: Normocephalic, without obvious abnormality, atraumatic Neck: no adenopathy, no JVD, supple, symmetrical, trachea midline and thyroid not enlarged, symmetric, no tenderness/mass/nodules Lymph nodes: Cervical, supraclavicular, and axillary nodes normal. Resp: diminished breath sounds RLL and dullness to percussion RLL Back: symmetric, no curvature. ROM normal. No CVA tenderness. Cardio: regular rate and rhythm, S1, S2 normal, no murmur, click, rub or gallop GI: soft, non-tender; bowel sounds normal; no masses,  no organomegaly Extremities: extremities normal, atraumatic, no cyanosis or edema Neurologic: Alert and oriented X 3, normal strength and tone. Normal symmetric reflexes. Normal coordination and gait  ECOG PERFORMANCE STATUS:  1 - Symptomatic but completely ambulatory  Blood pressure 126/62, pulse 67, temperature 98.2 F (36.8 C), temperature source Oral, resp. rate 20, height 6' (1.829 m), weight 200 lb (90.719 kg).  LABORATORY DATA: Lab Results  Component Value Date   WBC 9.9 11/10/2012   HGB 9.1* 11/10/2012   HCT 27.4* 11/10/2012   MCV 90.6 11/10/2012   PLT 260 11/10/2012      Chemistry      Component Value Date/Time   NA 142 11/03/2012 1347   NA 138 05/13/2012 1005   K 3.7 11/03/2012 1347   K 3.7 05/13/2012 1005   CL 101 05/13/2012 1005   CO2 26 11/03/2012 1347   CO2 30 05/13/2012 1005   BUN 17.4 11/03/2012 1347   BUN 12 05/13/2012 1005   CREATININE 1.0 11/03/2012 1347   CREATININE 1.0 05/13/2012 1005      Component Value Date/Time   CALCIUM 8.9 11/03/2012 1347   CALCIUM 8.7 05/13/2012 1005   ALKPHOS 139 11/03/2012 1347   ALKPHOS 76 05/13/2012 1005   AST 9 11/03/2012 1347   AST 16 05/13/2012 1005   ALT 10 11/03/2012 1347    ALT 11 05/13/2012 1005   BILITOT <0.20 11/03/2012 1347   BILITOT 0.5 05/13/2012 1005       RADIOGRAPHIC STUDIES: Ct Head W Wo Contrast  11/08/2012   CLINICAL DATA:  Small cell lung cancer. Visual changes with loss of balance.  EXAM: CT HEAD WITHOUT AND WITH CONTRAST  TECHNIQUE: Contiguous axial images were obtained from the base of the skull through the vertex without and with intravenous contrast  CONTRAST:  OMNIPAQUE IOHEXOL 300 MG/ML  SOLN  COMPARISON:  Head CT 08/17/2012. PET-CT 08/06/2012.  FINDINGS: There is subtle increased density in the subarachnoid space over the right frontal lobe on images 21 through 27. There is no abnormal enhancement in this area, although the cortical vessels appear medially displaced compared with the prior study, suggesting a subdural process. There is no mass effect on the cortex or midline shift. There is no evidence of acute intracranial hemorrhage, mass lesion or abnormal intraparenchymal enhancement. There is no hydrocephalus.  The visualized paranasal sinuses are clear aside from chronic ethmoid sinus mucosal thickening. The mastoid air cells and middle ears are clear. The calvarium is intact.  IMPRESSION: New probable right frontal subdural process with minimally increased density, no abnormal enhancement and no significant localized mass effect. This may reflect a subacute or chronic subdural hematoma. The lack of enhancement makes metastatic disease unlikely, and there is no evidence of acute intracranial hemorrhage. Leptomeningeal disease can be subtle by CT, and if that is a clinical concern, further evaluation with MRI or lumbar puncture should be considered.   Electronically Signed   By: Roxy Horseman M.D.   On: 11/08/2012 13:18   Ct Chest W Contrast  11/08/2012   CLINICAL DATA:  History a small cell lung cancer. Restaging scan.  EXAM: CT CHEST, ABDOMEN, AND PELVIS WITH CONTRAST  TECHNIQUE: Multidetector CT imaging of the chest, abdomen and pelvis was  performed following the standard protocol during bolus administration of intravenous contrast.  CONTRAST:  OMNIPAQUE IOHEXOL 300 MG/ML  SOLN  COMPARISON:  CT of the chest, abdomen and pelvis 09/27/2012.  FINDINGS: CT CHEST FINDINGS  Mediastinum: Heart size is normal. There is no significant pericardial fluid, thickening or pericardial calcification. No pathologically enlarged mediastinal or hilar lymph nodes. Esophagus is unremarkable in appearance. Left-sided internal jugular single-lumen porta cath with tip terminating in the  mid superior vena cava.  Lungs/Pleura: Extensive nodular pleural thickening is again noted throughout the right hemithorax, in association with a large right pleural effusion, presumably a malignant pleural effusion. There is a pleural based mass like opacity in the inferior aspect of the right lower lobe, similar to numerous prior examinations, favored to represent an area of rounded atelectasis. In addition, there is a 6.4 x 2.4 cm mass centered in the right middle lobe, slightly larger than the prior examination (image 39 of series 5). Previously noted 7 mm left lower lobe nodule is also unchanged (image 39 of series 7). No definite new pulmonary nodules are otherwise identified. No acute consolidative airspace disease.  Musculoskeletal: There are no aggressive appearing lytic or blastic lesions noted in the visualized portions of the skeleton.  CT ABDOMEN AND PELVIS FINDINGS  Abdomen/Pelvis: Again noted are numerous low-attenuation hepatic lesions which appear well-defined and uniformly low attenuation. The largest of these are all compatible with simple cysts, while the smaller lesions are too small to definitively characterize. The largest single hepatic lesion is in central aspects of segments 7 and 8 measuring up to 8.6 x 6.7 cm. Status post cholecystectomy. The appearance of the pancreas, spleen and bilateral adrenal glands is unremarkable. Mild renal atrophy bilaterally. In  the lateral aspect of the interpolar region of the left kidney there is a focal area of architectural distortion and calcifications, which appears to represent an old radiofrequency ablation defect.  Extensive atherosclerosis throughout the abdominal and pelvic vasculature, without definite aneurysm or dissection. No significant volume of ascites. No pneumoperitoneum. No pathologic distention of small bowel. Extensive colonic diverticulosis without findings to suggest acute diverticulitis at this time. No definite lymphadenopathy identified within the abdomen or pelvis. Prostate gland is severely enlarged measuring up to 6.5 x 5.2 cm. Urinary bladder is unremarkable in appearance.  Musculoskeletal: There are no aggressive appearing lytic or blastic lesions noted in the visualized portions of the skeleton.    IMPRESSION: 1. Slight interval increase in size of right middle lobe mass. Right-sided malignant pleural effusion is very similar in a appearance to the prior examination. 2. Probable round atelectasis in the right lower lobe is unchanged. 3. 7 mm left lower lobe nodule is unchanged and remains nonspecific. 4. No definite signs of metastatic disease to the abdomen or pelvis. 5. Numerous low-attenuation hepatic lesions appears similar to prior examinations. The largest of these are all compatible with simple cysts, while the smaller lesions are too small to definitively characterize. 6. Small calcified area in the lateral aspect of the interpolar region of the left kidney appears to represent scarring at site of old radiofrequency ablation, particularly when compared with remote prior examinations dating back to 12/17/2007. 7. Colonic diverticulosis without findings to suggest acute diverticulitis at this time.   Electronically Signed   By: Trudie Reed M.D.   On: 11/08/2012 13:42   ASSESSMENT AND PLAN: This is a very pleasant 78 years old white male with history of extensive stage small cell lung cancer  status post first cycle of systemic chemotherapy with carboplatin and etoposide with mild initial response and now with stable disease after cycle #4. The patient continues to complain of pain on the right side of the chest likely secondary to the right pleural effusion as well as the transpleural tumor spread. I have a lengthy discussion with the patient and his wife today about his current disease condition and treatment options. In the absence of any further regression of his disease, I  recommended for the patient to discontinue his chemotherapy with carboplatin and etoposide at this point and he would be monitored closely with repeat CT scan of the chest, abdomen and pelvis in 3 months. I also gave the patient the option of seeking a second opinion regarding any available clinical trial for his disease and he is interested to go to [facility]. I will arrange an appointment for him with Dr. Okey Dupre or one of his associates for evaluation of his disease. I will also arrange for the patient to have repeat right-sided thoracenteses and I will send the pleural fluid for cytology to evaluation to rule out any other malignancy or component of non-small cell lung cancer especially with his nonsmoking history. i also discussed with the patient referral to radiation oncology for consideration of palliative radiotherapy to the right pleural based lung nodules for management of his right-sided chest pain. For pain management, the patient was given a refill of Percocet and he was advised to decrease the dose of Tylenol as much as he can. He would come back for followup visit in 3 months with repeat CT scan of the chest, abdomen and pelvis. CT scan of the head showed a questionable new right frontal subdural hematoma but this looks subacute/chronic in nature and the patient has no current symptoms. I recommended for him to continue on observation for now but he may need neurosurgical  evaluation if he develops any symptoms. The patient and his wife agreed to the current plan. He was advised to call immediately if he has any concerning symptoms in the interval. The patient voices understanding of current disease status and treatment options and is in agreement with the current care plan.  All questions were answered. The patient knows to call the clinic with any problems, questions or concerns. We can certainly see the patient much sooner if necessary.  I spent 35 minutes counseling the patient face to face. The total time spent in the appointment was 45 minutes.

## 2012-11-11 ENCOUNTER — Ambulatory Visit: Payer: Medicare Other

## 2012-11-11 ENCOUNTER — Telehealth: Payer: Self-pay | Admitting: *Deleted

## 2012-11-11 ENCOUNTER — Telehealth: Payer: Self-pay | Admitting: Internal Medicine

## 2012-11-11 NOTE — Telephone Encounter (Signed)
Pt appt. With Dr. Okey Dupre @ [facility] is 11/23/12@9 :30. Medical records faxed. Pt is aware

## 2012-11-11 NOTE — Telephone Encounter (Signed)
Pt's wife called wanting to know whether pt may take oxycodone after he begins using the fentanyl patch.  Informed her that the oxycodone may continued to be used as a breakthrough medication.  She also asked if pt could be evaluated as to whether he needs oxygen or not.  She feels he intermittently becomes SOB.  Per Dr Donnald Garre, okay for pt to get evaluated in the office.  Pt will come tomorrow afternoon and will have pulse O2 read.  SLJ

## 2012-11-12 ENCOUNTER — Ambulatory Visit (HOSPITAL_COMMUNITY)
Admission: RE | Admit: 2012-11-12 | Discharge: 2012-11-12 | Disposition: A | Discharge status: Home / Self Care | Payer: Medicare Other | Source: Ambulatory Visit | Attending: Internal Medicine | Admitting: Internal Medicine

## 2012-11-12 ENCOUNTER — Ambulatory Visit: Payer: Medicare Other

## 2012-11-12 ENCOUNTER — Ambulatory Visit (HOSPITAL_COMMUNITY)
Admission: RE | Admit: 2012-11-12 | Discharge: 2012-11-12 | Disposition: A | Discharge status: Home / Self Care | Payer: Medicare Other | Source: Ambulatory Visit | Attending: Radiology | Admitting: Radiology

## 2012-11-12 DIAGNOSIS — J90 Pleural effusion, not elsewhere classified: Secondary | ICD-10-CM | POA: Insufficient documentation

## 2012-11-12 DIAGNOSIS — C3490 Malignant neoplasm of unspecified part of unspecified bronchus or lung: Secondary | ICD-10-CM | POA: Insufficient documentation

## 2012-11-12 DIAGNOSIS — C3491 Malignant neoplasm of unspecified part of right bronchus or lung: Secondary | ICD-10-CM

## 2012-11-12 NOTE — Procedures (Signed)
US guided diagnostic/therapeutic right thoracentesis performed yielding 500 cc's yellow/amber fluid. The fluid was sent to the lab for cytology. F/u CXR pending. No immediate complications.

## 2012-11-13 ENCOUNTER — Ambulatory Visit: Payer: Medicare Other

## 2012-11-15 ENCOUNTER — Encounter: Payer: Self-pay | Admitting: Radiation Oncology

## 2012-11-15 NOTE — Progress Notes (Signed)
Thoracic Location of Tumor / Histology:  Small cell lung cancer - right lung  Patient presented, Dr. Shirline Frees "The patient mentions that 15 months ago he was not feeling good and started having more shortness of breath. He was seen by 7 physician in the past and was asking for repeat PET scan which was not performed. Recently his shortness breath and was getting worse and the patient had chest x-ray on 07/22/2012 and it showed moderate sized right pleural effusion with volume loss on the right."  Biopsies of right middle lobe (if applicable) revealed:    Lung, transbronchial biopsy, Right middle lobe - POSITIVE FOR SMALL CELL CARCINOMA. - SEE COMMENT.  Tobacco/Marijuana/Snuff/ETOH use: none  Past/Anticipated interventions by cardiothoracic surgery, if any: 08/10/2012 Procedure: VIDEO BRONCHOSCOPY WITH FLUORO;  Surgeon: Oretha Milch, MD;  Location: [facility] ENDOSCOPY;  Service: Cardiopulmonary;  Laterality: Bilateral;   Past/Anticipated interventions by medical oncology, if any: Systemic chemotherapy with carboplatin for AUC of 5 on day 1 and etoposide 120 mg/M2 on days 1, 2 and 3 with Neulasta support on day 4, status post 4 cycles. First dose was given on 08/18/2012.    Signs/Symptoms  Weight changes, if any: lost 37 lbs in 6 months  Respiratory complaints, if any: shortness of breath that has gotten worse over last 4-5 days, not coughing  Hemoptysis, if any: none  Pain issues, if any:  Has pain in lower back and chest.  Rating at a 4/10.  Has a fentanyl patch and takes 3-4 percocet per day.  SAFETY ISSUES:  Prior radiation? Yes - at [facility] 2009  Pacemaker/ICD? no  Possible current pregnancy? no  Is the patient on methotrexate? no  Current Complaints / other details:  Had oropharyngeal carcinoma diagnosed in 2008 status post surgical resection with radical right neck lymph node dissection. This was followed by concurrent chemoradiation at [facility].  Reports a sore and swollen throat.  Has to breath through his mouth because of chronic sinusitis.  Was very short of breath last night and almost went to the ER.

## 2012-11-17 ENCOUNTER — Ambulatory Visit
Admission: RE | Admit: 2012-11-17 | Discharge: 2012-11-17 | Disposition: A | Discharge status: Home / Self Care | Payer: Medicare Other | Source: Ambulatory Visit | Attending: Radiation Oncology | Admitting: Radiation Oncology

## 2012-11-17 ENCOUNTER — Encounter: Payer: Self-pay | Admitting: Radiation Oncology

## 2012-11-17 ENCOUNTER — Other Ambulatory Visit: Payer: Medicare Other | Admitting: Lab

## 2012-11-17 VITALS — BP 139/57 | HR 83 | Temp 98.5°F | Ht 72.0 in | Wt 200.2 lb

## 2012-11-17 DIAGNOSIS — C3490 Malignant neoplasm of unspecified part of unspecified bronchus or lung: Secondary | ICD-10-CM

## 2012-11-17 DIAGNOSIS — C3491 Malignant neoplasm of unspecified part of right bronchus or lung: Secondary | ICD-10-CM

## 2012-11-17 DIAGNOSIS — R0602 Shortness of breath: Secondary | ICD-10-CM | POA: Insufficient documentation

## 2012-11-17 DIAGNOSIS — Z9221 Personal history of antineoplastic chemotherapy: Secondary | ICD-10-CM | POA: Insufficient documentation

## 2012-11-17 DIAGNOSIS — R071 Chest pain on breathing: Secondary | ICD-10-CM | POA: Insufficient documentation

## 2012-11-17 DIAGNOSIS — M545 Low back pain, unspecified: Secondary | ICD-10-CM | POA: Insufficient documentation

## 2012-11-17 DIAGNOSIS — Z923 Personal history of irradiation: Secondary | ICD-10-CM | POA: Insufficient documentation

## 2012-11-17 DIAGNOSIS — Z85810 Personal history of malignant neoplasm of tongue: Secondary | ICD-10-CM | POA: Insufficient documentation

## 2012-11-17 HISTORY — DX: Personal history of irradiation: Z92.3

## 2012-11-17 NOTE — Progress Notes (Signed)
Radiation Oncology         ([phone]) [phone] ________________________________  Initial outpatient Consultation  Name: Jequan Shahin MRN: [id number]  Date: 11/17/2012  DOB: 29-Dec-1934  WU:[id number] Norins, MD  Si Gaul, MD   REFERRING PHYSICIAN: Si Gaul, MD  DIAGNOSIS: Extensive stage small cell lung cancer  HISTORY OF PRESENT ILLNESS::Farah C Jaxxen Voong is a 78 y.o. male who is seen out of the courtesy of Dr. Arbutus Ped for an opinion concerning radiation therapy as part of management of patient's advanced disease. Patient was diagnosed earlier this summer with extensive stage small cell lung cancer. He proceeded with 4 cycles of carboplatin and etoposide. He initially had a mild response but on recent imaging has had slight progression of his right hilar mass. Patient continues to have pain along the right posterior chest and lower back area. Chemotherapy is been stopped in light of minimal effect on his tumor. Radiation therapy is been consulted for consideration for palliative treatment.Marland Kitchen  PREVIOUS RADIATION THERAPY: Yes the patient received radiation treatments to the head and neck area for management of base of tongue carcinoma. He was treated at [facility]. Details concerning his management pending at this time.  He also underwent a right neck dissection as part of his management.  PAST MEDICAL HISTORY:  has a past medical history of Hyperlipidemia; OSA (obstructive sleep apnea); BPH (benign prostatic hypertrophy); Kidney disease; Xerostomia; Pharynx cancer; Colon polyp; Diverticulosis of colon (without mention of hemorrhage); Internal hemorrhoids without mention of complication; GERD (gastroesophageal reflux disease); Atrophic gastritis without mention of hemorrhage; and History of radiation therapy.    PAST SURGICAL HISTORY: Past Surgical History  Procedure Laterality Date  . Appendectomy    . Lumbar laminectomy    . Tonsillectomy    . Bilateral  ganglionectomies    . Radical right neck dissection  2008  . Pci-rfa renal cell (tcc) cancer  2009    pt denies  . Cholecystectomy  dec. 2009  . Septoplasty  03/01/12    with double turbinectomy  . Video bronchoscopy Bilateral 08/10/2012    Procedure: VIDEO BRONCHOSCOPY WITH FLUORO;  Surgeon: Oretha Milch, MD;  Location: [facility] ENDOSCOPY;  Service: Cardiopulmonary;  Laterality: Bilateral;  . Shoulder arthroscopy Left 2013  . Thoracentesis Right 10/2012    FAMILY HISTORY: family history includes Heart failure in his father and mother; Kidney failure in his father; Other in his father and mother. There is no history of Colon cancer, Prostate cancer, or Diabetes.  SOCIAL HISTORY:  reports that he has never smoked. He has never used smokeless tobacco. He reports that he does not drink alcohol or use illicit drugs.  ALLERGIES: Erythromycin and Tetracycline  MEDICATIONS:  Current Outpatient Prescriptions  Medication Sig Dispense Refill  . acetaminophen (TYLENOL) 500 MG tablet Take 500 mg by mouth every 6 (six) hours as needed for pain.      Marland Kitchen dexlansoprazole (DEXILANT) 60 MG capsule Take 1 capsule (60 mg total) by mouth daily.  5 capsule  0  . fentaNYL (DURAGESIC) 12 MCG/HR Place 1 patch (12.5 mcg total) onto the skin every 3 (three) days.  10 patch  0  . finasteride (PROSCAR) 5 MG tablet Take 5 mg by mouth daily.        . furosemide (LASIX) 20 MG tablet Take 1 tablet (20 mg total) by mouth as needed. 20mg  as needed for swelling.  30 tablet  0  . levothyroxine (SYNTHROID, LEVOTHROID) 75 MCG tablet Take 75 mcg by mouth daily.        Marland Kitchen  lidocaine-prilocaine (EMLA) cream Apply topically as needed.  30 g  0  . oxyCODONE-acetaminophen (PERCOCET/ROXICET) 5-325 MG per tablet Take 1 tablet by mouth every 4 (four) hours as needed for pain.  60 tablet  0  . terazosin (HYTRIN) 1 MG capsule Take 1 mg by mouth 2 (two) times daily.       . Alum & Mag Hydroxide-Simeth (MAGIC MOUTHWASH) SOLN Take 5 mLs by mouth 4  (four) times daily as needed.  240 mL  0  . cyanocobalamin 2000 MCG tablet Take 2,000 mcg by mouth daily.        Marland Kitchen dextromethorphan-guaiFENesin (MUCINEX DM) 30-600 MG per 12 hr tablet Take 1 tablet by mouth every 12 (twelve) hours.      Marland Kitchen LORazepam (ATIVAN) 0.5 MG tablet Take 1 tablet (0.5 mg total) by mouth at bedtime as needed (insomnia).  30 tablet  0  . LORazepam (ATIVAN) 1 MG tablet 1 tablet by mouth once for MRI  2 tablet  0  . prochlorperazine (COMPAZINE) 10 MG tablet Take 1 tablet (10 mg total) by mouth every 6 (six) hours as needed.  60 tablet  0   Current Facility-Administered Medications  Medication Dose Route Frequency Provider Last Rate Last Dose  . TDaP (BOOSTRIX) injection 0.5 mL  0.5 mL Intramuscular Once Jacques Navy, MD        REVIEW OF SYSTEMS:  A 15 point review of systems is documented in the electronic medical record. This was obtained by the nursing staff. However, I reviewed this with the patient to discuss relevant findings and make appropriate changes.  Right posterior wall chest pain. Is also complaining of pain in the lower lumbar spine area, just right of midline.  He denies any headaches or blurred vision. Patient does complain of shortness of breath and is anxious concerning this issue.   PHYSICAL EXAM:  height is 6' (1.829 m) and weight is 200 lb 3.2 oz (90.81 kg). His temperature is 98.5 F (36.9 C). His blood pressure is 139/57 and his pulse is 83. His oxygen saturation is 97%.   BP 139/57  Pulse 83  Temp(Src) 98.5 F (36.9 C)  Ht 6' (1.829 m)  Wt 200 lb 3.2 oz (90.81 kg)  BMI 27.15 kg/m2  SpO2 97%  General Appearance:    Alert, cooperative, no distress, appears stated age,  accompanied by his wife on evaluation today   Head:    Normocephalic, without obvious abnormality, atraumatic  Eyes:    PERRL, conjunctiva/corneas clear, EOM's intact,              Nose:   Nares normal, septum midline, mucosa normal, no drainage    or sinus tenderness  Throat:    Lips, mucosa, and oral tongue normal; gums normal, radiation changes noted in the soft palate region, no mucosal lesions noted in the oral cavity or posterior pharynx   Neck:   Supple, symmetrical, trachea midline, no adenopathy;       Right neck dissection with architectural distortion     Back:     Symmetric, no curvature, ROM normal, no CVA tenderness,  tenderness with palpation along the right lower lumbar spine   Lungs:     mildly decreased breath sounds along the right lung field respirations unlabored  Chest wall:    No tenderness or deformity  Heart:    Regular rate and rhythm, S1 and S2 normal, no murmur, rub   or gallop  Abdomen:     Soft, non-tender,  bowel sounds active all four quadrants,    no masses, no organomegaly        Extremities:   Extremities normal, atraumatic, no cyanosis or edema  Pulses:   2+ and symmetric all extremities  Skin:   Skin color, texture, turgor normal, no rashes or lesions  Lymph nodes:   Cervical, supraclavicular, and axillary nodes normal  Neurologic:    Normal strength,        ECOG = 2  0 - Asymptomatic (Fully active, able to carry on all predisease activities without restriction)  1 - Symptomatic but completely ambulatory (Restricted in physically strenuous activity but ambulatory and able to carry out work of a light or sedentary nature. For example, light housework, office work)  2 - Symptomatic, <50% in bed during the day (Ambulatory and capable of all self care but unable to carry out any work activities. Up and about more than 50% of waking hours)  3 - Symptomatic, >50% in bed, but not bedbound (Capable of only limited self-care, confined to bed or chair 50% or more of waking hours)  4 - Bedbound (Completely disabled. Cannot carry on any self-care. Totally confined to bed or chair)  5 - Death   Santiago Glad MM, Creech RH, Tormey DC, et al. [phone]). "Toxicity and response criteria of the [facility]". Am. Evlyn Clines. Oncol. 5  (6): 649-55  LABORATORY DATA:  Lab Results  Component Value Date   WBC 9.9 11/10/2012   HGB 9.1* 11/10/2012   HCT 27.4* 11/10/2012   MCV 90.6 11/10/2012   PLT 260 11/10/2012   Lab Results  Component Value Date   NA 140 11/10/2012   K 4.1 11/10/2012   CL 101 05/13/2012   CO2 26 11/10/2012   Lab Results  Component Value Date   ALT 8 11/10/2012   AST 10 11/10/2012   ALKPHOS 126 11/10/2012   BILITOT <0.20 11/10/2012     RADIOGRAPHY: Dg Chest 1 View  11/12/2012   CLINICAL DATA:  Status post right thoracentesis  EXAM: CHEST - 1 VIEW  COMPARISON:  Prior chest x-ray 09/30/2012; prior chest CT 11/08/2012  FINDINGS: No evidence of a pneumothorax. Trace a residual right pleural effusion versus pleural thickening and incompletely expanded right middle and lower lobes. Left subclavian approach a single-lumen power injectable port catheter. The catheter tip is in the mid SVC. Cardiac and mediastinal contours are unchanged. No acute osseous abnormality.  IMPRESSION: No evidence of pneumothorax status post right thoracentesis.  Residual pleural fluid versus pleural thickening combined with known right middle lobe mass and chronic atelectasis and bronchiectasis in the right lower lobe.   Electronically Signed   By: Malachy Moan M.D.   On: 11/12/2012 12:27   Ct Head W Wo Contrast  11/08/2012   CLINICAL DATA:  Small cell lung cancer. Visual changes with loss of balance.  EXAM: CT HEAD WITHOUT AND WITH CONTRAST  TECHNIQUE: Contiguous axial images were obtained from the base of the skull through the vertex without and with intravenous contrast  CONTRAST:  OMNIPAQUE IOHEXOL 300 MG/ML  SOLN  COMPARISON:  Head CT 08/17/2012. PET-CT 08/06/2012.  FINDINGS: There is subtle increased density in the subarachnoid space over the right frontal lobe on images 21 through 27. There is no abnormal enhancement in this area, although the cortical vessels appear medially displaced compared with the prior study,  suggesting a subdural process. There is no mass effect on the cortex or midline shift. There is no evidence of acute  intracranial hemorrhage, mass lesion or abnormal intraparenchymal enhancement. There is no hydrocephalus.  The visualized paranasal sinuses are clear aside from chronic ethmoid sinus mucosal thickening. The mastoid air cells and middle ears are clear. The calvarium is intact.  IMPRESSION: New probable right frontal subdural process with minimally increased density, no abnormal enhancement and no significant localized mass effect. This may reflect a subacute or chronic subdural hematoma. The lack of enhancement makes metastatic disease unlikely, and there is no evidence of acute intracranial hemorrhage. Leptomeningeal disease can be subtle by CT, and if that is a clinical concern, further evaluation with MRI or lumbar puncture should be considered.   Electronically Signed   By: Roxy Horseman M.D.   On: 11/08/2012 13:18   Ct Chest W Contrast  11/08/2012   CLINICAL DATA:  History a small cell lung cancer. Restaging scan.  EXAM: CT CHEST, ABDOMEN, AND PELVIS WITH CONTRAST  TECHNIQUE: Multidetector CT imaging of the chest, abdomen and pelvis was performed following the standard protocol during bolus administration of intravenous contrast.  CONTRAST:  OMNIPAQUE IOHEXOL 300 MG/ML  SOLN  COMPARISON:  CT of the chest, abdomen and pelvis 09/27/2012.  FINDINGS: CT CHEST FINDINGS  Mediastinum: Heart size is normal. There is no significant pericardial fluid, thickening or pericardial calcification. No pathologically enlarged mediastinal or hilar lymph nodes. Esophagus is unremarkable in appearance. Left-sided internal jugular single-lumen porta cath with tip terminating in the mid superior vena cava.  Lungs/Pleura: Extensive nodular pleural thickening is again noted throughout the right hemithorax, in association with a large right pleural effusion, presumably a malignant pleural effusion. There is a  pleural based mass like opacity in the inferior aspect of the right lower lobe, similar to numerous prior examinations, favored to represent an area of rounded atelectasis. In addition, there is a 6.4 x 2.4 cm mass centered in the right middle lobe, slightly larger than the prior examination (image 39 of series 5). Previously noted 7 mm left lower lobe nodule is also unchanged (image 39 of series 7). No definite new pulmonary nodules are otherwise identified. No acute consolidative airspace disease.  Musculoskeletal: There are no aggressive appearing lytic or blastic lesions noted in the visualized portions of the skeleton.  CT ABDOMEN AND PELVIS FINDINGS  Abdomen/Pelvis: Again noted are numerous low-attenuation hepatic lesions which appear well-defined and uniformly low attenuation. The largest of these are all compatible with simple cysts, while the smaller lesions are too small to definitively characterize. The largest single hepatic lesion is in central aspects of segments 7 and 8 measuring up to 8.6 x 6.7 cm. Status post cholecystectomy. The appearance of the pancreas, spleen and bilateral adrenal glands is unremarkable. Mild renal atrophy bilaterally. In the lateral aspect of the interpolar region of the left kidney there is a focal area of architectural distortion and calcifications, which appears to represent an old radiofrequency ablation defect.  Extensive atherosclerosis throughout the abdominal and pelvic vasculature, without definite aneurysm or dissection. No significant volume of ascites. No pneumoperitoneum. No pathologic distention of small bowel. Extensive colonic diverticulosis without findings to suggest acute diverticulitis at this time. No definite lymphadenopathy identified within the abdomen or pelvis. Prostate gland is severely enlarged measuring up to 6.5 x 5.2 cm. Urinary bladder is unremarkable in appearance.  Musculoskeletal: There are no aggressive appearing lytic or blastic lesions  noted in the visualized portions of the skeleton.  IMPRESSION: 1. Slight interval increase in size of right middle lobe mass. Right-sided malignant pleural effusion is very similar  in a appearance to the prior examination. 2. Probable round atelectasis in the right lower lobe is unchanged. 3. 7 mm left lower lobe nodule is unchanged and remains nonspecific. 4. No definite signs of metastatic disease to the abdomen or pelvis. 5. Numerous low-attenuation hepatic lesions appears similar to prior examinations. The largest of these are all compatible with simple cysts, while the smaller lesions are too small to definitively characterize. 6. Small calcified area in the lateral aspect of the interpolar region of the left kidney appears to represent scarring at site of old radiofrequency ablation, particularly when compared with remote prior examinations dating back to 12/17/2007. 7. Colonic diverticulosis without findings to suggest acute diverticulitis at this time.   Electronically Signed   By: Trudie Reed M.D.   On: 11/08/2012 13:42   Ct Abdomen Pelvis W Contrast  11/08/2012   CLINICAL DATA:  History a small cell lung cancer. Restaging scan.  EXAM: CT CHEST, ABDOMEN, AND PELVIS WITH CONTRAST  TECHNIQUE: Multidetector CT imaging of the chest, abdomen and pelvis was performed following the standard protocol during bolus administration of intravenous contrast.  CONTRAST:  OMNIPAQUE IOHEXOL 300 MG/ML  SOLN  COMPARISON:  CT of the chest, abdomen and pelvis 09/27/2012.  FINDINGS: CT CHEST FINDINGS  Mediastinum: Heart size is normal. There is no significant pericardial fluid, thickening or pericardial calcification. No pathologically enlarged mediastinal or hilar lymph nodes. Esophagus is unremarkable in appearance. Left-sided internal jugular single-lumen porta cath with tip terminating in the mid superior vena cava.  Lungs/Pleura: Extensive nodular pleural thickening is again noted throughout the right  hemithorax, in association with a large right pleural effusion, presumably a malignant pleural effusion. There is a pleural based mass like opacity in the inferior aspect of the right lower lobe, similar to numerous prior examinations, favored to represent an area of rounded atelectasis. In addition, there is a 6.4 x 2.4 cm mass centered in the right middle lobe, slightly larger than the prior examination (image 39 of series 5). Previously noted 7 mm left lower lobe nodule is also unchanged (image 39 of series 7). No definite new pulmonary nodules are otherwise identified. No acute consolidative airspace disease.  Musculoskeletal: There are no aggressive appearing lytic or blastic lesions noted in the visualized portions of the skeleton.  CT ABDOMEN AND PELVIS FINDINGS  Abdomen/Pelvis: Again noted are numerous low-attenuation hepatic lesions which appear well-defined and uniformly low attenuation. The largest of these are all compatible with simple cysts, while the smaller lesions are too small to definitively characterize. The largest single hepatic lesion is in central aspects of segments 7 and 8 measuring up to 8.6 x 6.7 cm. Status post cholecystectomy. The appearance of the pancreas, spleen and bilateral adrenal glands is unremarkable. Mild renal atrophy bilaterally. In the lateral aspect of the interpolar region of the left kidney there is a focal area of architectural distortion and calcifications, which appears to represent an old radiofrequency ablation defect.  Extensive atherosclerosis throughout the abdominal and pelvic vasculature, without definite aneurysm or dissection. No significant volume of ascites. No pneumoperitoneum. No pathologic distention of small bowel. Extensive colonic diverticulosis without findings to suggest acute diverticulitis at this time. No definite lymphadenopathy identified within the abdomen or pelvis. Prostate gland is severely enlarged measuring up to 6.5 x 5.2 cm. Urinary  bladder is unremarkable in appearance.  Musculoskeletal: There are no aggressive appearing lytic or blastic lesions noted in the visualized portions of the skeleton.  IMPRESSION: 1. Slight interval increase in size  of right middle lobe mass. Right-sided malignant pleural effusion is very similar in a appearance to the prior examination. 2. Probable round atelectasis in the right lower lobe is unchanged. 3. 7 mm left lower lobe nodule is unchanged and remains nonspecific. 4. No definite signs of metastatic disease to the abdomen or pelvis. 5. Numerous low-attenuation hepatic lesions appears similar to prior examinations. The largest of these are all compatible with simple cysts, while the smaller lesions are too small to definitively characterize. 6. Small calcified area in the lateral aspect of the interpolar region of the left kidney appears to represent scarring at site of old radiofrequency ablation, particularly when compared with remote prior examinations dating back to 12/17/2007. 7. Colonic diverticulosis without findings to suggest acute diverticulitis at this time.   Electronically Signed   By: Trudie Reed M.D.   On: 11/08/2012 13:42   US Thoracentesis Asp Pleural Space W/img Guide  11/12/2012   CLINICAL DATA:  Small-cell lung carcinoma, recurrent right pleural effusion. Request is made for diagnostic and therapeutic right thoracentesis.  EXAM: ULTRASOUND GUIDED DIAGNOSTIC AND THERAPEUTIC RIGHT THORACENTESIS  FINDINGS: A total of approximately 500 cc's of yellow/amber fluid was removed. The fluid sample wassent for cytology.  IMPRESSION: Successful ultrasound guided diagnostic and therapeutic right thoracentesis yielding 500 cc's of pleural fluid.  Read by: Jeananne Rama ,P.A.-C.  PROCEDURE: An ultrasound guided thoracentesis was thoroughly discussed with the patient and questions answered. The benefits, risks, alternatives and complications were also discussed. The patient understands and wishes to  proceed with the procedure. Written consent was obtained.  Ultrasound was performed to localize and mark an adequate pocket of fluid in the right chest. The area was then prepped and draped in the normal sterile fashion. 1% Lidocaine was used for local anesthesia. Under ultrasound guidance a 19 gauge Yueh catheter was introduced. Thoracentesis was performed. The catheter was removed and a dressing applied.  Complications:  none   Electronically Signed   By: Simonne Come M.D.   On: 11/12/2012 14:08      IMPRESSION: Extensive stage small cell lung cancer. The patient has had minimal response to his 4 cycles of chemotherapy. He is having pain along the right posterior chest which is likely related related to his pleural disease and lung mass. He would be a candidate for palliative treatments directed at the right chest region.  I am unsure of the etiology of his low back pain but maybe osseous metastasis although this is not immediately apparent on his recent CT scan. Patient is seeking  a second opinion at [facility] scheduled for November 4. I will hold off on planning any palliative radiation therapy until this consultation.  PLAN: Tentative simulation for November 5  I spent 60 minutes minutes face to face with the patient and more than 50% of that time was spent in counseling and/or coordination of care.   ------------------------------------------------  -----------------------------------  Billie Lade, PhD, MD

## 2012-11-17 NOTE — Progress Notes (Signed)
Please see the Nurse Progress Note in the MD Initial Consult Encounter for this patient. 

## 2012-11-24 ENCOUNTER — Other Ambulatory Visit: Payer: Medicare Other | Admitting: Lab

## 2012-11-24 ENCOUNTER — Ambulatory Visit: Payer: Medicare Other | Admitting: Radiation Oncology

## 2012-11-25 ENCOUNTER — Other Ambulatory Visit: Payer: Self-pay | Admitting: *Deleted

## 2012-11-25 ENCOUNTER — Other Ambulatory Visit: Payer: Self-pay

## 2012-11-25 DIAGNOSIS — C3491 Malignant neoplasm of unspecified part of right bronchus or lung: Secondary | ICD-10-CM

## 2012-11-25 DIAGNOSIS — C3490 Malignant neoplasm of unspecified part of unspecified bronchus or lung: Secondary | ICD-10-CM

## 2012-11-25 MED ORDER — LORAZEPAM 0.5 MG PO TABS: 0.5000 mg | ORAL_TABLET | Freq: Every evening | ORAL | Status: DC | PRN

## 2012-11-25 MED ORDER — OXYCODONE-ACETAMINOPHEN 5-325 MG PO TABS: 1.0000 | ORAL_TABLET | ORAL | Status: DC | PRN

## 2012-11-25 MED ORDER — FENTANYL 25 MCG/HR TD PT72: 25.0000 ug | MEDICATED_PATCH | TRANSDERMAL | Status: DC

## 2012-11-25 NOTE — Telephone Encounter (Signed)
Pt's wife called stating that he started using the fentanyl patches and is requesting a stronger dose.  Per dr Donnald Garre, ok to increase to .  SLJ

## 2012-11-25 NOTE — Progress Notes (Signed)
[facility] Psychosocial Distress Screening Clinical Social Work  Clinical Social Work was referred by distress screening protocol.  The patient scored a 5 on the Psychosocial Distress Thermometer which indicates moderate distress. Clinical Social Worker Intern telephoned to assess for distress and other psychosocial needs. Patient is understanding of his current condition.  Patient stated "it is hard to handle" but that he was doing okay.  CSWI provided supportive listening.  CSWI shared about supportive services and made Patient aware of SW availability should Patient need further assistance.   Clinical Social Worker follow up needed: no  If yes, follow up plan:   Kiesha Ensey S. [facility] Clinical Social Work Intern [employer] [facility] [phone]

## 2012-12-01 ENCOUNTER — Ambulatory Visit: Payer: Medicare Other

## 2012-12-01 ENCOUNTER — Other Ambulatory Visit: Payer: Medicare Other | Admitting: Lab

## 2012-12-02 ENCOUNTER — Ambulatory Visit: Payer: Medicare Other

## 2012-12-03 ENCOUNTER — Telehealth: Payer: Self-pay | Admitting: Internal Medicine

## 2012-12-03 ENCOUNTER — Ambulatory Visit: Payer: Medicare Other

## 2012-12-03 NOTE — Telephone Encounter (Signed)
Patient called requesting the doctor give me a call back at [phone] Didn't provide any further information

## 2012-12-04 ENCOUNTER — Ambulatory Visit: Payer: Medicare Other

## 2012-12-07 NOTE — Telephone Encounter (Signed)
Patient has scca lung with [facility]. Failing. Has gone to [facility] for investgation.   He wants a referral to an internist that will also follow him as an in-patient, hospice patient etc. - referred to [name].

## 2012-12-08 ENCOUNTER — Other Ambulatory Visit: Payer: Medicare Other | Admitting: Lab

## 2012-12-09 ENCOUNTER — Telehealth: Payer: Self-pay | Admitting: Pulmonary Disease

## 2012-12-09 NOTE — Telephone Encounter (Signed)
I called pt appt scheduled 12/21/12 with TP. Nothing further needed

## 2012-12-09 NOTE — Telephone Encounter (Signed)
He finished chemo x 4 cycles, RT started Last CXR reviewed from 10/2012 Pl make fu appt with me/ TP when available

## 2012-12-09 NOTE — Telephone Encounter (Signed)
It looks as if he had lasix script filled in October. Ok to take occasional 20 mg tab, once daily as needed for fluid retention. This will not help the pleural effusion fluid in the chest.

## 2012-12-09 NOTE — Telephone Encounter (Signed)
Called and spoke with pts wife and she is aware of CY recs to take the lasix 20 mg once daily as needed for fluid retention. She is aware that the lasix will not help with the pleural effusion fluid in the chest.  She stated that she will try this but if this does not help him she will be calling back.  Nothing further is needed.

## 2012-12-09 NOTE — Telephone Encounter (Signed)
Spoke with pt's wife. Pt currently has fluid on his lungs. Was told by Dr. Shirline Frees that there was nothing else that could done about this. Reports SOB, chest tightness and bilateral feet/ankle/leg edema. Is not currently taking Lasix 20mg , Dr. Shirline Frees advised for him not to take this unless it was absolutely necessary. Pt's wife states that the pt is very comfortable.  Allergies  Allergen Reactions  . Erythromycin     REACTION: Diarrhea  . Tetracycline     unknown    Current Outpatient Prescriptions on File Prior to Visit  Medication Sig Dispense Refill  . acetaminophen (TYLENOL) 500 MG tablet Take 500 mg by mouth every 6 (six) hours as needed for pain.      Marland Kitchen Alum & Mag Hydroxide-Simeth (MAGIC MOUTHWASH) SOLN Take 5 mLs by mouth 4 (four) times daily as needed.  240 mL  0  . cyanocobalamin 2000 MCG tablet Take 2,000 mcg by mouth daily.        Marland Kitchen dexlansoprazole (DEXILANT) 60 MG capsule Take 1 capsule (60 mg total) by mouth daily.  5 capsule  0  . dextromethorphan-guaiFENesin (MUCINEX DM) 30-600 MG per 12 hr tablet Take 1 tablet by mouth every 12 (twelve) hours.      . fentaNYL (DURAGESIC - DOSED MCG/HR) 25 MCG/HR patch Place 1 patch (25 mcg total) onto the skin every 3 (three) days.  10 patch  0  . finasteride (PROSCAR) 5 MG tablet Take 5 mg by mouth daily.        . furosemide (LASIX) 20 MG tablet Take 1 tablet (20 mg total) by mouth as needed. 20mg  as needed for swelling.  30 tablet  0  . levothyroxine (SYNTHROID, LEVOTHROID) 75 MCG tablet Take 75 mcg by mouth daily.        Marland Kitchen lidocaine-prilocaine (EMLA) cream Apply topically as needed.  30 g  0  . LORazepam (ATIVAN) 0.5 MG tablet Take 1 tablet (0.5 mg total) by mouth at bedtime as needed (insomnia). Future refills per PCP  30 tablet  0  . LORazepam (ATIVAN) 1 MG tablet 1 tablet by mouth once for MRI  2 tablet  0  . oxyCODONE-acetaminophen (PERCOCET/ROXICET) 5-325 MG per tablet Take 1 tablet by mouth every 4 (four) hours as needed (for  pain).  60 tablet  0  . prochlorperazine (COMPAZINE) 10 MG tablet Take 1 tablet (10 mg total) by mouth every 6 (six) hours as needed.  60 tablet  0  . terazosin (HYTRIN) 1 MG capsule Take 1 mg by mouth 2 (two) times daily.        Current Facility-Administered Medications on File Prior to Visit  Medication Dose Route Frequency Provider Last Rate Last Dose  . TDaP (BOOSTRIX) injection 0.5 mL  0.5 mL Intramuscular Once Jacques Navy, MD        CY - please advise in Dr. Reginia Naas absence. Thanks.

## 2012-12-10 ENCOUNTER — Telehealth: Payer: Self-pay | Admitting: Medical Oncology

## 2012-12-10 DIAGNOSIS — C3490 Malignant neoplasm of unspecified part of unspecified bronchus or lung: Secondary | ICD-10-CM

## 2012-12-10 MED ORDER — LORAZEPAM 0.5 MG PO TABS: 0.5000 mg | ORAL_TABLET | Freq: Every evening | ORAL | Status: DC | PRN

## 2012-12-10 MED ORDER — LORAZEPAM 0.5 MG PO TABS: 0.5000 mg | ORAL_TABLET | Freq: Three times a day (TID) | ORAL | Status: DC | PRN

## 2012-12-10 NOTE — Telephone Encounter (Signed)
Ativan refill called in per dr Arbutus Ped.

## 2012-12-10 NOTE — Addendum Note (Signed)
Addended by: Charma Igo on: 12/10/2012 04:42 PM   Modules accepted: Orders, Medications

## 2012-12-10 NOTE — Telephone Encounter (Addendum)
Wife stated that pharmacy will not fill ativan until 29th . She said he is " taking ativan three times a day  for  panic attacks which makes him feel like he can't breath and he feels like his throat is closing up". I spoke to pt and he said he feels scared and has crying episodes . He keeps the lights and TV on all the time. He does not sleep well.  It is especially bad at 8pm when [name] goes to bed and he is in another room by himself . Per Adrena I cancelled ativan 0.5 mg hs prn and called in :  ativan 0.5 mg every 8 hours prn for anxiety-a five day supply . I told [place]  these instructions and that Dr Arbutus Ped will address on Monday.

## 2012-12-11 ENCOUNTER — Emergency Department (HOSPITAL_COMMUNITY)
Admission: EM | Admit: 2012-12-11 | Discharge: 2012-12-11 | Disposition: A | Discharge status: Home / Self Care | Payer: Medicare Other | Attending: Emergency Medicine | Admitting: Emergency Medicine

## 2012-12-11 ENCOUNTER — Emergency Department (HOSPITAL_COMMUNITY): Payer: Medicare Other

## 2012-12-11 ENCOUNTER — Other Ambulatory Visit: Payer: Self-pay | Admitting: Oncology

## 2012-12-11 DIAGNOSIS — Z860100 Personal history of colon polyps, unspecified: Secondary | ICD-10-CM | POA: Insufficient documentation

## 2012-12-11 DIAGNOSIS — Z85118 Personal history of other malignant neoplasm of bronchus and lung: Secondary | ICD-10-CM | POA: Insufficient documentation

## 2012-12-11 DIAGNOSIS — Z8639 Personal history of other endocrine, nutritional and metabolic disease: Secondary | ICD-10-CM | POA: Insufficient documentation

## 2012-12-11 DIAGNOSIS — R0989 Other specified symptoms and signs involving the circulatory and respiratory systems: Secondary | ICD-10-CM | POA: Insufficient documentation

## 2012-12-11 DIAGNOSIS — J910 Malignant pleural effusion: Secondary | ICD-10-CM

## 2012-12-11 DIAGNOSIS — Z85819 Personal history of malignant neoplasm of unspecified site of lip, oral cavity, and pharynx: Secondary | ICD-10-CM | POA: Insufficient documentation

## 2012-12-11 DIAGNOSIS — Z8679 Personal history of other diseases of the circulatory system: Secondary | ICD-10-CM | POA: Insufficient documentation

## 2012-12-11 DIAGNOSIS — R109 Unspecified abdominal pain: Secondary | ICD-10-CM | POA: Insufficient documentation

## 2012-12-11 DIAGNOSIS — Z862 Personal history of diseases of the blood and blood-forming organs and certain disorders involving the immune mechanism: Secondary | ICD-10-CM | POA: Insufficient documentation

## 2012-12-11 DIAGNOSIS — R0601 Orthopnea: Secondary | ICD-10-CM | POA: Insufficient documentation

## 2012-12-11 DIAGNOSIS — Z87448 Personal history of other diseases of urinary system: Secondary | ICD-10-CM | POA: Insufficient documentation

## 2012-12-11 DIAGNOSIS — R0609 Other forms of dyspnea: Secondary | ICD-10-CM | POA: Insufficient documentation

## 2012-12-11 DIAGNOSIS — Z923 Personal history of irradiation: Secondary | ICD-10-CM | POA: Insufficient documentation

## 2012-12-11 DIAGNOSIS — J90 Pleural effusion, not elsewhere classified: Secondary | ICD-10-CM | POA: Insufficient documentation

## 2012-12-11 DIAGNOSIS — Z8719 Personal history of other diseases of the digestive system: Secondary | ICD-10-CM | POA: Insufficient documentation

## 2012-12-11 DIAGNOSIS — Z79899 Other long term (current) drug therapy: Secondary | ICD-10-CM | POA: Insufficient documentation

## 2012-12-11 LAB — BASIC METABOLIC PANEL
BUN: 17 mg/dL (ref 6–23)
CO2: 23 mEq/L (ref 19–32)
Calcium: 9.4 mg/dL (ref 8.4–10.5)
GFR calc Af Amer: 90 mL/min — ABNORMAL LOW (ref >90)
GFR calc non Af Amer: 78 mL/min — ABNORMAL LOW (ref >90)
Glucose, Bld: 96 mg/dL (ref 70–99)
Potassium: 4.1 mEq/L (ref 3.5–5.1)
Sodium: 135 mEq/L (ref 135–145)

## 2012-12-11 LAB — CBC WITH DIFFERENTIAL/PLATELET
Basophils Relative: 1 % (ref 0–1)
Eosinophils Absolute: 0.1 10*3/uL (ref 0.0–0.7)
Eosinophils Relative: 1 % (ref 0–5)
HCT: 30.6 % — ABNORMAL LOW (ref 39.0–52.0)
Lymphocytes Relative: 7 % — ABNORMAL LOW (ref 12–46)
Lymphs Abs: 0.6 10*3/uL — ABNORMAL LOW (ref 0.7–4.0)
MCH: 30.9 pg (ref 26.0–34.0)
MCHC: 32.4 g/dL (ref 30.0–36.0)
MCV: 95.6 fL (ref 78.0–100.0)
Monocytes Absolute: 0.7 10*3/uL (ref 0.1–1.0)
Monocytes Relative: 8 % (ref 3–12)
Neutro Abs: 7.3 10*3/uL (ref 1.7–7.7)
Neutrophils Relative %: 84 % — ABNORMAL HIGH (ref 43–77)
Platelets: 241 10*3/uL (ref 150–400)
RBC: 3.2 MIL/uL — ABNORMAL LOW (ref 4.22–5.81)
WBC: 8.7 10*3/uL (ref 4.0–10.5)

## 2012-12-11 LAB — PRO B NATRIURETIC PEPTIDE: Pro B Natriuretic peptide (BNP): 430 pg/mL (ref 0–450)

## 2012-12-11 MED ORDER — HEPARIN SOD (PORK) LOCK FLUSH 100 UNIT/ML IV SOLN
500.0000 [IU] | Freq: Once | INTRAVENOUS | Status: AC
  Administered 2012-12-11: 500 [IU]
  Filled 2012-12-11: qty 5

## 2012-12-11 NOTE — ED Notes (Signed)
Pt refuses to take shirt off

## 2012-12-11 NOTE — ED Notes (Signed)
Nurse at bedside accessing port. Xray attempted to get pt for chest xray.

## 2012-12-11 NOTE — ED Notes (Signed)
Patient transported to X-ray 

## 2012-12-11 NOTE — ED Notes (Signed)
MD at bedside. Xray attempt to get pt for chest xray.

## 2012-12-11 NOTE — ED Notes (Signed)
Attempted to put gown on patient. Patient refused.

## 2012-12-11 NOTE — ED Notes (Signed)
Staff at bedside performing EKG. Xray attempt to get pt for Chest xray.

## 2012-12-11 NOTE — ED Notes (Signed)
Pt sees Dr. Neldon Labella

## 2012-12-11 NOTE — ED Notes (Signed)
Family at bedside. 

## 2012-12-11 NOTE — ED Notes (Signed)
Pt called Dr. Neldon Labella and was told to come to ED to have an xray of his chest  So they can see if a thoracentesis was needed at this time.

## 2012-12-11 NOTE — ED Provider Notes (Signed)
CSN: [id number]     Arrival date & time 12/11/12  1119 History   First MD Initiated Contact with Patient 12/11/12 1139     Chief Complaint  Patient presents with  . Shortness of Breath    Small Cell Lung CA   (Consider location/radiation/quality/duration/timing/severity/associated sxs/prior Treatment) HPI  This is a 78 yo with history of small cell lung cancer who presents with shortness of breath. Patient reports progressive shortness of breath, dyspnea on exertion, and orthopnea. He denies any cough or fevers. Patient states that he has had to have multiple right-sided thoracenteses for accumulation of fluid and he feels that this is the cause. Patient denies any chest pain. Last had have fluid drawn off 5-6 weeks ago.  Past Medical History  Diagnosis Date  . Hyperlipidemia   . OSA (obstructive sleep apnea)   . BPH (benign prostatic hypertrophy)   . Kidney disease     TCC  . Xerostomia   . Pharynx cancer     squamous cell stage 4,s/p XRT,chemo, neck dissection  . Colon polyp     HYPERPLASTIC & TUBULAR ADENOMA(Colonoscopy-Dr.Woodstock)  . Diverticulosis of colon (without mention of hemorrhage)     (Colonoscopy-Dr.Thayer)  . Internal hemorrhoids without mention of complication     (Colonoscopy-Dr.Alice Acres)  . GERD (gastroesophageal reflux disease)     (EGD-Dr. Corinda Gubler)  . Atrophic gastritis without mention of hemorrhage     (EGD-Dr. Corinda Gubler)  . History of radiation therapy    Past Surgical History  Procedure Laterality Date  . Appendectomy    . Lumbar laminectomy    . Tonsillectomy    . Bilateral ganglionectomies    . Radical right neck dissection  2008  . Pci-rfa renal cell (tcc) cancer  2009    pt denies  . Cholecystectomy  dec. 2009  . Septoplasty  03/01/12    with double turbinectomy  . Video bronchoscopy Bilateral 08/10/2012    Procedure: VIDEO BRONCHOSCOPY WITH FLUORO;  Surgeon: Oretha Milch, MD;  Location: [facility] ENDOSCOPY;  Service: Cardiopulmonary;  Laterality:  Bilateral;  . Shoulder arthroscopy Left 2013  . Thoracentesis Right 10/2012   Family History  Problem Relation Age of Onset  . Heart failure Father   . Kidney failure Father   . Other Father     Renal failure/ CHF  . Heart failure Mother   . Other Mother     CHF  . Colon cancer Neg Hx   . Prostate cancer Neg Hx   . Diabetes Neg Hx    History  Substance Use Topics  . Smoking status: Never Smoker   . Smokeless tobacco: Never Used  . Alcohol Use: No    Review of Systems  Constitutional: Negative.  Negative for fever.  Respiratory: Positive for shortness of breath. Negative for cough and chest tightness.   Cardiovascular: Negative.  Negative for chest pain.  Gastrointestinal: Positive for abdominal pain.  Genitourinary: Negative.  Negative for dysuria.  Musculoskeletal: Negative for back pain.  Skin: Negative for rash.  All other systems reviewed and are negative.    Allergies  Erythromycin and Tetracycline  Home Medications   Current Outpatient Rx  Name  Route  Sig  Dispense  Refill  . fentaNYL (DURAGESIC - DOSED MCG/HR) 25 MCG/HR patch   Transdermal   Place 1 patch (25 mcg total) onto the skin every 3 (three) days.   10 patch   0   . finasteride (PROSCAR) 5 MG tablet   Oral   Take 5 mg  by mouth at bedtime.          . furosemide (LASIX) 20 MG tablet   Oral   Take 1 tablet (20 mg total) by mouth as needed. 20mg  as needed for swelling.   30 tablet   0   . levothyroxine (SYNTHROID, LEVOTHROID) 75 MCG tablet   Oral   Take 75 mcg by mouth daily before breakfast.          . lidocaine-prilocaine (EMLA) cream   Topical   Apply topically as needed.   30 g   0   . LORazepam (ATIVAN) 0.5 MG tablet   Oral   Take 1 tablet (0.5 mg total) by mouth every 8 (eight) hours as needed for anxiety.   15 tablet   0   . LORazepam (ATIVAN) 1 MG tablet      1 tablet by mouth once for MRI   2 tablet   0   . oxyCODONE-acetaminophen (PERCOCET/ROXICET) 5-325 MG per  tablet   Oral   Take 1 tablet by mouth every 4 (four) hours as needed (for pain).   60 tablet   0   . terazosin (HYTRIN) 1 MG capsule   Oral   Take 1 mg by mouth 2 (two) times daily.           BP 151/66  Pulse 62  Temp(Src) 98.4 F (36.9 C) (Oral)  Resp 19  SpO2 98% Physical Exam  Nursing note and vitals reviewed. Constitutional: He is oriented to person, place, and time.  Chronically ill appearing, no acute distress  HENT:  Head: Normocephalic and atraumatic.  Eyes: Pupils are equal, round, and reactive to light.  Neck: Neck supple.  Cardiovascular: Normal rate, regular rhythm and normal heart sounds.   No murmur heard. Pulmonary/Chest: Effort normal and breath sounds normal. No respiratory distress.  Fine crackles in the bases with fair air movement, decreased air movement over the RLL  Abdominal: Soft. He exhibits distension. There is no tenderness. There is no rebound and no guarding.  Musculoskeletal: He exhibits no edema.  Lymphadenopathy:    He has no cervical adenopathy.  Neurological: He is alert and oriented to person, place, and time.  Skin: Skin is warm and dry.  Psychiatric: He has a normal mood and affect.    ED Course  Procedures (including critical care time) Labs Review Labs Reviewed  CBC WITH DIFFERENTIAL - Abnormal; Notable for the following:    RBC 3.20 (*)    Hemoglobin 9.9 (*)    HCT 30.6 (*)    RDW 18.0 (*)    Neutrophils Relative % 84 (*)    Lymphocytes Relative 7 (*)    Lymphs Abs 0.6 (*)    All other components within normal limits  BASIC METABOLIC PANEL - Abnormal; Notable for the following:    GFR calc non Af Amer 78 (*)    GFR calc Af Amer 90 (*)    All other components within normal limits  PRO B NATRIURETIC PEPTIDE  POCT I-STAT TROPONIN I   Imaging Review Dg Chest 2 View  12/11/2012   CLINICAL DATA:  Shortness of breath, history of recurrent pleural effusions  EXAM: CHEST  2 VIEW  COMPARISON:  11/12/2012  FINDINGS: The  cardiac silhouette is partially obscured by the patient's stable right pleural effusion. No new focal regions of consolidation are new focal infiltrates appreciated. A left-sided port catheter is identified with tip projecting region superior vena cava. The left hemi thorax demonstrates no evidence  of focal infiltrates effusions or edema. Visualized osseous structures unremarkable. There is no evidence of pneumothorax.  IMPRESSION: 1. Stable right pleural effusion 2. No evidence of acute cardiopulmonary disease   Electronically Signed   By: Salome Holmes M.D.   On: 12/11/2012 13:10    EKG Interpretation    Date/Time:  Saturday December 11 2012 11:49:06 EST Ventricular Rate:  78 PR Interval:  141 QRS Duration: 147 QT Interval:  394 QTC Calculation: 449 R Axis:   77 Text Interpretation:  Sinus rhythm Right bundle branch block No significant change was found Confirmed by Jahaira Earnhart  MD, Sebastian Lurz ([id number]) on 12/11/2012 2:35:01 PM            MDM   1. Pleural effusion    This is a 78yo with small cell cancer of the lung who presents with progressive shortness of breath and history of right-sided pleural effusion. Patient is nontoxic on exam. He satting 98% on room air. There is no increased work of breathing or respiratory distress noted. Lab work and chest x-ray were initiated.  Chest x-ray shows reaccumulation of a right-sided pleural effusion. EKG is nonischemic and other lab work is at patient's baseline. I discussed results with the patient.   Patient was sent in by Dr. Cyndie Chime for thoracentesis. I discussed the patient with the on-call interventional radiologist who has 5 procedures to do today and cannot fit the patient in. I also discussed the patient with the on-call critical care Dr. who is unable to perform the procedure.  At this time this procedure would be urgent and not emergent as the patient is symptomatic but hemodynamically stable and satting well on room air. I have offered  the patient admission to see if the procedure can be done tomorrow.    The patient and his wife voice frustration and are very upset that they're not able to get this procedure done today. They state "we have been lied to and we were told we would be able to get this procedure."  They do not want admission. I spoke with Dr. Cyndie Chime regarding the patient's need for urgent/nonemergent thoracentesis. I also shared with him the patient's frustrations. He will set up a thoracentesis with IR on Monday.  After history, exam, and medical workup I feel the patient has been appropriately medically screened and is safe for discharge home. Pertinent diagnoses were discussed with the patient. Patient was given return precautions.  Patient was encouraged to return if he has worsening shortness of breath or any worsening of symptoms.       Shon Baton, MD 12/11/12 1450

## 2012-12-11 NOTE — ED Notes (Signed)
Xray made aware pt is ready

## 2012-12-11 NOTE — ED Notes (Addendum)
Pt has hx of [place] lung CA states he has to get a thoracentesis every so often to help him breath. Pt last one was 5-6 weeks ago. Pt states he is short of breath and cannot lay down. O2 98 RA

## 2012-12-11 NOTE — ED Notes (Signed)
Pt chest port accessed.

## 2012-12-11 NOTE — ED Notes (Signed)
Pt complains of right sided pain. Pt took his own oxycodone out of his pocket and broke it into 4 pieces and took it. It was explained to him we could order him his pain medication. We dont want him to take his own medication here without someone knowing.

## 2012-12-13 ENCOUNTER — Telehealth: Payer: Self-pay | Admitting: *Deleted

## 2012-12-13 MED ORDER — LORAZEPAM 0.5 MG PO TABS: 0.5000 mg | ORAL_TABLET | Freq: Three times a day (TID) | ORAL | Status: DC | PRN

## 2012-12-13 NOTE — Addendum Note (Signed)
Addended by: Caren Griffins on: 12/13/2012 04:33 PM   Modules accepted: Orders

## 2012-12-13 NOTE — Telephone Encounter (Signed)
Per Dr Donnald Garre, okay to give pt rx for ativan TID prn anxiety.  Rx called in, pt's wife aware.  SLJ

## 2012-12-13 NOTE — Telephone Encounter (Signed)
Received call from IR that pt does not want a thoracentesis and he is upset.  Informed Dr Arbutus Ped and Dr Cyndie Chime.  They will both be contacting patient to discuss concerns.  SLJ

## 2012-12-15 ENCOUNTER — Telehealth: Payer: Self-pay | Admitting: *Deleted

## 2012-12-15 NOTE — Telephone Encounter (Signed)
Pt's wife called stating that pt is refusing to take his ativan.  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  She wants to know if he can have xanax instead.  Advised that he not operated machinery or drive.  Per Dr Donnald Garre, pt can decrease the ativan to BID if needed but he would not switch to xanax at this time.  [name] also said that pt will be having surgery at [facility].  She states that their scan showed something different than what our cancer center's scan showed.  Will inform Dr Donnald Garre.  SLJ

## 2012-12-21 ENCOUNTER — Ambulatory Visit: Payer: Medicare Other | Admitting: Adult Health

## 2012-12-23 ENCOUNTER — Telehealth: Payer: Self-pay | Admitting: Medical Oncology

## 2012-12-23 DIAGNOSIS — C3490 Malignant neoplasm of unspecified part of unspecified bronchus or lung: Secondary | ICD-10-CM

## 2012-12-23 DIAGNOSIS — C3491 Malignant neoplasm of unspecified part of right bronchus or lung: Secondary | ICD-10-CM

## 2012-12-23 MED ORDER — FENTANYL 25 MCG/HR TD PT72: 25.0000 ug | MEDICATED_PATCH | TRANSDERMAL | Status: DC

## 2012-12-23 NOTE — Telephone Encounter (Signed)
Needs pleurix drainage kit and fentanyl refill. RX locked in injection room.

## 2012-12-24 NOTE — Telephone Encounter (Signed)
Pt seen walking down hall holding his side . PT taken back to lbby seat with wife. Wife stated that he needs a Pleurix drainage kit. I spoke to wife and told her to call radiologist at [facility]  who put in pleurix. She said they are mailing a drainage kit to pt.

## 2012-12-27 ENCOUNTER — Other Ambulatory Visit: Payer: Self-pay | Admitting: Medical Oncology

## 2012-12-27 ENCOUNTER — Telehealth: Payer: Self-pay | Admitting: Medical Oncology

## 2012-12-27 DIAGNOSIS — C3490 Malignant neoplasm of unspecified part of unspecified bronchus or lung: Secondary | ICD-10-CM

## 2012-12-27 NOTE — Telephone Encounter (Signed)
I suggested to [name] to get a pill cutter to cut ativan or see if pharmacist will cut them. Per Dr Arbutus Ped he will be happy to see pt to discuss his needs. I sent POF for appt wed with Adrena and wife notified of appt. I also called and left message at [facility] for Dr Illene Bolus nurse to call pt about his care needs.

## 2012-12-27 NOTE — Telephone Encounter (Signed)
Requests nebulizer with moisture, lorazepam dose " smaller tablets , he cannot break the 0.5 mg tablets, bandage for Pleurix dressing"

## 2012-12-28 ENCOUNTER — Other Ambulatory Visit: Payer: Self-pay | Admitting: Medical Oncology

## 2012-12-28 ENCOUNTER — Telehealth: Payer: Self-pay | Admitting: Internal Medicine

## 2012-12-28 NOTE — Telephone Encounter (Signed)
s.w. pt and advised on 12.10 appt...pt ok and awre

## 2012-12-29 ENCOUNTER — Encounter: Payer: Self-pay | Admitting: Physician Assistant

## 2012-12-29 ENCOUNTER — Other Ambulatory Visit (HOSPITAL_BASED_OUTPATIENT_CLINIC_OR_DEPARTMENT_OTHER): Payer: Medicare Other

## 2012-12-29 ENCOUNTER — Ambulatory Visit (HOSPITAL_BASED_OUTPATIENT_CLINIC_OR_DEPARTMENT_OTHER): Payer: Medicare Other | Admitting: Physician Assistant

## 2012-12-29 ENCOUNTER — Ambulatory Visit (HOSPITAL_BASED_OUTPATIENT_CLINIC_OR_DEPARTMENT_OTHER): Payer: Medicare Other

## 2012-12-29 VITALS — BP 134/48 | HR 68 | Temp 98.2°F | Resp 17 | Ht 72.0 in | Wt 194.1 lb

## 2012-12-29 DIAGNOSIS — C342 Malignant neoplasm of middle lobe, bronchus or lung: Secondary | ICD-10-CM

## 2012-12-29 DIAGNOSIS — C3490 Malignant neoplasm of unspecified part of unspecified bronchus or lung: Secondary | ICD-10-CM

## 2012-12-29 DIAGNOSIS — Z452 Encounter for adjustment and management of vascular access device: Secondary | ICD-10-CM

## 2012-12-29 DIAGNOSIS — F411 Generalized anxiety disorder: Secondary | ICD-10-CM

## 2012-12-29 DIAGNOSIS — C3491 Malignant neoplasm of unspecified part of right bronchus or lung: Secondary | ICD-10-CM

## 2012-12-29 DIAGNOSIS — D6481 Anemia due to antineoplastic chemotherapy: Secondary | ICD-10-CM

## 2012-12-29 LAB — COMPREHENSIVE METABOLIC PANEL (CC13)
ALT: 11 U/L (ref 0–55)
AST: 18 U/L (ref 5–34)
Alkaline Phosphatase: 100 U/L (ref 40–150)
Anion Gap: 10 mEq/L (ref 3–11)
BUN: 11.4 mg/dL (ref 7.0–26.0)
CO2: 28 mEq/L (ref 22–29)
Creatinine: 0.9 mg/dL (ref 0.7–1.3)
Sodium: 138 mEq/L (ref 136–145)
Total Bilirubin: 0.24 mg/dL (ref 0.20–1.20)
Total Protein: 7 g/dL (ref 6.4–8.3)

## 2012-12-29 LAB — CBC WITH DIFFERENTIAL/PLATELET
BASO%: 0.7 % (ref 0.0–2.0)
EOS%: 0.7 % (ref 0.0–7.0)
HCT: 27.4 % — ABNORMAL LOW (ref 38.4–49.9)
LYMPH%: 8.3 % — ABNORMAL LOW (ref 14.0–49.0)
MCH: 31.9 pg (ref 27.2–33.4)
MCHC: 33.3 g/dL (ref 32.0–36.0)
MONO%: 10.4 % (ref 0.0–14.0)
NEUT%: 79.9 % — ABNORMAL HIGH (ref 39.0–75.0)
Platelets: 282 10*3/uL (ref 140–400)
RBC: 2.86 10*6/uL — ABNORMAL LOW (ref 4.20–5.82)
WBC: 6.5 10*3/uL (ref 4.0–10.3)

## 2012-12-29 MED ORDER — ALBUTEROL SULFATE (2.5 MG/3ML) 0.083% IN NEBU: 2.5000 mg | INHALATION_SOLUTION | Freq: Four times a day (QID) | RESPIRATORY_TRACT | Status: DC | PRN

## 2012-12-29 MED ORDER — OXYCODONE-ACETAMINOPHEN 10-325 MG PO TABS: 1.0000 | ORAL_TABLET | ORAL | Status: DC | PRN

## 2012-12-29 MED ORDER — SODIUM CHLORIDE 0.9 % IJ SOLN
10.0000 mL | INTRAMUSCULAR | Status: DC | PRN
  Administered 2012-12-29: 10 mL via INTRAVENOUS
  Filled 2012-12-29: qty 10

## 2012-12-29 MED ORDER — HEPARIN SOD (PORK) LOCK FLUSH 100 UNIT/ML IV SOLN
500.0000 [IU] | Freq: Once | INTRAVENOUS | Status: AC
  Administered 2012-12-29: 500 [IU] via INTRAVENOUS
  Filled 2012-12-29: qty 5

## 2012-12-29 NOTE — Progress Notes (Addendum)
[facility] [phone]   [phone]  SHARED VISIT PROGRESS NOTE  Samuel Regulus, MD [address] [place] [address]  DIAGNOSIS AND STAGE: Extensive stage small cell lung cancer diagnosed in July 2014.   PRIOR THERAPY: None   CURRENT THERAPY: Systemic chemotherapy with carboplatin for AUC of 5 on day 1 and etoposide 120 mg/M2 on days 1, 2 and 3 with Neulasta support on day 4, status post 4 cycles. First dose was given on 08/18/2012.   CHEMOTHERAPY INTENT: Palliative  CURRENT # OF CHEMOTHERAPY CYCLES: 4 CURRENT ANTIEMETICS: Zofran, dexamethasone and Compazine  CURRENT SMOKING STATUS: Never smoker  ORAL CHEMOTHERAPY AND CONSENT: None  CURRENT BISPHOSPHONATES USE: None  PAIN MANAGEMENT: 2/10, Tyleno#3 is changed to Percocet  NARCOTICS INDUCED CONSTIPATION: None  LIVING WILL AND CODE STATUS: Full code.  INTERVAL HISTORY: Samuel Hurley 78 y.o. male returns to the clinic today for followup visit accompanied by his wife Samuel Needle. The patient is currently being evaluated by Dr. Okey Dupre at [facility] for a clinical trial. He is status post Pleurx catheter placement at [facility] approximately 12/23/2012. The patient and his wife state that the drainage from the Pleurx catheter is now between 50 and 150 cc. He complains of pain on the right side of the chest and this is where the Pleurx catheter is. Both he and his wife complained that he was sent home from [facility] without supplies for his Pleurx catheter. His main complaint  and also the major concern of his wife is he feels that he "can't breathe". This sensation is causing him significant duress to the point of increased agitation which seems to make the symptoms worse. He denied cough, hemoptysis or fever or chills. The sensation of not being able to breathe escalates into "panic attacks". He was prescribed Ativan 0.5 mg every 8 hours as needed for anxiety however his wife felt that it was too  strong because he would sleep or rest after taking the medication. She did attempt to cut the tablets in half and give him a half dose but this did not seem effective to address his symptoms. He is been started on Zoloft for depression. He reports some problems swallowing pills and certain types of foods. Denies any choking episodes. He continues on the fentanyl patch 25 mcg every 3 days as well as Percocet 5/325 mg tablets. There is a concern that the Percocet dose is not strong enough. Patient and his wife state that he has a followup appointment next week the believe on the 16th at [facility] with Dr. Okey Dupre.  Of note during the entire appointment which lasted approximately an hour and 20 minutes, the patient was very verbal discussing his complaints and concerns without any shortness of breath, any use of accessory muscles or other outward signs or symptoms of respiratory distress. He exhibited some erratic behavior and  he stood up and stormed out of the exam room with his wife chasing after him. He left the exam room on several occasions to walk from our exam room area to the front of the cancer Center with out shortness of breath. He stated that he couldn't breathe and had to get outside and get some air. He is able to drink a cup of coffee as well as a cup of water with out any respiratory distress.   MEDICAL HISTORY: Past Medical History  Diagnosis Date  . Hyperlipidemia   . OSA (obstructive sleep apnea)   .  BPH (benign prostatic hypertrophy)   . Kidney disease     TCC  . Xerostomia   . Pharynx cancer     squamous cell stage 4,s/p XRT,chemo, neck dissection  . Colon polyp     HYPERPLASTIC & TUBULAR ADENOMA(Colonoscopy-Dr.[place])  . Diverticulosis of colon (without mention of hemorrhage)     (Colonoscopy-Dr.Force)  . Internal hemorrhoids without mention of complication     (Colonoscopy-Dr.)  . GERD (gastroesophageal reflux disease)     (EGD-Dr. Corinda Gubler)  . Atrophic gastritis  without mention of hemorrhage     (EGD-Dr. Corinda Gubler)  . History of radiation therapy     ALLERGIES:  is allergic to erythromycin and tetracycline.  MEDICATIONS:  Current Outpatient Prescriptions  Medication Sig Dispense Refill  . albuterol (PROVENTIL) (2.5 MG/3ML) 0.083% nebulizer solution Take 3 mLs (2.5 mg total) by nebulization every 6 (six) hours as needed for wheezing or shortness of breath.  60 vial  1  . fentaNYL (DURAGESIC - DOSED MCG/HR) 25 MCG/HR patch Place 1 patch (25 mcg total) onto the skin every 3 (three) days.  10 patch  0  . finasteride (PROSCAR) 5 MG tablet Take 5 mg by mouth at bedtime.       . furosemide (LASIX) 20 MG tablet Take 1 tablet (20 mg total) by mouth as needed. 20mg  as needed for swelling.  30 tablet  0  . levothyroxine (SYNTHROID, LEVOTHROID) 75 MCG tablet Take 75 mcg by mouth daily before breakfast.       . lidocaine-prilocaine (EMLA) cream Apply topically as needed.  30 g  0  . LORazepam (ATIVAN) 0.5 MG tablet Take 1 tablet (0.5 mg total) by mouth every 8 (eight) hours as needed for anxiety.  60 tablet  0  . LORazepam (ATIVAN) 1 MG tablet 1 tablet by mouth once for MRI  2 tablet  0  . oxyCODONE-acetaminophen (PERCOCET) 10-325 MG per tablet Take 1 tablet by mouth every 4 (four) hours as needed for pain.  30 tablet  0  . sertraline (ZOLOFT) 50 MG tablet       . terazosin (HYTRIN) 1 MG capsule Take 1 mg by mouth 2 (two) times daily.        Current Facility-Administered Medications  Medication Dose Route Frequency Provider Last Rate Last Dose  . TDaP (BOOSTRIX) injection 0.5 mL  0.5 mL Intramuscular Once Jacques Navy, MD        SURGICAL HISTORY:  Past Surgical History  Procedure Laterality Date  . Appendectomy    . Lumbar laminectomy    . Tonsillectomy    . Bilateral ganglionectomies    . Radical right neck dissection  2008  . Pci-rfa renal cell (tcc) cancer  2009    pt denies  . Cholecystectomy  dec. 2009  . Septoplasty  03/01/12    with double  turbinectomy  . Video bronchoscopy Bilateral 08/10/2012    Procedure: VIDEO BRONCHOSCOPY WITH FLUORO;  Surgeon: Oretha Milch, MD;  Location: [facility] ENDOSCOPY;  Service: Cardiopulmonary;  Laterality: Bilateral;  . Shoulder arthroscopy Left 2013  . Thoracentesis Right 10/2012    REVIEW OF SYSTEMS:  Constitutional: positive for fatigue and weight loss Eyes: negative Ears, nose, mouth, throat, and face: negative Respiratory: positive for dyspnea on exertion, pleurisy/chest pain and The sensation of not being able to breathe Cardiovascular: negative Gastrointestinal: positive for dysphagia Genitourinary:negative Integument/breast: negative Hematologic/lymphatic: negative Musculoskeletal:positive for back pain Neurological: negative Behavioral/Psych: positive for anxiety, bad mood, depression, irritability and mood swings Endocrine: negative Allergic/Immunologic: negative  PHYSICAL EXAMINATION: General appearance: alert, cooperative, fatigued and no distress Head: Normocephalic, without obvious abnormality, atraumatic Neck: no adenopathy, no JVD, supple, symmetrical, trachea midline and thyroid not enlarged, symmetric, no tenderness/mass/nodules Lymph nodes: Patient was not cooperative with the exam, therefore unable to discern Resp: dullness to percussion RLL Back: symmetric, no curvature. ROM normal. No CVA tenderness. Cardio: regular rate and rhythm, S1, S2 normal, no murmur, click, rub or gallop GI: Patient was not cooperative with exam, therefore unable to discern Extremities: Patient was not cooperative with exam, therefore unable to discern Neurologic: Grossly normal Right Pleurx catheter exit site uncertain, but there is no evidence of infection or irritation. Sutures are intact.   ECOG PERFORMANCE STATUS: 1 - Symptomatic but completely ambulatory  Blood pressure 134/48, pulse 68, temperature 98.2 F (36.8 C), temperature source Oral, resp. rate 17, height 6' (1.829 m), weight 194  lb 1.6 oz (88.043 kg), SpO2 97.00%.  pulse ox at rest 96-97% with a heart rate between 75 and 81 beats per minute, pulse ox with exertion 91-94% heart rate ranged between 98 and that 114 with one brief reading of 120-130 beats per minute then settling back down into the 98-100 beats per minute range. During is a entire process the patient did not exhibit any objective signs or symptoms of respiratory distress.  LABORATORY DATA: Lab Results  Component Value Date   WBC 6.5 12/29/2012   HGB 9.1* 12/29/2012   HCT 27.4* 12/29/2012   MCV 95.7 12/29/2012   PLT 282 12/29/2012      Chemistry      Component Value Date/Time   NA 138 12/29/2012 1420   NA 135 12/11/2012 1220   K 4.5 12/29/2012 1420   K 4.1 12/11/2012 1220   CL 99 12/11/2012 1220   CO2 28 12/29/2012 1420   CO2 23 12/11/2012 1220   BUN 11.4 12/29/2012 1420   BUN 17 12/11/2012 1220   CREATININE 0.9 12/29/2012 1420   CREATININE 0.99 12/11/2012 1220      Component Value Date/Time   CALCIUM 9.4 12/29/2012 1420   CALCIUM 9.4 12/11/2012 1220   ALKPHOS 100 12/29/2012 1420   ALKPHOS 76 05/13/2012 1005   AST 18 12/29/2012 1420   AST 16 05/13/2012 1005   ALT 11 12/29/2012 1420   ALT 11 05/13/2012 1005   BILITOT 0.24 12/29/2012 1420   BILITOT 0.5 05/13/2012 1005       RADIOGRAPHIC STUDIES: Ct Head W Wo Contrast  11/08/2012   CLINICAL DATA:  Small cell lung cancer. Visual changes with loss of balance.  EXAM: CT HEAD WITHOUT AND WITH CONTRAST  TECHNIQUE: Contiguous axial images were obtained from the base of the skull through the vertex without and with intravenous contrast  CONTRAST:  OMNIPAQUE IOHEXOL 300 MG/ML  SOLN  COMPARISON:  Head CT 08/17/2012. PET-CT 08/06/2012.  FINDINGS: There is subtle increased density in the subarachnoid space over the right frontal lobe on images 21 through 27. There is no abnormal enhancement in this area, although the cortical vessels appear medially displaced compared with the prior study,  suggesting a subdural process. There is no mass effect on the cortex or midline shift. There is no evidence of acute intracranial hemorrhage, mass lesion or abnormal intraparenchymal enhancement. There is no hydrocephalus.  The visualized paranasal sinuses are clear aside from chronic ethmoid sinus mucosal thickening. The mastoid air cells and middle ears are clear. The calvarium is intact.  IMPRESSION: New probable right frontal subdural process with minimally increased density, no  abnormal enhancement and no significant localized mass effect. This may reflect a subacute or chronic subdural hematoma. The lack of enhancement makes metastatic disease unlikely, and there is no evidence of acute intracranial hemorrhage. Leptomeningeal disease can be subtle by CT, and if that is a clinical concern, further evaluation with MRI or lumbar puncture should be considered.   Electronically Signed   By: Roxy Horseman M.D.   On: 11/08/2012 13:18   Ct Chest W Contrast  11/08/2012   CLINICAL DATA:  History a small cell lung cancer. Restaging scan.  EXAM: CT CHEST, ABDOMEN, AND PELVIS WITH CONTRAST  TECHNIQUE: Multidetector CT imaging of the chest, abdomen and pelvis was performed following the standard protocol during bolus administration of intravenous contrast.  CONTRAST:  OMNIPAQUE IOHEXOL 300 MG/ML  SOLN  COMPARISON:  CT of the chest, abdomen and pelvis 09/27/2012.  FINDINGS: CT CHEST FINDINGS  Mediastinum: Heart size is normal. There is no significant pericardial fluid, thickening or pericardial calcification. No pathologically enlarged mediastinal or hilar lymph nodes. Esophagus is unremarkable in appearance. Left-sided internal jugular single-lumen porta cath with tip terminating in the mid superior vena cava.  Lungs/Pleura: Extensive nodular pleural thickening is again noted throughout the right hemithorax, in association with a large right pleural effusion, presumably a malignant pleural effusion. There is a  pleural based mass like opacity in the inferior aspect of the right lower lobe, similar to numerous prior examinations, favored to represent an area of rounded atelectasis. In addition, there is a 6.4 x 2.4 cm mass centered in the right middle lobe, slightly larger than the prior examination (image 39 of series 5). Previously noted 7 mm left lower lobe nodule is also unchanged (image 39 of series 7). No definite new pulmonary nodules are otherwise identified. No acute consolidative airspace disease.  Musculoskeletal: There are no aggressive appearing lytic or blastic lesions noted in the visualized portions of the skeleton.  CT ABDOMEN AND PELVIS FINDINGS  Abdomen/Pelvis: Again noted are numerous low-attenuation hepatic lesions which appear well-defined and uniformly low attenuation. The largest of these are all compatible with simple cysts, while the smaller lesions are too small to definitively characterize. The largest single hepatic lesion is in central aspects of segments 7 and 8 measuring up to 8.6 x 6.7 cm. Status post cholecystectomy. The appearance of the pancreas, spleen and bilateral adrenal glands is unremarkable. Mild renal atrophy bilaterally. In the lateral aspect of the interpolar region of the left kidney there is a focal area of architectural distortion and calcifications, which appears to represent an old radiofrequency ablation defect.  Extensive atherosclerosis throughout the abdominal and pelvic vasculature, without definite aneurysm or dissection. No significant volume of ascites. No pneumoperitoneum. No pathologic distention of small bowel. Extensive colonic diverticulosis without findings to suggest acute diverticulitis at this time. No definite lymphadenopathy identified within the abdomen or pelvis. Prostate gland is severely enlarged measuring up to 6.5 x 5.2 cm. Urinary bladder is unremarkable in appearance.  Musculoskeletal: There are no aggressive appearing lytic or blastic lesions  noted in the visualized portions of the skeleton.    IMPRESSION: 1. Slight interval increase in size of right middle lobe mass. Right-sided malignant pleural effusion is very similar in a appearance to the prior examination. 2. Probable round atelectasis in the right lower lobe is unchanged. 3. 7 mm left lower lobe nodule is unchanged and remains nonspecific. 4. No definite signs of metastatic disease to the abdomen or pelvis. 5. Numerous low-attenuation hepatic lesions appears similar to  prior examinations. The largest of these are all compatible with simple cysts, while the smaller lesions are too small to definitively characterize. 6. Small calcified area in the lateral aspect of the interpolar region of the left kidney appears to represent scarring at site of old radiofrequency ablation, particularly when compared with remote prior examinations dating back to 12/17/2007. 7. Colonic diverticulosis without findings to suggest acute diverticulitis at this time.   Electronically Signed   By: Trudie Reed M.D.   On: 11/08/2012 13:42   ASSESSMENT AND PLAN: The patient is a 78 years old white male with history of extensive stage small cell lung cancer status post first cycle of systemic chemotherapy with carboplatin and etoposide with mild initial response and now with stable disease after cycle #4. The patient continues to complain of pain on the right side of the chest likely secondary to the right Pleurex catheter, the right pleural effusion as well as the transpleural tumor spread. The patient was discussed with also seen by Dr. Arbutus Ped. As stated above the appointment lasted approximately one hour and 20 minutes. Dr. Arbutus Ped had a long and very detailed conversation with the patient and his wife regarding his symptoms. We've recommended that he continue on his Ativan and increased the dose to 1 mg by mouth every 8 hours as needed for anxiety. He is to continue his Zoloft for depression. For pain management  he is to continue on his fentanyl 25 mcg patch and we will increase his Percocet to 10/325 mg tablets and he was given a prescription for the increased dosed of Percocet. For his perceived dyspnea we will give patient a trial of albuterol nebulized treatments, 1 every 6 hours as needed. Prescription for the medication as well as the nebulizer machine will be sent to [facility]. We will also contact [facility] about possible home health nurse to help with the dressings of his Pleurx catheter.   He is to followup as scheduled 02/10/2013 with repeat CT scan of the chest, abdomen and pelvis. CT scan of the head showed a questionable new right frontal subdural hematoma but this looks subacute/chronic in nature and the patient has no current symptoms. I recommended for him to continue on observation for now but he may need neurosurgical evaluation if he develops any symptoms. The patient and his wife agreed to the current plan.  He was advised to call immediately if he has any concerning symptoms in the interval. The patient voices understanding of current disease status and treatment options and is in agreement with the current care plan.  All questions were answered. The patient knows to call the clinic with any problems, questions or concerns. We can certainly see the patient much sooner if necessary.  Conni Slipper PA-C  ADDENDUM:  Hematology/Oncology Attending:  I had the face to face encounter with the patient. I recommended his care plan. This is a very extensive and long visit with the patient and his wife today. The patient is a 89 years old white male who was diagnosed with extensive stage small cell lung cancer status post systemic chemotherapy with carboplatin and etoposide with progressive disease on the right side of the chest. He was recently seen at [facility] by Dr. Okey Dupre and was considered for a clinical trial with Ipilumomab and  Nivolumab. He does not know if he is eligible for the clinical trial or not as he has not received any further information from [facility]. The  patient and his wife are very frustrated with all the medical communities and the patient is very anxious. He has a lot of panic attacks axial breast as shortness of breath but his oxygen saturation even with exercise was in the range of 94%. He was able to talk for more than 30-40 minutes without any significant shortness of breath. He had Pleurx catheter placed at [facility] and has daily drainage of the proximal catheter is a round 150 cc. His wife is concerned about the anxiety and panic attack but she doesn't want to give him the prescribed doses of Ativan and she tries to cut it even further down on her own. He is currently on 0.5 mg every 8 hours as needed.  I have a lengthy discussion with the patient and his wife today about his other options for treatment if he is not eligible for the clinical trial including systemic chemotherapy with either single agent topotecan or combination of reduced dose cisplatin and irinotecan. There are also offered the option of palliative care and hospice referral at the patient and his wife refused this option.  The patient and his wife are in denial for his poor prognosis.  The patient works as a Programmer, multimedia at [employer] and he told the people in his church recently that he would expect to be here for the next 20 years. I again reminded the patient and his wife of the poor prognosis of extensive stage small cell lung cancer but I also explained to the patient that I would not hold any palliative systemic treatment options as long he can tolerate it. For the panic attack, I increased his dose of Ativan to 1 mg by mouth every 8 hours as needed.  For pain management, he will continue on fentanyl patch 25 mg/hour every 3 days.  The patient would come back for followup visit as previously scheduled in January of 2015 with  repeat CT scan of the head, chest, abdomen and pelvis unless he is eligible for the clinical trial at [facility] as the patient will have his scan and follow up according to the clinical trial.  I can also see the patient sooner if he is not eligible for the trial for consideration of second line chemotherapy. The patient was advised to call immediately if he has any concerning symptoms in the interval. Lajuana Matte., MD 01/01/2013

## 2012-12-29 NOTE — Patient Instructions (Signed)
Implanted Port Instructions  An implanted port is a central line that has a round shape and is placed under the skin. It is used for long-term IV (intravenous) access for:  · Medicine.  · Fluids.  · Liquid nutrition, such as TPN (total parenteral nutrition).  · Blood samples.  Ports can be placed:  · In the chest area just below the collarbone (this is the most common place.)  · In the arms.  · In the belly (abdomen) area.  · In the legs.  PARTS OF THE PORT  A port has 2 main parts:  · The reservoir. The reservoir is round, disc-shaped, and will be a small, raised area under your skin.  · The reservoir is the part where a needle is inserted (accessed) to either give medicines or to draw blood.  · The catheter. The catheter is a long, slender tube that extends from the reservoir. The catheter is placed into a large vein.  · Medicine that is inserted into the reservoir goes into the catheter and then into the vein.  INSERTION OF THE PORT  · The port is surgically placed in either an operating room or in a procedural area (interventional radiology).  · Medicine may be given to help you relax during the procedure.  · The skin where the port will be inserted is numbed (local anesthetic).  · 1 or 2 small cuts (incisions) will be made in the skin to insert the port.  · The port can be used after it has been inserted.  INCISION SITE CARE  · The incision site may have small adhesive strips on it. This helps keep the incision site closed. Sometimes, no adhesive strips are placed. Instead of adhesive strips, a special kind of surgical glue is used to keep the incision closed.  · If adhesive strips were placed on the incision sites, do not take them off. They will fall off on their own.  · The incision site may be sore for 1 to 2 days. Pain medicine can help.  · Do not get the incision site wet. Bathe or shower as directed by your caregiver.  · The incision site should heal in 5 to 7 days. A small scar may form after the  incision has healed.  ACCESSING THE PORT  Special steps must be taken to access the port:  · Before the port is accessed, a numbing cream can be placed on the skin. This helps numb the skin over the port site.  · A sterile technique is used to access the port.  · The port is accessed with a needle. Only "non-coring" port needles should be used to access the port. Once the port is accessed, a blood return should be checked. This helps ensure the port is in the vein and is not clogged (clotted).  · If your caregiver believes your port should remain accessed, a clear (transparent) bandage will be placed over the needle site. The bandage and needle will need to be changed every week or as directed by your caregiver.  · Keep the bandage covering the needle clean and dry. Do not get it wet. Follow your caregiver's instructions on how to take a shower or bath when the port is accessed.  · If your port does not need to stay accessed, no bandage is needed over the port.  FLUSHING THE PORT  Flushing the port keeps it from getting clogged. How often the port is flushed depends on:  · If a   constant infusion is running. If a constant infusion is running, the port may not need to be flushed.  · If intermittent medicines are given.  · If the port is not being used.  For intermittent medicines:  · The port will need to be flushed:  · After medicines have been given.  · After blood has been drawn.  · As part of routine maintenance.  · A port is normally flushed with:  · Normal saline.  · Heparin.  · Follow your caregiver's advice on how often, how much, and the type of flush to use on your port.  IMPORTANT PORT INFORMATION  · Tell your caregiver if you are allergic to heparin.  · After your port is placed, you will get a manufacturer's information card. The card has information about your port. Keep this card with you at all times.  · There are many types of ports available. Know what kind of port you have.  · In case of an  emergency, it may be helpful to wear a medical alert bracelet. This can help alert health care workers that you have a port.  · The port can stay in for as long as your caregiver believes it is necessary.  · When it is time for the port to come out, surgery will be done to remove it. The surgery will be similar to how the port was put in.  · If you are in the hospital or clinic:  · Your port will be taken care of and flushed by a nurse.  · If you are at home:  · A home health care nurse may give medicines and take care of the port.  · You or a family member can get special training and directions for giving medicine and taking care of the port at home.  SEEK IMMEDIATE MEDICAL CARE IF:   · Your port does not flush or you are unable to get a blood return.  · New drainage or pus is coming from the incision.  · A bad smell is coming from the incision site.  · You develop swelling or increased redness at the incision site.  · You develop increased swelling or pain at the port site.  · You develop swelling or pain in the surrounding skin near the port.  · You have an oral temperature above 102° F (38.9° C), not controlled by medicine.  MAKE SURE YOU:   · Understand these instructions.  · Will watch your condition.  · Will get help right away if you are not doing well or get worse.  Document Released: 01/06/2005 Document Revised: 03/31/2011 Document Reviewed: 03/30/2008  [employer]® Patient Information ©2014 [employer].

## 2012-12-30 ENCOUNTER — Telehealth: Payer: Self-pay | Admitting: *Deleted

## 2012-12-30 ENCOUNTER — Telehealth: Payer: Self-pay | Admitting: Medical Oncology

## 2012-12-30 NOTE — Telephone Encounter (Signed)
Wife called asking why pt only got a weeks worth of percocet. I explained to [name] that the Oxycodone dose was increased yesterday and to start with this and see how often he needs it and let us know if it is more effective at controlling his pain.

## 2012-12-30 NOTE — Telephone Encounter (Signed)
[facility]  [phone])  And sent diagnosis of COPD to [facility]. Per Tiana Loft, PA

## 2012-12-30 NOTE — Patient Instructions (Signed)
Take Ativan 1 mg by mouth every 8 hours as needed for anxiety Continue your pain management with you current dose of fentanyl patch every 3 days and the increased dose of Percocet as needed Followup as previously scheduled with Dr. Arbutus Ped on 02/11/2012 with a restaging CT scan

## 2012-12-31 ENCOUNTER — Other Ambulatory Visit: Payer: Self-pay | Admitting: Physician Assistant

## 2012-12-31 ENCOUNTER — Telehealth: Payer: Self-pay | Admitting: *Deleted

## 2012-12-31 ENCOUNTER — Telehealth: Payer: Self-pay | Admitting: Medical Oncology

## 2012-12-31 NOTE — Telephone Encounter (Signed)
Spoke with patient's wife regarding prescription for albuterol nebulizer solution.  Medication was not able to be filled at [facility] because of insurance.  Asked patient's wife if they would like to get medication filled at [facility].   Patient's wife stated "that will be fine".  [facility] on [address].  Then called patient's wife back and informed her that patient's insurance information would need to be taken to pharmacy before they could fill prescription.  Patient's wife stated she would take information to pharmacy and then call me back to let me know if medication was able to be filled.

## 2012-12-31 NOTE — Telephone Encounter (Signed)
Patient's wife called regarding prescription for albuterol and stated she is just going to just get prescription filled at [facility] because [facility] is more expensive.

## 2012-12-31 NOTE — Telephone Encounter (Signed)
[facility] cannot provide albuterol because they are out of network.

## 2013-01-03 ENCOUNTER — Other Ambulatory Visit: Payer: Self-pay | Admitting: *Deleted

## 2013-01-03 DIAGNOSIS — C3490 Malignant neoplasm of unspecified part of unspecified bronchus or lung: Secondary | ICD-10-CM

## 2013-01-03 MED ORDER — LORAZEPAM 1 MG PO TABS: 1.0000 mg | ORAL_TABLET | Freq: Three times a day (TID) | ORAL | Status: DC | PRN

## 2013-01-04 ENCOUNTER — Telehealth: Payer: Self-pay | Admitting: *Deleted

## 2013-01-04 NOTE — Telephone Encounter (Signed)
[name] called wanting to know if patient needs to space out lorazepam and oxycodone.  Spoke with Lonna Cobb, NP, pt needs to try to space out the 2 medications at least 1-2 hours and avoid taking them at the same time.  SLJ

## 2013-01-05 ENCOUNTER — Other Ambulatory Visit: Payer: Self-pay | Admitting: *Deleted

## 2013-01-05 DIAGNOSIS — C3490 Malignant neoplasm of unspecified part of unspecified bronchus or lung: Secondary | ICD-10-CM

## 2013-01-05 MED ORDER — OXYCODONE-ACETAMINOPHEN 10-325 MG PO TABS: 1.0000 | ORAL_TABLET | ORAL | Status: DC | PRN

## 2013-01-05 MED ORDER — FENTANYL 50 MCG/HR TD PT72: 50.0000 ug | MEDICATED_PATCH | TRANSDERMAL | Status: DC

## 2013-01-05 NOTE — Telephone Encounter (Signed)
Pt's wife called stating that pt needs refill on his oxycodone 10/325.  He is still taking the same amount of oxycodone even though the dose has been increased from 5/325 to 10/325.  Per Dr Donnald Garre, ok to increase his fentanyl patch to , and will refill his oxycodone to a qty of 60 tablets.  Pt's wife verbalized understanding.  SLJ

## 2013-01-17 ENCOUNTER — Telehealth: Payer: Self-pay | Admitting: *Deleted

## 2013-01-17 ENCOUNTER — Telehealth: Payer: Self-pay | Admitting: Internal Medicine

## 2013-01-17 NOTE — Telephone Encounter (Signed)
Ok to prescribe nasonex 2 sprays per nostril once a day.

## 2013-01-17 NOTE — Telephone Encounter (Signed)
Late entry 01/14/13:    Judeth Cornfield from [facility] called wanting to know how frequently pleurex drain needs to be drained.  Per Dr Donnald Garre, pt had drain placed at [facility], orders need to come from them.  Left voicemail with information at # [phone].

## 2013-01-17 NOTE — Telephone Encounter (Signed)
Called requesting Nasonex refill.  Medication not on pts med list.  Please advise

## 2013-01-17 NOTE — Telephone Encounter (Signed)
s.w. pt and confirmed all Jan 2015 appt...pt ok and aware

## 2013-01-17 NOTE — Telephone Encounter (Signed)
[name] called requesting a refill for his nasonex.  Per Dr Donnald Garre, pt needs to f/u with PCP.  [name] also stated that pt got path report back from [facility] and he does have NSCLC.  Informed Dr Donnald Garre, he is awaiting to receive a progress note from [facility] and will then determine how to proceed.  SLJ

## 2013-01-18 MED ORDER — MOMETASONE FUROATE 50 MCG/ACT NA SUSP: 2.0000 | Freq: Every day | NASAL | Status: DC

## 2013-01-18 NOTE — Telephone Encounter (Signed)
Left message on VM, Rx sent

## 2013-01-19 ENCOUNTER — Other Ambulatory Visit: Payer: Self-pay | Admitting: *Deleted

## 2013-01-19 DIAGNOSIS — C3490 Malignant neoplasm of unspecified part of unspecified bronchus or lung: Secondary | ICD-10-CM

## 2013-01-19 MED ORDER — OXYCODONE-ACETAMINOPHEN 10-325 MG PO TABS: 1.0000 | ORAL_TABLET | ORAL | Status: DC | PRN

## 2013-01-19 MED ORDER — LORAZEPAM 1 MG PO TABS: 1.0000 mg | ORAL_TABLET | Freq: Three times a day (TID) | ORAL | Status: DC | PRN

## 2013-01-25 ENCOUNTER — Telehealth: Payer: Self-pay | Admitting: *Deleted

## 2013-01-25 NOTE — Telephone Encounter (Signed)
Colletta Maryland, with [facility], called regarding patient.  States patient is c/o nausea/constipation/increased pain.  She requests a callback for advise.  Please advise.   CB# [phone]

## 2013-01-25 NOTE — Telephone Encounter (Signed)
Notified Colletta Maryland of Dr. Linda Hedges' message via voicemail message.

## 2013-01-25 NOTE — Telephone Encounter (Signed)
Mr. Samuel Hurley has not been seen for some time but does follow with Dr. Julien Nordmann at the [facility] on a regular basis. Please direct Colletta Maryland to contact them or Mr. Samuel Hurley can be seen in the office.

## 2013-01-27 ENCOUNTER — Telehealth: Payer: Self-pay | Admitting: Internal Medicine

## 2013-01-27 ENCOUNTER — Telehealth: Payer: Self-pay

## 2013-01-27 ENCOUNTER — Telehealth: Payer: Self-pay | Admitting: *Deleted

## 2013-01-27 DIAGNOSIS — C3490 Malignant neoplasm of unspecified part of unspecified bronchus or lung: Secondary | ICD-10-CM

## 2013-01-27 NOTE — Telephone Encounter (Signed)
Phone call from patient's wife requesting a referral for consultation to [facility].

## 2013-01-27 NOTE — Telephone Encounter (Signed)
done

## 2013-01-27 NOTE — Telephone Encounter (Signed)
Phone call already addressed in a previous telephone encounter.  Patient wishes to be referred to the [facility] affiliated with [facility].Marland Kitchen

## 2013-01-27 NOTE — Telephone Encounter (Signed)
Faxed pt pathology reports to  [facility] in [facility] and slides will be fedex'ed

## 2013-01-27 NOTE — Telephone Encounter (Signed)
Patient is requesting a referral to [facility]. The wrong order was placed.

## 2013-01-27 NOTE — Telephone Encounter (Signed)
Order placed with [facility]

## 2013-01-28 ENCOUNTER — Ambulatory Visit: Payer: Medicare Other | Admitting: Internal Medicine

## 2013-01-28 ENCOUNTER — Other Ambulatory Visit: Payer: Medicare Other

## 2013-01-28 ENCOUNTER — Ambulatory Visit: Payer: Commercial Managed Care - HMO

## 2013-01-28 ENCOUNTER — Other Ambulatory Visit (HOSPITAL_BASED_OUTPATIENT_CLINIC_OR_DEPARTMENT_OTHER): Payer: Medicare HMO

## 2013-01-28 ENCOUNTER — Ambulatory Visit (HOSPITAL_BASED_OUTPATIENT_CLINIC_OR_DEPARTMENT_OTHER): Payer: Medicare HMO | Admitting: Internal Medicine

## 2013-01-28 ENCOUNTER — Encounter: Payer: Self-pay | Admitting: Internal Medicine

## 2013-01-28 VITALS — BP 113/50 | HR 70 | Temp 98.4°F | Resp 18 | Ht 72.0 in | Wt 192.6 lb

## 2013-01-28 DIAGNOSIS — C342 Malignant neoplasm of middle lobe, bronchus or lung: Secondary | ICD-10-CM

## 2013-01-28 DIAGNOSIS — C3491 Malignant neoplasm of unspecified part of right bronchus or lung: Secondary | ICD-10-CM

## 2013-01-28 DIAGNOSIS — C384 Malignant neoplasm of pleura: Secondary | ICD-10-CM

## 2013-01-28 DIAGNOSIS — Z95828 Presence of other vascular implants and grafts: Secondary | ICD-10-CM

## 2013-01-28 LAB — CBC WITH DIFFERENTIAL/PLATELET
BASO%: 1 % (ref 0.0–2.0)
Basophils Absolute: 0.1 10*3/uL (ref 0.0–0.1)
EOS ABS: 0 10*3/uL (ref 0.0–0.5)
EOS%: 0.9 % (ref 0.0–7.0)
HCT: 27.7 % — ABNORMAL LOW (ref 38.4–49.9)
HGB: 9 g/dL — ABNORMAL LOW (ref 13.0–17.1)
LYMPH%: 7.2 % — ABNORMAL LOW (ref 14.0–49.0)
MCH: 30.7 pg (ref 27.2–33.4)
MCHC: 32.5 g/dL (ref 32.0–36.0)
MCV: 94.4 fL (ref 79.3–98.0)
MONO#: 0.7 10*3/uL (ref 0.1–0.9)
MONO%: 13 % (ref 0.0–14.0)
NEUT#: 3.9 10*3/uL (ref 1.5–6.5)
NEUT%: 77.9 % — ABNORMAL HIGH (ref 39.0–75.0)
Platelets: 275 10*3/uL (ref 140–400)
RBC: 2.94 10*6/uL — AB (ref 4.20–5.82)
RDW: 16.4 % — AB (ref 11.0–14.6)
WBC: 5 10*3/uL (ref 4.0–10.3)
lymph#: 0.4 10*3/uL — ABNORMAL LOW (ref 0.9–3.3)

## 2013-01-28 LAB — COMPREHENSIVE METABOLIC PANEL (CC13)
ALBUMIN: 2.6 g/dL — AB (ref 3.5–5.0)
ALT: 8 U/L (ref 0–55)
AST: 13 U/L (ref 5–34)
Alkaline Phosphatase: 92 U/L (ref 40–150)
Anion Gap: 8 mEq/L (ref 3–11)
BUN: 13 mg/dL (ref 7.0–26.0)
CO2: 29 mEq/L (ref 22–29)
Calcium: 9 mg/dL (ref 8.4–10.4)
Chloride: 103 mEq/L (ref 98–109)
Creatinine: 1 mg/dL (ref 0.7–1.3)
GLUCOSE: 104 mg/dL (ref 70–140)
POTASSIUM: 4 meq/L (ref 3.5–5.1)
SODIUM: 140 meq/L (ref 136–145)
Total Bilirubin: 0.21 mg/dL (ref 0.20–1.20)
Total Protein: 6.6 g/dL (ref 6.4–8.3)

## 2013-01-28 MED ORDER — SODIUM CHLORIDE 0.9 % IJ SOLN
10.0000 mL | INTRAMUSCULAR | Status: DC | PRN
  Administered 2013-01-28: 10 mL via INTRAVENOUS
  Filled 2013-01-28: qty 10

## 2013-01-28 MED ORDER — HEPARIN SOD (PORK) LOCK FLUSH 100 UNIT/ML IV SOLN
500.0000 [IU] | Freq: Once | INTRAVENOUS | Status: AC
  Administered 2013-01-28: 500 [IU] via INTRAVENOUS
  Filled 2013-01-28: qty 5

## 2013-01-28 NOTE — Telephone Encounter (Signed)
Notified spouse of MD response

## 2013-01-28 NOTE — Patient Instructions (Signed)
Implanted Port Instructions  An implanted port is a central line that has a round shape and is placed under the skin. It is used for long-term IV (intravenous) access for:  · Medicine.  · Fluids.  · Liquid nutrition, such as TPN (total parenteral nutrition).  · Blood samples.  Ports can be placed:  · In the chest area just below the collarbone (this is the most common place.)  · In the arms.  · In the belly (abdomen) area.  · In the legs.  PARTS OF THE PORT  A port has 2 main parts:  · The reservoir. The reservoir is round, disc-shaped, and will be a small, raised area under your skin.  · The reservoir is the part where a needle is inserted (accessed) to either give medicines or to draw blood.  · The catheter. The catheter is a long, slender tube that extends from the reservoir. The catheter is placed into a large vein.  · Medicine that is inserted into the reservoir goes into the catheter and then into the vein.  INSERTION OF THE PORT  · The port is surgically placed in either an operating room or in a procedural area (interventional radiology).  · Medicine may be given to help you relax during the procedure.  · The skin where the port will be inserted is numbed (local anesthetic).  · 1 or 2 small cuts (incisions) will be made in the skin to insert the port.  · The port can be used after it has been inserted.  INCISION SITE CARE  · The incision site may have small adhesive strips on it. This helps keep the incision site closed. Sometimes, no adhesive strips are placed. Instead of adhesive strips, a special kind of surgical glue is used to keep the incision closed.  · If adhesive strips were placed on the incision sites, do not take them off. They will fall off on their own.  · The incision site may be sore for 1 to 2 days. Pain medicine can help.  · Do not get the incision site wet. Bathe or shower as directed by your caregiver.  · The incision site should heal in 5 to 7 days. A small scar may form after the  incision has healed.  ACCESSING THE PORT  Special steps must be taken to access the port:  · Before the port is accessed, a numbing cream can be placed on the skin. This helps numb the skin over the port site.  · A sterile technique is used to access the port.  · The port is accessed with a needle. Only "non-coring" port needles should be used to access the port. Once the port is accessed, a blood return should be checked. This helps ensure the port is in the vein and is not clogged (clotted).  · If your caregiver believes your port should remain accessed, a clear (transparent) bandage will be placed over the needle site. The bandage and needle will need to be changed every week or as directed by your caregiver.  · Keep the bandage covering the needle clean and dry. Do not get it wet. Follow your caregiver's instructions on how to take a shower or bath when the port is accessed.  · If your port does not need to stay accessed, no bandage is needed over the port.  FLUSHING THE PORT  Flushing the port keeps it from getting clogged. How often the port is flushed depends on:  · If a   constant infusion is running. If a constant infusion is running, the port may not need to be flushed.  · If intermittent medicines are given.  · If the port is not being used.  For intermittent medicines:  · The port will need to be flushed:  · After medicines have been given.  · After blood has been drawn.  · As part of routine maintenance.  · A port is normally flushed with:  · Normal saline.  · Heparin.  · Follow your caregiver's advice on how often, how much, and the type of flush to use on your port.  IMPORTANT PORT INFORMATION  · Tell your caregiver if you are allergic to heparin.  · After your port is placed, you will get a manufacturer's information card. The card has information about your port. Keep this card with you at all times.  · There are many types of ports available. Know what kind of port you have.  · In case of an  emergency, it may be helpful to wear a medical alert bracelet. This can help alert health care workers that you have a port.  · The port can stay in for as long as your caregiver believes it is necessary.  · When it is time for the port to come out, surgery will be done to remove it. The surgery will be similar to how the port was put in.  · If you are in the hospital or clinic:  · Your port will be taken care of and flushed by a nurse.  · If you are at home:  · A home health care nurse may give medicines and take care of the port.  · You or a family member can get special training and directions for giving medicine and taking care of the port at home.  SEEK IMMEDIATE MEDICAL CARE IF:   · Your port does not flush or you are unable to get a blood return.  · New drainage or pus is coming from the incision.  · A bad smell is coming from the incision site.  · You develop swelling or increased redness at the incision site.  · You develop increased swelling or pain at the port site.  · You develop swelling or pain in the surrounding skin near the port.  · You have an oral temperature above 102° F (38.9° C), not controlled by medicine.  MAKE SURE YOU:   · Understand these instructions.  · Will watch your condition.  · Will get help right away if you are not doing well or get worse.  Document Released: 01/06/2005 Document Revised: 03/31/2011 Document Reviewed: 03/30/2008  [employer]® Patient Information ©2014 [employer].

## 2013-01-29 DIAGNOSIS — C3491 Malignant neoplasm of unspecified part of right bronchus or lung: Secondary | ICD-10-CM | POA: Insufficient documentation

## 2013-01-29 NOTE — Progress Notes (Addendum)
[place] [phone]   [phone]  OFFICE VISIT PROGRESS NOTE  [name], MD [address] [place] [address]  DIAGNOSIS AND STAGE:  1) Poorly differentiated non-small cell lung cancer diagnosed in December of 2014 2) Extensive stage small cell lung cancer diagnosed in July 2014.  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 4)  History of oncocytic renal tumor status post radiofrequency ablation at [facility] in August of 2008.  PRIOR THERAPY: Systemic chemotherapy with carboplatin for AUC of 5 on day 1 and etoposide 120 mg/M2 on days 1, 2 and 3 with Neulasta support on day 4, status post 4 cycles. First dose was given on 08/18/2012.    CURRENT THERAPY: None  INTERVAL HISTORY: Samuel Hurley 79 y.o. male returns to the clinic today for followup visit accompanied by his wife Samuel Hurley. The patient is feeling much better today with no specific complaints except for the occasional right-sided chest pain and this is well-controlled with his current pain medication with fentanyl patch 50 mcg/hour every 3 days in addition to Percocet on as-needed basis. He takes around 2-3 Percocet on daily basis for breakthrough pain. His shortness of breath is much improved. The patient continues to have drainage from the Pleuryx catheter but this is down to less than 50 cc every 3 days and he is expected to have his Pleurx catheter removed and few weeks by Dr. Kerin Ransom at [facility]. He recently underwent biopsy of one of the right pleural-based nodule at [facility] and the final pathology was consistent with poorly differentiated non-small cell carcinoma. The tumor cells are of small size with hyperchromatic nuclei and some molded  forms. Many of the cells have abundant cytoplasm and nuclei with prominent nucleoli. Strong immunoreactivity is observed for anti-keratins and CK5/6, with negative staining observed for calretinin, WT-1, CK7, desmin, TTF-1 , Fli-1, CD99 and CD56. The entirety of these findings is most consistent with non-small carcinoma, with immunohistochemical evidence of rudimentary squamous differentiation. Bright pankeratin/ CK5/6 immunoreactivity with negative staining for TTF-1 and CD56 is not typical of small cell carcinoma.  He was seen recently by Dr. Sharlet Salina who sent him to be treated locally for the recently diagnosed non-small cell carcinoma as the patient is not a candidate for the clinical trial with Ipilumomab and Nivolumab for the second line option for the previously diagnosed small cell lung cancer. The patient was also seen recently at [facility] by Dr. Lewanda Rife who discussed within palliative systemic chemotherapy for his condition. The patient is not accepting the fact that he has an incurable condition and he still shopping around for other treatment options. He also requested a referral to the [facility]. He came today for evaluation and discussion of his treatment options after the recent diagnosis of non-small cell carcinoma. Today he denied having any other significant complaints. He has no significant shortness of breath, cough or hemoptysis. He denied having any significant weight loss or night sweats. He has no fever or chills. The patient denied having any nausea or vomiting or change in his bowel movement. He denied having any headache or visual changes.  MEDICAL HISTORY: Past Medical History  Diagnosis Date  . Hyperlipidemia   . OSA (obstructive sleep apnea)   .  BPH (benign prostatic hypertrophy)   . Kidney disease     TCC  . Xerostomia   . Pharynx cancer     squamous cell stage 4,s/p XRT,chemo, neck dissection  . Colon polyp     HYPERPLASTIC & TUBULAR  ADENOMA(Colonoscopy-Dr.Granjeno)  . Diverticulosis of colon (without mention of hemorrhage)     (Colonoscopy-Dr.Osgood)  . Internal hemorrhoids without mention of complication     (Colonoscopy-Dr.Lafayette)  . GERD (gastroesophageal reflux disease)     (EGD-Dr. Velora Heckler)  . Atrophic gastritis without mention of hemorrhage     (EGD-Dr. Velora Heckler)  . History of radiation therapy     ALLERGIES:  is allergic to erythromycin and tetracycline.  MEDICATIONS:  Current Outpatient Prescriptions  Medication Sig Dispense Refill  . albuterol (PROVENTIL) (2.5 MG/3ML) 0.083% nebulizer solution Take 3 mLs (2.5 mg total) by nebulization every 6 (six) hours as needed for wheezing or shortness of breath.  60 vial  1  . fentaNYL (DURAGESIC - DOSED MCG/HR) 50 MCG/HR Place 1 patch (50 mcg total) onto the skin every 3 (three) days.  10 patch  0  . finasteride (PROSCAR) 5 MG tablet Take 5 mg by mouth at bedtime.       . furosemide (LASIX) 20 MG tablet Take 1 tablet (20 mg total) by mouth as needed. 20mg  as needed for swelling.  30 tablet  0  . levothyroxine (SYNTHROID, LEVOTHROID) 75 MCG tablet Take 75 mcg by mouth daily before breakfast.       . lidocaine-prilocaine (EMLA) cream Apply topically as needed.  30 g  0  . LORazepam (ATIVAN) 1 MG tablet Take 1 tablet (1 mg total) by mouth every 8 (eight) hours as needed for anxiety.  30 tablet  0  . mometasone (NASONEX) 50 MCG/ACT nasal spray Place 2 sprays into the nose daily.  17 g  0  . oxyCODONE-acetaminophen (PERCOCET) 10-325 MG per tablet Take 1 tablet by mouth every 4 (four) hours as needed for pain.  60 tablet  0  . sertraline (ZOLOFT) 50 MG tablet       . terazosin (HYTRIN) 1 MG capsule Take 1 mg by mouth 2 (two) times daily.        Current Facility-Administered Medications  Medication Dose Route Frequency Provider Last Rate Last Dose  . TDaP (BOOSTRIX) injection 0.5 mL  0.5 mL Intramuscular Once Neena Rhymes, MD        SURGICAL HISTORY:  Past Surgical  History  Procedure Laterality Date  . Appendectomy    . Lumbar laminectomy    . Tonsillectomy    . Bilateral ganglionectomies    . Radical right neck dissection  2008  . Pci-rfa renal cell (tcc) cancer  2009    pt denies  . Cholecystectomy  dec. 2009  . Septoplasty  03/01/12    with double turbinectomy  . Video bronchoscopy Bilateral 08/10/2012    Procedure: VIDEO BRONCHOSCOPY WITH FLUORO;  Surgeon: Rigoberto Noel, MD;  Location: [facility];  Service: Cardiopulmonary;  Laterality: Bilateral;  . Shoulder arthroscopy Left 2013  . Thoracentesis Right 10/2012    REVIEW OF SYSTEMS:  Constitutional: negative Eyes: negative Ears, nose, mouth, throat, and face: negative Respiratory: positive for pleurisy/chest pain Cardiovascular: negative Gastrointestinal: positive for dysphagia Genitourinary:negative Integument/breast: negative Hematologic/lymphatic: negative Musculoskeletal:positive for back pain Neurological: negative Behavioral/Psych: positive for anxiety, bad mood, depression, irritability and mood swings Endocrine: negative Allergic/Immunologic: negative   PHYSICAL EXAMINATION: General appearance: alert, cooperative and no distress Head: Normocephalic, without obvious abnormality, atraumatic  Neck: no adenopathy and supple, symmetrical, trachea midline Lymph nodes: Cervical, supraclavicular, and axillary nodes normal. Resp: clear to auscultation bilaterally Back: symmetric, no curvature. ROM normal. No CVA tenderness. Cardio: regular rate and rhythm, S1, S2 normal, no murmur, click, rub or gallop GI: soft, non-tender; bowel sounds normal; no masses,  no organomegaly Extremities: extremities normal, atraumatic, no cyanosis or edema Neurologic: Alert and oriented X 3, normal strength and tone. Normal symmetric reflexes. Normal coordination and gait   ECOG PERFORMANCE STATUS: 1 - Symptomatic but completely ambulatory  Blood pressure 113/50, pulse 70, temperature 98.4 F (36.9  C), temperature source Oral, resp. rate 18, height 6' (1.829 m), weight 192 lb 9.6 oz (87.363 kg), SpO2 97.00%.  LABORATORY DATA: Lab Results  Component Value Date   WBC 5.0 01/28/2013   HGB 9.0* 01/28/2013   HCT 27.7* 01/28/2013   MCV 94.4 01/28/2013   PLT 275 01/28/2013      Chemistry      Component Value Date/Time   NA 140 01/28/2013 0926   NA 135 12/11/2012 1220   K 4.0 01/28/2013 0926   K 4.1 12/11/2012 1220   CL 99 12/11/2012 1220   CO2 29 01/28/2013 0926   CO2 23 12/11/2012 1220   BUN 13.0 01/28/2013 0926   BUN 17 12/11/2012 1220   CREATININE 1.0 01/28/2013 0926   CREATININE 0.99 12/11/2012 1220      Component Value Date/Time   CALCIUM 9.0 01/28/2013 0926   CALCIUM 9.4 12/11/2012 1220   ALKPHOS 92 01/28/2013 0926   ALKPHOS 76 05/13/2012 1005   AST 13 01/28/2013 0926   AST 16 05/13/2012 1005   ALT 8 01/28/2013 0926   ALT 11 05/13/2012 1005   BILITOT 0.21 01/28/2013 0926   BILITOT 0.5 05/13/2012 1005       RADIOGRAPHIC STUDIES:  ASSESSMENT AND PLAN: This is another very extensive and long visit with the patient and his wife today. The patient is a 39 years old white male who was diagnosed with extensive stage small cell lung cancer status post systemic chemotherapy with carboplatin and etoposide with progressive disease on the right side of the chest. He was recently seen at [facility] by Dr. Sharlet Salina and was considered for a clinical trial with Ipilumomab and Nivolumab.  Recent repeat biopsy of a right pleural based nodule showed poorly differentiated non-small cell carcinoma. I had a lengthy discussion with the patient and his wife that lasted more than an hour today regarding the new findings and treatment options. I shared with the patient and his wife the previous pathology report in July 2014 that was consistent with small cell carcinoma from the bronchoscopy performed by Dr. Elsworth Soho. The patient received the appropriate treatment for his condition at that time  with the standard chemotherapy with carboplatin and etoposide. He could have a poorly differentiated non-small cell lung cancer arising from the previously diagnosed small cell carcinoma or the patient could have 2 primaries. I explained to the patient that he still has incurable condition and any treatment would be of palliative nature. Dr. Kerin Ransom at [facility] sent the recent tissue block from the non-small cell lung cancer to be tested for molecular biomarkers, but these results are still pending.  I gave the patient several options for treatment of his condition including proceeding with systemic chemotherapy either with carboplatin and paclitaxel, carboplatin and Abraxane, carboplatin and gemcitabine versus palliative care and hospice referral versus awaiting the by marker testing to see if the patient  has any  Molecular mutations that he can benefit from target therapy. The patient is still reluctant about considering any treatment and he still in the process of shopping around for other options. He was seen so far at the [facility] [place] [facility] center and few days ago requested a referral to [facility].  At the conclusion of the visit the patient would like to wait until he knows about the results of the molecular biomarkers. I did not give the patient a follow up appointment as I will be awaiting to hear from him regarding his treatment decision. For the pain management, the patient will continue on his current pain medication with fentanyl patch and Percocet.  The patient was advised to call immediately if he has any concerning symptoms in the interval.  He was advised to call immediately if he has any concerning symptoms in the interval. The patient voices understanding of current disease status and treatment options and is in agreement with the current care plan.  All questions were answered. The patient  knows to call the clinic with any problems, questions or concerns. We can certainly see the patient much sooner if necessary. I spent 55 minutes on face-to-face counseling with the patient and his wife today of the total visit time of 65 minutes.  Eilleen Kempf., MD 01/29/2013  ADDENDUM: I spoke to Dr. Tillman Sers from [facility] pathology regarding the discrepancy in pathology for [name]. He indicated that the slides from the previous pathology diagnosed in July of 2014 from the right middle lobe bronchial brushing and washing were sent to [facility] his systems for review. The final pathology review from [facility], Case # [id number] on 11/20/2012 by Dr. Cherylynn Ridges confirmed the pathology report from [facility] pathology and it was consistent with small cell carcinoma.  It looks like the patient has 2 primaries with small cell carcinoma in the right middle lobe which was treated with 4 cycles of carboplatin and etoposide. He was also recently diagnosed with poorly differentiated non-small cell carcinoma involving the right pleural space. I am still awaiting to hear from him regarding when and where he would like to receive his treatment.

## 2013-01-29 NOTE — Patient Instructions (Signed)
Follow up visit once you make a decision regarding your treatment options

## 2013-02-02 ENCOUNTER — Other Ambulatory Visit: Payer: Self-pay | Admitting: Internal Medicine

## 2013-02-03 ENCOUNTER — Telehealth: Payer: Self-pay | Admitting: *Deleted

## 2013-02-03 NOTE — Telephone Encounter (Signed)
Pt's wife called stating that she wants someone to go over the molecular studies with her that she can view online.  Dr Vista Mink and Colletta Maryland, RN viewed care everywhere, did not see any molecular studies resulted at this time.  Called over to Dr Adair Patter office.  Spoke to Dr Adair Patter assistant who states that [name] has called them as well and is asking for the same results.  He has sent a message to Dr Kerin Ransom to call the patient.  Dr Kerin Ransom will call Dr Vista Mink as well.  Informed Dr Vista Mink.  SLJ

## 2013-02-03 NOTE — Telephone Encounter (Signed)
Pt phoned requesting pathology report from 2007.  Referred pt to medical records & provided telephone number. Verbalized understanding & appreciation

## 2013-02-04 ENCOUNTER — Telehealth: Payer: Self-pay | Admitting: Medical Oncology

## 2013-02-04 DIAGNOSIS — C3490 Malignant neoplasm of unspecified part of unspecified bronchus or lung: Secondary | ICD-10-CM

## 2013-02-04 MED ORDER — LORAZEPAM 1 MG PO TABS: 1.0000 mg | ORAL_TABLET | Freq: Three times a day (TID) | ORAL | Status: DC | PRN

## 2013-02-04 NOTE — Telephone Encounter (Signed)
Wife requested refill for ativan and to increase frequency . Per Dr Minda Meo Wynetta Emery I called in refill for every 8 hours. Wife notified.

## 2013-02-07 ENCOUNTER — Ambulatory Visit: Payer: Medicare Other | Admitting: Internal Medicine

## 2013-02-07 ENCOUNTER — Other Ambulatory Visit: Payer: Medicare Other

## 2013-02-07 ENCOUNTER — Telehealth: Payer: Self-pay | Admitting: Dietician

## 2013-02-07 NOTE — Telephone Encounter (Signed)
Brief Outpatient Oncology Nutrition Note  Patient has been identified to be at risk on malnutrition screen.  Wt Readings from Last 10 Encounters:  01/28/13 192 lb 9.6 oz (87.363 kg)  12/29/12 194 lb 1.6 oz (88.043 kg)  11/17/12 200 lb 3.2 oz (90.81 kg)  11/10/12 200 lb (90.719 kg)  10/20/12 207 lb 6.4 oz (94.076 kg)  09/29/12 203 lb 14.4 oz (92.488 kg)  09/08/12 205 lb 9.6 oz (93.26 kg)  08/25/12 206 lb 12.8 oz (93.804 kg)  08/13/12 202 lb 11.2 oz (91.944 kg)  08/02/12 207 lb (93.895 kg)  04/01/12 236.5 lbs   DIAGNOSIS AND STAGE:  1) Poorly differentiated non-small cell lung cancer diagnosed in December of 2014  2) Extensive stage small cell lung cancer diagnosed in July 2014.  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  4) History of oncocytic renal tumor status post radiofrequency ablation at [facility] in August of 2008.  Called patient due to continued weight loss (45 lbs since 04/01/12).  Patient referred me to his wife.  Wife and I had a long discussion about patient's current intake.  Patient with poor appetite, lack of saliva, and intolerance to milk.  Discussed options of appetite stimulants, ways to increase calories and protein in diet and symptom management.   Today's intake:  3 sausage links, [employer] sausage biscuit, pepsi, chicken and dumplings, asparagus and a salad.  Current intake is inadequate to maintain weight.    Wife and patient request that patient have a feeding tube placed.  He has had this the last time that he had cancer and feels that this would improve his quality of life.  Wife stated patient is palliative treatment/care.  Will discuss patient with the [place] RD.  Wife informed that RD is available as needed.  Recommended patient try  Ensure Clear (alone, with gingerale or true lemon).    Antonieta Iba, RD, LDN

## 2013-02-09 ENCOUNTER — Encounter: Payer: Self-pay | Admitting: *Deleted

## 2013-02-09 ENCOUNTER — Other Ambulatory Visit (HOSPITAL_COMMUNITY): Payer: Medicare Other

## 2013-02-09 ENCOUNTER — Other Ambulatory Visit: Payer: Self-pay | Admitting: Medical Oncology

## 2013-02-09 ENCOUNTER — Other Ambulatory Visit: Payer: Medicare Other

## 2013-02-09 DIAGNOSIS — C3490 Malignant neoplasm of unspecified part of unspecified bronchus or lung: Secondary | ICD-10-CM

## 2013-02-09 MED ORDER — OXYCODONE-ACETAMINOPHEN 10-325 MG PO TABS: 1.0000 | ORAL_TABLET | ORAL | Status: DC | PRN

## 2013-02-09 NOTE — Telephone Encounter (Signed)
Oxycodone rx locked in injection room

## 2013-02-09 NOTE — Progress Notes (Signed)
[facility] call to get record on pt.  I clarified with Tiffany in HIM that I can fax records.  She stated yes it is continuity of care and not a HIP PA concern.  I faxed to lung cancer navigator, Pam at [facility] with confirmation fax received.

## 2013-02-10 ENCOUNTER — Ambulatory Visit: Payer: Medicare Other | Admitting: Internal Medicine

## 2013-02-11 ENCOUNTER — Telehealth: Payer: Self-pay | Admitting: Medical Oncology

## 2013-02-11 ENCOUNTER — Other Ambulatory Visit: Payer: Self-pay | Admitting: Medical Oncology

## 2013-02-11 ENCOUNTER — Telehealth: Payer: Self-pay | Admitting: Internal Medicine

## 2013-02-11 NOTE — Telephone Encounter (Signed)
Faxed pt medical records to [facility]

## 2013-02-11 NOTE — Telephone Encounter (Signed)
Samuel Hurley left a message and requested appointment for [place] with Dr Julien Nordmann first of feb. I called back and left message to return my call.

## 2013-02-14 ENCOUNTER — Other Ambulatory Visit: Payer: Self-pay | Admitting: *Deleted

## 2013-02-14 ENCOUNTER — Telehealth: Payer: Self-pay | Admitting: Internal Medicine

## 2013-02-14 DIAGNOSIS — C3490 Malignant neoplasm of unspecified part of unspecified bronchus or lung: Secondary | ICD-10-CM

## 2013-02-14 MED ORDER — LORAZEPAM 1 MG PO TABS: 1.0000 mg | ORAL_TABLET | Freq: Three times a day (TID) | ORAL | Status: DC | PRN

## 2013-02-14 NOTE — Telephone Encounter (Signed)
Okay for refill?  

## 2013-02-14 NOTE — Telephone Encounter (Signed)
Definitely ok for prn refills.

## 2013-02-14 NOTE — Telephone Encounter (Addendum)
Patient's wife is calling on his behalf to request a refill on his Zoloft rx. He is receiving treatment for lung cancer from Dr. Julien Nordmann who asked them to call our office for this refill. She does not want them to have to come in for this prescription if possible. Patient uses [facility]. Please advise.

## 2013-02-14 NOTE — Telephone Encounter (Signed)
Per Dr Vista Mink, okay to schedule lab and f/u on Monday 02/21/13.  Michael aware of appts.  SLJ

## 2013-02-15 MED ORDER — SERTRALINE HCL 50 MG PO TABS: 50.0000 mg | ORAL_TABLET | Freq: Every day | ORAL | Status: DC

## 2013-02-15 NOTE — Telephone Encounter (Signed)
rx sent to pharmacy

## 2013-02-16 ENCOUNTER — Telehealth: Payer: Self-pay | Admitting: Nutrition

## 2013-02-16 NOTE — Telephone Encounter (Signed)
Contacted patient and wife by telephone.  Patient has been having weight loss and was referred for nutrition counseling.  I contacted and spoke with patient's wife, who reports that they understand how to eat.  Patient does not enjoy oral nutrition supplements.  She states they have requested a feeding tube placement through physician.  However, physician did not feel like this was a good option at this time.  Patient's wife declined nutrition appointment, but agrees to contact me if she has questions or concerns in the future.

## 2013-02-17 ENCOUNTER — Telehealth: Payer: Self-pay

## 2013-02-17 ENCOUNTER — Telehealth: Payer: Self-pay | Admitting: Internal Medicine

## 2013-02-17 NOTE — Telephone Encounter (Signed)
I called patient but spoke to his wife since there was a message left on triage about wanting to increase the Zoloft to 100 mg instead of 50 mg. Tues a script was called in for Zoloft 50 mg with PRN refills but she has not picked it up. Please advise on increase. Thanks.

## 2013-02-17 NOTE — Telephone Encounter (Signed)
OK for increase in zoloft to 100 mg. Please update med rec and send in new Rx. May want to call the pharmacy to let them know to cancel the renewal on the 50 mg zoloft.

## 2013-02-17 NOTE — Telephone Encounter (Signed)
Faxed pt medical records to [facility]. Slides and scans will be fedex'ed °

## 2013-02-18 ENCOUNTER — Telehealth: Payer: Self-pay | Admitting: Internal Medicine

## 2013-02-18 MED ORDER — SERTRALINE HCL 100 MG PO TABS: 100.0000 mg | ORAL_TABLET | Freq: Every day | ORAL | Status: DC

## 2013-02-18 NOTE — Telephone Encounter (Signed)
Received 6 pages from [facility], sent to Dr. Linda Hedges. 02/18/13/ss.

## 2013-02-18 NOTE — Telephone Encounter (Signed)
Done. Med list updated. Pharmacy notified. Patient notified.

## 2013-02-21 ENCOUNTER — Other Ambulatory Visit (HOSPITAL_BASED_OUTPATIENT_CLINIC_OR_DEPARTMENT_OTHER): Payer: Medicare HMO

## 2013-02-21 ENCOUNTER — Telehealth: Payer: Self-pay | Admitting: Internal Medicine

## 2013-02-21 ENCOUNTER — Ambulatory Visit (HOSPITAL_BASED_OUTPATIENT_CLINIC_OR_DEPARTMENT_OTHER): Payer: Medicare HMO | Admitting: Internal Medicine

## 2013-02-21 ENCOUNTER — Encounter: Payer: Self-pay | Admitting: Internal Medicine

## 2013-02-21 ENCOUNTER — Ambulatory Visit (HOSPITAL_BASED_OUTPATIENT_CLINIC_OR_DEPARTMENT_OTHER): Payer: Medicare HMO

## 2013-02-21 VITALS — BP 128/68 | HR 68 | Temp 98.4°F | Resp 18 | Ht 72.0 in | Wt 189.8 lb

## 2013-02-21 DIAGNOSIS — C3490 Malignant neoplasm of unspecified part of unspecified bronchus or lung: Secondary | ICD-10-CM

## 2013-02-21 DIAGNOSIS — D3A090 Benign carcinoid tumor of the bronchus and lung: Secondary | ICD-10-CM

## 2013-02-21 DIAGNOSIS — C3491 Malignant neoplasm of unspecified part of right bronchus or lung: Secondary | ICD-10-CM

## 2013-02-21 DIAGNOSIS — Z452 Encounter for adjustment and management of vascular access device: Secondary | ICD-10-CM

## 2013-02-21 DIAGNOSIS — C7A090 Malignant carcinoid tumor of the bronchus and lung: Secondary | ICD-10-CM

## 2013-02-21 LAB — COMPREHENSIVE METABOLIC PANEL (CC13)
ALT: 9 U/L (ref 0–55)
AST: 14 U/L (ref 5–34)
Albumin: 2.8 g/dL — ABNORMAL LOW (ref 3.5–5.0)
Alkaline Phosphatase: 91 U/L (ref 40–150)
Anion Gap: 9 mEq/L (ref 3–11)
BUN: 16.8 mg/dL (ref 7.0–26.0)
CO2: 29 mEq/L (ref 22–29)
Calcium: 9.6 mg/dL (ref 8.4–10.4)
Chloride: 102 mEq/L (ref 98–109)
Creatinine: 1 mg/dL (ref 0.7–1.3)
Glucose: 89 mg/dl (ref 70–140)
Potassium: 4.4 mEq/L (ref 3.5–5.1)
Sodium: 140 mEq/L (ref 136–145)
Total Protein: 7.1 g/dL (ref 6.4–8.3)

## 2013-02-21 LAB — CBC WITH DIFFERENTIAL/PLATELET
BASO%: 1.2 % (ref 0.0–2.0)
Basophils Absolute: 0.1 10*3/uL (ref 0.0–0.1)
EOS%: 3.8 % (ref 0.0–7.0)
Eosinophils Absolute: 0.2 10*3/uL (ref 0.0–0.5)
HCT: 28.7 % — ABNORMAL LOW (ref 38.4–49.9)
HGB: 9.5 g/dL — ABNORMAL LOW (ref 13.0–17.1)
LYMPH%: 14 % (ref 14.0–49.0)
MCH: 30.2 pg (ref 27.2–33.4)
MCHC: 32.9 g/dL (ref 32.0–36.0)
MCV: 91.7 fL (ref 79.3–98.0)
MONO#: 0.8 10*3/uL (ref 0.1–0.9)
MONO%: 14 % (ref 0.0–14.0)
NEUT#: 3.7 10*3/uL (ref 1.5–6.5)
NEUT%: 67 % (ref 39.0–75.0)
PLATELETS: 307 10*3/uL (ref 140–400)
RBC: 3.13 10*6/uL — AB (ref 4.20–5.82)
RDW: 15.8 % — ABNORMAL HIGH (ref 11.0–14.6)
WBC: 5.5 10*3/uL (ref 4.0–10.3)
lymph#: 0.8 10*3/uL — ABNORMAL LOW (ref 0.9–3.3)

## 2013-02-21 MED ORDER — SODIUM CHLORIDE 0.9 % IJ SOLN
10.0000 mL | INTRAMUSCULAR | Status: DC | PRN
  Administered 2013-02-21: 10 mL via INTRAVENOUS
  Filled 2013-02-21: qty 10

## 2013-02-21 MED ORDER — HEPARIN SOD (PORK) LOCK FLUSH 100 UNIT/ML IV SOLN
500.0000 [IU] | Freq: Once | INTRAVENOUS | Status: AC
  Administered 2013-02-21: 500 [IU] via INTRAVENOUS
  Filled 2013-02-21: qty 5

## 2013-02-21 NOTE — Patient Instructions (Signed)

## 2013-02-21 NOTE — Patient Instructions (Signed)
We discussed her treatment options including systemic chemotherapy with carboplatin and Abraxane. First cycle of chemotherapy on 02/25/2013. Followup visit in 2 weeks.

## 2013-02-21 NOTE — Telephone Encounter (Signed)
Gave pt appt for lab and and MD visit for February 2015 , emaikled michelle reagrding chemo, gave pt oral contrast for CT

## 2013-02-21 NOTE — Telephone Encounter (Signed)
Called pt, left message regarding appt on 2/6 lab and chemo advised pt to get appt calendar for February 2015

## 2013-02-21 NOTE — Progress Notes (Signed)
[place] [phone]   [phone]  OFFICE VISIT PROGRESS NOTE  Samuel Hare, MD [address] [place] [address]  DIAGNOSIS AND STAGE:  1) Poorly differentiated non-small cell lung cancer diagnosed in December of 2014 2) Extensive stage small cell lung cancer diagnosed in July 2014.  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 4)  History of oncocytic renal tumor status post radiofrequency ablation at [facility] in August of 2008.  PRIOR THERAPY: Systemic chemotherapy with carboplatin for AUC of 5 on day 1 and etoposide 120 mg/M2 on days 1, 2 and 3 with Neulasta support on day 4, status post 4 cycles. First dose was given on 08/18/2012.    CURRENT THERAPY: None  INTERVAL HISTORY: Samuel [facility] 79 y.o. male returns to the clinic today for followup visit accompanied by his wife [facility].   On 01/29/2013, The patient is feeling much better today with no specific complaints except for the occasional right-sided chest pain and this is well-controlled with his current pain medication with fentanyl patch 50 mcg/hour every 3 days in addition to Percocet on as-needed basis. He takes around 2-3 Percocet on daily basis for breakthrough pain. His shortness of breath is much improved. The patient continues to have drainage from the Pleuryx catheter but this is down to less than 50 cc every 3 days and he is expected to have his Pleurx catheter removed and few weeks by [facility] at [facility]. He recently underwent biopsy of one of the right pleural-based nodule at [facility] and the final pathology was consistent with poorly differentiated non-small cell carcinoma. The tumor cells are of small size with hyperchromatic nuclei  and some molded forms. Many of the cells have abundant cytoplasm and nuclei with prominent nucleoli. Strong immunoreactivity is observed for anti-keratins and CK5/6, with negative staining observed for calretinin, WT-1, CK7, desmin, TTF-1 , Fli-1, CD99 and CD56. The entirety of these findings is most consistent with non-small carcinoma, with immunohistochemical evidence of rudimentary squamous differentiation. Bright pankeratin/ CK5/6 immunoreactivity with negative staining for TTF-1 and CD56 is not typical of small cell carcinoma.  He was seen recently by Samuel [facility]ley who sent him to be treated locally for the recently diagnosed non-small cell carcinoma as the patient is not a candidate for the clinical trial with Ipilumomab and Nivolumab for the second line option for the previously diagnosed small cell lung cancer. The patient was also seen recently at [facility] by [facility] who discussed within palliative systemic chemotherapy for his condition. The patient is not accepting the fact that he has an incurable condition and he still shopping around for other treatment options. He also requested a referral to the [facility] [place] [facility]. He came today for evaluation and discussion of his treatment options after the recent diagnosis of non-small cell carcinoma.  On 02/21/2013, the patient present today for evaluation of his condition. He was seen by her medical oncologist in the last few weeks including [facility] at [facility] in [facility] as well as a Statistician at [facility] in [facility]. The visit records from [facility] are currently available to me and he is in agreement with the current evaluation  of Samuel [facility] regarding dealing with 2 different lung cancer including small cell lung cancer which was treated with carboplatin and etoposide in addition to recently diagnosed poorly differentiated non-small cell carcinoma.  He also concurs with the treatment options provided for Samuel [facility] including treatment with carboplatin and Abraxane. The patient is here today for evaluation and consideration of starting treatment for his newly diagnosed poorly differentiated non-small cell lung cancer. He is feeling much better today with no specific complaints except for mild fatigue. His Pleurx catheter drains less than 75 cc of pleural fluid every 3 days. The molecular biomarkers performed at [facility] were negative for EGFR mutation and the ALK gene translocation.  Today he denied having any other significant complaints. He has no significant shortness of breath, cough or hemoptysis. He denied having any significant weight loss or night sweats. He has no fever or chills. The patient denied having any nausea or vomiting or change in his bowel movement. He denied having any headache or visual changes.  MEDICAL HISTORY: Past Medical History  Diagnosis Date  . Hyperlipidemia   . OSA (obstructive sleep apnea)   . BPH (benign prostatic hypertrophy)   . Kidney disease     TCC  . Xerostomia   . Pharynx cancer     squamous cell stage 4,s/p XRT,chemo, neck dissection  . Colon polyp     HYPERPLASTIC & TUBULAR ADENOMA([facility])  . Diverticulosis of colon (without mention of hemorrhage)     ([facility])  . Internal hemorrhoids without mention of complication     ([facility])  . GERD (gastroesophageal reflux disease)     (EGD-Dr. Velora Heckler)  . Atrophic gastritis without mention of hemorrhage     (EGD-Dr. Velora [facility])  . History of radiation therapy     ALLERGIES:  is allergic to erythromycin and tetracycline.  MEDICATIONS:  Current Outpatient Prescriptions  Medication Sig Dispense Refill  . fentaNYL (DURAGESIC - DOSED MCG/HR) 50 MCG/HR Place 1 patch (50 mcg total) onto the skin every 3 (three) days.  10 patch  0  . finasteride (PROSCAR) 5 MG tablet Take 5 mg by mouth at bedtime.        . furosemide (LASIX) 20 MG tablet TAKE (1) TABLET DAILY AS NEEDED FOR SWELLING.  30 tablet  0  . levothyroxine (SYNTHROID, LEVOTHROID) 75 MCG tablet Take 75 mcg by mouth daily before breakfast.       . lidocaine-prilocaine (EMLA) cream Apply topically as needed.  30 g  0  . LORazepam (ATIVAN) 1 MG tablet Take 1 tablet (1 mg total) by mouth every 8 (eight) hours as needed for anxiety.  30 tablet  0  . mometasone (NASONEX) 50 MCG/ACT nasal spray Place 2 sprays into the nose daily.  17 g  0  . oxyCODONE-acetaminophen (PERCOCET) 10-325 MG per tablet Take 1 tablet by mouth every 4 (four) hours as needed for pain.  60 tablet  0  . sertraline (ZOLOFT) 100 MG tablet Take 1 tablet (100 mg total) by mouth daily.  30 tablet  prn  . terazosin (HYTRIN) 1 MG capsule Take 1 mg by mouth 2 (two) times daily.        Current Facility-Administered Medications  Medication Dose Route Frequency Provider Last Rate Last Dose  . TDaP (BOOSTRIX) injection 0.5 mL  0.5 mL Intramuscular Once Neena Rhymes, MD        SURGICAL HISTORY:  Past Surgical History  Procedure Laterality Date  . Appendectomy    . Lumbar laminectomy    .  Tonsillectomy    . Bilateral ganglionectomies    . Radical right neck dissection  2008  . Pci-rfa renal cell (tcc) cancer  2009    pt denies  . Cholecystectomy  dec. 2009  . Septoplasty  03/01/12    with double turbinectomy  . Video bronchoscopy Bilateral 08/10/2012    Procedure: VIDEO BRONCHOSCOPY WITH FLUORO;  Surgeon: Rigoberto Noel, MD;  Location: [facility];  Service: Cardiopulmonary;  Laterality: Bilateral;  . Shoulder arthroscopy Left 2013  . Thoracentesis Right 10/2012    REVIEW OF SYSTEMS:  Constitutional: negative Eyes: negative Ears, nose, mouth, throat, and face: negative Respiratory: positive for pleurisy/chest pain Cardiovascular: negative Gastrointestinal: positive for dysphagia Genitourinary:negative Integument/breast: negative Hematologic/lymphatic:  negative Musculoskeletal:positive for back pain Neurological: negative Behavioral/Psych: positive for anxiety, bad mood, depression, irritability and mood swings Endocrine: negative Allergic/Immunologic: negative   PHYSICAL EXAMINATION: General appearance: alert, cooperative and no distress Head: Normocephalic, without obvious abnormality, atraumatic Neck: no adenopathy and supple, symmetrical, trachea midline Lymph nodes: Cervical, supraclavicular, and axillary nodes normal. Resp: clear to auscultation bilaterally Back: symmetric, no curvature. ROM normal. No CVA tenderness. Cardio: regular rate and rhythm, S1, S2 normal, no murmur, click, rub or gallop GI: soft, non-tender; bowel sounds normal; no masses,  no organomegaly Extremities: extremities normal, atraumatic, no cyanosis or edema Neurologic: Alert and oriented X 3, normal strength and tone. Normal symmetric reflexes. Normal coordination and gait   ECOG PERFORMANCE STATUS: 1 - Symptomatic but completely ambulatory  Blood pressure 128/68, pulse 68, temperature 98.4 F (36.9 C), temperature source Oral, resp. rate 18, height 6' (1.829 m), weight 189 lb 12.8 oz (86.093 kg).  LABORATORY DATA: Lab Results  Component Value Date   WBC 5.5 02/21/2013   HGB 9.5* 02/21/2013   HCT 28.7* 02/21/2013   MCV 91.7 02/21/2013   PLT 307 02/21/2013      Chemistry      Component Value Date/Time   NA 140 02/21/2013 1003   NA 135 12/11/2012 1220   K 4.4 02/21/2013 1003   K 4.1 12/11/2012 1220   CL 99 12/11/2012 1220   CO2 29 02/21/2013 1003   CO2 23 12/11/2012 1220   BUN 16.8 02/21/2013 1003   BUN 17 12/11/2012 1220   CREATININE 1.0 02/21/2013 1003   CREATININE 0.99 12/11/2012 1220      Component Value Date/Time   CALCIUM 9.6 02/21/2013 1003   CALCIUM 9.4 12/11/2012 1220   ALKPHOS 91 02/21/2013 1003   ALKPHOS 76 05/13/2012 1005   AST 14 02/21/2013 1003   AST 16 05/13/2012 1005   ALT 9 02/21/2013 1003   ALT 11 05/13/2012 1005   BILITOT <0.20 02/21/2013 1003    BILITOT 0.5 05/13/2012 1005       RADIOGRAPHIC STUDIES:  ASSESSMENT AND PLAN: This is another very extensive and long visit with the patient and his wife today. The patient is a 79 years old white male who was diagnosed with extensive stage small cell lung cancer status post systemic chemotherapy with carboplatin and etoposide with progressive disease on the right side of the chest. He was recently seen at [facility] by [facility] and was considered for a clinical trial with Ipilumomab and Nivolumab.  Recent repeat biopsy of a right pleural based nodule showed poorly differentiated non-small cell carcinoma. I had a lengthy discussion with the patient and his wife that lasted more than 45 minutes today regarding the new findings and treatment options. After that many evaluation by a medical  oncologist in different institutions, the patient came to the conclusion that we are dealing with 2 types of lung cancer, small cell lung cancer which was treated with 4 cycles of systemic chemotherapy with carboplatin and etoposide and recently diagnosed poorly differentiated non-small cell lung cancer of the right pleural based nodules. I have a lengthy discussion with the patient and his wife today about his condition. I will complete the staging workup for his recently diagnosed non-small cell lung cancer by repeating CT scan of the head, chest, abdomen and pelvis before starting the first cycle of his chemotherapy. I recommended for the patient treatment with carboplatin for AUC of 5 on day 1 and Abraxane 100 mg/M2 on days 1, 8, and 15 every 3 weeks, for the recently diagnosed non-small cell lung cancer.  I discussed with the patient adverse effect of this treatment including but not limited to alopecia, myelosuppression, nausea and vomiting, peripheral neuropathy, liver or renal dysfunction. The patient has hand out about his chemotherapy. He is expected to start the first  cycle of this treatment later this week. He would come back for followup visit in 2 weeks for evaluation and management any adverse effect of his treatment. For the pain management, the patient will continue on his current pain medication with fentanyl patch and Percocet.  He was advised to call immediately if he has any concerning symptoms in the interval. The patient voices understanding of current disease status and treatment options and is in agreement with the current care plan.  All questions were answered. The patient knows to call the clinic with any problems, questions or concerns. We can certainly see the patient much sooner if necessary. I spent 35 minutes on face-to-face counseling with the patient and his wife today of the total visit time of 45 minutes.  Eilleen Kempf., MD 02/21/2013

## 2013-02-22 ENCOUNTER — Other Ambulatory Visit: Payer: Self-pay | Admitting: Internal Medicine

## 2013-02-23 ENCOUNTER — Encounter (HOSPITAL_COMMUNITY): Payer: Self-pay

## 2013-02-23 ENCOUNTER — Other Ambulatory Visit: Payer: Self-pay | Admitting: Internal Medicine

## 2013-02-23 ENCOUNTER — Ambulatory Visit (HOSPITAL_COMMUNITY)
Admission: RE | Admit: 2013-02-23 | Discharge: 2013-02-23 | Disposition: A | Discharge status: Home / Self Care | Payer: Medicare HMO | Source: Ambulatory Visit | Attending: Internal Medicine | Admitting: Internal Medicine

## 2013-02-23 DIAGNOSIS — R911 Solitary pulmonary nodule: Secondary | ICD-10-CM | POA: Insufficient documentation

## 2013-02-23 DIAGNOSIS — C3491 Malignant neoplasm of unspecified part of right bronchus or lung: Secondary | ICD-10-CM

## 2013-02-23 DIAGNOSIS — K5730 Diverticulosis of large intestine without perforation or abscess without bleeding: Secondary | ICD-10-CM | POA: Insufficient documentation

## 2013-02-23 DIAGNOSIS — C3490 Malignant neoplasm of unspecified part of unspecified bronchus or lung: Secondary | ICD-10-CM | POA: Insufficient documentation

## 2013-02-23 DIAGNOSIS — I700 Atherosclerosis of aorta: Secondary | ICD-10-CM | POA: Insufficient documentation

## 2013-02-23 DIAGNOSIS — N400 Enlarged prostate without lower urinary tract symptoms: Secondary | ICD-10-CM | POA: Insufficient documentation

## 2013-02-23 DIAGNOSIS — K7689 Other specified diseases of liver: Secondary | ICD-10-CM | POA: Insufficient documentation

## 2013-02-23 DIAGNOSIS — IMO0002 Reserved for concepts with insufficient information to code with codable children: Secondary | ICD-10-CM | POA: Insufficient documentation

## 2013-02-23 DIAGNOSIS — R599 Enlarged lymph nodes, unspecified: Secondary | ICD-10-CM | POA: Insufficient documentation

## 2013-02-23 DIAGNOSIS — D491 Neoplasm of unspecified behavior of respiratory system: Secondary | ICD-10-CM | POA: Insufficient documentation

## 2013-02-23 DIAGNOSIS — J90 Pleural effusion, not elsewhere classified: Secondary | ICD-10-CM | POA: Insufficient documentation

## 2013-02-23 MED ORDER — IOHEXOL 300 MG/ML  SOLN
100.0000 mL | Freq: Once | INTRAMUSCULAR | Status: AC | PRN
  Administered 2013-02-23: 100 mL via INTRAVENOUS

## 2013-02-25 ENCOUNTER — Other Ambulatory Visit: Payer: Commercial Managed Care - HMO

## 2013-02-25 ENCOUNTER — Other Ambulatory Visit: Payer: Self-pay | Admitting: Medical Oncology

## 2013-02-25 ENCOUNTER — Ambulatory Visit (HOSPITAL_BASED_OUTPATIENT_CLINIC_OR_DEPARTMENT_OTHER): Payer: Medicare HMO

## 2013-02-25 VITALS — BP 126/60 | HR 62 | Temp 98.5°F | Resp 16

## 2013-02-25 DIAGNOSIS — C342 Malignant neoplasm of middle lobe, bronchus or lung: Secondary | ICD-10-CM

## 2013-02-25 DIAGNOSIS — C3491 Malignant neoplasm of unspecified part of right bronchus or lung: Secondary | ICD-10-CM

## 2013-02-25 DIAGNOSIS — Z5111 Encounter for antineoplastic chemotherapy: Secondary | ICD-10-CM

## 2013-02-25 DIAGNOSIS — C3490 Malignant neoplasm of unspecified part of unspecified bronchus or lung: Secondary | ICD-10-CM

## 2013-02-25 MED ORDER — SODIUM CHLORIDE 0.9 % IJ SOLN
10.0000 mL | INTRAMUSCULAR | Status: DC | PRN
  Administered 2013-02-25: 10 mL
  Filled 2013-02-25: qty 10

## 2013-02-25 MED ORDER — SODIUM CHLORIDE 0.9 % IV SOLN
Freq: Once | INTRAVENOUS | Status: AC
  Administered 2013-02-25: 14:00:00 via INTRAVENOUS

## 2013-02-25 MED ORDER — ONDANSETRON 16 MG/50ML IVPB (CHCC)
INTRAVENOUS | Status: AC
  Filled 2013-02-25: qty 16

## 2013-02-25 MED ORDER — PACLITAXEL PROTEIN-BOUND CHEMO INJECTION 100 MG
100.0000 mg/m2 | Freq: Once | INTRAVENOUS | Status: AC
  Administered 2013-02-25: 200 mg via INTRAVENOUS
  Filled 2013-02-25: qty 40

## 2013-02-25 MED ORDER — HEPARIN SOD (PORK) LOCK FLUSH 100 UNIT/ML IV SOLN
500.0000 [IU] | Freq: Once | INTRAVENOUS | Status: AC | PRN
  Administered 2013-02-25: 500 [IU]
  Filled 2013-02-25: qty 5

## 2013-02-25 MED ORDER — CARBOPLATIN CHEMO INJECTION 600 MG/60ML
507.5000 mg | Freq: Once | INTRAVENOUS | Status: AC
  Administered 2013-02-25: 510 mg via INTRAVENOUS
  Filled 2013-02-25: qty 51

## 2013-02-25 MED ORDER — FENTANYL 50 MCG/HR TD PT72: 50.0000 ug | MEDICATED_PATCH | TRANSDERMAL | Status: DC

## 2013-02-25 MED ORDER — DEXAMETHASONE SODIUM PHOSPHATE 20 MG/5ML IJ SOLN
INTRAMUSCULAR | Status: AC
  Filled 2013-02-25: qty 5

## 2013-02-25 MED ORDER — DEXAMETHASONE SODIUM PHOSPHATE 20 MG/5ML IJ SOLN
20.0000 mg | Freq: Once | INTRAMUSCULAR | Status: AC
  Administered 2013-02-25: 20 mg via INTRAVENOUS

## 2013-02-25 MED ORDER — ONDANSETRON 16 MG/50ML IVPB (CHCC)
16.0000 mg | Freq: Once | INTRAVENOUS | Status: AC
  Administered 2013-02-25: 16 mg via INTRAVENOUS

## 2013-02-25 MED ORDER — OXYCODONE-ACETAMINOPHEN 10-325 MG PO TABS: 1.0000 | ORAL_TABLET | ORAL | Status: DC | PRN

## 2013-02-25 NOTE — Telephone Encounter (Signed)
Prescriptions given to Jan , RN

## 2013-02-25 NOTE — Patient Instructions (Signed)
[facility] Discharge Instructions for Patients Receiving Chemotherapy  Today you received the following chemotherapy agents abraxane and carboplatin.  To help prevent nausea and vomiting after your treatment, we encourage you to take your nausea medication zofran and ativan.   If you develop nausea and vomiting that is not controlled by your nausea medication, call the clinic.   BELOW ARE SYMPTOMS THAT SHOULD BE REPORTED IMMEDIATELY:  *FEVER GREATER THAN 100.5 F  *CHILLS WITH OR WITHOUT FEVER  NAUSEA AND VOMITING THAT IS NOT CONTROLLED WITH YOUR NAUSEA MEDICATION  *UNUSUAL SHORTNESS OF BREATH  *UNUSUAL BRUISING OR BLEEDING  TENDERNESS IN MOUTH AND THROAT WITH OR WITHOUT PRESENCE OF ULCERS  *URINARY PROBLEMS  *BOWEL PROBLEMS  UNUSUAL RASH Items with * indicate a potential emergency and should be followed up as soon as possible.  Feel free to call the clinic you have any questions or concerns. The clinic phone number is [phone].

## 2013-02-28 ENCOUNTER — Telehealth: Payer: Self-pay | Admitting: *Deleted

## 2013-02-28 ENCOUNTER — Telehealth: Payer: Self-pay | Admitting: Pulmonary Disease

## 2013-02-28 NOTE — Telephone Encounter (Signed)
Called pt at home for post chemo follow up call.  Spoke with pt and wife.  Wife stated she had called Dr. Worthy Flank office today for concerns of pt's situation and awaiting call back.  Desk nurse Colletta Maryland aware of situation and stated she would call pt back.

## 2013-02-28 NOTE — Telephone Encounter (Signed)
Pt's wife called wanting pt to have a PEG tube.  She states he needs one.  He has an appetite but has trouble swallowing.  She states he has esophogeal spasms.  He is weak and has lost 10 Ibs within 1 week.  Asked if he is able to drink ensure, she states that he does not like ensure.  Asked about ensure clear, she stated "that has not worked for him".  He is scared he is going to choke.    Per Dr Vista Mink, pt can have consultation with GI to evaluate the need for a PEG tube.    [name] was very upset and insisted that pt had 2 swallow studies last year and this should be sufficient for a PEG tube.  He will not go to another specialist and she insists that the swallow studies are enough.  Pt had a swallow study done last year in April, and it was recommended that he get an endoscopy at the time.  Dr. Sharlett Iles was assigned to do the endoscopy but it was not done because pt was d/c from Dr. Buel Ream practice.  Informed [name] that even though he had a swallow study he still needs to see a specialist to evaluate his swallowing and necessity of a PEG tube.  Stated that if patient was in distress he needs to go to the ED for evaluation.  Attempted to discuss with [name], she continued to interrupt while trying to explain what Dr Vista Mink has ordered.    Michael hung up the phone.    [name] called back, call was transferred to [facility] to further discuss issues.

## 2013-02-28 NOTE — Telephone Encounter (Signed)
Late Entry:  [facility] wanted to know if patient can have CT results from CT scan done last week.  Per Dr Vista Mink, pt can go to medical records and pick up scan results.  [facility] verbalized understanding.  SLJ

## 2013-02-28 NOTE — Telephone Encounter (Signed)
I spoke with the pt and advised he would need to contact the doctor that placed the tube to have it removed. Pt aware. [place] Bing, CMA

## 2013-03-03 ENCOUNTER — Telehealth: Payer: Self-pay

## 2013-03-03 MED ORDER — MEGESTROL ACETATE 40 MG/ML PO SUSP: ORAL | Status: DC

## 2013-03-03 NOTE — Telephone Encounter (Signed)
Phone call from [place] with [phone] states patient has a right chest tube and started chemo on Friday. He has no appetite and is wondering if something can be called to [facility]. Please advise.

## 2013-03-03 NOTE — Telephone Encounter (Signed)
Received a fax from [facility]. Megestrol 40 mg /ml needs prior authorization. The form has been filled out and submitted to [facility]

## 2013-03-03 NOTE — Telephone Encounter (Signed)
Megace 40 mg/ml, 10 cc dail for appetite, dispense 400 cc

## 2013-03-03 NOTE — Telephone Encounter (Signed)
[facility] has been notified and [facility] has been sent to [facility].

## 2013-03-03 NOTE — Telephone Encounter (Signed)
After PA was submitted I received and email back with a "favorable" response. Our office should be receiving a fax. In the meantime I let the pharmacy know via fax.

## 2013-03-04 ENCOUNTER — Ambulatory Visit (HOSPITAL_BASED_OUTPATIENT_CLINIC_OR_DEPARTMENT_OTHER): Payer: Medicare HMO

## 2013-03-04 ENCOUNTER — Other Ambulatory Visit: Payer: Self-pay | Admitting: *Deleted

## 2013-03-04 ENCOUNTER — Ambulatory Visit: Payer: Commercial Managed Care - HMO

## 2013-03-04 ENCOUNTER — Encounter: Payer: Self-pay | Admitting: Physician Assistant

## 2013-03-04 ENCOUNTER — Other Ambulatory Visit (HOSPITAL_BASED_OUTPATIENT_CLINIC_OR_DEPARTMENT_OTHER): Payer: Medicare HMO

## 2013-03-04 ENCOUNTER — Ambulatory Visit (HOSPITAL_BASED_OUTPATIENT_CLINIC_OR_DEPARTMENT_OTHER): Payer: Medicare HMO | Admitting: Physician Assistant

## 2013-03-04 VITALS — BP 123/55 | HR 72 | Temp 98.3°F

## 2013-03-04 VITALS — Ht 72.0 in | Wt 179.8 lb

## 2013-03-04 DIAGNOSIS — C3491 Malignant neoplasm of unspecified part of right bronchus or lung: Secondary | ICD-10-CM

## 2013-03-04 DIAGNOSIS — Z5111 Encounter for antineoplastic chemotherapy: Secondary | ICD-10-CM

## 2013-03-04 DIAGNOSIS — Z95828 Presence of other vascular implants and grafts: Secondary | ICD-10-CM

## 2013-03-04 DIAGNOSIS — C342 Malignant neoplasm of middle lobe, bronchus or lung: Secondary | ICD-10-CM

## 2013-03-04 DIAGNOSIS — C3490 Malignant neoplasm of unspecified part of unspecified bronchus or lung: Secondary | ICD-10-CM

## 2013-03-04 LAB — COMPREHENSIVE METABOLIC PANEL (CC13)
ALT: 12 U/L (ref 0–55)
ANION GAP: 9 meq/L (ref 3–11)
AST: 18 U/L (ref 5–34)
Albumin: 2.9 g/dL — ABNORMAL LOW (ref 3.5–5.0)
Alkaline Phosphatase: 98 U/L (ref 40–150)
BILIRUBIN TOTAL: 0.23 mg/dL (ref 0.20–1.20)
BUN: 15.3 mg/dL (ref 7.0–26.0)
CALCIUM: 9.2 mg/dL (ref 8.4–10.4)
CO2: 28 meq/L (ref 22–29)
Chloride: 102 mEq/L (ref 98–109)
Creatinine: 0.8 mg/dL (ref 0.7–1.3)
Glucose: 111 mg/dl (ref 70–140)
Potassium: 4 mEq/L (ref 3.5–5.1)
Sodium: 140 mEq/L (ref 136–145)
Total Protein: 6.9 g/dL (ref 6.4–8.3)

## 2013-03-04 LAB — CBC WITH DIFFERENTIAL/PLATELET
BASO%: 1 % (ref 0.0–2.0)
BASOS ABS: 0 10*3/uL (ref 0.0–0.1)
EOS%: 7.2 % — AB (ref 0.0–7.0)
Eosinophils Absolute: 0.4 10*3/uL (ref 0.0–0.5)
HEMATOCRIT: 30.3 % — AB (ref 38.4–49.9)
HEMOGLOBIN: 9.7 g/dL — AB (ref 13.0–17.1)
LYMPH%: 9.7 % — AB (ref 14.0–49.0)
MCH: 28.7 pg (ref 27.2–33.4)
MCHC: 32 g/dL (ref 32.0–36.0)
MCV: 89.6 fL (ref 79.3–98.0)
MONO#: 0.3 10*3/uL (ref 0.1–0.9)
MONO%: 6.6 % (ref 0.0–14.0)
NEUT#: 3.8 10*3/uL (ref 1.5–6.5)
NEUT%: 75.5 % — ABNORMAL HIGH (ref 39.0–75.0)
PLATELETS: 259 10*3/uL (ref 140–400)
RBC: 3.38 10*6/uL — ABNORMAL LOW (ref 4.20–5.82)
RDW: 15.9 % — ABNORMAL HIGH (ref 11.0–14.6)
WBC: 5 10*3/uL (ref 4.0–10.3)
lymph#: 0.5 10*3/uL — ABNORMAL LOW (ref 0.9–3.3)

## 2013-03-04 MED ORDER — PACLITAXEL PROTEIN-BOUND CHEMO INJECTION 100 MG
100.0000 mg/m2 | Freq: Once | INTRAVENOUS | Status: AC
  Administered 2013-03-04: 200 mg via INTRAVENOUS
  Filled 2013-03-04: qty 40

## 2013-03-04 MED ORDER — SODIUM CHLORIDE 0.9 % IV SOLN
Freq: Once | INTRAVENOUS | Status: AC
  Administered 2013-03-04: 15:00:00 via INTRAVENOUS

## 2013-03-04 MED ORDER — SODIUM CHLORIDE 0.9 % IJ SOLN
10.0000 mL | INTRAMUSCULAR | Status: DC | PRN
  Administered 2013-03-04: 10 mL via INTRAVENOUS
  Filled 2013-03-04: qty 10

## 2013-03-04 MED ORDER — ONDANSETRON 8 MG/NS 50 ML IVPB
INTRAVENOUS | Status: AC
Start: 2013-03-04 — End: 2013-03-04
  Filled 2013-03-04: qty 8

## 2013-03-04 MED ORDER — DEXAMETHASONE SODIUM PHOSPHATE 10 MG/ML IJ SOLN
INTRAMUSCULAR | Status: AC
  Filled 2013-03-04: qty 1

## 2013-03-04 MED ORDER — HEPARIN SOD (PORK) LOCK FLUSH 100 UNIT/ML IV SOLN
500.0000 [IU] | Freq: Once | INTRAVENOUS | Status: AC
  Administered 2013-03-04: 500 [IU] via INTRAVENOUS
  Filled 2013-03-04: qty 5

## 2013-03-04 MED ORDER — ONDANSETRON 8 MG/50ML IVPB (CHCC)
8.0000 mg | Freq: Once | INTRAVENOUS | Status: AC
  Administered 2013-03-04: 8 mg via INTRAVENOUS

## 2013-03-04 MED ORDER — SODIUM CHLORIDE 0.9 % IJ SOLN
10.0000 mL | INTRAMUSCULAR | Status: DC | PRN
  Administered 2013-03-04: 10 mL
  Filled 2013-03-04: qty 10

## 2013-03-04 MED ORDER — HEPARIN SOD (PORK) LOCK FLUSH 100 UNIT/ML IV SOLN
500.0000 [IU] | Freq: Once | INTRAVENOUS | Status: AC | PRN
  Administered 2013-03-04: 500 [IU]
  Filled 2013-03-04: qty 5

## 2013-03-04 MED ORDER — LORAZEPAM 1 MG PO TABS
1.0000 mg | ORAL_TABLET | Freq: Three times a day (TID) | ORAL | Status: DC | PRN
Start: 2013-03-04 — End: 2013-03-17

## 2013-03-04 MED ORDER — DEXAMETHASONE SODIUM PHOSPHATE 10 MG/ML IJ SOLN
10.0000 mg | Freq: Once | INTRAMUSCULAR | Status: AC
  Administered 2013-03-04: 10 mg via INTRAVENOUS

## 2013-03-04 NOTE — Progress Notes (Signed)
[facility] [phone]   [phone]  OFFICE VISIT PROGRESS NOTE  Samuel Hare, MD [address] [place] [address]  DIAGNOSIS AND STAGE:  1) Poorly differentiated non-small cell lung cancer diagnosed in December of 2014 2) Extensive stage small cell lung cancer diagnosed in July 2014.  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 4)  History of oncocytic renal tumor status post radiofrequency ablation at [facility] in August of 2008.  PRIOR THERAPY: Systemic chemotherapy with carboplatin for AUC of 5 on day 1 and etoposide 120 mg/M2 on days 1, 2 and 3 with Neulasta support on day 4, status post 4 cycles. First dose was given on 08/18/2012.    CURRENT THERAPY: Systemic chemotherapy with carboplatin for an AUC of 5 given on day 1 and Abraxane 100 mg per meter squared given on days 1, 8 and 15 every 3 weeks for the recently diagnosed non-small cell lung cancer. Status post day 1 of cycle 1  INTERVAL HISTORY: Samuel Hurley 79 y.o. male returns to the clinic today for followup visit accompanied by his wife [name]. Patient states that he had a very rough time with the day 1 of cycle 1 of his systemic chemotherapy with carboplatin and Abraxane. He reports a fever with MAXIMUM TEMPERATURE of 103.8 but did not go to the emergency room. He reports no other such high fevers. He's had issues with constipation which he feels is related to the oxycodone. He discontinued the oxycodone but in the continues to use the fentanyl patches with reasonable pain relief. He recently has had 2 very good bowel movements after discontinuing the oxycodone. He's had no appetite and has lost 10 pounds. He continues to have difficulty swallowing. At this juncture he would  like to wait on a referral to gastroenterology. He will try milkshakes and other soft foods. He requests a refill for his Ativan. He states that it the way that he is felt after the first day of Carbo Abraxane has been the worst compared to all of his cancer treatment thus far. Today he feels reasonably well and is ready to proceed with day 8 of cycle #1 of his systemic chemotherapy with carboplatin and Abraxane.  The molecular biomarkers performed at [facility] were negative for EGFR mutation and the ALK gene translocation.  Today he denied having any other significant complaints. He has no significant shortness of breath, cough or hemoptysis. He denied having any night sweats. The patient denied having any nausea or vomiting or change in his bowel movement. He denied having any headache or visual changes.  MEDICAL HISTORY: Past Medical History  Diagnosis Date  . Hyperlipidemia   . OSA (obstructive sleep apnea)   . BPH (benign prostatic hypertrophy)   . Kidney disease     TCC  . Xerostomia   . Pharynx cancer     squamous cell stage 4,s/p XRT,chemo, neck dissection  . Colon polyp     HYPERPLASTIC & TUBULAR ADENOMA(Colonoscopy-Dr.[place])  . Diverticulosis of colon (without mention of hemorrhage)     (Colonoscopy-Dr.[place])  . Internal hemorrhoids without mention of complication     (Colonoscopy-Dr.[place])  . GERD (gastroesophageal reflux disease)     (EGD-Dr. Velora Heckler)  . Atrophic gastritis without mention of hemorrhage     (  EGD-Dr. Velora Heckler)  . History of radiation therapy     ALLERGIES:  is allergic to erythromycin and tetracycline.  MEDICATIONS:  Current Outpatient Prescriptions  Medication Sig Dispense Refill  . fentaNYL (DURAGESIC - DOSED MCG/HR) 50 MCG/HR Place 1 patch (50 mcg total) onto the skin every 3 (three) days.  10 patch  0  . finasteride (PROSCAR) 5 MG tablet Take 5 mg by mouth at bedtime.       . furosemide (LASIX) 20 MG tablet TAKE (1) TABLET DAILY AS NEEDED  FOR SWELLING.  30 tablet  0  . levothyroxine (SYNTHROID, LEVOTHROID) 75 MCG tablet Take 75 mcg by mouth daily before breakfast.       . LORazepam (ATIVAN) 1 MG tablet Take 1 tablet (1 mg total) by mouth every 8 (eight) hours as needed for anxiety.  30 tablet  0  . NASONEX 50 MCG/ACT nasal spray PLACE 2 SPRAYS INTO EACH NOSTRIL DAILY.  17 g  11  . oxyCODONE-acetaminophen (PERCOCET) 10-325 MG per tablet Take 1 tablet by mouth every 4 (four) hours as needed for pain.  60 tablet  0  . terazosin (HYTRIN) 1 MG capsule Take 1 mg by mouth 2 (two) times daily.       Marland Kitchen lidocaine-prilocaine (EMLA) cream Apply topically as needed.  30 g  0  . megestrol (MEGACE ORAL) 40 MG/ML suspension Take 10 cc daily for appetite  400 mL  0  . sertraline (ZOLOFT) 100 MG tablet Take 1 tablet (100 mg total) by mouth daily.  30 tablet  prn   Current Facility-Administered Medications  Medication Dose Route Frequency Provider Last Rate Last Dose  . TDaP (BOOSTRIX) injection 0.5 mL  0.5 mL Intramuscular Once Neena Rhymes, MD       Facility-Administered Medications Ordered in Other Visits  Medication Dose Route Frequency Provider Last Rate Last Dose  . heparin lock flush 100 unit/mL  500 Units Intracatheter Once PRN Curt Bears, MD      . PACLitaxel-protein bound (ABRAXANE) chemo infusion 200 mg  100 mg/m2 (Treatment Plan Actual) Intravenous Once Curt Bears, MD      . sodium chloride 0.9 % injection 10 mL  10 mL Intracatheter PRN Curt Bears, MD        SURGICAL HISTORY:  Past Surgical History  Procedure Laterality Date  . Appendectomy    . Lumbar laminectomy    . Tonsillectomy    . Bilateral ganglionectomies    . Radical right neck dissection  2008  . Pci-rfa renal cell (tcc) cancer  2009    pt denies  . Cholecystectomy  dec. 2009  . Septoplasty  03/01/12    with double turbinectomy  . Video bronchoscopy Bilateral 08/10/2012    Procedure: VIDEO BRONCHOSCOPY WITH FLUORO;  Surgeon: Rigoberto Noel, MD;   Location: [facility];  Service: Cardiopulmonary;  Laterality: Bilateral;  . Shoulder arthroscopy Left 2013  . Thoracentesis Right 10/2012    REVIEW OF SYSTEMS:  Constitutional: positive for anorexia, fatigue, fevers, malaise and weight loss Eyes: negative Ears, nose, mouth, throat, and face: negative Respiratory: positive for pleurisy/chest pain Cardiovascular: negative Gastrointestinal: positive for dysphagia Genitourinary:negative Integument/breast: negative Hematologic/lymphatic: negative Musculoskeletal:positive for back pain Neurological: negative Behavioral/Psych: positive for anxiety, bad mood, depression, irritability and mood swings Endocrine: negative Allergic/Immunologic: negative   PHYSICAL EXAMINATION: General appearance: alert, cooperative and no distress Head: Normocephalic, without obvious abnormality, atraumatic Neck: no adenopathy and supple, symmetrical, trachea midline Lymph nodes: Cervical, supraclavicular, and axillary nodes normal. Resp: clear to  auscultation bilaterally Back: symmetric, no curvature. ROM normal. No CVA tenderness. Cardio: regular rate and rhythm, S1, S2 normal, no murmur, click, rub or gallop GI: soft, non-tender; bowel sounds normal; no masses,  no organomegaly Extremities: extremities normal, atraumatic, no cyanosis or edema Neurologic: Alert and oriented X 3, normal strength and tone. Normal symmetric reflexes. Normal coordination and gait   ECOG PERFORMANCE STATUS: 1 - Symptomatic but completely ambulatory  Height 6' (1.829 m), weight 179 lb 12.8 oz (81.557 kg). Weight down 10 pounds from 02/21/2013  LABORATORY DATA: Lab Results  Component Value Date   WBC 5.0 03/04/2013   HGB 9.7* 03/04/2013   HCT 30.3* 03/04/2013   MCV 89.6 03/04/2013   PLT 259 03/04/2013      Chemistry      Component Value Date/Time   NA 140 03/04/2013 1320   NA 135 12/11/2012 1220   K 4.0 03/04/2013 1320   K 4.1 12/11/2012 1220   CL 99 12/11/2012 1220    CO2 28 03/04/2013 1320   CO2 23 12/11/2012 1220   BUN 15.3 03/04/2013 1320   BUN 17 12/11/2012 1220   CREATININE 0.8 03/04/2013 1320   CREATININE 0.99 12/11/2012 1220      Component Value Date/Time   CALCIUM 9.2 03/04/2013 1320   CALCIUM 9.4 12/11/2012 1220   ALKPHOS 98 03/04/2013 1320   ALKPHOS 76 05/13/2012 1005   AST 18 03/04/2013 1320   AST 16 05/13/2012 1005   ALT 12 03/04/2013 1320   ALT 11 05/13/2012 1005   BILITOT 0.23 03/04/2013 1320   BILITOT 0.5 05/13/2012 1005       RADIOGRAPHIC STUDIES: Ct Head W Wo Contrast  02/23/2013   CLINICAL DATA:  Non-small-cell lung cancer.  EXAM: CT HEAD WITHOUT AND WITH CONTRAST  TECHNIQUE: Contiguous axial images were obtained from the base of the skull through the vertex without and with intravenous contrast  CONTRAST:  139m OMNIPAQUE IOHEXOL 300 MG/ML  SOLN  COMPARISON:  CT head 08/17/2012  FINDINGS: Ventricle size is normal. Negative for infarct mass or edema. No intracranial hemorrhage.  Postcontrast imaging are normal is normal. No enhancing mass lesion is identified.  No skull lesion identified.  No change from the prior study.  IMPRESSION: No significant abnormality.  Negative for metastatic disease.   Electronically Signed   By: CFranchot GalloM.D.   On: 02/23/2013 13:37   Ct Chest W Contrast  02/23/2013   CLINICAL DATA:  Lung cancer  EXAM: CT CHEST, ABDOMEN, AND PELVIS WITH CONTRAST  TECHNIQUE: Multidetector CT imaging of the chest, abdomen and pelvis was performed following the standard protocol during bolus administration of intravenous contrast.  CONTRAST:  109mOMNIPAQUE IOHEXOL 300 MG/ML  SOLN  COMPARISON:  11/08/2012  FINDINGS: CT CHEST FINDINGS  Right middle lobe lung mass measures 6 cm, image 36/series 5. Previously this measured 6.4 cm. New central component involving the right hilum measures 3.3 cm, image 35/ series 5. Previously this measured 2 cm. There is a large loculated right pleural effusion present. A chest tube has been placed.  The volume of the loculated effusion is not significantly changed from previous exam. Extensive pleural tumor within the right hemi thorax is identified. Within the base of the right hemi thorax the pleural tumor measures up to 4.3 cm in maximum thickness. This is compared with 3.8 cm previously. Rounded atelectasis within the right lower lobe appears similar to the previous exam. Within the left lower lobe there is a nodule measuring 0.9 cm, image  37/series 9. Previously this measured 0.7 cm.  The heart size appears normal. There is no pericardial effusion identified. There is a a right paratracheal lymph node which now measures 1.3 cm, image 27/series 5. On the previous exam this measured 0.2 cm. There is a right hilar lymph node which measures 1.3 cm, image 32/series 5. Previously this measured 0.7 cm. Lymph node within the right cardiophrenic angle measures 1.1 cm, image 46/series 5. This is compared with 0.6 cm previously. No axillary or supraclavicular adenopathy identified.  CT ABDOMEN AND PELVIS FINDINGS  Multiple liver cysts are identified. The patient is status post cholecystectomy. Mild increased caliber of the common bile duct which measures up to 1.1 cm. No obstructing stone or mass noted. Normal appearance of the pancreas. The spleen is unremarkable. The adrenal glands both appear normal. The right kidney is normal. Scarring is identified involving the left kidney. There is marked enlargement of the prostate gland which has mass effect on the base of the bladder.  Calcified atherosclerotic disease affects the abdominal aorta. No aneurysm. There is no upper abdominal adenopathy identified. No pelvic or inguinal adenopathy identified. There is no free fluid or fluid collections within the abdomen or pelvis. No peritoneal nodule or mass identified.  The stomach appears normal. The small bowel loops appear within normal limits. Multiple distal colonic diverticula are present. No acute inflammation.  Review  of the visualized bony structures is significant for mild thoracic and lumbar degenerative disc disease. No aggressive lytic or sclerotic bone lesions identified.  IMPRESSION: CT chest:  1. Overall interval increase in tumor involving the chest. 2. Progression of trans pleural spread of tumor involving the right hemi thorax. 3. Progression of right hilar and mediastinal adenopathy.  . 4. Persistent loculated right pleural effusion which is not significantly changed in volume from previous exam status post chest tube placement. 5. Pulmonary nodule of left lower lobe is slightly increased in size from previous exam. CT abdomen pelvis:  1. No acute findings identified within the upper abdomen. No evidence for metastatic disease. 2. Liver cysts. 3. Atherosclerotic disease 4. Prostate gland enlargement.   Electronically Signed   By: Kerby Moors M.D.   On: 02/23/2013 13:57   Ct Abdomen Pelvis W Contrast  02/23/2013   CLINICAL DATA:  Lung cancer  EXAM: CT CHEST, ABDOMEN, AND PELVIS WITH CONTRAST  TECHNIQUE: Multidetector CT imaging of the chest, abdomen and pelvis was performed following the standard protocol during bolus administration of intravenous contrast.  CONTRAST:  129m OMNIPAQUE IOHEXOL 300 MG/ML  SOLN  COMPARISON:  11/08/2012  FINDINGS: CT CHEST FINDINGS  Right middle lobe lung mass measures 6 cm, image 36/series 5. Previously this measured 6.4 cm. New central component involving the right hilum measures 3.3 cm, image 35/ series 5. Previously this measured 2 cm. There is a large loculated right pleural effusion present. A chest tube has been placed. The volume of the loculated effusion is not significantly changed from previous exam. Extensive pleural tumor within the right hemi thorax is identified. Within the base of the right hemi thorax the pleural tumor measures up to 4.3 cm in maximum thickness. This is compared with 3.8 cm previously. Rounded atelectasis within the right lower lobe appears similar to  the previous exam. Within the left lower lobe there is a nodule measuring 0.9 cm, image 37/series 9. Previously this measured 0.7 cm.  The heart size appears normal. There is no pericardial effusion identified. There is a a right paratracheal lymph node which  now measures 1.3 cm, image 27/series 5. On the previous exam this measured 0.2 cm. There is a right hilar lymph node which measures 1.3 cm, image 32/series 5. Previously this measured 0.7 cm. Lymph node within the right cardiophrenic angle measures 1.1 cm, image 46/series 5. This is compared with 0.6 cm previously. No axillary or supraclavicular adenopathy identified.  CT ABDOMEN AND PELVIS FINDINGS  Multiple liver cysts are identified. The patient is status post cholecystectomy. Mild increased caliber of the common bile duct which measures up to 1.1 cm. No obstructing stone or mass noted. Normal appearance of the pancreas. The spleen is unremarkable. The adrenal glands both appear normal. The right kidney is normal. Scarring is identified involving the left kidney. There is marked enlargement of the prostate gland which has mass effect on the base of the bladder.  Calcified atherosclerotic disease affects the abdominal aorta. No aneurysm. There is no upper abdominal adenopathy identified. No pelvic or inguinal adenopathy identified. There is no free fluid or fluid collections within the abdomen or pelvis. No peritoneal nodule or mass identified.  The stomach appears normal. The small bowel loops appear within normal limits. Multiple distal colonic diverticula are present. No acute inflammation.  Review of the visualized bony structures is significant for mild thoracic and lumbar degenerative disc disease. No aggressive lytic or sclerotic bone lesions identified.  IMPRESSION: CT chest:  1. Overall interval increase in tumor involving the chest. 2. Progression of trans pleural spread of tumor involving the right hemi thorax. 3. Progression of right hilar and  mediastinal adenopathy.  . 4. Persistent loculated right pleural effusion which is not significantly changed in volume from previous exam status post chest tube placement. 5. Pulmonary nodule of left lower lobe is slightly increased in size from previous exam. CT abdomen pelvis:  1. No acute findings identified within the upper abdomen. No evidence for metastatic disease. 2. Liver cysts. 3. Atherosclerotic disease 4. Prostate gland enlargement.   Electronically Signed   By: Kerby Moors M.D.   On: 02/23/2013 13:57    ASSESSMENT AND PLAN: This is another very extensive and long visit with the patient and his wife today. The patient is a 31 years old white male who was diagnosed with extensive stage small cell lung cancer status post systemic chemotherapy with carboplatin and etoposide with progressive disease on the right side of the chest. He was recently seen at [facility] by Dr. Sharlet Salina and was considered for a clinical trial with Ipilumomab and Nivolumab.  Recent repeat biopsy of a right pleural based nodule showed poorly differentiated non-small cell carcinoma.  After the many evaluation by a medical oncologist in different institutions, the patient came to the conclusion that we are dealing with 2 types of lung cancer, small cell lung cancer which was treated with 4 cycles of systemic chemotherapy with carboplatin and etoposide and recently diagnosed poorly differentiated non-small cell lung cancer of the right pleural based nodules. He is currently being treated with systemic chemotherapy in the form of carboplatin for AUC of 5 on day 1 and Abraxane 100 mg/M2 on days 1, 8, and 15 every 3 weeks, for the recently diagnosed non-small cell lung cancer. He is status post day 1 of cycle 1. He did not tolerate this terribly well but is willing to proceed with day 8 of cycle 1. He wants to postpone referral to gastroenterology regarding his poor by mouth intake and weight  loss. Patient was reviewed with Dr. Lorelle Formosa. He  will proceed with day 8 of cycle #1 of his systemic chemotherapy with carboplatin and Abraxane. She will continue with weekly labs and chemotherapy as scheduled. He'll followup with Dr. Julien Nordmann in 2 weeks with another symptom management visit prior to the start of cycle #2 of his systemic chemotherapy with carboplatin and Abraxane. Patient requests that his labs be drawn from his [facility] and we will on her this request. A refill for his Ativan was called to his pharmacy of record. For pain management she will continue on his fentanyl patches. Patient has discontinued the Percocet/oxycodone secondary to constipation. He was advised to call immediately if he has any concerning symptoms in the interval. The patient voices understanding of current disease status and treatment options and is in agreement with the current care plan.  All questions were answered. The patient knows to call the clinic with any problems, questions or concerns. We can certainly see the patient much sooner if necessary. I spent 30 minutes on face-to-face counseling with the patient and his wife today of the total visit time of 40 minutes.  Carlton Adam, PA-C 03/04/2013

## 2013-03-04 NOTE — Patient Instructions (Signed)
[facility] Discharge Instructions for Patients Receiving Chemotherapy  Today you received the following chemotherapy agents abraxane.   To help prevent nausea and vomiting after your treatment, we encourage you to take your nausea medication as directed.     If you develop nausea and vomiting that is not controlled by your nausea medication, call the clinic.   BELOW ARE SYMPTOMS THAT SHOULD BE REPORTED IMMEDIATELY:  *FEVER GREATER THAN 100.5 F  *CHILLS WITH OR WITHOUT FEVER  NAUSEA AND VOMITING THAT IS NOT CONTROLLED WITH YOUR NAUSEA MEDICATION  *UNUSUAL SHORTNESS OF BREATH  *UNUSUAL BRUISING OR BLEEDING  TENDERNESS IN MOUTH AND THROAT WITH OR WITHOUT PRESENCE OF ULCERS  *URINARY PROBLEMS  *BOWEL PROBLEMS  UNUSUAL RASH Items with * indicate a potential emergency and should be followed up as soon as possible.  Feel free to call the clinic you have any questions or concerns. The clinic phone number is [phone].

## 2013-03-04 NOTE — Progress Notes (Signed)
Opened in error

## 2013-03-04 NOTE — Patient Instructions (Signed)
Continue weekly labs and chemotherapy as scheduled Followup in 2 weeks prior to the start of your next scheduled cycle of chemotherapy

## 2013-03-08 ENCOUNTER — Telehealth: Payer: Self-pay | Admitting: Internal Medicine

## 2013-03-08 ENCOUNTER — Telehealth: Payer: Self-pay | Admitting: *Deleted

## 2013-03-08 NOTE — Telephone Encounter (Signed)
, °

## 2013-03-08 NOTE — Telephone Encounter (Signed)
Per staff message and POF I have scheduled appts.  JMW  

## 2013-03-08 NOTE — Telephone Encounter (Signed)
Received call from [facility] in scheduling stating that pt's wife thought that pt did not receive chemo weekly, that he had a week off.  Verified with Dr Vista Mink, pt received chemotherapy weekly.  Called and left vm on Michael's vm informing her of pt's tx plan and to call back if she has any questions.  SLJ

## 2013-03-09 ENCOUNTER — Telehealth: Payer: Self-pay | Admitting: *Deleted

## 2013-03-09 NOTE — Telephone Encounter (Signed)
Pt called stating that he was told that his treatment plan was to have 3 weeks on, 1 week off.  Informed pt that per Dr Vista Mink and according to his office notes, the plan is for continuous chemotherapy on days 1, 8, 15.  Pt states "why are you guys changing the plan.  I go to the bank when I am told a plan and now things are changing and I am going to ask questions".  Informed patient that according to Dr Worthy Flank office notes continuous chemotherapy has been the plan, not to have chemo 3 weeks on, 1 week off.  Informed Dr Vista Mink that pt was on the line, pt call transferred to Dr [facility] office so Dr Vista Mink would speak with patient directly.  Dr Vista Mink explained what they had discussed regarding continuous chemotherapy.  Pt verbalized understanding.  SLJ

## 2013-03-11 ENCOUNTER — Ambulatory Visit (HOSPITAL_BASED_OUTPATIENT_CLINIC_OR_DEPARTMENT_OTHER): Payer: Commercial Managed Care - HMO

## 2013-03-11 ENCOUNTER — Other Ambulatory Visit (HOSPITAL_BASED_OUTPATIENT_CLINIC_OR_DEPARTMENT_OTHER): Payer: Medicare HMO

## 2013-03-11 VITALS — BP 119/72 | HR 72 | Temp 98.2°F | Resp 18

## 2013-03-11 DIAGNOSIS — C3491 Malignant neoplasm of unspecified part of right bronchus or lung: Secondary | ICD-10-CM

## 2013-03-11 DIAGNOSIS — C342 Malignant neoplasm of middle lobe, bronchus or lung: Secondary | ICD-10-CM

## 2013-03-11 DIAGNOSIS — Z5111 Encounter for antineoplastic chemotherapy: Secondary | ICD-10-CM

## 2013-03-11 LAB — CBC WITH DIFFERENTIAL/PLATELET
BASO%: 1.2 % (ref 0.0–2.0)
BASOS ABS: 0 10*3/uL (ref 0.0–0.1)
EOS ABS: 0.2 10*3/uL (ref 0.0–0.5)
EOS%: 6.7 % (ref 0.0–7.0)
HCT: 28.6 % — ABNORMAL LOW (ref 38.4–49.9)
HEMOGLOBIN: 9.1 g/dL — AB (ref 13.0–17.1)
LYMPH#: 0.4 10*3/uL — AB (ref 0.9–3.3)
LYMPH%: 12 % — ABNORMAL LOW (ref 14.0–49.0)
MCH: 28.4 pg (ref 27.2–33.4)
MCHC: 31.7 g/dL — ABNORMAL LOW (ref 32.0–36.0)
MCV: 89.4 fL (ref 79.3–98.0)
MONO#: 0.4 10*3/uL (ref 0.1–0.9)
MONO%: 11.5 % (ref 0.0–14.0)
NEUT#: 2.4 10*3/uL (ref 1.5–6.5)
NEUT%: 68.6 % (ref 39.0–75.0)
Platelets: 141 10*3/uL (ref 140–400)
RBC: 3.2 10*6/uL — ABNORMAL LOW (ref 4.20–5.82)
RDW: 16 % — AB (ref 11.0–14.6)
WBC: 3.6 10*3/uL — ABNORMAL LOW (ref 4.0–10.3)

## 2013-03-11 LAB — COMPREHENSIVE METABOLIC PANEL (CC13)
ALBUMIN: 2.9 g/dL — AB (ref 3.5–5.0)
ALT: 6 U/L (ref 0–55)
ANION GAP: 10 meq/L (ref 3–11)
AST: 12 U/L (ref 5–34)
Alkaline Phosphatase: 96 U/L (ref 40–150)
BUN: 16.2 mg/dL (ref 7.0–26.0)
CALCIUM: 9.2 mg/dL (ref 8.4–10.4)
CHLORIDE: 104 meq/L (ref 98–109)
CO2: 26 meq/L (ref 22–29)
CREATININE: 0.8 mg/dL (ref 0.7–1.3)
GLUCOSE: 107 mg/dL (ref 70–140)
POTASSIUM: 4.1 meq/L (ref 3.5–5.1)
Sodium: 140 mEq/L (ref 136–145)
Total Bilirubin: 0.25 mg/dL (ref 0.20–1.20)
Total Protein: 7 g/dL (ref 6.4–8.3)

## 2013-03-11 MED ORDER — DEXAMETHASONE SODIUM PHOSPHATE 10 MG/ML IJ SOLN
INTRAMUSCULAR | Status: AC
  Filled 2013-03-11: qty 1

## 2013-03-11 MED ORDER — HEPARIN SOD (PORK) LOCK FLUSH 100 UNIT/ML IV SOLN
500.0000 [IU] | Freq: Once | INTRAVENOUS | Status: AC | PRN
  Administered 2013-03-11: 500 [IU]
  Filled 2013-03-11: qty 5

## 2013-03-11 MED ORDER — SODIUM CHLORIDE 0.9 % IV SOLN
Freq: Once | INTRAVENOUS | Status: AC
  Administered 2013-03-11: 13:00:00 via INTRAVENOUS

## 2013-03-11 MED ORDER — PACLITAXEL PROTEIN-BOUND CHEMO INJECTION 100 MG
100.0000 mg/m2 | Freq: Once | INTRAVENOUS | Status: AC
  Administered 2013-03-11: 200 mg via INTRAVENOUS
  Filled 2013-03-11: qty 40

## 2013-03-11 MED ORDER — ONDANSETRON 8 MG/NS 50 ML IVPB
INTRAVENOUS | Status: AC
  Filled 2013-03-11: qty 8

## 2013-03-11 MED ORDER — ONDANSETRON 8 MG/50ML IVPB (CHCC)
8.0000 mg | Freq: Once | INTRAVENOUS | Status: AC
  Administered 2013-03-11: 8 mg via INTRAVENOUS

## 2013-03-11 MED ORDER — SODIUM CHLORIDE 0.9 % IJ SOLN
10.0000 mL | INTRAMUSCULAR | Status: DC | PRN
  Administered 2013-03-11: 10 mL
  Filled 2013-03-11: qty 10

## 2013-03-11 MED ORDER — DEXAMETHASONE SODIUM PHOSPHATE 10 MG/ML IJ SOLN
10.0000 mg | Freq: Once | INTRAMUSCULAR | Status: AC
  Administered 2013-03-11: 10 mg via INTRAVENOUS

## 2013-03-11 NOTE — Patient Instructions (Signed)
[facility] Discharge Instructions for Patients Receiving Chemotherapy  Today you received the following chemotherapy agents: Abraxane  To help prevent nausea and vomiting after your treatment, we encourage you to take your nausea medication as prescribed by your physician.   If you develop nausea and vomiting that is not controlled by your nausea medication, call the clinic.   BELOW ARE SYMPTOMS THAT SHOULD BE REPORTED IMMEDIATELY:  *FEVER GREATER THAN 100.5 F  *CHILLS WITH OR WITHOUT FEVER  NAUSEA AND VOMITING THAT IS NOT CONTROLLED WITH YOUR NAUSEA MEDICATION  *UNUSUAL SHORTNESS OF BREATH  *UNUSUAL BRUISING OR BLEEDING  TENDERNESS IN MOUTH AND THROAT WITH OR WITHOUT PRESENCE OF ULCERS  *URINARY PROBLEMS  *BOWEL PROBLEMS  UNUSUAL RASH Items with * indicate a potential emergency and should be followed up as soon as possible.  Feel free to call the clinic you have any questions or concerns. The clinic phone number is [phone].

## 2013-03-17 ENCOUNTER — Telehealth: Payer: Self-pay | Admitting: Internal Medicine

## 2013-03-17 ENCOUNTER — Encounter: Payer: Self-pay | Admitting: Internal Medicine

## 2013-03-17 ENCOUNTER — Other Ambulatory Visit (HOSPITAL_BASED_OUTPATIENT_CLINIC_OR_DEPARTMENT_OTHER): Payer: Medicare HMO

## 2013-03-17 ENCOUNTER — Ambulatory Visit: Payer: Commercial Managed Care - HMO

## 2013-03-17 ENCOUNTER — Ambulatory Visit: Payer: Medicare HMO | Admitting: Internal Medicine

## 2013-03-17 ENCOUNTER — Ambulatory Visit (HOSPITAL_BASED_OUTPATIENT_CLINIC_OR_DEPARTMENT_OTHER): Payer: Commercial Managed Care - HMO | Admitting: Internal Medicine

## 2013-03-17 VITALS — BP 115/51 | HR 75 | Temp 99.0°F | Resp 18 | Ht 72.0 in | Wt 178.0 lb

## 2013-03-17 DIAGNOSIS — D696 Thrombocytopenia, unspecified: Secondary | ICD-10-CM

## 2013-03-17 DIAGNOSIS — C3490 Malignant neoplasm of unspecified part of unspecified bronchus or lung: Secondary | ICD-10-CM

## 2013-03-17 DIAGNOSIS — C342 Malignant neoplasm of middle lobe, bronchus or lung: Secondary | ICD-10-CM

## 2013-03-17 DIAGNOSIS — C3491 Malignant neoplasm of unspecified part of right bronchus or lung: Secondary | ICD-10-CM

## 2013-03-17 DIAGNOSIS — Z95828 Presence of other vascular implants and grafts: Secondary | ICD-10-CM

## 2013-03-17 LAB — CBC WITH DIFFERENTIAL/PLATELET
BASO%: 0.7 % (ref 0.0–2.0)
BASOS ABS: 0 10*3/uL (ref 0.0–0.1)
EOS%: 2.9 % (ref 0.0–7.0)
Eosinophils Absolute: 0.1 10*3/uL (ref 0.0–0.5)
HCT: 27.6 % — ABNORMAL LOW (ref 38.4–49.9)
HEMOGLOBIN: 8.8 g/dL — AB (ref 13.0–17.1)
LYMPH#: 0.3 10*3/uL — AB (ref 0.9–3.3)
LYMPH%: 11.9 % — ABNORMAL LOW (ref 14.0–49.0)
MCH: 28.2 pg (ref 27.2–33.4)
MCHC: 31.9 g/dL — ABNORMAL LOW (ref 32.0–36.0)
MCV: 88.2 fL (ref 79.3–98.0)
MONO#: 0.2 10*3/uL (ref 0.1–0.9)
MONO%: 8.8 % (ref 0.0–14.0)
NEUT#: 1.9 10*3/uL (ref 1.5–6.5)
NEUT%: 75.7 % — ABNORMAL HIGH (ref 39.0–75.0)
Platelets: 86 10*3/uL — ABNORMAL LOW (ref 140–400)
RBC: 3.12 10*6/uL — AB (ref 4.20–5.82)
RDW: 16 % — AB (ref 11.0–14.6)
WBC: 2.5 10*3/uL — ABNORMAL LOW (ref 4.0–10.3)

## 2013-03-17 LAB — COMPREHENSIVE METABOLIC PANEL (CC13)
ALK PHOS: 83 U/L (ref 40–150)
ALT: 8 U/L (ref 0–55)
AST: 12 U/L (ref 5–34)
Albumin: 2.6 g/dL — ABNORMAL LOW (ref 3.5–5.0)
Anion Gap: 10 mEq/L (ref 3–11)
BUN: 16.1 mg/dL (ref 7.0–26.0)
CALCIUM: 8.9 mg/dL (ref 8.4–10.4)
CO2: 26 mEq/L (ref 22–29)
Chloride: 105 mEq/L (ref 98–109)
Creatinine: 0.9 mg/dL (ref 0.7–1.3)
Glucose: 93 mg/dl (ref 70–140)
POTASSIUM: 3.8 meq/L (ref 3.5–5.1)
Sodium: 141 mEq/L (ref 136–145)
TOTAL PROTEIN: 6.5 g/dL (ref 6.4–8.3)
Total Bilirubin: 0.36 mg/dL (ref 0.20–1.20)

## 2013-03-17 MED ORDER — HEPARIN SOD (PORK) LOCK FLUSH 100 UNIT/ML IV SOLN
500.0000 [IU] | Freq: Once | INTRAVENOUS | Status: AC
  Administered 2013-03-17: 500 [IU] via INTRAVENOUS
  Filled 2013-03-17: qty 5

## 2013-03-17 MED ORDER — SODIUM CHLORIDE 0.9 % IJ SOLN
10.0000 mL | INTRAMUSCULAR | Status: DC | PRN
  Administered 2013-03-17: 10 mL via INTRAVENOUS
  Filled 2013-03-17: qty 10

## 2013-03-17 MED ORDER — LORAZEPAM 1 MG PO TABS: 1.0000 mg | ORAL_TABLET | Freq: Three times a day (TID) | ORAL | Status: DC | PRN

## 2013-03-17 NOTE — Patient Instructions (Signed)
We will delay the start of cycle #2 to next week. Followup visit in 2 weeks.

## 2013-03-17 NOTE — Telephone Encounter (Signed)
gv adn pritned aptps ched and avs for pt for March and April....sed added tx.Samuel KitchenMarland KitchenMarland KitchenMarland Kitchenpt MD change tx days to thurs

## 2013-03-17 NOTE — Progress Notes (Signed)
[place] [phone]   [phone]  OFFICE VISIT PROGRESS NOTE  Samuel Hare, MD [address] [place] [address]  DIAGNOSIS AND STAGE:  1) Poorly differentiated non-small cell lung cancer diagnosed in December of 2014 2) Extensive stage small cell lung cancer diagnosed in July 2014.  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 4)  History of oncocytic renal tumor status post radiofrequency ablation at [facility] in August of 2008.  PRIOR THERAPY: Systemic chemotherapy with carboplatin for AUC of 5 on day 1 and etoposide 120 mg/M2 on days 1, 2 and 3 with Neulasta support on day 4, status post 4 cycles. First dose was given on 08/18/2012.    CURRENT THERAPY: Systemic chemotherapy with carboplatin for AUC of 5 on day 1 and Abraxane 100 mg/M2 on days 1, 8 and 15 every 3 weeks, status post 1 cycle.  INTERVAL HISTORY: Samuel Hurley y.o. male returns to the clinic today for followup visit accompanied by his wife Samuel Hurley.   On 01/29/2013, The patient is feeling much better today with no specific complaints except for the occasional right-sided chest pain and this is well-controlled with his current pain medication with fentanyl patch 50 mcg/hour every 3 days in addition to Percocet on as-needed basis. He takes around 2-3 Percocet on daily basis for breakthrough pain. His shortness of breath is much improved. The patient continues to have drainage from the Pleuryx catheter but this is down to less than 50 cc every 3 days and he is expected to have his Pleurx catheter removed and few weeks by Dr. Kerin Ransom at [facility]. He recently underwent biopsy of one of the right pleural-based nodule at [facility] and the final  pathology was consistent with poorly differentiated non-small cell carcinoma. The tumor cells are of small size with hyperchromatic nuclei and some molded forms. Many of the cells have abundant cytoplasm and nuclei with prominent nucleoli. Strong immunoreactivity is observed for anti-keratins and CK5/6, with negative staining observed for calretinin, WT-1, CK7, desmin, TTF-1 , Fli-1, CD99 and CD56. The entirety of these findings is most consistent with non-small carcinoma, with immunohistochemical evidence of rudimentary squamous differentiation. Bright pankeratin/ CK5/6 immunoreactivity with negative staining for TTF-1 and CD56 is not typical of small cell carcinoma.  He was seen recently by Dr. Sharlet Salina who sent him to be treated locally for the recently diagnosed non-small cell carcinoma as the patient is not a candidate for the clinical trial with Ipilumomab and Nivolumab for the second line option for the previously diagnosed small cell lung cancer. The patient was also seen recently at [facility] by Dr. Lewanda Rife who discussed within palliative systemic chemotherapy for his condition. The patient is not accepting the fact that he has an incurable condition and he still shopping around for other treatment options. He also requested a referral to the [facility]. He came today for evaluation and discussion of his treatment options after the recent diagnosis of non-small cell carcinoma.  On 02/21/2013, the patient present today for evaluation of his condition. He was seen by her medical oncologist in the last few weeks including Dr. Harlow Asa at [facility] in [facility] as well as a Statistician at  [facility] in [place]. The visit records from Dr. Harlow Asa are currently available to me and he is in agreement with the current evaluation of Samuel Hurley regarding dealing with 2 different lung cancer including small cell  lung cancer which was treated with carboplatin and etoposide in addition to recently diagnosed poorly differentiated non-small cell carcinoma. He also concurs with the treatment options provided for Samuel Hurley including treatment with carboplatin and Abraxane. The patient is here today for evaluation and consideration of starting treatment for his newly diagnosed poorly differentiated non-small cell lung cancer. He is feeling much better today with no specific complaints except for mild fatigue. His Pleurx catheter drains less than 75 cc of pleural fluid every 3 days. The molecular biomarkers performed at [facility] were negative for EGFR mutation and the ALK gene translocation.  On 03/17/2013, the patient is here today for evaluation accompanied by his wife. He tolerated the first cycle of his systemic chemotherapy with carboplatin and Abraxane fairly well with no significant adverse effects. He denied having any significant nausea or vomiting, no fever or chills. He lost 1 pound since his last visit. He has good appetite but concerned about dysphagia from his previous head and neck cancer. I discussed with referred to gastroenterology for evaluation and consideration of PEG tube placement if needed but the patient declined and mentions that he is feeling okay for now. Pleurx catheter was removed yesterday by Dr. Kerin Ransom. He was supposed to start cycle #2 today but has low platelets count.  MEDICAL HISTORY: Past Medical History  Diagnosis Date  . Hyperlipidemia   . OSA (obstructive sleep apnea)   . BPH (benign prostatic hypertrophy)   . Kidney disease     TCC  . Xerostomia   . Pharynx cancer     squamous cell stage 4,s/p XRT,chemo, neck dissection  . Colon polyp     HYPERPLASTIC & TUBULAR ADENOMA(Colonoscopy-Dr.[place])  . Diverticulosis of colon (without mention of hemorrhage)     (Colonoscopy-Dr.[place])  . Internal hemorrhoids without mention of complication     (Colonoscopy-Dr.)    . GERD (gastroesophageal reflux disease)     (EGD-Dr. Velora Heckler)  . Atrophic gastritis without mention of hemorrhage     (EGD-Dr. Velora Heckler)  . History of radiation therapy     ALLERGIES:  is allergic to erythromycin and tetracycline.  MEDICATIONS:  Current Outpatient Prescriptions  Medication Sig Dispense Refill  . Alum & Mag Hydroxide-Simeth (MAGIC MOUTHWASH) SOLN Take 5 mLs by mouth. Pt is unsure if there is lidocaine in it.  Takes once daily      . fentaNYL (DURAGESIC - DOSED MCG/HR) 50 MCG/HR Place 1 patch (50 mcg total) onto the skin every 3 (three) days.  10 patch  0  . finasteride (PROSCAR) 5 MG tablet Take 5 mg by mouth at bedtime.       . furosemide (LASIX) 20 MG tablet TAKE (1) TABLET DAILY AS NEEDED FOR SWELLING.  30 tablet  0  . levothyroxine (SYNTHROID, LEVOTHROID) 75 MCG tablet Take 75 mcg by mouth daily before breakfast.       . LORazepam (ATIVAN) 1 MG tablet Take 1 tablet (1 mg total) by mouth every 8 (eight) hours as needed for anxiety.  30 tablet  0  . megestrol (MEGACE ORAL) 40 MG/ML suspension Take 10 cc daily for appetite  400 mL  0  . NASONEX 50 MCG/ACT nasal spray PLACE 2 SPRAYS INTO EACH NOSTRIL DAILY.  17 g  11  .  prochlorperazine (COMPAZINE) 10 MG tablet Take 10 mg by mouth every 6 (six) hours as needed for nausea or vomiting.      . sertraline (ZOLOFT) 100 MG tablet Take 1 tablet (100 mg total) by mouth daily.  30 tablet  prn  . terazosin (HYTRIN) 1 MG capsule Take 1 mg by mouth 2 (two) times daily.        Current Facility-Administered Medications  Medication Dose Route Frequency Provider Last Rate Last Dose  . heparin lock flush 100 unit/mL  500 Units Intravenous Once Curt Bears, MD      . sodium chloride 0.9 % injection 10 mL  10 mL Intravenous PRN Curt Bears, MD      . Genia Hotter Durwin Reges) injection 0.5 mL  0.5 mL Intramuscular Once Neena Rhymes, MD        SURGICAL HISTORY:  Past Surgical History  Procedure Laterality Date  . Appendectomy    .  Lumbar laminectomy    . Tonsillectomy    . Bilateral ganglionectomies    . Radical right neck dissection  2008  . Pci-rfa renal cell (tcc) cancer  2009    pt denies  . Cholecystectomy  dec. 2009  . Septoplasty  03/01/12    with double turbinectomy  . Video bronchoscopy Bilateral 08/10/2012    Procedure: VIDEO BRONCHOSCOPY WITH FLUORO;  Surgeon: Rigoberto Noel, MD;  Location: [facility];  Service: Cardiopulmonary;  Laterality: Bilateral;  . Shoulder arthroscopy Left 2013  . Thoracentesis Right 10/2012    REVIEW OF SYSTEMS:  Constitutional: negative Eyes: negative Ears, nose, mouth, throat, and face: negative Respiratory: positive for pleurisy/chest pain Cardiovascular: negative Gastrointestinal: positive for dysphagia Genitourinary:negative Integument/breast: negative Hematologic/lymphatic: negative Musculoskeletal:positive for back pain Neurological: negative Behavioral/Psych: positive for anxiety, bad mood, depression, irritability and mood swings Endocrine: negative Allergic/Immunologic: negative   PHYSICAL EXAMINATION: General appearance: alert, cooperative and no distress Head: Normocephalic, without obvious abnormality, atraumatic Neck: no adenopathy and supple, symmetrical, trachea midline Lymph nodes: Cervical, supraclavicular, and axillary nodes normal. Resp: clear to auscultation bilaterally Back: symmetric, no curvature. ROM normal. No CVA tenderness. Cardio: regular rate and rhythm, S1, S2 normal, no murmur, click, rub or gallop GI: soft, non-tender; bowel sounds normal; no masses,  no organomegaly Extremities: extremities normal, atraumatic, no cyanosis or edema Neurologic: Alert and oriented X 3, normal strength and tone. Normal symmetric reflexes. Normal coordination and gait   ECOG PERFORMANCE STATUS: 1 - Symptomatic but completely ambulatory  Blood pressure 115/51, pulse 75, temperature 99 F (37.2 C), temperature source Oral, resp. rate 18, height 6'  (1.829 m), weight 178 lb (80.74 kg).  LABORATORY DATA: Lab Results  Component Value Date   WBC 2.5* 03/17/2013   HGB 8.8* 03/17/2013   HCT 27.6* 03/17/2013   MCV 88.2 03/17/2013   PLT 86* 03/17/2013      Chemistry      Component Value Date/Time   NA 140 03/11/2013 1310   NA 135 12/11/2012 1220   K 4.1 03/11/2013 1310   K 4.1 12/11/2012 1220   CL 99 12/11/2012 1220   CO2 26 03/11/2013 1310   CO2 23 12/11/2012 1220   BUN 16.2 03/11/2013 1310   BUN 17 12/11/2012 1220   CREATININE 0.8 03/11/2013 1310   CREATININE 0.99 12/11/2012 1220      Component Value Date/Time   CALCIUM 9.2 03/11/2013 1310   CALCIUM 9.4 12/11/2012 1220   ALKPHOS 96 03/11/2013 1310   ALKPHOS 76 05/13/2012 1005   AST 12 03/11/2013 1310  AST 16 05/13/2012 1005   ALT 6 03/11/2013 1310   ALT 11 05/13/2012 1005   BILITOT 0.25 03/11/2013 1310   BILITOT 0.5 05/13/2012 1005       RADIOGRAPHIC STUDIES:  ASSESSMENT AND PLAN: This is another very extensive and long visit with the patient and his wife today. The patient is a 6 years old white male who was diagnosed with extensive stage small cell lung cancer status post systemic chemotherapy with carboplatin and etoposide with progressive disease on the right side of the chest. He was recently seen at [facility] by Dr. Sharlet Salina and was considered for a clinical trial with Ipilumomab and Nivolumab.  Recent repeat biopsy of a right pleural based nodule showed poorly differentiated non-small cell carcinoma. He is currently undergoing systemic chemotherapy with carboplatin and Abraxane status post 1 cycle. He has thrombocytopenia today with platelets count of 86,000. I will delay the start of cycle #2 to next week.  He would come back for followup visit in 2 weeks for evaluation and management any adverse effect of his treatment. For the pain management, the patient will continue on his current pain medication with fentanyl patch and he is not using  much of the Percocet. He was advised to call immediately if he has any concerning symptoms in the interval. The patient voices understanding of current disease status and treatment options and is in agreement with the current care plan.  All questions were answered. The patient knows to call the clinic with any problems, questions or concerns. We can certainly see the patient much sooner if necessary. I spent 15 minutes on face-to-face counseling with the patient and his wife today of the total visit time of 25 minutes.  Disclaimer: This note was dictated with voice recognition software. Similar sounding words can inadvertently be transcribed and may not be corrected upon review.  Eilleen Kempf., MD 03/17/2013

## 2013-03-17 NOTE — Patient Instructions (Signed)

## 2013-03-18 ENCOUNTER — Other Ambulatory Visit: Payer: Medicare HMO

## 2013-03-18 ENCOUNTER — Ambulatory Visit: Payer: Medicare HMO

## 2013-03-22 ENCOUNTER — Ambulatory Visit (INDEPENDENT_AMBULATORY_CARE_PROVIDER_SITE_OTHER): Payer: Medicare HMO | Admitting: Internal Medicine

## 2013-03-22 ENCOUNTER — Encounter: Payer: Self-pay | Admitting: Internal Medicine

## 2013-03-22 VITALS — BP 106/50 | HR 81 | Temp 97.5°F

## 2013-03-22 DIAGNOSIS — C3490 Malignant neoplasm of unspecified part of unspecified bronchus or lung: Secondary | ICD-10-CM

## 2013-03-22 NOTE — Progress Notes (Signed)
Pre visit review using our clinic review tool, if applicable. No additional management support is needed unless otherwise documented below in the visit note. 

## 2013-03-22 NOTE — Patient Instructions (Signed)
Thanks for coming in to see me today and over the years.  You have a heavy tumor burden and the course is difficult and fraught with uncertainty.  I encourage you to keep in mind that you can have control over your care and you can determine how hard to fight and when to change your strategy to dignity, comfort and quality time. Please keep an open mind about hospice and palliative care as an important resource you can draw on.  Falling is most likely due to progressive weakness related to the whole situation: anemia, weight loss, deconditioning. You need to limit your activities for safety's sake: not going out to walk the dog alone and to use something to help your balance, such as a walker or a cane.   Call on me if I can help.

## 2013-03-24 ENCOUNTER — Ambulatory Visit: Payer: Commercial Managed Care - HMO

## 2013-03-24 ENCOUNTER — Ambulatory Visit (HOSPITAL_BASED_OUTPATIENT_CLINIC_OR_DEPARTMENT_OTHER): Payer: Commercial Managed Care - HMO

## 2013-03-24 ENCOUNTER — Telehealth: Payer: Self-pay

## 2013-03-24 ENCOUNTER — Other Ambulatory Visit (HOSPITAL_BASED_OUTPATIENT_CLINIC_OR_DEPARTMENT_OTHER): Payer: Medicare HMO

## 2013-03-24 VITALS — BP 106/51 | HR 72 | Temp 96.8°F | Resp 17

## 2013-03-24 DIAGNOSIS — R0600 Dyspnea, unspecified: Secondary | ICD-10-CM

## 2013-03-24 DIAGNOSIS — C3491 Malignant neoplasm of unspecified part of right bronchus or lung: Secondary | ICD-10-CM

## 2013-03-24 DIAGNOSIS — R5381 Other malaise: Secondary | ICD-10-CM

## 2013-03-24 DIAGNOSIS — C342 Malignant neoplasm of middle lobe, bronchus or lung: Secondary | ICD-10-CM

## 2013-03-24 DIAGNOSIS — Z5111 Encounter for antineoplastic chemotherapy: Secondary | ICD-10-CM

## 2013-03-24 DIAGNOSIS — J90 Pleural effusion, not elsewhere classified: Secondary | ICD-10-CM

## 2013-03-24 DIAGNOSIS — Z95828 Presence of other vascular implants and grafts: Secondary | ICD-10-CM

## 2013-03-24 DIAGNOSIS — R5383 Other fatigue: Secondary | ICD-10-CM

## 2013-03-24 DIAGNOSIS — C3490 Malignant neoplasm of unspecified part of unspecified bronchus or lung: Secondary | ICD-10-CM

## 2013-03-24 LAB — CBC WITH DIFFERENTIAL/PLATELET
BASO%: 0.4 % (ref 0.0–2.0)
Basophils Absolute: 0 10*3/uL (ref 0.0–0.1)
EOS ABS: 0 10*3/uL (ref 0.0–0.5)
EOS%: 0.4 % (ref 0.0–7.0)
HCT: 36.4 % — ABNORMAL LOW (ref 38.4–49.9)
HGB: 11.8 g/dL — ABNORMAL LOW (ref 13.0–17.1)
LYMPH%: 7.1 % — ABNORMAL LOW (ref 14.0–49.0)
MCH: 28.1 pg (ref 27.2–33.4)
MCHC: 32.4 g/dL (ref 32.0–36.0)
MCV: 86.7 fL (ref 79.3–98.0)
MONO#: 0.8 10*3/uL (ref 0.1–0.9)
MONO%: 29.5 % — ABNORMAL HIGH (ref 0.0–14.0)
NEUT#: 1.6 10*3/uL (ref 1.5–6.5)
NEUT%: 62.6 % (ref 39.0–75.0)
PLATELETS: 128 10*3/uL — AB (ref 140–400)
RBC: 4.2 10*6/uL (ref 4.20–5.82)
RDW: 16.8 % — AB (ref 11.0–14.6)
WBC: 2.5 10*3/uL — ABNORMAL LOW (ref 4.0–10.3)
lymph#: 0.2 10*3/uL — ABNORMAL LOW (ref 0.9–3.3)

## 2013-03-24 LAB — COMPREHENSIVE METABOLIC PANEL (CC13)
ALBUMIN: 1.9 g/dL — AB (ref 3.5–5.0)
ALK PHOS: 198 U/L — AB (ref 40–150)
ALT: 24 U/L (ref 0–55)
AST: 28 U/L (ref 5–34)
Anion Gap: 10 mEq/L (ref 3–11)
BILIRUBIN TOTAL: 0.52 mg/dL (ref 0.20–1.20)
BUN: 16.3 mg/dL (ref 7.0–26.0)
CO2: 27 mEq/L (ref 22–29)
Calcium: 9.1 mg/dL (ref 8.4–10.4)
Chloride: 101 mEq/L (ref 98–109)
Creatinine: 0.8 mg/dL (ref 0.7–1.3)
Glucose: 106 mg/dl (ref 70–140)
POTASSIUM: 3.3 meq/L — AB (ref 3.5–5.1)
SODIUM: 138 meq/L (ref 136–145)
TOTAL PROTEIN: 6.4 g/dL (ref 6.4–8.3)

## 2013-03-24 MED ORDER — ONDANSETRON 16 MG/50ML IVPB (CHCC)
16.0000 mg | Freq: Once | INTRAVENOUS | Status: AC
  Administered 2013-03-24: 16 mg via INTRAVENOUS

## 2013-03-24 MED ORDER — HEPARIN SOD (PORK) LOCK FLUSH 100 UNIT/ML IV SOLN
500.0000 [IU] | Freq: Once | INTRAVENOUS | Status: AC | PRN
  Administered 2013-03-24: 500 [IU]
  Filled 2013-03-24: qty 5

## 2013-03-24 MED ORDER — SODIUM CHLORIDE 0.9 % IJ SOLN
10.0000 mL | INTRAMUSCULAR | Status: DC | PRN
  Administered 2013-03-24: 10 mL
  Filled 2013-03-24: qty 10

## 2013-03-24 MED ORDER — ONDANSETRON 16 MG/50ML IVPB (CHCC)
INTRAVENOUS | Status: AC
  Filled 2013-03-24: qty 16

## 2013-03-24 MED ORDER — HEPARIN SOD (PORK) LOCK FLUSH 100 UNIT/ML IV SOLN
500.0000 [IU] | Freq: Once | INTRAVENOUS | Status: AC
  Administered 2013-03-24: 500 [IU] via INTRAVENOUS
  Filled 2013-03-24: qty 5

## 2013-03-24 MED ORDER — SODIUM CHLORIDE 0.9 % IJ SOLN
10.0000 mL | INTRAMUSCULAR | Status: DC | PRN
  Administered 2013-03-24: 10 mL via INTRAVENOUS
  Filled 2013-03-24: qty 10

## 2013-03-24 MED ORDER — DEXAMETHASONE SODIUM PHOSPHATE 20 MG/5ML IJ SOLN
INTRAMUSCULAR | Status: AC
  Filled 2013-03-24: qty 5

## 2013-03-24 MED ORDER — DEXAMETHASONE SODIUM PHOSPHATE 20 MG/5ML IJ SOLN
20.0000 mg | Freq: Once | INTRAMUSCULAR | Status: AC
  Administered 2013-03-24: 20 mg via INTRAVENOUS

## 2013-03-24 MED ORDER — SODIUM CHLORIDE 0.9 % IV SOLN
Freq: Once | INTRAVENOUS | Status: AC
  Administered 2013-03-24: 15:00:00 via INTRAVENOUS

## 2013-03-24 MED ORDER — SODIUM CHLORIDE 0.9 % IV SOLN
507.5000 mg | Freq: Once | INTRAVENOUS | Status: AC
  Administered 2013-03-24: 510 mg via INTRAVENOUS
  Filled 2013-03-24: qty 51

## 2013-03-24 MED ORDER — PACLITAXEL PROTEIN-BOUND CHEMO INJECTION 100 MG
100.0000 mg/m2 | Freq: Once | INTRAVENOUS | Status: AC
  Administered 2013-03-24: 200 mg via INTRAVENOUS
  Filled 2013-03-24: qty 40

## 2013-03-24 NOTE — Patient Instructions (Signed)

## 2013-03-24 NOTE — Assessment & Plan Note (Signed)
Reviewed all imaging with the patient and his wife. Discussed his course of treatment briefly. Advised the patient to understand and remember that he can make a range of choices in regard to his care. Suggested that he keep his mind open about palliative and hospice care as an option.

## 2013-03-24 NOTE — Telephone Encounter (Signed)
Phone call from [place] with [facility]. She visited [place] yesterday. BP was 94/50 with a HR of 76. She requests orders for home health aid and physical therapy.

## 2013-03-24 NOTE — Telephone Encounter (Signed)
Order entered

## 2013-03-24 NOTE — Progress Notes (Signed)
Subjective:    Patient ID: Samuel Hurley, male    DOB: 11-09-1934, 79 y.o.   MRN: [id number]  Fall Pertinent negatives include no abdominal pain, fever or nausea.   Samuel Hurley, accompanied by his wife, presents for evaluation of falls. By her report he has had several falls w/o LOC. Most recently he took the dog out early of a morning and fell. He has also been falling inside. He reports that he will feel very weak, light headed and then will loose his balance and fall. Fortunately he has not suffered any major injury.  Samuel Hurley has ah/o stage 4 squamous cell carcinoma of the pharynx s/p chemo, neck dissection and has severe xerostomia. He has also been diagnosed with lung cancer with a 6.4cm primary mass on latest CT chest with adenopathy, nodular met, pleural involvement. He has had progressive disease despite treatment and did seek out clinical trials in [place] and at [facility] to no avail. Currently he is under the care of Dr. Julien Nordmann and has complete 3 of 9 course of aggressive chemotherapy which has taken a toll on him.  At his and Braylei Totino's request we reviewed his last set of CT scans of the chest, abd/pelvis and brain. Fortunately he had no brain mets, no abdominal involvement.  Past Medical History  Diagnosis Date  . Hyperlipidemia   . OSA (obstructive sleep apnea)   . BPH (benign prostatic hypertrophy)   . Kidney disease     TCC  . Xerostomia   . Pharynx cancer     squamous cell stage 4,s/p XRT,chemo, neck dissection  . Colon polyp     HYPERPLASTIC & TUBULAR ADENOMA(Colonoscopy-Dr.[place])  . Diverticulosis of colon (without mention of hemorrhage)     (Colonoscopy-Dr.[place])  . Internal hemorrhoids without mention of complication     (Colonoscopy-Dr.[place])  . GERD (gastroesophageal reflux disease)     (EGD-Dr. Velora Heckler)  . Atrophic gastritis without mention of hemorrhage     (EGD-Dr. Velora Heckler)  . History of radiation therapy    Past Surgical History  Procedure  Laterality Date  . Appendectomy    . Lumbar laminectomy    . Tonsillectomy    . Bilateral ganglionectomies    . Radical right neck dissection  2008  . Pci-rfa renal cell (tcc) cancer  2009    pt denies  . Cholecystectomy  dec. 2009  . Septoplasty  03/01/12    with double turbinectomy  . Video bronchoscopy Bilateral 08/10/2012    Procedure: VIDEO BRONCHOSCOPY WITH FLUORO;  Surgeon: Rigoberto Noel, MD;  Location: [facility];  Service: Cardiopulmonary;  Laterality: Bilateral;  . Shoulder arthroscopy Left 2013  . Thoracentesis Right 10/2012   Family History  Problem Relation Age of Onset  . Heart failure Father   . Kidney failure Father   . Other Father     Renal failure/ CHF  . Heart failure Mother   . Other Mother     CHF  . Colon cancer Neg Hx   . Prostate cancer Neg Hx   . Diabetes Neg Hx    History   Social History  . Marital Status: Married    Spouse Name: N/A    Number of Children: 2  . Years of Education: N/A   Occupational History  . minister    Social History Main Topics  . Smoking status: Never Smoker   . Smokeless tobacco: Never Used  . Alcohol Use: No  . Drug Use: No  . Sexual Activity: Not  on file   Other Topics Concern  . Not on file   Social History Narrative   Father deceased @ 89,Mother deceased @ 75. [place];[employer]. Married -62-17 yrs./divorced;married '83. 1 son-'63;1 daughter '64; 1 stepson '66; 1 grandchild.    Current Outpatient Prescriptions on File Prior to Visit  Medication Sig Dispense Refill  . Alum & Mag Hydroxide-Simeth (MAGIC MOUTHWASH) SOLN Take 5 mLs by mouth. Pt is unsure if there is lidocaine in it.  Takes once daily      . finasteride (PROSCAR) 5 MG tablet Take 5 mg by mouth at bedtime.       . furosemide (LASIX) 20 MG tablet TAKE (1) TABLET DAILY AS NEEDED FOR SWELLING.  30 tablet  0  . levothyroxine (SYNTHROID, LEVOTHROID) 75 MCG tablet Take 75 mcg by mouth daily before breakfast.       .  LORazepam (ATIVAN) 1 MG tablet Take 1 tablet (1 mg total) by mouth every 8 (eight) hours as needed for anxiety.  30 tablet  0  . NASONEX 50 MCG/ACT nasal spray PLACE 2 SPRAYS INTO EACH NOSTRIL DAILY.  17 g  11  . prochlorperazine (COMPAZINE) 10 MG tablet Take 10 mg by mouth every 6 (six) hours as needed for nausea or vomiting.      . sertraline (ZOLOFT) 100 MG tablet Take 1 tablet (100 mg total) by mouth daily.  30 tablet  prn  . terazosin (HYTRIN) 1 MG capsule Take 1 mg by mouth 2 (two) times daily.       . fentaNYL (DURAGESIC - DOSED MCG/HR) 50 MCG/HR Place 1 patch (50 mcg total) onto the skin every 3 (three) days.  10 patch  0  . megestrol (MEGACE ORAL) 40 MG/ML suspension Take 10 cc daily for appetite  400 mL  0   Current Facility-Administered Medications on File Prior to Visit  Medication Dose Route Frequency Provider Last Rate Last Dose  . TDaP (BOOSTRIX) injection 0.5 mL  0.5 mL Intramuscular Once Neena Rhymes, MD           Review of Systems  Constitutional: Positive for activity change, appetite change and fatigue. Negative for fever and diaphoresis.  HENT: Negative for congestion, dental problem, facial swelling, mouth sores and sore throat.   Eyes: Negative.   Respiratory: Positive for shortness of breath. Negative for apnea, choking, chest tightness and wheezing.   Cardiovascular: Negative.   Gastrointestinal: Positive for constipation. Negative for nausea, abdominal pain, diarrhea and abdominal distention.  Endocrine: Negative.   Musculoskeletal: Positive for gait problem. Negative for arthralgias, back pain and joint swelling.  Skin: Positive for pallor. Negative for rash.  Allergic/Immunologic: Negative.   Neurological: Positive for weakness and light-headedness. Negative for dizziness, seizures, facial asymmetry and speech difficulty.  Hematological: Bruises/bleeds easily.  Psychiatric/Behavioral: Positive for dysphoric mood and decreased concentration. Negative for  hallucinations, behavioral problems and confusion. The patient is not nervous/anxious and is not hyperactive.        Objective:   Physical Exam Filed Vitals:   03/22/13 1558  BP: 106/50  Pulse: 81  Temp: 97.5 F (36.4 C)   Gen'l - chronically ill appearing man in no acute distress but with depressed affect HEENT- C&S pale Cor - 2+ radial pulse, RRR Pulm - no increased WOB at rest, decreased breath sounds, no wheezing Neuro - slow cognition but clear speech, seems to have mild encephalopathy. Able to rise from w/c w/o assistance but appears unsteady on his feet. No tremor.  Assessment & Plan:  Falls - Falling is most likely due to progressive weakness related to the whole situation: anemia, weight loss, deconditioning. You need to limit your activities for safety's sake: not going out to walk the dog alone and to use something to help your balance, such as a walker or a cane.   Note: Samuel Hurley fell outside under the car canopy as he was leaving the office. He was not injured and with his wife's help was able to get into his car.

## 2013-03-24 NOTE — Patient Instructions (Signed)
[facility] Discharge Instructions for Patients Receiving Chemotherapy  Today you received the following chemotherapy agents: Abraxane and Carboplatin  To help prevent nausea and vomiting after your treatment, we encourage you to take your nausea medication as prescribed by your physician.   If you develop nausea and vomiting that is not controlled by your nausea medication, call the clinic.   BELOW ARE SYMPTOMS THAT SHOULD BE REPORTED IMMEDIATELY:  *FEVER GREATER THAN 100.5 F  *CHILLS WITH OR WITHOUT FEVER  NAUSEA AND VOMITING THAT IS NOT CONTROLLED WITH YOUR NAUSEA MEDICATION  *UNUSUAL SHORTNESS OF BREATH  *UNUSUAL BRUISING OR BLEEDING  TENDERNESS IN MOUTH AND THROAT WITH OR WITHOUT PRESENCE OF ULCERS  *URINARY PROBLEMS  *BOWEL PROBLEMS  UNUSUAL RASH Items with * indicate a potential emergency and should be followed up as soon as possible.  Feel free to call the clinic you have any questions or concerns. The clinic phone number is [phone].

## 2013-03-25 ENCOUNTER — Other Ambulatory Visit: Payer: Medicare HMO

## 2013-03-25 ENCOUNTER — Ambulatory Visit: Payer: Medicare HMO

## 2013-03-28 ENCOUNTER — Telehealth: Payer: Self-pay | Admitting: *Deleted

## 2013-03-28 MED ORDER — ONDANSETRON HCL 4 MG PO TABS: 4.0000 mg | ORAL_TABLET | Freq: Three times a day (TID) | ORAL | Status: DC | PRN

## 2013-03-28 NOTE — Telephone Encounter (Signed)
Patient's spouse phoned requesting script for patient to have Zofran-is experiencing n/v and wife states compazine makes patient too sedated/drowsy.  Please advise.  Last OV with PCP 03/22/13.  Oncologist prescribed phenergan, but patient & spouse prefer to ask you for Zofran.  N/V secondary to chemo.  Please advise.  CB# [phone]

## 2013-03-28 NOTE — Telephone Encounter (Signed)
rx sent to pharmacy: zofran 40 mg #90, 1 po q 4 prn

## 2013-03-28 NOTE — Telephone Encounter (Signed)
Phoned and notified spouse requested script had been sent to & received by pharmacy.

## 2013-03-29 ENCOUNTER — Ambulatory Visit (INDEPENDENT_AMBULATORY_CARE_PROVIDER_SITE_OTHER): Payer: Medicare HMO | Admitting: Pulmonary Disease

## 2013-03-29 ENCOUNTER — Other Ambulatory Visit: Payer: Self-pay | Admitting: *Deleted

## 2013-03-29 ENCOUNTER — Encounter: Payer: Self-pay | Admitting: Pulmonary Disease

## 2013-03-29 VITALS — BP 110/52 | HR 91 | Temp 97.0°F | Wt 165.8 lb

## 2013-03-29 DIAGNOSIS — C3490 Malignant neoplasm of unspecified part of unspecified bronchus or lung: Secondary | ICD-10-CM

## 2013-03-29 DIAGNOSIS — C3491 Malignant neoplasm of unspecified part of right bronchus or lung: Secondary | ICD-10-CM

## 2013-03-29 MED ORDER — LORAZEPAM 1 MG PO TABS: 1.0000 mg | ORAL_TABLET | Freq: Three times a day (TID) | ORAL | Status: DC | PRN

## 2013-03-29 NOTE — Progress Notes (Signed)
   Subjective:    Patient ID: Samuel Hurley, male    DOB: January 17, 1935, 79 y.o.   MRN: [id number]  HPI  PCP - [facility]   79 year old never smoker for FU of lung cancer & dyspnea  PMH -Underwent radical right neck dissection in 2008 for oropharyngeal cancer and subsequently received chemotherapy and radiation.  renal tumor status post radiofrequency ablation at [facility] in August of 2008.  PFT 2014 FEV1 of 80%, FVC was 72%, smaller was were decreased at 68% and improved to 102% with bronchodilator. Diffusion capacity was normal.   He presented and 2014 with [place] moderate right pleural effusion with associated pleural thickening/nodularity.  Underlying compressive atelectasis of the right middle and lower lobes. Endobronchial biopsy showed Extensive stage small cell lung cancer  in July 2014.he underwent chemotherapy with Dr. Julien Nordmann , was then referred to [facility] due to limited response. Repeat biopsy of a right pleural based nodule in December 2014 showed poorly differentiated non-small cell carcinoma.  CT chest and abdomen in February 2015 showed locally advanced disease with progression in the chest but no disease identified outside the chest. He he has completed cycle 1 of second line chemotherapy for non-small cell cancer and is feeling miserable. Lost 13 lbs in 2 weeks He is in a wheelchair He asked me for advice  Past Medical History  Diagnosis Date  . Hyperlipidemia   . OSA (obstructive sleep apnea)   . BPH (benign prostatic hypertrophy)   . Kidney disease     TCC  . Xerostomia   . Pharynx cancer     squamous cell stage 4,s/p XRT,chemo, neck dissection  . Colon polyp     HYPERPLASTIC & TUBULAR ADENOMA(Colonoscopy-Dr.[place])  . Diverticulosis of colon (without mention of hemorrhage)     (Colonoscopy-Dr.[place])  . Internal hemorrhoids without mention of complication     (Colonoscopy-Dr.)  . GERD (gastroesophageal reflux disease)     (EGD-Dr.  Velora Heckler)  . Atrophic gastritis without mention of hemorrhage     (EGD-Dr. Velora Heckler)  . History of radiation therapy      Review of Systems neg for any significant sore throat, dysphagia, itching, sneezing, nasal congestion or excess/ purulent secretions, fever, chills, sweats, unintended wt loss, pleuritic or exertional cp, hempoptysis, orthopnea pnd or change in chronic leg swelling. Also denies presyncope, palpitations, heartburn, abdominal pain, nausea, vomiting, diarrhea or change in bowel or urinary habits, dysuria,hematuria, rash, arthralgias, visual complaints, headache, numbness weakness or ataxia.     Objective:   Physical Exam  Gen. Pleasant, well-nourished, in no distress, normal affect ENT - no lesions, no post nasal drip Neck: No JVD, no thyromegaly, no carotid bruits Lungs: no use of accessory muscles, no dullness to percussion, clear without rales or rhonchi  Cardiovascular: Rhythm regular, heart sounds  normal, no murmurs or gallops, no peripheral edema Abdomen: soft and non-tender, no hepatosplenomegaly, BS normal. Musculoskeletal: No deformities, no cyanosis or clubbing Neuro:  alert, non focal       Assessment & Plan:

## 2013-03-29 NOTE — Assessment & Plan Note (Signed)
He quite frankly asked me for advice He said that he was started feeling miserable. This is the first time I've seen him in a wheelchair. His wife was tearful. He received conflicting opinions about his prognosis. He is pursuing aggressive care now, and other oncologist asked him to go fishing. We discussed about the possibility of a feeding tube. I refocused him towards his quality of life, I reminded him of the conversation we had about quality of life the most important when we made the initial diagnosis. He would like to enjoy his food, not burp and be free of pain . I offered him hospice care as an alternative where we could focus more on symptom management. He ended the discussion abruptly. My only recommendation here is that we focus more on symptom management rather than chemotherapy.

## 2013-03-30 ENCOUNTER — Other Ambulatory Visit: Payer: Self-pay | Admitting: *Deleted

## 2013-03-30 ENCOUNTER — Telehealth: Payer: Self-pay | Admitting: *Deleted

## 2013-03-30 DIAGNOSIS — C3490 Malignant neoplasm of unspecified part of unspecified bronchus or lung: Secondary | ICD-10-CM

## 2013-03-30 MED ORDER — OXYCODONE-ACETAMINOPHEN 10-325 MG PO TABS: 1.0000 | ORAL_TABLET | ORAL | Status: DC | PRN

## 2013-03-30 NOTE — Telephone Encounter (Signed)
Pt's wife called stating she gave pt an oxycodone tablet and about 1 1/2 hours later gave him another tablet because his pain was not controlled.  He still has his fentanyl patch.  He had not been using his oxycodone 10/325 tablets at all but today he was c/o "pain all over".  He is not having any pain currently.  [name] wants to know how he can take his oxycodone.  Per AJ, we can give him oxy IR 15mg  q4h.  [name] states that she will give pt the percocet 10/325 no more then every 4 hours until he is seen in the clinic tomorrow.  SLJ

## 2013-03-31 ENCOUNTER — Telehealth: Payer: Self-pay | Admitting: *Deleted

## 2013-03-31 ENCOUNTER — Other Ambulatory Visit (HOSPITAL_BASED_OUTPATIENT_CLINIC_OR_DEPARTMENT_OTHER): Payer: Medicare HMO

## 2013-03-31 ENCOUNTER — Ambulatory Visit (HOSPITAL_BASED_OUTPATIENT_CLINIC_OR_DEPARTMENT_OTHER): Payer: Commercial Managed Care - HMO | Admitting: Physician Assistant

## 2013-03-31 ENCOUNTER — Ambulatory Visit: Payer: Commercial Managed Care - HMO

## 2013-03-31 ENCOUNTER — Ambulatory Visit (HOSPITAL_BASED_OUTPATIENT_CLINIC_OR_DEPARTMENT_OTHER): Payer: Commercial Managed Care - HMO

## 2013-03-31 ENCOUNTER — Other Ambulatory Visit: Payer: Self-pay | Admitting: *Deleted

## 2013-03-31 ENCOUNTER — Other Ambulatory Visit: Payer: Medicare HMO

## 2013-03-31 ENCOUNTER — Encounter: Payer: Self-pay | Admitting: Physician Assistant

## 2013-03-31 ENCOUNTER — Ambulatory Visit: Payer: Medicare HMO

## 2013-03-31 VITALS — BP 105/58 | HR 91 | Temp 97.6°F

## 2013-03-31 VITALS — BP 109/59 | HR 85 | Temp 97.3°F | Resp 19 | Ht 72.0 in | Wt 161.0 lb

## 2013-03-31 DIAGNOSIS — E860 Dehydration: Secondary | ICD-10-CM

## 2013-03-31 DIAGNOSIS — Z95828 Presence of other vascular implants and grafts: Secondary | ICD-10-CM

## 2013-03-31 DIAGNOSIS — R1310 Dysphagia, unspecified: Secondary | ICD-10-CM

## 2013-03-31 DIAGNOSIS — C342 Malignant neoplasm of middle lobe, bronchus or lung: Secondary | ICD-10-CM

## 2013-03-31 DIAGNOSIS — C3491 Malignant neoplasm of unspecified part of right bronchus or lung: Secondary | ICD-10-CM

## 2013-03-31 DIAGNOSIS — Z85810 Personal history of malignant neoplasm of tongue: Secondary | ICD-10-CM

## 2013-03-31 DIAGNOSIS — E46 Unspecified protein-calorie malnutrition: Secondary | ICD-10-CM

## 2013-03-31 LAB — CBC WITH DIFFERENTIAL/PLATELET
BASO%: 0.3 % (ref 0.0–2.0)
BASOS ABS: 0 10*3/uL (ref 0.0–0.1)
EOS%: 2.2 % (ref 0.0–7.0)
Eosinophils Absolute: 0.1 10*3/uL (ref 0.0–0.5)
HEMATOCRIT: 25.3 % — AB (ref 38.4–49.9)
HEMOGLOBIN: 8.1 g/dL — AB (ref 13.0–17.1)
LYMPH%: 6.5 % — ABNORMAL LOW (ref 14.0–49.0)
MCH: 28 pg (ref 27.2–33.4)
MCHC: 32 g/dL (ref 32.0–36.0)
MCV: 87.5 fL (ref 79.3–98.0)
MONO#: 0.4 10*3/uL (ref 0.1–0.9)
MONO%: 8.3 % (ref 0.0–14.0)
NEUT%: 82.7 % — AB (ref 39.0–75.0)
NEUTROS ABS: 3.8 10*3/uL (ref 1.5–6.5)
Platelets: 185 10*3/uL (ref 140–400)
RBC: 2.89 10*6/uL — ABNORMAL LOW (ref 4.20–5.82)
RDW: 17 % — ABNORMAL HIGH (ref 11.0–14.6)
WBC: 4.6 10*3/uL (ref 4.0–10.3)
lymph#: 0.3 10*3/uL — ABNORMAL LOW (ref 0.9–3.3)

## 2013-03-31 LAB — COMPREHENSIVE METABOLIC PANEL (CC13)
ALBUMIN: 2.1 g/dL — AB (ref 3.5–5.0)
ALT: 14 U/L (ref 0–55)
ANION GAP: 12 meq/L — AB (ref 3–11)
AST: 10 U/L (ref 5–34)
Alkaline Phosphatase: 141 U/L (ref 40–150)
BUN: 20.7 mg/dL (ref 7.0–26.0)
CALCIUM: 9.1 mg/dL (ref 8.4–10.4)
CO2: 27 meq/L (ref 22–29)
CREATININE: 0.9 mg/dL (ref 0.7–1.3)
Chloride: 100 mEq/L (ref 98–109)
Glucose: 108 mg/dl (ref 70–140)
Potassium: 3.4 mEq/L — ABNORMAL LOW (ref 3.5–5.1)
Sodium: 139 mEq/L (ref 136–145)
Total Bilirubin: 0.37 mg/dL (ref 0.20–1.20)
Total Protein: 6.5 g/dL (ref 6.4–8.3)

## 2013-03-31 MED ORDER — DEXTROSE-NACL 5-0.45 % IV SOLN
Freq: Once | INTRAVENOUS | Status: AC
  Administered 2013-03-31: 13:00:00 via INTRAVENOUS

## 2013-03-31 MED ORDER — HEPARIN SOD (PORK) LOCK FLUSH 100 UNIT/ML IV SOLN
500.0000 [IU] | Freq: Once | INTRAVENOUS | Status: AC
  Administered 2013-03-31: 500 [IU] via INTRAVENOUS
  Filled 2013-03-31: qty 5

## 2013-03-31 MED ORDER — SODIUM CHLORIDE 0.9 % IJ SOLN
10.0000 mL | Freq: Once | INTRAMUSCULAR | Status: AC
  Administered 2013-03-31: 10 mL via INTRAVENOUS
  Filled 2013-03-31: qty 10

## 2013-03-31 MED ORDER — SODIUM CHLORIDE 0.9 % IJ SOLN
10.0000 mL | INTRAMUSCULAR | Status: DC | PRN
  Administered 2013-03-31: 10 mL via INTRAVENOUS
  Filled 2013-03-31: qty 10

## 2013-03-31 NOTE — Progress Notes (Addendum)
[facility] Telephone:([phone]) [phone]   [phone]  OFFICE VISIT PROGRESS NOTE  [name], MD [address] [place] [address]  DIAGNOSIS AND STAGE:  1) Poorly differentiated non-small cell lung cancer diagnosed in December of 2014 2) Extensive stage small cell lung cancer diagnosed in July 2014.  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 4)  History of oncocytic renal tumor status post radiofrequency ablation at [facility] in August of 2008.  PRIOR THERAPY: Systemic chemotherapy with carboplatin for AUC of 5 on day 1 and etoposide 120 mg/M2 on days 1, 2 and 3 with Neulasta support on day 4, status post 4 cycles. First dose was given on 08/18/2012.    CURRENT THERAPY: Systemic chemotherapy with carboplatin for AUC of 5 on day 1 and Abraxane 100 mg/M2 on days 1, 8 and 15 every 3 weeks. Status post 1 cycle as well as day 1 of cycle #2.  INTERVAL HISTORY: Samuel Hurley 79 y.o. male returns to the clinic today for followup visit accompanied by his wife Samuel Hurley.    He  underwent biopsy of one of the right pleural-based nodule at [facility] and the final pathology was consistent with poorly differentiated non-small cell carcinoma. The tumor cells are of small size with hyperchromatic nuclei and some molded forms. Many of the cells have abundant cytoplasm and nuclei with prominent nucleoli. Strong immunoreactivity is observed for anti-keratins and CK5/6, with negative staining observed for calretinin, WT-1, CK7, desmin, TTF-1 , Fli-1, CD99 and CD56. The entirety of these findings is most consistent with non-small carcinoma, with immunohistochemical evidence of rudimentary squamous differentiation. Bright pankeratin/ CK5/6  immunoreactivity with negative staining for TTF-1 and CD56 is not typical of small cell carcinoma.  He was seen recently by Dr. Sharlet Salina who sent him to be treated locally for the recently diagnosed non-small cell carcinoma as the patient is not a candidate for the clinical trial with Ipilumomab and Nivolumab for the second line option for the previously diagnosed small cell lung cancer. The patient was also seen recently at [facility] by Dr. Lewanda Rife who discussed within palliative systemic chemotherapy for his condition. The patient is not accepting the fact that he has an incurable condition and he still shopping around for other treatment options. He also requested a referral to the [facility]. He came today for evaluation and discussion of his treatment options after the recent diagnosis of non-small cell carcinoma.   The molecular biomarkers performed at [facility] were negative for EGFR mutation and the ALK gene translocation.   He is currently being treated with systemic chemotherapy in the form of carboplatin and Abraxane status post 1 cycle as well as day 1 of cycle #2. He presents received with day 8 of cycle #2. He complains of increased pain that he describes is generalized but worse in the right lower rib cage that extends across his abdomen. He states it is "head hurts" and has had left I blurred vision for the past 6 months although this is the first time that he has mentioned this to [place]. He complains of generalized malaise. He's had significant decreased by mouth intake. He states that he gets choked with swallowing. He has difficulty chewing and swallowing. He  currently sleeps in a recliner. He's had nausea and vomiting although the nausea is well-controlled with Zofran. He had one episode of increased temperature with a MAXIMUM TEMPERATURE of 100.9 but does not recur port any further temperature elevations. Concerning, is the fact that he has recently had  several falls over the past week. He's also had a problem with constipation. He did not get satisfactory relieved with Senokot or MiraLAX. He has been utilizing magnesium citrate. He has lost 17 pounds since 03/17/2013.   MEDICAL HISTORY: Past Medical History  Diagnosis Date  . Hyperlipidemia   . OSA (obstructive sleep apnea)   . BPH (benign prostatic hypertrophy)   . Kidney disease     TCC  . Xerostomia   . Pharynx cancer     squamous cell stage 4,s/p XRT,chemo, neck dissection  . Colon polyp     HYPERPLASTIC & TUBULAR ADENOMA(Colonoscopy-Dr.[place])  . Diverticulosis of colon (without mention of hemorrhage)     (Colonoscopy-Dr.[place])  . Internal hemorrhoids without mention of complication     (Colonoscopy-Dr.[place])  . GERD (gastroesophageal reflux disease)     (EGD-Dr. Velora Heckler)  . Atrophic gastritis without mention of hemorrhage     (EGD-Dr. Velora Heckler)  . History of radiation therapy     ALLERGIES:  is allergic to erythromycin and tetracycline.  MEDICATIONS:  Current Outpatient Prescriptions  Medication Sig Dispense Refill  . acetaminophen (TYLENOL) 500 MG tablet Take by mouth.      . Alum & Mag Hydroxide-Simeth (MAGIC MOUTHWASH) SOLN Take 5 mLs by mouth. Pt is unsure if there is lidocaine in it.  Takes once daily      . fentaNYL (DURAGESIC - DOSED MCG/HR) 50 MCG/HR Place 1 patch (50 mcg total) onto the skin every 3 (three) days.  10 patch  0  . finasteride (PROSCAR) 5 MG tablet Take 5 mg by mouth at bedtime.       Marland Kitchen levothyroxine (SYNTHROID, LEVOTHROID) 75 MCG tablet Take 75 mcg by mouth daily before breakfast.       . LORazepam (ATIVAN) 1 MG tablet Take 1 tablet (1 mg total) by mouth every 8 (eight) hours as needed for anxiety.  30 tablet  0  . magnesium citrate (GNP MAGNESIUM CITRATE) SOLN Take by mouth.      Marland Kitchen NASONEX 50 MCG/ACT nasal spray PLACE 2 SPRAYS INTO EACH NOSTRIL DAILY.  17 g  11  . ondansetron (ZOFRAN) 4 MG tablet Take 1 tablet (4 mg total) by mouth every 8  (eight) hours as needed for nausea or vomiting.  90 tablet  2  . oxyCODONE-acetaminophen (PERCOCET) 10-325 MG per tablet Take 1 tablet by mouth every 4 (four) hours as needed for pain.  60 tablet  0  . sertraline (ZOLOFT) 100 MG tablet Take 1 tablet (100 mg total) by mouth daily.  30 tablet  prn  . furosemide (LASIX) 20 MG tablet TAKE (1) TABLET DAILY AS NEEDED FOR SWELLING.  30 tablet  0  . megestrol (MEGACE ORAL) 40 MG/ML suspension Take 10 cc daily for appetite  400 mL  0  . prochlorperazine (COMPAZINE) 10 MG tablet Take 10 mg by mouth every 6 (six) hours as needed for nausea or vomiting.      . terazosin (HYTRIN) 1 MG capsule Take 1 mg by mouth 2 (two) times daily.        Current Facility-Administered Medications  Medication Dose Route Frequency Provider Last Rate Last Dose  . TDaP (BOOSTRIX) injection 0.5 mL  0.5 mL Intramuscular Once  Neena Rhymes, MD        SURGICAL HISTORY:  Past Surgical History  Procedure Laterality Date  . Appendectomy    . Lumbar laminectomy    . Tonsillectomy    . Bilateral ganglionectomies    . Radical right neck dissection  2008  . Pci-rfa renal cell (tcc) cancer  2009    pt denies  . Cholecystectomy  dec. 2009  . Septoplasty  03/01/12    with double turbinectomy  . Video bronchoscopy Bilateral 08/10/2012    Procedure: VIDEO BRONCHOSCOPY WITH FLUORO;  Surgeon: Rigoberto Noel, MD;  Location: [facility];  Service: Cardiopulmonary;  Laterality: Bilateral;  . Shoulder arthroscopy Left 2013  . Thoracentesis Right 10/2012    REVIEW OF SYSTEMS:  Constitutional: positive for anorexia, fatigue, malaise and weight loss Eyes: positive for visual disturbance Ears, nose, mouth, throat, and face: negative Respiratory: positive for pleurisy/chest pain Cardiovascular: negative Gastrointestinal: positive for constipation, dysphagia, nausea and vomiting Genitourinary:negative Integument/breast: negative Hematologic/lymphatic: negative Musculoskeletal:positive  for back pain Neurological: negative Behavioral/Psych: positive for anxiety, bad mood, depression, irritability and mood swings Endocrine: negative Allergic/Immunologic: negative   PHYSICAL EXAMINATION: General appearance: alert, cooperative, appears older than stated age, fatigued and no distress Head: Normocephalic, without obvious abnormality, atraumatic Neck: no adenopathy and supple, symmetrical, trachea midline Lymph nodes: Cervical, supraclavicular, and axillary nodes normal. Resp: clear to auscultation bilaterally Back: symmetric, no curvature. ROM normal. No CVA tenderness. Cardio: regular rate and rhythm, S1, S2 normal, no murmur, click, rub or gallop GI: soft, non-tender; bowel sounds normal; no masses,  no organomegaly Extremities: extremities normal, atraumatic, no cyanosis or edema Neurologic: Alert and oriented X 3, normal strength and tone. Normal symmetric reflexes. Normal coordination and gait   ECOG PERFORMANCE STATUS: 1 - Symptomatic but completely ambulatory  Blood pressure 109/59, pulse 85, temperature 97.3 F (36.3 C), temperature source Oral, resp. rate 19, height 6' (1.829 m), weight 161 lb (73.029 kg).  LABORATORY DATA: Lab Results  Component Value Date   WBC 4.6 03/31/2013   HGB 8.1* 03/31/2013   HCT 25.3* 03/31/2013   MCV 87.5 03/31/2013   PLT 185 03/31/2013      Chemistry      Component Value Date/Time   NA 139 03/31/2013 1134   NA 135 12/11/2012 1220   K 3.4* 03/31/2013 1134   K 4.1 12/11/2012 1220   CL 99 12/11/2012 1220   CO2 27 03/31/2013 1134   CO2 23 12/11/2012 1220   BUN 20.7 03/31/2013 1134   BUN 17 12/11/2012 1220   CREATININE 0.9 03/31/2013 1134   CREATININE 0.99 12/11/2012 1220      Component Value Date/Time   CALCIUM 9.1 03/31/2013 1134   CALCIUM 9.4 12/11/2012 1220   ALKPHOS 141 03/31/2013 1134   ALKPHOS 76 05/13/2012 1005   AST 10 03/31/2013 1134   AST 16 05/13/2012 1005   ALT 14 03/31/2013 1134   ALT 11 05/13/2012 1005   BILITOT 0.37  03/31/2013 1134   BILITOT 0.5 05/13/2012 1005       RADIOGRAPHIC STUDIES:  ASSESSMENT AND PLAN: This is another very extensive and long visit with the patient and his wife today. The patient is a 6 years old white male who was diagnosed with extensive stage small cell lung cancer status post systemic chemotherapy with carboplatin and etoposide with progressive disease on the right side of the chest. He was recently seen at [facility] by Dr. Sharlet Salina and was considered for a clinical trial  with Ipilumomab and Nivolumab.  Recent repeat biopsy of a right pleural based nodule showed poorly differentiated non-small cell carcinoma. He is currently undergoing systemic chemotherapy with carboplatin and Abraxane status post 1 cycle as well as day 1 of cycle #2. The patient was discussed with and also seen by Dr. Julien Nordmann. We will put chemotherapy on hold for now. Today he works receive one liter of D5 half normal saline in the infusion area today. He'll return tomorrow for reevaluation.  For the pain management, weakening to increase his fentanyl patch to 75 mcg and or change his short acting pain medication to 15 mg OxyIR tablet to be taken 1 tablet every 4-6 hours as needed for breakthrough pain. [name] would like to consider these options prior to making a change.  He was advised to call immediately if he has any concerning symptoms in the interval. The patient voices understanding of current disease status and treatment options and is in agreement with the current care plan.  All questions were answered. The patient knows to call the clinic with any problems, questions or concerns. We can certainly see the patient much sooner if necessary.  Carlton Adam, PA-C 03/31/2013  ADDENDUM: Hematology/Oncology Attending: I had a face to face encounter with the patient. I recommended his care plan. This is a very pleasant 79 years old white male with history of  non-small cell lung cancer and currently undergoing treatment for poorly differentiated non-small cell lung cancer with carboplatin and Abraxane status post 1 cycle and currently undergoing cycle #2. The patient has been complaining of increasing weakness and fatigue, lack of appetite as well as dehydration and dysphagia. He lost around 17 pounds over the last few weeks. I have a lengthy discussion with the patient and his wife today about his condition. I recommended for the patient to hold his systemic chemotherapy for now. I will arrange for the patient to receive IV hydration at the [facility] today. We will reevaluate him tomorrow and if he has no improvement in his condition I would consider sending the patient to [facility] for admission and further management of his condition. For pain management the patient will continue on his current treatment for now. The patient and his wife agreed to the current plan. He was advised to call immediately if he has any concerning symptoms in the interval.   Disclaimer: This note was dictated with voice recognition software. Similar sounding words can inadvertently be transcribed and may not be corrected upon review. Eilleen Kempf., MD 04/02/2013

## 2013-03-31 NOTE — Patient Instructions (Signed)
We will hold your chemotherapy for now and arrange to give the IV fluids today. Return tomorrow for reevaluation

## 2013-03-31 NOTE — Progress Notes (Signed)
Per Samuel Hurley, okay to leave patient's PAC accessed for tomorrow's admission. Dressing changed, clean/dry/intact with antimicrobial disc in place. Patient and wife voice understanding to keep dressing clean and dry

## 2013-03-31 NOTE — Telephone Encounter (Signed)
Called and informed patient's wife that Dr. Julien Nordmann would like to see patient at 0930 tomorrow morning.  Also, informed patient's wife that the plan is still to admit patient to the hospital.  Per Awilda Metro.  Patient's wife verbalized understanding.

## 2013-03-31 NOTE — Patient Instructions (Signed)

## 2013-04-01 ENCOUNTER — Encounter (HOSPITAL_COMMUNITY): Payer: Self-pay | Admitting: Internal Medicine

## 2013-04-01 ENCOUNTER — Other Ambulatory Visit: Payer: Medicare HMO

## 2013-04-01 ENCOUNTER — Ambulatory Visit (HOSPITAL_BASED_OUTPATIENT_CLINIC_OR_DEPARTMENT_OTHER): Payer: Commercial Managed Care - HMO | Admitting: Physician Assistant

## 2013-04-01 ENCOUNTER — Encounter: Payer: Self-pay | Admitting: Physician Assistant

## 2013-04-01 ENCOUNTER — Ambulatory Visit: Payer: Medicare HMO

## 2013-04-01 ENCOUNTER — Ambulatory Visit (HOSPITAL_BASED_OUTPATIENT_CLINIC_OR_DEPARTMENT_OTHER): Payer: Commercial Managed Care - HMO

## 2013-04-01 ENCOUNTER — Other Ambulatory Visit: Payer: Self-pay | Admitting: *Deleted

## 2013-04-01 ENCOUNTER — Telehealth: Payer: Self-pay | Admitting: Internal Medicine

## 2013-04-01 ENCOUNTER — Inpatient Hospital Stay (HOSPITAL_COMMUNITY)
Admission: AD | Admit: 2013-04-01 | Discharge: 2013-04-05 | DRG: 640 | Disposition: A | Discharge status: Home Health | Payer: Medicare HMO | Source: Ambulatory Visit | Attending: Internal Medicine | Admitting: Internal Medicine

## 2013-04-01 VITALS — BP 99/60 | HR 77 | Temp 96.9°F

## 2013-04-01 DIAGNOSIS — Z66 Do not resuscitate: Secondary | ICD-10-CM | POA: Diagnosis present

## 2013-04-01 DIAGNOSIS — D630 Anemia in neoplastic disease: Secondary | ICD-10-CM | POA: Diagnosis present

## 2013-04-01 DIAGNOSIS — K5900 Constipation, unspecified: Secondary | ICD-10-CM | POA: Diagnosis present

## 2013-04-01 DIAGNOSIS — E785 Hyperlipidemia, unspecified: Secondary | ICD-10-CM | POA: Diagnosis present

## 2013-04-01 DIAGNOSIS — G4730 Sleep apnea, unspecified: Secondary | ICD-10-CM

## 2013-04-01 DIAGNOSIS — IMO0002 Reserved for concepts with insufficient information to code with codable children: Secondary | ICD-10-CM

## 2013-04-01 DIAGNOSIS — Z860100 Personal history of colon polyps, unspecified: Secondary | ICD-10-CM

## 2013-04-01 DIAGNOSIS — G4733 Obstructive sleep apnea (adult) (pediatric): Secondary | ICD-10-CM | POA: Diagnosis present

## 2013-04-01 DIAGNOSIS — G893 Neoplasm related pain (acute) (chronic): Secondary | ICD-10-CM

## 2013-04-01 DIAGNOSIS — C3490 Malignant neoplasm of unspecified part of unspecified bronchus or lung: Secondary | ICD-10-CM

## 2013-04-01 DIAGNOSIS — R5383 Other fatigue: Secondary | ICD-10-CM

## 2013-04-01 DIAGNOSIS — Z79899 Other long term (current) drug therapy: Secondary | ICD-10-CM

## 2013-04-01 DIAGNOSIS — C342 Malignant neoplasm of middle lobe, bronchus or lung: Secondary | ICD-10-CM

## 2013-04-01 DIAGNOSIS — S139XXA Sprain of joints and ligaments of unspecified parts of neck, initial encounter: Secondary | ICD-10-CM

## 2013-04-01 DIAGNOSIS — F410 Panic disorder [episodic paroxysmal anxiety] without agoraphobia: Secondary | ICD-10-CM | POA: Diagnosis present

## 2013-04-01 DIAGNOSIS — Z85810 Personal history of malignant neoplasm of tongue: Secondary | ICD-10-CM

## 2013-04-01 DIAGNOSIS — R410 Disorientation, unspecified: Secondary | ICD-10-CM | POA: Diagnosis present

## 2013-04-01 DIAGNOSIS — F329 Major depressive disorder, single episode, unspecified: Secondary | ICD-10-CM | POA: Diagnosis present

## 2013-04-01 DIAGNOSIS — E669 Obesity, unspecified: Secondary | ICD-10-CM

## 2013-04-01 DIAGNOSIS — R296 Repeated falls: Secondary | ICD-10-CM

## 2013-04-01 DIAGNOSIS — R627 Adult failure to thrive: Secondary | ICD-10-CM | POA: Diagnosis present

## 2013-04-01 DIAGNOSIS — D649 Anemia, unspecified: Secondary | ICD-10-CM

## 2013-04-01 DIAGNOSIS — D638 Anemia in other chronic diseases classified elsewhere: Secondary | ICD-10-CM | POA: Diagnosis present

## 2013-04-01 DIAGNOSIS — R1011 Right upper quadrant pain: Secondary | ICD-10-CM

## 2013-04-01 DIAGNOSIS — Z8249 Family history of ischemic heart disease and other diseases of the circulatory system: Secondary | ICD-10-CM

## 2013-04-01 DIAGNOSIS — D6481 Anemia due to antineoplastic chemotherapy: Secondary | ICD-10-CM | POA: Diagnosis present

## 2013-04-01 DIAGNOSIS — Z9181 History of falling: Secondary | ICD-10-CM

## 2013-04-01 DIAGNOSIS — E43 Unspecified severe protein-calorie malnutrition: Secondary | ICD-10-CM | POA: Diagnosis present

## 2013-04-01 DIAGNOSIS — R1310 Dysphagia, unspecified: Secondary | ICD-10-CM | POA: Diagnosis present

## 2013-04-01 DIAGNOSIS — T451X5A Adverse effect of antineoplastic and immunosuppressive drugs, initial encounter: Secondary | ICD-10-CM | POA: Diagnosis present

## 2013-04-01 DIAGNOSIS — N400 Enlarged prostate without lower urinary tract symptoms: Secondary | ICD-10-CM | POA: Diagnosis present

## 2013-04-01 DIAGNOSIS — Z85118 Personal history of other malignant neoplasm of bronchus and lung: Secondary | ICD-10-CM

## 2013-04-01 DIAGNOSIS — E860 Dehydration: Principal | ICD-10-CM | POA: Diagnosis present

## 2013-04-01 DIAGNOSIS — E876 Hypokalemia: Secondary | ICD-10-CM | POA: Diagnosis present

## 2013-04-01 DIAGNOSIS — C3491 Malignant neoplasm of unspecified part of right bronchus or lung: Secondary | ICD-10-CM

## 2013-04-01 DIAGNOSIS — J90 Pleural effusion, not elsewhere classified: Secondary | ICD-10-CM

## 2013-04-01 DIAGNOSIS — R5381 Other malaise: Secondary | ICD-10-CM

## 2013-04-01 DIAGNOSIS — F3289 Other specified depressive episodes: Secondary | ICD-10-CM | POA: Diagnosis present

## 2013-04-01 DIAGNOSIS — R52 Pain, unspecified: Secondary | ICD-10-CM

## 2013-04-01 DIAGNOSIS — C760 Malignant neoplasm of head, face and neck: Secondary | ICD-10-CM | POA: Diagnosis present

## 2013-04-01 DIAGNOSIS — E039 Hypothyroidism, unspecified: Secondary | ICD-10-CM | POA: Diagnosis present

## 2013-04-01 DIAGNOSIS — G8929 Other chronic pain: Secondary | ICD-10-CM | POA: Diagnosis present

## 2013-04-01 DIAGNOSIS — Z923 Personal history of irradiation: Secondary | ICD-10-CM

## 2013-04-01 DIAGNOSIS — R531 Weakness: Secondary | ICD-10-CM | POA: Diagnosis present

## 2013-04-01 DIAGNOSIS — R0600 Dyspnea, unspecified: Secondary | ICD-10-CM

## 2013-04-01 DIAGNOSIS — K219 Gastro-esophageal reflux disease without esophagitis: Secondary | ICD-10-CM | POA: Diagnosis present

## 2013-04-01 DIAGNOSIS — R634 Abnormal weight loss: Secondary | ICD-10-CM

## 2013-04-01 DIAGNOSIS — K117 Disturbances of salivary secretion: Secondary | ICD-10-CM | POA: Diagnosis present

## 2013-04-01 HISTORY — DX: Malignant neoplasm of unspecified part of unspecified bronchus or lung: C34.90

## 2013-04-01 LAB — CREATININE, SERUM
Creatinine, Ser: 0.89 mg/dL (ref 0.50–1.35)
GFR calc Af Amer: >90 mL/min (ref >90)
GFR calc non Af Amer: 81 mL/min — ABNORMAL LOW (ref >90)

## 2013-04-01 LAB — CBC
HCT: 22.7 % — ABNORMAL LOW (ref 39.0–52.0)
Hemoglobin: 7.3 g/dL — ABNORMAL LOW (ref 13.0–17.0)
MCH: 27.7 pg (ref 26.0–34.0)
MCHC: 32.2 g/dL (ref 30.0–36.0)
MCV: 86 fL (ref 78.0–100.0)
Platelets: 169 10*3/uL (ref 150–400)
RBC: 2.64 MIL/uL — AB (ref 4.22–5.81)
RDW: 16.8 % — ABNORMAL HIGH (ref 11.5–15.5)
WBC: 3.9 10*3/uL — ABNORMAL LOW (ref 4.0–10.5)

## 2013-04-01 MED ORDER — FENTANYL 50 MCG/HR TD PT72
50.0000 ug | MEDICATED_PATCH | TRANSDERMAL | Status: DC
  Administered 2013-04-01 – 2013-04-04 (×2): 50 ug via TRANSDERMAL
  Filled 2013-04-01 (×2): qty 1

## 2013-04-01 MED ORDER — SENNA 8.6 MG PO TABS
2.0000 | ORAL_TABLET | Freq: Every day | ORAL | Status: DC
  Filled 2013-04-01 (×2): qty 2

## 2013-04-01 MED ORDER — OXYCODONE-ACETAMINOPHEN 10-325 MG PO TABS: 1.0000 | ORAL_TABLET | ORAL | Status: DC | PRN

## 2013-04-01 MED ORDER — LEVOTHYROXINE SODIUM 75 MCG PO TABS
75.0000 ug | ORAL_TABLET | Freq: Every day | ORAL | Status: DC
  Administered 2013-04-02: 75 ug via ORAL
  Filled 2013-04-01 (×3): qty 1

## 2013-04-01 MED ORDER — FLUTICASONE PROPIONATE 50 MCG/ACT NA SUSP
1.0000 | Freq: Every day | NASAL | Status: DC
  Administered 2013-04-02 – 2013-04-03 (×2): 1 via NASAL
  Filled 2013-04-01: qty 16

## 2013-04-01 MED ORDER — SERTRALINE HCL 100 MG PO TABS
100.0000 mg | ORAL_TABLET | Freq: Every day | ORAL | Status: DC
  Administered 2013-04-02 – 2013-04-03 (×2): 100 mg via ORAL
  Filled 2013-04-01 (×5): qty 1

## 2013-04-01 MED ORDER — FINASTERIDE 5 MG PO TABS
5.0000 mg | ORAL_TABLET | Freq: Every day | ORAL | Status: DC
  Administered 2013-04-01 – 2013-04-03 (×3): 5 mg via ORAL
  Filled 2013-04-01 (×5): qty 1

## 2013-04-01 MED ORDER — DOCUSATE SODIUM 100 MG PO CAPS
200.0000 mg | ORAL_CAPSULE | Freq: Two times a day (BID) | ORAL | Status: DC
  Administered 2013-04-02 – 2013-04-03 (×2): 200 mg via ORAL
  Filled 2013-04-01 (×10): qty 2

## 2013-04-01 MED ORDER — KCL IN DEXTROSE-NACL 20-5-0.45 MEQ/L-%-% IV SOLN
INTRAVENOUS | Status: DC
  Administered 2013-04-01 – 2013-04-02 (×2): via INTRAVENOUS
  Administered 2013-04-03: 75 mL/h via INTRAVENOUS
  Administered 2013-04-04 – 2013-04-05 (×2): via INTRAVENOUS
  Filled 2013-04-01 (×8): qty 1000

## 2013-04-01 MED ORDER — BISACODYL 10 MG RE SUPP: 10.0000 mg | Freq: Every day | RECTAL | Status: DC | PRN

## 2013-04-01 MED ORDER — BIOTENE DRY MOUTH MT LIQD: 15.0000 mL | OROMUCOSAL | Status: DC | PRN

## 2013-04-01 MED ORDER — ENOXAPARIN SODIUM 40 MG/0.4ML ~~LOC~~ SOLN
40.0000 mg | Freq: Every day | SUBCUTANEOUS | Status: DC
  Administered 2013-04-02 – 2013-04-03 (×2): 40 mg via SUBCUTANEOUS
  Filled 2013-04-01 (×3): qty 0.4

## 2013-04-01 MED ORDER — MAGNESIUM CITRATE PO SOLN: 1.0000 | Freq: Once | ORAL | Status: AC | PRN

## 2013-04-01 MED ORDER — OXYCODONE HCL 5 MG PO TABS
5.0000 mg | ORAL_TABLET | ORAL | Status: DC | PRN
  Administered 2013-04-01 – 2013-04-03 (×4): 5 mg via ORAL
  Filled 2013-04-01 (×5): qty 1

## 2013-04-01 MED ORDER — OXYCODONE-ACETAMINOPHEN 5-325 MG PO TABS
1.0000 | ORAL_TABLET | ORAL | Status: DC | PRN
  Administered 2013-04-01 – 2013-04-03 (×4): 1 via ORAL
  Filled 2013-04-01 (×5): qty 1

## 2013-04-01 MED ORDER — PROCHLORPERAZINE MALEATE 10 MG PO TABS: 10.0000 mg | ORAL_TABLET | Freq: Four times a day (QID) | ORAL | Status: DC | PRN

## 2013-04-01 MED ORDER — TERAZOSIN HCL 1 MG PO CAPS
1.0000 mg | ORAL_CAPSULE | Freq: Two times a day (BID) | ORAL | Status: DC
  Administered 2013-04-01 – 2013-04-03 (×5): 1 mg via ORAL
  Filled 2013-04-01 (×10): qty 1

## 2013-04-01 MED ORDER — ACETAMINOPHEN 650 MG RE SUPP: 650.0000 mg | Freq: Four times a day (QID) | RECTAL | Status: DC | PRN

## 2013-04-01 MED ORDER — OXYCODONE-ACETAMINOPHEN 5-325 MG PO TABS: 2.0000 | ORAL_TABLET | Freq: Once | ORAL | Status: DC

## 2013-04-01 MED ORDER — ACETAMINOPHEN 325 MG PO TABS
650.0000 mg | ORAL_TABLET | Freq: Four times a day (QID) | ORAL | Status: DC | PRN
  Administered 2013-04-01: 650 mg via ORAL
  Filled 2013-04-01: qty 2

## 2013-04-01 MED ORDER — ONDANSETRON HCL 4 MG PO TABS
4.0000 mg | ORAL_TABLET | Freq: Three times a day (TID) | ORAL | Status: DC | PRN
  Administered 2013-04-01: 4 mg via ORAL
  Filled 2013-04-01: qty 1

## 2013-04-01 MED ORDER — SODIUM CHLORIDE 0.9 % IV SOLN
Freq: Once | INTRAVENOUS | Status: DC
  Administered 2013-04-01: 12:00:00 via INTRAVENOUS

## 2013-04-01 MED ORDER — LORAZEPAM 1 MG PO TABS
1.0000 mg | ORAL_TABLET | ORAL | Status: DC | PRN
  Administered 2013-04-01 – 2013-04-02 (×2): 1 mg via ORAL
  Filled 2013-04-01 (×3): qty 1

## 2013-04-01 MED ORDER — OXYCODONE-ACETAMINOPHEN 5-325 MG PO TABS
ORAL_TABLET | ORAL | Status: AC
  Filled 2013-04-01: qty 2

## 2013-04-01 NOTE — Progress Notes (Addendum)
[place] [phone]   Fax:(336) [place] NOTE  [name], MD [address] [place] [address]  DIAGNOSIS AND STAGE:  1) Poorly differentiated non-small cell lung cancer diagnosed in December of 2014 2) Extensive stage small cell lung cancer diagnosed in July 2014.  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 4)  History of oncocytic renal tumor status post radiofrequency ablation at [facility] in August of 2008.  PRIOR THERAPY: Systemic chemotherapy with carboplatin for AUC of 5 on day 1 and etoposide 120 mg/M2 on days 1, 2 and 3 with Neulasta support on day 4, status post 4 cycles. First dose was given on 08/18/2012.    CURRENT THERAPY: Systemic chemotherapy with carboplatin for AUC of 5 on day 1 and Abraxane 100 mg/M2 on days 1, 8 and 15 every 3 weeks. Status post 1 cycle as well as day 1 of cycle #2.  INTERVAL HISTORY: Samuel Hurley 79 y.o. male returns to the clinic today for followup visit accompanied by his wife [name].    He  underwent biopsy of one of the right pleural-based nodule at [facility] and the final pathology was consistent with poorly differentiated non-small cell carcinoma. The tumor cells are of small size with hyperchromatic nuclei and some molded forms. Many of the cells have abundant cytoplasm and nuclei with prominent nucleoli. Strong immunoreactivity is observed for anti-keratins and CK5/6, with negative staining observed for calretinin, WT-1, CK7, desmin, TTF-1 , Fli-1, CD99 and CD56. The entirety of these findings is most consistent with non-small carcinoma, with immunohistochemical evidence of rudimentary squamous differentiation. Bright pankeratin/ CK5/6  immunoreactivity with negative staining for TTF-1 and CD56 is not typical of small cell carcinoma.  He was seen recently by Dr. Sharlet Salina who sent him to be treated locally for the recently diagnosed non-small cell carcinoma as the patient is not a candidate for the clinical trial with Ipilumomab and Nivolumab for the second line option for the previously diagnosed small cell lung cancer. The patient was also seen recently at [facility] by Dr. Lewanda Rife who discussed within palliative systemic chemotherapy for his condition. The patient is not accepting the fact that he has an incurable condition and he still shopping around for other treatment options. He also requested a referral to the [facility]. He came today for evaluation and discussion of his treatment options after the recent diagnosis of non-small cell carcinoma.   The molecular biomarkers performed at [facility] were negative for EGFR mutation and the ALK gene translocation.   He returns today for reevaluation after receiving IV fluids D5 half-normal saline yesterday. He does not feel that the IV fluids were very helpful. He continues to complain of generalized pain and dysphagia. He has lost 17 pounds since 03/17/2013. He continues to complain of generalized increased pain, including headaches with left I blurred vision. He has recently had several falls. He complains of generalized malaise. He continues to have significant decreased by mouth intake. He states that he gets choked with swallowing. He has difficulty chewing and swallowing.   MEDICAL HISTORY: Past Medical History  Diagnosis Date  . Hyperlipidemia   . OSA (obstructive sleep apnea)   . BPH (benign prostatic hypertrophy)   .  Kidney disease     TCC  . Xerostomia   . Pharynx cancer     squamous cell stage 4,s/p XRT,chemo, neck dissection  . Colon polyp     HYPERPLASTIC & TUBULAR ADENOMA(Colonoscopy-Dr.Elmo)  . Diverticulosis of colon (without  mention of hemorrhage)     (Colonoscopy-Dr.Tohatchi)  . Internal hemorrhoids without mention of complication     (Colonoscopy-Dr.Holly)  . GERD (gastroesophageal reflux disease)     (EGD-Dr. Velora Heckler)  . Atrophic gastritis without mention of hemorrhage     (EGD-Dr. Velora Heckler)  . History of radiation therapy     ALLERGIES:  is allergic to erythromycin and tetracycline.  MEDICATIONS:  Current Outpatient Prescriptions  Medication Sig Dispense Refill  . acetaminophen (TYLENOL) 500 MG tablet Take by mouth.      . Alum & Mag Hydroxide-Simeth (MAGIC MOUTHWASH) SOLN Take 5 mLs by mouth. Pt is unsure if there is lidocaine in it.  Takes once daily      . fentaNYL (DURAGESIC - DOSED MCG/HR) 50 MCG/HR Place 1 patch (50 mcg total) onto the skin every 3 (three) days.  10 patch  0  . finasteride (PROSCAR) 5 MG tablet Take 5 mg by mouth at bedtime.       . furosemide (LASIX) 20 MG tablet TAKE (1) TABLET DAILY AS NEEDED FOR SWELLING.  30 tablet  0  . levothyroxine (SYNTHROID, LEVOTHROID) 75 MCG tablet Take 75 mcg by mouth daily before breakfast.       . LORazepam (ATIVAN) 1 MG tablet Take 1 tablet (1 mg total) by mouth every 8 (eight) hours as needed for anxiety.  30 tablet  0  . magnesium citrate (GNP MAGNESIUM CITRATE) SOLN Take by mouth.      . megestrol (MEGACE ORAL) 40 MG/ML suspension Take 10 cc daily for appetite  400 mL  0  . NASONEX 50 MCG/ACT nasal spray PLACE 2 SPRAYS INTO EACH NOSTRIL DAILY.  17 g  11  . ondansetron (ZOFRAN) 4 MG tablet Take 1 tablet (4 mg total) by mouth every 8 (eight) hours as needed for nausea or vomiting.  90 tablet  2  . oxyCODONE-acetaminophen (PERCOCET) 10-325 MG per tablet Take 1 tablet by mouth every 4 (four) hours as needed for pain.  60 tablet  0  . prochlorperazine (COMPAZINE) 10 MG tablet Take 10 mg by mouth every 6 (six) hours as needed for nausea or vomiting.      . sertraline (ZOLOFT) 100 MG tablet Take 1 tablet (100 mg total) by mouth daily.  30 tablet  prn   . terazosin (HYTRIN) 1 MG capsule Take 1 mg by mouth 2 (two) times daily.        Current Facility-Administered Medications  Medication Dose Route Frequency Provider Last Rate Last Dose  . oxyCODONE-acetaminophen (PERCOCET/ROXICET) 5-325 MG per tablet 2 tablet  2 tablet Oral Once Deoni Cosey E Terria Deschepper, PA-C      . TDaP (BOOSTRIX) injection 0.5 mL  0.5 mL Intramuscular Once Neena Rhymes, MD        SURGICAL HISTORY:  Past Surgical History  Procedure Laterality Date  . Appendectomy    . Lumbar laminectomy    . Tonsillectomy    . Bilateral ganglionectomies    . Radical right neck dissection  2008  . Pci-rfa renal cell (tcc) cancer  2009    pt denies  . Cholecystectomy  dec. 2009  . Septoplasty  03/01/12    with double turbinectomy  . Video bronchoscopy Bilateral 08/10/2012  Procedure: VIDEO BRONCHOSCOPY WITH FLUORO;  Surgeon: Rigoberto Noel, MD;  Location: [place];  Service: Cardiopulmonary;  Laterality: Bilateral;  . Shoulder arthroscopy Left 2013  . Thoracentesis Right 10/2012    REVIEW OF SYSTEMS:  Constitutional: positive for anorexia, fatigue, malaise and weight loss Eyes: positive for visual disturbance Ears, nose, mouth, throat, and face: negative Respiratory: positive for pleurisy/chest pain Cardiovascular: negative Gastrointestinal: positive for constipation, dysphagia, nausea and vomiting Genitourinary:negative Integument/breast: negative Hematologic/lymphatic: negative Musculoskeletal:positive for back pain Neurological: negative Behavioral/Psych: positive for anxiety, bad mood, depression, irritability and mood swings Endocrine: negative Allergic/Immunologic: negative   PHYSICAL EXAMINATION: General appearance: alert, cooperative, appears older than stated age, fatigued and no distress Head: Normocephalic, without obvious abnormality, atraumatic Neck: no adenopathy and supple, symmetrical, trachea midline Lymph nodes: Cervical, supraclavicular, and axillary  nodes normal. Resp: clear to auscultation bilaterally Back: symmetric, no curvature. ROM normal. No CVA tenderness. Cardio: regular rate and rhythm, S1, S2 normal, no murmur, click, rub or gallop GI: soft, non-tender; bowel sounds normal; no masses,  no organomegaly Extremities: extremities normal, atraumatic, no cyanosis or edema Neurologic: Alert and oriented X 3, normal strength and tone. Normal symmetric reflexes. Normal coordination and gait   ECOG PERFORMANCE STATUS: 1 - Symptomatic but completely ambulatory  Blood pressure 99/60, pulse 77, temperature 96.9 F (36.1 C), temperature source Oral.  LABORATORY DATA: Lab Results  Component Value Date   WBC 4.6 03/31/2013   HGB 8.1* 03/31/2013   HCT 25.3* 03/31/2013   MCV 87.5 03/31/2013   PLT 185 03/31/2013      Chemistry      Component Value Date/Time   NA 139 03/31/2013 1134   NA 135 12/11/2012 1220   K 3.4* 03/31/2013 1134   K 4.1 12/11/2012 1220   CL 99 12/11/2012 1220   CO2 27 03/31/2013 1134   CO2 23 12/11/2012 1220   BUN 20.7 03/31/2013 1134   BUN 17 12/11/2012 1220   CREATININE 0.9 03/31/2013 1134   CREATININE 0.99 12/11/2012 1220      Component Value Date/Time   CALCIUM 9.1 03/31/2013 1134   CALCIUM 9.4 12/11/2012 1220   ALKPHOS 141 03/31/2013 1134   ALKPHOS 76 05/13/2012 1005   AST 10 03/31/2013 1134   AST 16 05/13/2012 1005   ALT 14 03/31/2013 1134   ALT 11 05/13/2012 1005   BILITOT 0.37 03/31/2013 1134   BILITOT 0.5 05/13/2012 1005       RADIOGRAPHIC STUDIES:  ASSESSMENT AND PLAN: This is another very extensive and long visit with the patient and his wife today. The patient is a 79 years old white male who was diagnosed with extensive stage small cell lung cancer status post systemic chemotherapy with carboplatin and etoposide with progressive disease on the right side of the chest. He was recently seen at [place] by Dr. Sharlet Salina and was considered for a clinical trial with  Ipilumomab and Nivolumab.  Recent repeat biopsy of a right pleural based nodule showed poorly differentiated non-small cell carcinoma. He is currently undergoing systemic chemotherapy with carboplatin and Abraxane status post 1 cycle as well as day 1 of cycle #2. The patient was discussed with and also seen by Dr. Julien Nordmann. We will him speak with one of the hospitalist to arrange admission for further evaluation and management of his multiple issues E. new onset of frequent falls is concerning as is the of dysphasia and significant recent weight loss.  He may benefit from a GI consult for possible PEG  tube placement to aid with a nutrition in the face of his current malnutrition. He's currently experiencing pain the patient was given 25/325 mg Percocet tablets at 10:40 AM   He was advised to call immediately if he has any concerning symptoms in the interval. The patient voices understanding of current disease status and treatment options and is in agreement with the current care plan.  All questions were answered. The patient knows to call the clinic with any problems, questions or concerns. We can certainly see the patient much sooner if necessary.  Carlton Adam, PA-C 04/01/2013  ADDENDUM: Hematology/Oncology Attending: I had a face to face encounter with the patient. I recommended his care plan. The patient came to the clinic today for evaluation after receiving IV fluids yesterday. He has no significant improvement in his condition and continues to complain of the dysphagia as well as lack of appetite and weight loss. I will arrange for the patient to be admitted to [facility] for further evaluation of his condition and consideration of GI evaluation for PEG tube placement for nutrition. The patient and his wife agreed to the current plan.  Disclaimer: This note was dictated with voice recognition software. Similar sounding words can inadvertently be transcribed and may not be  corrected upon review. Eilleen Kempf., MD 04/02/2013

## 2013-04-01 NOTE — Patient Instructions (Signed)
You're going to be needed for further evaluation and management of her hypotension, dysphasia and pain management

## 2013-04-01 NOTE — H&P (Signed)
[facility] History and Physical  Samuel Hurley OXB:[id number] DOB: 1934/09/30 DOA: 04/01/2013  Referring physician:  Dr. Julien Nordmann PCP:  [name], MD   Chief Complaint:  Dehydration, failure to thrive   HPI:  The patient is a 79 y.o. year-old male with stage 4 squamous cell CA of the pharynx 2007, small cell lung cancer 07/2012, and NSCLC dx 12/2012 followed by Dr. Julien Nordmann who presents with inability to eat and dehydration.  The patient was last at their baseline health "a long time ago."  He was initially diagnosed with head and neck cancer in 2007 and was well until summer of 2014 at which time he was diagnosed with small cell lung cancer.  He underwent treatment and his cancer was responsive, however, he had a nodule in the right lung which did not respond to chemotherapy.  Biopsy of that nodule demonstrated NSCLC.  He has been on chemotherapy to treat his NSCLC and has received two cycles so far.  Because of his head and neck cancer, he has had problems with dry mouth and dysphagia and in the setting of chemotherapy, he has not been able to eat and drink much.  He has developed hypokalemia, dehydration, and 17-lb weight loss in the last month.  He was seen in clinic today and had borderline low blood pressure.  He is being directly admitted for dehydration and electrolyte repletion and to develop a plan for the further regarding goals of care and possible PEG tube placement.  Patient seems very frustrated by his multiple cancers and states he does not understand his cancer or his prognosis.  He states he has been asking for PEG tube placement and does not understand why it has taken so long for this desire to be addressed.  He is frustrated with his dry mouth and the fact that chemotherapy "about killed me."  He feels that the health care system does not seem supportive or patient-centered.  He has become very weak this week overall and has had 7 falls.    Review of Systems:  General:   Denies fevers, chills, 17-lb weight loss in last month HEENT:  Denies changes to hearing and vision, rhinorrhea, sinus congestion, sore throat CV:  Denies chest pain and palpitations, lower extremity edema.  PULM:  + SOB without wheeze, + cough.   GI:  Denies nausea, vomiting, diarrhea.  + constipation GU:  Denies dysuria, frequency, urgency ENDO:  Denies polyuria, polydipsia.   HEME:  Denies hematemesis, blood in stools, melena, abnormal bruising or bleeding.  LYMPH:  Denies lymphadenopathy.   MSK:  Denies arthralgias, myalgias.   DERM:  Denies skin rash or ulcer.   NEURO:  Denies focal numbness, weakness, slurred speech, confusion, facial droop.  PSYCH:  + anxiety and depression.    Past Medical History  Diagnosis Date  . Hyperlipidemia   . OSA (obstructive sleep apnea)   . BPH (benign prostatic hypertrophy)   . Kidney disease     TCC  . Xerostomia   . Pharynx cancer     squamous cell stage 4,s/p XRT,chemo, neck dissection  . Colon polyp     HYPERPLASTIC & TUBULAR ADENOMA(Colonoscopy-Dr.Keith)  . Diverticulosis of colon (without mention of hemorrhage)     (Colonoscopy-Dr.[place])  . Internal hemorrhoids without mention of complication     (Colonoscopy-Dr.Harding)  . GERD (gastroesophageal reflux disease)     (EGD-Dr. Velora Heckler)  . Atrophic gastritis without mention of hemorrhage     (EGD-Dr. Velora Heckler)  . History of  radiation therapy   . Small cell lung cancer   . Non-small cell lung cancer    Past Surgical History  Procedure Laterality Date  . Appendectomy    . Lumbar laminectomy    . Tonsillectomy    . Bilateral ganglionectomies    . Radical right neck dissection  2008  . Pci-rfa renal cell (tcc) cancer  2009    pt denies  . Cholecystectomy  dec. 2009  . Septoplasty  03/01/12    with double turbinectomy  . Video bronchoscopy Bilateral 08/10/2012    Procedure: VIDEO BRONCHOSCOPY WITH FLUORO;  Surgeon: Rigoberto Noel, MD;  Location: [place];  Service:  Cardiopulmonary;  Laterality: Bilateral;  . Shoulder arthroscopy Left 2013  . Thoracentesis Right 10/2012   Social History:  reports that he has never smoked. He has never used smokeless tobacco. He reports that he does not drink alcohol or use illicit drugs. Lives with wife, 7 falls this week   Allergies  Allergen Reactions  . Erythromycin Diarrhea  . Tetracycline Diarrhea    Family History  Problem Relation Age of Onset  . Heart failure Father   . Kidney failure Father   . Other Father     Renal failure/ CHF  . Heart failure Mother   . Other Mother     renal failure  . Colon cancer Neg Hx   . Prostate cancer Neg Hx   . Diabetes Neg Hx      Prior to Admission medications   Medication Sig Start Date End Date Taking? Authorizing Provider  acetaminophen (TYLENOL) 500 MG tablet Take 500 mg by mouth every 4 (four) hours as needed (Pain).    Yes Historical Provider, MD  antiseptic oral rinse (BIOTENE) LIQD 15 mLs by Mouth Rinse route as needed for dry mouth.   Yes Historical Provider, MD  fentaNYL (DURAGESIC - DOSED MCG/HR) 50 MCG/HR Place 1 patch (50 mcg total) onto the skin every 3 (three) days. 02/25/13  Yes Carlton Adam, PA-C  finasteride (PROSCAR) 5 MG tablet Take 5 mg by mouth at bedtime.    Yes Historical Provider, MD  furosemide (LASIX) 20 MG tablet Take 20 mg by mouth daily as needed for fluid.   Yes Historical Provider, MD  levothyroxine (SYNTHROID, LEVOTHROID) 75 MCG tablet Take 75 mcg by mouth daily before breakfast.    Yes Historical Provider, MD  LORazepam (ATIVAN) 1 MG tablet Take 1 mg by mouth every 8 (eight) hours.   Yes Historical Provider, MD  mometasone (NASONEX) 50 MCG/ACT nasal spray Place 2 sprays into the nose daily.   Yes Historical Provider, MD  ondansetron (ZOFRAN) 4 MG tablet Take 1 tablet (4 mg total) by mouth every 8 (eight) hours as needed for nausea or vomiting. 03/28/13  Yes Neena Rhymes, MD  oxyCODONE-acetaminophen (PERCOCET) 10-325 MG per tablet  Take 1 tablet by mouth every 4 (four) hours as needed for pain. 02/25/13  Yes Curt Bears, MD  PRESCRIPTION MEDICATION Carboplatin, Taxol Dr. Earlie Server   Yes Historical Provider, MD  prochlorperazine (COMPAZINE) 10 MG tablet Take 10 mg by mouth every 6 (six) hours as needed for nausea or vomiting.   Yes Historical Provider, MD  sertraline (ZOLOFT) 100 MG tablet Take 1 tablet (100 mg total) by mouth daily. 02/18/13  Yes Neena Rhymes, MD  terazosin (HYTRIN) 1 MG capsule Take 1 mg by mouth 2 (two) times daily.    Yes Historical Provider, MD   Physical Exam: Filed Vitals:   04/01/13  1300  BP: 143/42  Pulse: 70  Temp: 97.2 F (36.2 C)  TempSrc: Oral  Resp: 19  Height: 6' (1.829 m)  Weight: 73.6 kg (162 lb 4.1 oz)  SpO2: 99%     General:  Cachectic CM, NAD  Eyes:  PERRL, anicteric, non-injected.  ENT:  Nares clear.  OP clear, non-erythematous without plaques or exudates.  Dry MM.  Neck:  Supple without TM or JVD.    Lymph:  No cervical, supraclavicular, or submandibular LAD.  Cardiovascular:  RRR, normal S1, S2, without m/r/g.  2+ pulses, warm extremities  Respiratory: CTAB without increased WOB.  Abdomen:  NABS.  Soft, ND, TTP through right shoulder and right chest extending into abdomen, no rebound or guarding    Skin:  No rashes or focal lesions.  Musculoskeletal:  Normal bulk and tone.  No LE edema.  Psychiatric:  A & O x 4.  Frustrated  Neurologic:  CN 3-12 intact.  4/5 strength throughout.  Sensation intact.  Labs on Admission:  Basic Metabolic Panel:  Recent Labs Lab 03/31/13 1134 04/01/13 1530  NA 139  --   K 3.4*  --   CO2 27  --   GLUCOSE 108  --   BUN 20.7  --   CREATININE 0.9 0.89  CALCIUM 9.1  --    Liver Function Tests:  Recent Labs Lab 03/31/13 1134  AST 10  ALT 14  ALKPHOS 141  BILITOT 0.37  PROT 6.5  ALBUMIN 2.1*   No results found for this basename: LIPASE, AMYLASE,  in the last 168 hours No results found for this basename:  AMMONIA,  in the last 168 hours CBC:  Recent Labs Lab 03/31/13 1134 04/01/13 1530  WBC 4.6 3.9*  NEUTROABS 3.8  --   HGB 8.1* 7.3*  HCT 25.3* 22.7*  MCV 87.5 86.0  PLT 185 169   Cardiac Enzymes: No results found for this basename: CKTOTAL, CKMB, CKMBINDEX, TROPONINI,  in the last 168 hours  BNP (last 3 results)  Recent Labs  12/11/12 1220  PROBNP 430.0   CBG: No results found for this basename: GLUCAP,  in the last 168 hours  Radiological Exams on Admission: No results found.  EKG: pending  Assessment/Plan Active Problems:   XEROSTOMIA   Small cell lung cancer   Non-small cell cancer of right lung   Dehydration   Protein-calorie malnutrition, severe   Falls frequently   Hypokalemia   Normocytic anemia  ---  Dehydration suggested by elevated BUN:Cr  -  Start IVF  Severe protein-calorie malnutrition -  Nutrition consultation -  Patient does not tolerate oral supplements -  Suggested PEG tube, however, patient would like to think about this option -  Regular diet  Failure to thrive with recurrent falls likely due to chemotherapy, multiple malignancies, and difficulty eating  -  PT/OT consultations  SCLC and NSCLC and SCC of head and neck  -  Appreciate Dr. Worthy Flank assistance  Xerostomia -  Continue biotene -  Consider trial of pilocarpine which may also help his constipation  Chronic pain -  Continue fentanyl patch and consider dose increase -  Continue oxycodone  Constipation -  Start colace, senna with magnesium citrate and bisacodyl prn  BPH:   -  Continue finasteride and terasozin  Hypokalemia -  Add potassium to IVF  Normocytic anemia, likely secondary to chemotherapy, no obvious bleeding -  Occult stool -  Iron panel, b12, folate, and TSH with morning labs -  Tx for  hgb < 7  Depression, uncontrolled.  Continue zoloft.    Social:  Patient seems very frustrated and depressed.  I suggested PEG tube and continuation of chemotherapy  vs. Palliative care/hospice care.  Patient wants to meet with the palliative care doctor to discuss what palliative care has to offer so he can clarify his goals of care.  He understands that it would be ideal if we could either arrange for PEG placement so he can regain his strength and continue chemo or arrange for hospice care at the time of discharge because otherwise, he will likely be readmitted again soon with dehydration and electrolyte imbalance.    Diet:  regular Access:  port IVF:  yes Proph:  lovenox  Code Status: DNR  Family Communication: spoke with patient alone Disposition Plan: Admit to med-surg  Time spent: 60 min Kadience Macchi [facility] Pager [phone]  If 7PM-7AM, please contact night-coverage [email or web address] Password [facility] 04/01/2013, 6:54 PM

## 2013-04-01 NOTE — Progress Notes (Signed)
INITIAL NUTRITION ASSESSMENT  DOCUMENTATION CODES Per approved criteria  -Severe malnutrition in the context of chronic illness  Pt meets criteria for severe MALNUTRITION in the context of chronic illness as evidenced by 14% weight loss in one month, PO intake <75% for > one month   INTERVENTION: -Offered variety of nutrition supplements -Encouraged PO intake -Recommend SLP evaluation-pt will likely benefit from liberalized diet to encourage appetite -Consider use of PEG tube per family/pt request -Will continue to monitor  NUTRITION DIAGNOSIS: Inadequate oral intake related to decreased appetite/difficulty swallowing as evidenced by PO intake <75%, 17 lbs wt loss.   Goal: Pt to meet >/= 90% of their estimated nutrition needs    Monitor:  Diet order, total protein/energy intake, labs, weights, swallow profile  Reason for Assessment: Consult to Assess/MST  79 y.o. male  Admitting Dx: <principal problem not specified>  ASSESSMENT: -Pt reported prolonged period of suboptimal intake since neck resection in 2008 -Pt's wife reported pt consumes very small amounts of foods (1/4 cup) d/t difficulty swallowing. 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 Used after neck dissection procedure. Wife reported use of high calorie/protein tube feeding of 2-3 bolus cans/daily. Tube has not been used recently -Endorsed a 17 lbs wt loss in 2.5 weeks (03/17/2013) -Offered variety of snacks and supplement for pt. All of which were declined -Per discussion with RN, pt to have SLP evaluation. Family to discuss goals of care. Will monitor to determine if pt wants to restart TF -Chemo and radiation on  hold -Outpatient RD contacted family in 01/2013 d/t concern for declining weight. Family had declined outpatient RD appointment -Pt with constipation- Receiving dulocolax. Had some nausea/vomiting pta. MD noted usually resolves with Reglan  Height: Ht Readings from Last 1 Encounters:  04/01/13 6' (1.829 m)    Weight: Wt Readings from Last 1 Encounters:  04/01/13 162 lb 4.1 oz (73.6 kg)    Ideal Body Weight: 184 lbs  % Ideal Body Weight: 88%  Wt Readings from Last 10 Encounters:  04/01/13 162 lb 4.1 oz (73.6 kg)  03/31/13 161 lb (73.029 kg)  03/29/13 165 lb 12.8 oz (75.206 kg)  03/17/13 178 lb (80.74 kg)  03/04/13 179 lb 12.8 oz (81.557 kg)  02/21/13 189 lb 12.8 oz (86.093 kg)  01/28/13 192 lb 9.6 oz (87.363 kg)  12/29/12 194 lb 1.6 oz (88.043 kg)  11/17/12 200 lb 3.2 oz (90.81 kg)  11/10/12 200 lb (90.719 kg)    Usual Body Weight: 179 lbs  % Usual Body Weight: 91%  BMI:  Body mass index is 22 kg/(m^2).  Estimated Nutritional Needs: Kcal: 2200-2400  Protein: 110-120 gram Fluid: >/=2400 ml/daily  Skin: WDL  Diet Order: General  EDUCATION NEEDS: -No education needs identified at this time  No intake or output data in the 24 hours ending 04/01/13 1531  Last BM: pta  Labs:   Recent Labs Lab 03/31/13 1134  NA 139  K 3.4*  CO2 27  BUN 20.7  CREATININE 0.9  CALCIUM 9.1  GLUCOSE 108    CBG (last 3)  No results found for this basename: GLUCAP,  in the last 72 hours  Scheduled Meds: . docusate sodium  200 mg Oral BID  .  enoxaparin (LOVENOX) injection  40 mg Subcutaneous Daily  . [START ON 04/04/2013] fentaNYL  50 mcg Transdermal Q72H  . finasteride  5 mg Oral QHS  . fluticasone  1 spray Each Nare Daily  . [START ON 04/02/2013] levothyroxine  75 mcg Oral QAC breakfast  . senna  2 tablet Oral QHS  . sertraline  100 mg Oral Daily  . terazosin  1 mg Oral BID    Continuous Infusions: . dextrose 5 % and 0.45 % NaCl with KCl 20 mEq/L      Past Medical  History  Diagnosis Date  . Hyperlipidemia   . OSA (obstructive sleep apnea)   . BPH (benign prostatic hypertrophy)   . Kidney disease     TCC  . Xerostomia   . Pharynx cancer     squamous cell stage 4,s/p XRT,chemo, neck dissection  . Colon polyp     HYPERPLASTIC & TUBULAR ADENOMA(Colonoscopy-Dr.Dana)  . Diverticulosis of colon (without mention of hemorrhage)     (Colonoscopy-Dr.[place])  . Internal hemorrhoids without mention of complication     (Colonoscopy-Dr.Waverly Hall)  . GERD (gastroesophageal reflux disease)     (EGD-Dr. Velora Heckler)  . Atrophic gastritis without mention of hemorrhage     (EGD-Dr. Velora Heckler)  . History of radiation therapy     Past Surgical History  Procedure Laterality Date  . Appendectomy    . Lumbar laminectomy    . Tonsillectomy    . Bilateral ganglionectomies    . Radical right neck dissection  2008  . Pci-rfa renal cell (tcc) cancer  2009    pt denies  . Cholecystectomy  dec. 2009  . Septoplasty  03/01/12    with double turbinectomy  . Video bronchoscopy Bilateral 08/10/2012    Procedure: VIDEO BRONCHOSCOPY WITH FLUORO;  Surgeon: Rigoberto Noel, MD;  Location: [facility];  Service: Cardiopulmonary;  Laterality: Bilateral;  . Shoulder arthroscopy Left 2013  . Thoracentesis Right 10/2012    Atlee Abide MS RD LDN Clinical Dietitian [phone]

## 2013-04-01 NOTE — Telephone Encounter (Signed)
Put pt in for IVF today per tania

## 2013-04-02 DIAGNOSIS — R627 Adult failure to thrive: Secondary | ICD-10-CM | POA: Diagnosis present

## 2013-04-02 DIAGNOSIS — R5383 Other fatigue: Secondary | ICD-10-CM

## 2013-04-02 DIAGNOSIS — T451X5A Adverse effect of antineoplastic and immunosuppressive drugs, initial encounter: Secondary | ICD-10-CM

## 2013-04-02 DIAGNOSIS — R531 Weakness: Secondary | ICD-10-CM | POA: Diagnosis present

## 2013-04-02 DIAGNOSIS — D6481 Anemia due to antineoplastic chemotherapy: Secondary | ICD-10-CM

## 2013-04-02 DIAGNOSIS — E43 Unspecified severe protein-calorie malnutrition: Secondary | ICD-10-CM

## 2013-04-02 DIAGNOSIS — R5381 Other malaise: Secondary | ICD-10-CM

## 2013-04-02 LAB — CBC
HCT: 22.2 % — ABNORMAL LOW (ref 39.0–52.0)
Hemoglobin: 7.3 g/dL — ABNORMAL LOW (ref 13.0–17.0)
MCH: 28.7 pg (ref 26.0–34.0)
MCHC: 32.9 g/dL (ref 30.0–36.0)
MCV: 87.4 fL (ref 78.0–100.0)
Platelets: 154 10*3/uL (ref 150–400)
RBC: 2.54 MIL/uL — ABNORMAL LOW (ref 4.22–5.81)
RDW: 16.7 % — AB (ref 11.5–15.5)
WBC: 3.7 10*3/uL — AB (ref 4.0–10.5)

## 2013-04-02 LAB — BASIC METABOLIC PANEL
BUN: 13 mg/dL (ref 6–23)
CO2: 27 mEq/L (ref 19–32)
CREATININE: 0.85 mg/dL (ref 0.50–1.35)
Calcium: 8.5 mg/dL (ref 8.4–10.5)
Chloride: 98 mEq/L (ref 96–112)
GFR calc non Af Amer: 83 mL/min — ABNORMAL LOW (ref >90)
Glucose, Bld: 137 mg/dL — ABNORMAL HIGH (ref 70–99)
POTASSIUM: 3.3 meq/L — AB (ref 3.7–5.3)
Sodium: 136 mEq/L — ABNORMAL LOW (ref 137–147)

## 2013-04-02 LAB — IRON AND TIBC
Iron: 14 ug/dL — ABNORMAL LOW (ref 42–135)
SATURATION RATIOS: 12 % — AB (ref 20–55)
TIBC: 113 ug/dL — AB (ref 215–435)
UIBC: 99 ug/dL — ABNORMAL LOW (ref 125–400)

## 2013-04-02 LAB — TSH: TSH: 20.802 u[IU]/mL — ABNORMAL HIGH (ref 0.350–4.500)

## 2013-04-02 LAB — VITAMIN B12: Vitamin B-12: 1531 pg/mL — ABNORMAL HIGH (ref 211–911)

## 2013-04-02 LAB — FERRITIN: Ferritin: 2032 ng/mL — ABNORMAL HIGH (ref 22–322)

## 2013-04-02 LAB — MAGNESIUM: Magnesium: 1.5 mg/dL (ref 1.5–2.5)

## 2013-04-02 MED ORDER — POTASSIUM CHLORIDE 10 MEQ/50ML IV SOLN
10.0000 meq | INTRAVENOUS | Status: AC
  Administered 2013-04-02 (×4): 10 meq via INTRAVENOUS
  Filled 2013-04-02 (×4): qty 50

## 2013-04-02 NOTE — Progress Notes (Signed)
PROGRESS NOTE   Samuel Hurley VZD:[id number] DOB: 1935-01-08 DOA: 04/01/2013 PCP: [name], MD  Brief narrative: Samuel Hurley is an 79 y.o. 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 Julien Nordmann, who was admitted directly from the [facility] 04/01/13 with dehydration, failure to thrive, and poor oral intake. He is status post 2 cycles of Abraxane and carboplatin, last dose given 03/24/1541 of NSCLC.  Assessment/Plan: Principal Problem:   Dehydration with generalized weakness, adult failure to thrive, frequent falls, and severe protein calorie malnutrition in the setting of advanced malignancy Patient was started on IV fluids and a dietitian consultation was requested. Consider percutaneous enteral gastrostomy tube placement for supplemental feeding. Diet liberalized. PT/OT consultations requested to evaluate generalized weakness, frequent falls. A lengthy discussion was held with the patient and his wife and he clearly wants to finish his chemotherapy and improve his nutritional status with placement of a PEG tube for supplemental tube feeding. As such, we will request PEG tube placement. Active Problems:   XEROSTOMIA with dysphagia Continue biotene.  ST evaluation requested, PEG planned.   Small cell lung cancer / Non-small cell cancer of right lung Dr. Julien Nordmann notified of the patient's admission. Patient has completed 2/6 cycles of chemotherapy which he wishes to continue.   Hypokalemia Potassium added to IV fluids. Check magnesium as potassium still low. We'll give 4 potassium runs today.   Antineoplastic chemotherapy induced anemia Hemoglobin 7.3. Consider transfusion for hemoglobin less than 7. Followup iron studies, B12 and RBC folate.   Constipation Continue Colace, senna with magnesium citrate and bisacodyl when necessary.   Chronic  pain Continue fentanyl patch and oxycodone as needed.   BPH Continue finasteride and terasozin.   Depression, uncontrolled Continue Zoloft.   Hypothyroidism Continue Synthroid. Followup TSH.   DVT Prophylaxis Continue Lovenox.  Code Status: DNR Family Communication: Wife updated at bedside. Disposition Plan: Home when stable.   IV access:  Port-A-Cath  Medical Consultants:  Palliative care  Other Consultants:  Physical therapy  Occupational therapy  Dietitian  Speech therapy  Anti-infectives:  None  HPI/Subjective: Samuel Hurley has ongoing dysphagia even with liquids.  He has chronic dry mouth.  No current complaints of new pain.  No nausea or vomiting.   Objective: Filed Vitals:   04/01/13 1300 04/01/13 2036 04/02/13 0614  BP: 143/42 125/45 125/86  Pulse: 70 75 77  Temp: 97.2 F (36.2 C) 98.4 F (36.9 C) 98.1 F (36.7 C)  TempSrc: Oral Oral Oral  Resp: 19 18 18   Height: 6' (1.829 m)  6' (1.829 m)  Weight: 73.6 kg (162 lb 4.1 oz)  75.5 kg (166 lb 7.2 oz)  SpO2: 99% 97% 98%   No intake or output data in the 24 hours ending 04/02/13 0804  Exam: Gen:  NAD, cachectic Cardiovascular:  RRR, No M/R/G Respiratory:  Lungs CTAB Gastrointestinal:  Abdomen soft, NT/ND, + BS Extremities:  No C/E/C  Data Reviewed: Basic Metabolic Panel:  Recent Labs Lab 03/31/13 1134 04/01/13 1530 04/02/13 0448  NA 139  --  136*  K 3.4*  --  3.3*  CL  --   --  98  CO2 27  --  27  GLUCOSE 108  --  137*  BUN 20.7  --  13  CREATININE 0.9 0.89 0.85  CALCIUM 9.1  --  8.5   GFR  Estimated Creatinine Clearance: 79 ml/min (by C-G formula based on Cr of 0.85). Liver Function Tests:  Recent Labs Lab 03/31/13 1134  AST 10  ALT 14  ALKPHOS 141  BILITOT 0.37  PROT 6.5  ALBUMIN 2.1*   CBC:  Recent Labs Lab 03/31/13 1134 04/01/13 1530 04/02/13 0448  WBC 4.6 3.9* 3.7*  NEUTROABS 3.8  --   --   HGB 8.1* 7.3* 7.3*  HCT 25.3* 22.7* 22.2*  MCV 87.5 86.0  87.4  PLT 185 169 154   BNP (last 3 results)  Recent Labs  12/11/12 1220  PROBNP 430.0   Thyroid function studies No results found for this basename: TSH, T4TOTAL, FREET3, T3FREE, THYROIDAB,  in the last 72 hours Anemia work up No results found for this basename: VITAMINB12, FOLATE, FERRITIN, TIBC, IRON, RETICCTPCT,  in the last 72 hours Microbiology No results found for this or any previous visit (from the past 240 hour(s)).   Procedures and Diagnostic Studies: No results found.  Scheduled Meds: . docusate sodium  200 mg Oral BID  . enoxaparin (LOVENOX) injection  40 mg Subcutaneous Daily  . fentaNYL  50 mcg Transdermal Q72H  . finasteride  5 mg Oral QHS  . fluticasone  1 spray Each Nare Daily  . levothyroxine  75 mcg Oral QAC breakfast  . senna  2 tablet Oral QHS  . sertraline  100 mg Oral Daily  . terazosin  1 mg Oral BID   Continuous Infusions: . dextrose 5 % and 0.45 % NaCl with KCl 20 mEq/L 75 mL/hr at 04/01/13 1546    Time spent: 35 minutes with > 50% of time discussing current diagnostic test results, clinical impression and plan of care.    LOS: 1 day   Dawnita Molner  [facility] Pager [phone]. If unable to reach me by pager, please call my cell phone at [phone].  *Please note that the hospitalists switch teams on Wednesdays. Please call the flow manager at [phone] if you are having difficulty reaching the hospitalist taking care of this patient as she can update you and provide the most up-to-date pager number of provider caring for the patient. If 8PM-8AM, please contact night-coverage at [email or web address], password [facility]  04/02/2013, 8:04 AM    **Disclaimer: This note was dictated with voice recognition software. Similar sounding words can inadvertently be transcribed and this note may contain transcription errors which may not have been corrected upon publication of note.**

## 2013-04-02 NOTE — Progress Notes (Signed)
UR completed 

## 2013-04-02 NOTE — Evaluation (Signed)
Physical Therapy Evaluation Patient Details Name: Samuel Hurley MRN: [id number] DOB: 1934-05-30 Today's Date: 04/02/2013 Time: 3235-5732 PT Time Calculation (min): 13 min  PT Assessment / Plan / Recommendation History of Present Illness  Samuel Hurley is an 79 y.o. 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 Julien Nordmann, who was admitted directly from the [facility] [place] [facility] 04/01/13 with dehydration, failure to thrive, and poor oral intake. He is status post 2 cycles of Abraxane and carboplatin, last dose given 03/24/1541 of NSCLC.  Clinical Impression  Pt will benefit from PT to address deficits below; May need cane for home to incr stability/will assess; will continue to work on higher level balance and activity tolerance; pt very fatigued after amb    PT Assessment  Patient needs continued PT services    Follow Up Recommendations  Home health PT    Does the patient have the potential to tolerate intense rehabilitation      Barriers to Discharge        Equipment Recommendations  None recommended by PT (may need SPC??)    Recommendations for Other Services     Frequency Min 3X/week    Precautions / Restrictions Precautions Precautions: Fall Precaution Comments: pt has had 7 falls recently; each time pt lost his balance to the right per pt wife   Pertinent Vitals/Pain VSS Denies pain, only fatigue     Mobility  Bed Mobility Overal bed mobility: Needs Assistance Bed Mobility: Supine to Sit;Sit to Supine Supine to sit: Min guard Sit to supine: Min guard General bed mobility comments: for safety, incr time Transfers Overall transfer level: Needs assistance Equipment used: None Transfers: Sit to/from Stand Sit to Stand: Min guard General transfer comment: for safety Ambulation/Gait Ambulation/Gait assistance: Min guard Ambulation Distance (Feet):  250 Feet Assistive device: 1 person hand held assist;None (IV pole) Gait Pattern/deviations: Step-through pattern;Decreased stride length;Drifts right/left General Gait Details: pt with LOB to right x 3 with min/guard to recover;      Exercises     PT Diagnosis: Difficulty walking  PT Problem List: Decreased activity tolerance;Decreased balance;Decreased mobility PT Treatment Interventions: Gait training;DME instruction;Stair training;Functional mobility training;Therapeutic activities;Therapeutic exercise;Balance training;Patient/family education     PT Goals(Current goals can be found in the care plan section) Acute Rehab PT Goals Patient Stated Goal: get outside; pt reports he is tired of being inside, in the same room; PT Goal Formulation: With patient Time For Goal Achievement: 04/09/13 Potential to Achieve Goals: Good  Visit Information  Last PT Received On: 04/02/13 Assistance Needed: +1 History of Present Illness: Samuel Hurley is an 79 y.o. 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 Julien Nordmann, who was admitted directly from the [facility] 04/01/13 with dehydration, failure to thrive, and poor oral intake. He is status post 2 cycles of Abraxane and carboplatin, last dose given 03/24/1541 of NSCLC.       Prior [place] expects to be discharged to:: Private residence Living Arrangements: Spouse/significant other Type of Home: House Home Access: Stairs to enter [employer] of Steps: 1 Home Layout: Two level;Able to live on main level with bedroom/bathroom Home Equipment: Gilford Rile - 2 wheels;Cane - single point Prior Function Level of Independence: Independent Communication Communication: No difficulties  Cognition  Cognition Arousal/Alertness: Awake/alert Behavior During Therapy: Flat affect;WFL for  tasks assessed/performed Overall Cognitive Status: Impaired/Different from baseline Area of Impairment: Problem solving;Awareness Problem Solving: Slow processing General Comments: pt   says his thinking has been thrown for a loop    Extremity/Trunk Assessment Upper Extremity Assessment Upper Extremity Assessment: Generalized weakness Lower Extremity Assessment Lower Extremity Assessment: Generalized weakness   Balance Balance Overall balance assessment: Needs assistance Sitting-balance support: No upper extremity supported;Feet supported Sitting balance-Leahy Scale: Good Standing balance support: No upper extremity supported;During functional activity Standing balance-Leahy Scale: Fair Standing balance comment: pt reacts to min perturbations/LOB but reactions are slowed High level balance activites: Backward walking;Direction changes;Turns;Sudden stops;Head turns High Level Balance Comments: LOB to right x 3 with min'guard to recover  End of Session PT - End of Session Equipment Utilized During Treatment: Gait belt Activity Tolerance: Patient tolerated treatment well Patient left: in bed;with call bell/phone within reach;with family/visitor present Nurse Communication: Mobility status  GP     [facility] 04/02/2013, 2:31 PM

## 2013-04-02 NOTE — Evaluation (Signed)
Occupational Therapy Evaluation Patient Details Name: Samuel Hurley MRN: [id number] DOB: February 18, 1934 Today's Date: 04/02/2013 Time: 1607-3710 OT Time Calculation (min): 64 min  OT Assessment / Plan / Recommendation History of present illness Samuel Hurley is an 79 y.o. 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 Julien Nordmann, who was admitted directly from the [facility] 04/01/13 with dehydration, failure to thrive, and poor oral intake. He is status post 2 cycles of Abraxane and carboplatin, last dose given 03/24/1541 of NSCLC.   Clinical Impression   Pt presents to OT with decreased I with ADL activity s.p admission to [facility] due to problems listed below.  Pt will benefit from skilled OT to increase I with ADL activity .    OT Assessment  Patient needs continued OT Services          Equipment Recommendations  None recommended by OT       Frequency  Min 2X/week    Precautions / Restrictions Precautions Precautions: Fall       ADL  Grooming: Min guard Where Assessed - Grooming: Unsupported standing Upper Body Bathing: Set up Where Assessed - Upper Body Bathing: Unsupported sitting Lower Body Bathing: Minimal assistance Where Assessed - Lower Body Bathing: Supported sit to stand Upper Body Dressing: Set up Where Assessed - Upper Body Dressing: Unsupported sitting Lower Body Dressing: Minimal assistance Where Assessed - Lower Body Dressing: Supported sit to stand Toilet Transfer: Minimal assistance Toilet Transfer Method: Sit to stand Toileting - Water quality scientist and Hygiene: Min guard ADL Comments: Pt very upset about hospital stay and mentioned he just wished hecould get some fresh air.  OT spoke with RN who called the MD and obtained order that pt could go off the floor.  OT took pt with IV to front entrance.  Pt calmed immensely and was  appreciative.  Pt went to bed willingly .    OT Diagnosis: Generalized weakness;Acute pain;Cognitive deficits  OT Problem List: Decreased strength;Decreased activity tolerance;Decreased cognition;Decreased safety awareness;Impaired balance (sitting and/or standing) OT Treatment Interventions: Self-care/ADL training;DME and/or AE instruction;Patient/family education   OT Goals(Current goals can be found in the care plan section) Acute Rehab OT Goals Patient Stated Goal: get outside  OT Goal Formulation: With patient Time For Goal Achievement: 04/16/13 Potential to Achieve Goals: Good  Visit Information  Last OT Received On: 04/02/13 History of Present Illness: Samuel Hurley is an 79 y.o. 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 Julien Nordmann, who was admitted directly from the [place] 04/01/13 with dehydration, failure to thrive, and poor oral intake. He is status post 2 cycles of Abraxane and carboplatin, last dose given 03/24/1541 of NSCLC.       Prior [place] expects to be discharged to:: Private residence Living Arrangements: Spouse/significant other Type of Home: House Home Access: Stairs to enter [employer] of Steps: 1 Home Layout: Two level;Able to live on main level with bedroom/bathroom Home Equipment: Gilford Rile - 2 wheels;Cane - single point Prior Function Level of Independence: Independent Communication Communication: No difficulties            Cognition  Cognition Arousal/Alertness: Awake/alert Behavior During Therapy: Flat affect;WFL for tasks assessed/performed Overall Cognitive Status: Impaired/Different from baseline Area  of Impairment: Problem solving;Awareness Problem Solving: Slow processing General Comments: pt   says his thinking has been thrown for a loop    Extremity/Trunk  Assessment Upper Extremity Assessment Upper Extremity Assessment: Generalized weakness     Mobility Bed Mobility Overal bed mobility: Needs Assistance Bed Mobility: Supine to Sit Supine to sit: Min guard Transfers Overall transfer level: Needs assistance Equipment used: 1 person hand held assist Transfers: Sit to/from Stand Sit to Stand: Min assist           End of Session OT - End of Session Activity Tolerance: Patient limited by fatigue Patient left: in bed;with family/visitor present;with call bell/phone within reach Nurse Communication: Mobility status  GO     Betsy Pries 04/02/2013, 2:02 PM

## 2013-04-03 DIAGNOSIS — F410 Panic disorder [episodic paroxysmal anxiety] without agoraphobia: Secondary | ICD-10-CM

## 2013-04-03 DIAGNOSIS — E876 Hypokalemia: Secondary | ICD-10-CM

## 2013-04-03 DIAGNOSIS — C3490 Malignant neoplasm of unspecified part of unspecified bronchus or lung: Secondary | ICD-10-CM

## 2013-04-03 DIAGNOSIS — R410 Disorientation, unspecified: Secondary | ICD-10-CM | POA: Diagnosis present

## 2013-04-03 LAB — BASIC METABOLIC PANEL
BUN: 10 mg/dL (ref 6–23)
CO2: 27 meq/L (ref 19–32)
Calcium: 8.9 mg/dL (ref 8.4–10.5)
Chloride: 94 mEq/L — ABNORMAL LOW (ref 96–112)
Creatinine, Ser: 0.79 mg/dL (ref 0.50–1.35)
GFR calc Af Amer: >90 mL/min (ref >90)
GFR calc non Af Amer: 85 mL/min — ABNORMAL LOW (ref >90)
GLUCOSE: 136 mg/dL — AB (ref 70–99)
Potassium: 4.3 mEq/L (ref 3.7–5.3)
Sodium: 133 mEq/L — ABNORMAL LOW (ref 137–147)

## 2013-04-03 LAB — CBC
HEMATOCRIT: 24.4 % — AB (ref 39.0–52.0)
Hemoglobin: 7.5 g/dL — ABNORMAL LOW (ref 13.0–17.0)
MCH: 27.2 pg (ref 26.0–34.0)
MCHC: 30.7 g/dL (ref 30.0–36.0)
MCV: 88.4 fL (ref 78.0–100.0)
Platelets: 160 10*3/uL (ref 150–400)
RBC: 2.76 MIL/uL — ABNORMAL LOW (ref 4.22–5.81)
RDW: 16.8 % — ABNORMAL HIGH (ref 11.5–15.5)
WBC: 3.1 10*3/uL — ABNORMAL LOW (ref 4.0–10.5)

## 2013-04-03 LAB — FOLATE RBC: RBC FOLATE: 537 ng/mL (ref >280)

## 2013-04-03 MED ORDER — LORAZEPAM 1 MG PO TABS
1.0000 mg | ORAL_TABLET | Freq: Three times a day (TID) | ORAL | Status: DC
Start: 2013-04-03 — End: 2013-04-05
  Administered 2013-04-03 (×2): 1 mg via ORAL
  Filled 2013-04-03 (×2): qty 1

## 2013-04-03 MED ORDER — LORAZEPAM 2 MG/ML IJ SOLN
1.0000 mg | Freq: Four times a day (QID) | INTRAMUSCULAR | Status: DC | PRN
  Administered 2013-04-04 – 2013-04-05 (×5): 1 mg via INTRAVENOUS
  Filled 2013-04-03 (×5): qty 1

## 2013-04-03 MED ORDER — MAGNESIUM SULFATE 40 MG/ML IJ SOLN
2.0000 g | Freq: Once | INTRAMUSCULAR | Status: AC
  Administered 2013-04-03: 2 g via INTRAVENOUS
  Filled 2013-04-03: qty 50

## 2013-04-03 MED ORDER — LEVOTHYROXINE SODIUM 100 MCG PO TABS
100.0000 ug | ORAL_TABLET | Freq: Every day | ORAL | Status: DC
  Filled 2013-04-03 (×3): qty 1

## 2013-04-03 MED ORDER — LORAZEPAM 2 MG/ML IJ SOLN
1.0000 mg | Freq: Once | INTRAMUSCULAR | Status: AC
  Administered 2013-04-03: 1 mg via INTRAVENOUS
  Filled 2013-04-03: qty 1

## 2013-04-03 MED ORDER — CEFAZOLIN SODIUM-DEXTROSE 2-3 GM-% IV SOLR
2.0000 g | INTRAVENOUS | Status: AC
  Administered 2013-04-04: 2 g via INTRAVENOUS
  Filled 2013-04-03: qty 50

## 2013-04-03 NOTE — H&P (Signed)
Samuel Hurley is an 79 y.o. male.   Chief Complaint: Pt with new diagnosis of small cell lung Ca 06/2012 and [place] lung ca 09/2012. Previous dx pharynx Ca 2007 Pt has been able to continue to eat small amounts of mushy food and still able to drink slowly. Request has been made for percutaneous gastric tube since wt loss with chemotherapy. Failure to thrive; dysphagia; malnutrition- wt loss He has had G tube before with dx head neck ca in 2007  HPI: HLD; BPH; pharynx ca; lung ca; dysphagia; FTT Anemia of chronic disease---Dr Rama aware of H/H---will check in am; will transfuse if needed.  Past Medical History  Diagnosis Date  . Hyperlipidemia   . OSA (obstructive sleep apnea)   . BPH (benign prostatic hypertrophy)   . Kidney disease     TCC  . Xerostomia   . Pharynx cancer     squamous cell stage 4,s/p XRT,chemo, neck dissection  . Colon polyp     HYPERPLASTIC & TUBULAR ADENOMA(Colonoscopy-Dr.[place])  . Diverticulosis of colon (without mention of hemorrhage)     (Colonoscopy-Dr.[place])  . Internal hemorrhoids without mention of complication     (Colonoscopy-Dr.[place])  . GERD (gastroesophageal reflux disease)     (EGD-Dr. Velora Heckler)  . Atrophic gastritis without mention of hemorrhage     (EGD-Dr. Velora Heckler)  . History of radiation therapy   . Small cell lung cancer   . Non-small cell lung cancer     Past Surgical History  Procedure Laterality Date  . Appendectomy    . Lumbar laminectomy    . Tonsillectomy    . Bilateral ganglionectomies    . Radical right neck dissection  2008  . Pci-rfa renal cell (tcc) cancer  2009    pt denies  . Cholecystectomy  dec. 2009  . Septoplasty  03/01/12    with double turbinectomy  . Video bronchoscopy Bilateral 08/10/2012    Procedure: VIDEO BRONCHOSCOPY WITH FLUORO;  Surgeon: Rigoberto Noel, MD;  Location: [facility];  Service: Cardiopulmonary;  Laterality: Bilateral;  . Shoulder arthroscopy Left 2013  . Thoracentesis Right 10/2012     Family History  Problem Relation Age of Onset  . Heart failure Father   . Kidney failure Father   . Other Father     Renal failure/ CHF  . Heart failure Mother   . Other Mother     renal failure  . Colon cancer Neg Hx   . Prostate cancer Neg Hx   . Diabetes Neg Hx    Social History:  reports that he has never smoked. He has never used smokeless tobacco. He reports that he does not drink alcohol or use illicit drugs.  Allergies:  Allergies  Allergen Reactions  . Erythromycin Diarrhea  . Tetracycline Diarrhea    Medications Prior to Admission  Medication Dose Route Frequency Provider Last Rate Last Dose  . TDaP (BOOSTRIX) injection 0.5 mL  0.5 mL Intramuscular Once Neena Rhymes, MD       Medications Prior to Admission  Medication Sig Dispense Refill  . acetaminophen (TYLENOL) 500 MG tablet Take 500 mg by mouth every 4 (four) hours as needed (Pain).       Marland Kitchen antiseptic oral rinse (BIOTENE) LIQD 15 mLs by Mouth Rinse route as needed for dry mouth.      . fentaNYL (DURAGESIC - DOSED MCG/HR) 50 MCG/HR Place 1 patch (50 mcg total) onto the skin every 3 (three) days.  10 patch  0  . finasteride (  PROSCAR) 5 MG tablet Take 5 mg by mouth at bedtime.       . furosemide (LASIX) 20 MG tablet Take 20 mg by mouth daily as needed for fluid.      Marland Kitchen levothyroxine (SYNTHROID, LEVOTHROID) 75 MCG tablet Take 75 mcg by mouth daily before breakfast.       . LORazepam (ATIVAN) 1 MG tablet Take 1 mg by mouth every 8 (eight) hours.      . mometasone (NASONEX) 50 MCG/ACT nasal spray Place 2 sprays into the nose daily.      . ondansetron (ZOFRAN) 4 MG tablet Take 1 tablet (4 mg total) by mouth every 8 (eight) hours as needed for nausea or vomiting.  90 tablet  2  . oxyCODONE-acetaminophen (PERCOCET) 10-325 MG per tablet Take 1 tablet by mouth every 4 (four) hours as needed for pain.  60 tablet  0  . PRESCRIPTION MEDICATION Carboplatin, Taxol Dr. Earlie Server      . prochlorperazine (COMPAZINE) 10 MG  tablet Take 10 mg by mouth every 6 (six) hours as needed for nausea or vomiting.      . sertraline (ZOLOFT) 100 MG tablet Take 1 tablet (100 mg total) by mouth daily.  30 tablet  prn  . terazosin (HYTRIN) 1 MG capsule Take 1 mg by mouth 2 (two) times daily.         Results for orders placed during the hospital encounter of 04/01/13 (from the past 48 hour(s))  CBC     Status: Abnormal   Collection Time    04/01/13  3:30 PM      Result Value Ref Range   WBC 3.9 (*) 4.0 - 10.5 K/uL   RBC 2.64 (*) 4.22 - 5.81 MIL/uL   Hemoglobin 7.3 (*) 13.0 - 17.0 g/dL   HCT 22.7 (*) 39.0 - 52.0 %   MCV 86.0  78.0 - 100.0 fL   MCH 27.7  26.0 - 34.0 pg   MCHC 32.2  30.0 - 36.0 g/dL   RDW 16.8 (*) 11.5 - 15.5 %   Platelets 169  150 - 400 K/uL  CREATININE, SERUM     Status: Abnormal   Collection Time    04/01/13  3:30 PM      Result Value Ref Range   Creatinine, Ser 0.89  0.50 - 1.35 mg/dL   GFR calc non Af Amer 81 (*) >90 mL/min   GFR calc Af Amer >90  >90 mL/min   Comment: (NOTE)     The eGFR has been calculated using the CKD EPI equation.     This calculation has not been validated in all clinical situations.     eGFR's persistently <90 mL/min signify possible Chronic Kidney     Disease.  BASIC METABOLIC PANEL     Status: Abnormal   Collection Time    04/02/13  4:48 AM      Result Value Ref Range   Sodium 136 (*) 137 - 147 mEq/L   Potassium 3.3 (*) 3.7 - 5.3 mEq/L   Chloride 98  96 - 112 mEq/L   CO2 27  19 - 32 mEq/L   Glucose, Bld 137 (*) 70 - 99 mg/dL   BUN 13  6 - 23 mg/dL   Creatinine, Ser 0.85  0.50 - 1.35 mg/dL   Calcium 8.5  8.4 - 10.5 mg/dL   GFR calc non Af Amer 83 (*) >90 mL/min   GFR calc Af Amer >90  >90 mL/min   Comment: (NOTE)  The eGFR has been calculated using the CKD EPI equation.     This calculation has not been validated in all clinical situations.     eGFR's persistently <90 mL/min signify possible Chronic Kidney     Disease.  CBC     Status: Abnormal    Collection Time    04/02/13  4:48 AM      Result Value Ref Range   WBC 3.7 (*) 4.0 - 10.5 K/uL   RBC 2.54 (*) 4.22 - 5.81 MIL/uL   Hemoglobin 7.3 (*) 13.0 - 17.0 g/dL   HCT 22.2 (*) 39.0 - 52.0 %   MCV 87.4  78.0 - 100.0 fL   MCH 28.7  26.0 - 34.0 pg   MCHC 32.9  30.0 - 36.0 g/dL   RDW 16.7 (*) 11.5 - 15.5 %   Platelets 154  150 - 400 K/uL  IRON AND TIBC     Status: Abnormal   Collection Time    04/02/13  4:48 AM      Result Value Ref Range   Iron 14 (*) 42 - 135 ug/dL   TIBC 113 (*) 215 - 435 ug/dL   Saturation Ratios 12 (*) 20 - 55 %   UIBC 99 (*) 125 - 400 ug/dL   Comment: Performed at [facility]     Status: Abnormal   Collection Time    04/02/13  4:48 AM      Result Value Ref Range   Ferritin 2032 (*) 22 - 322 ng/mL   Comment: Result confirmed by automatic dilution.     Performed at [facility]     Status: Abnormal   Collection Time    04/02/13  4:48 AM      Result Value Ref Range   Vitamin B-12 1531 (*) 211 - 911 pg/mL   Comment: Performed at [facility]  TSH     Status: Abnormal   Collection Time    04/02/13  4:48 AM      Result Value Ref Range   TSH 20.802 (*) 0.350 - 4.500 uIU/mL   Comment: Performed at [facility]     Status: None   Collection Time    04/02/13  4:48 AM      Result Value Ref Range   Magnesium 1.5  1.5 - 2.5 mg/dL   No results found.  Review of Systems  Constitutional: Positive for weight loss. Negative for fever.  Respiratory: Negative for shortness of breath.   Gastrointestinal: Negative for nausea, vomiting and abdominal pain.  Neurological: Positive for weakness.    Blood pressure 117/49, pulse 71, temperature 97.9 F (36.6 C), temperature source Oral, resp. rate 16, height 6' (1.829 m), weight 77.4 kg (170 lb 10.2 oz), SpO2 99.00%. Physical Exam  Constitutional: He is oriented to person, place, and time.  thin  Cardiovascular: Normal rate and regular rhythm.    No murmur heard. Respiratory: Effort normal and breath sounds normal. He has no wheezes.  GI: Soft. Bowel sounds are normal. There is no tenderness.  scar from previous G tube from yrs ago  Musculoskeletal: Normal range of motion.  Neurological: He is alert and oriented to person, place, and time.  Skin: Skin is warm and dry.  Psychiatric: He has a normal mood and affect. His behavior is normal. Judgment and thought content normal.     Assessment/Plan FTT; wt loss Malnutrition Dysphagia Hx head neck ca; more recent dx lung  ca Scheduled for G tube in IR 3/16 Pt and family aware of procedure benefits and risks and agreeable to proceed (has had a G tube in past) Consent signed andin chart Ancef ordered Barium ordered Discussed with Dr Laurence Ferrari Check kub   Juwon Scripter A 04/03/2013, 11:55 AM

## 2013-04-03 NOTE — Progress Notes (Signed)
PROGRESS NOTE   Samuel Hurley JJO:[id number] DOB: 10-27-1934 DOA: 04/01/2013 PCP: [name], MD  Brief narrative: Samuel Hurley is an 79 y.o. 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 Julien Nordmann, who was admitted directly from the [facility] 04/01/13 with dehydration, failure to thrive, and poor oral intake. He is status post 2 cycles of Abraxane and carboplatin, last dose given 03/24/1541 of NSCLC.  Assessment/Plan: Principal Problem:   Dehydration with generalized weakness, adult failure to thrive, frequent falls, and severe protein calorie malnutrition in the setting of advanced malignancy Patient was started on IV fluids and a dietitian consultation was requested. Consider percutaneous enteral gastrostomy tube placement for supplemental feeding. Diet liberalized. PT/OT consultations requested to evaluate generalized weakness, frequent falls. A lengthy discussion was held with the patient and his wife and he clearly wants to finish his chemotherapy and improve his nutritional status with placement of a PEG tube for supplemental tube feeding. As such, we will request PEG tube placement. Active Problems:  Panic attack The patient developed agitation and belligerence, threatening to leave, on the morning of 04/03/13. Responded to Ativan.  Wife tells me that "he gets that way" if he doesn't get his Ativan on time, and that he has suffered with panic attacks since his lung cancer diagnosis.  Will change Ativan to Q 8 hours as a standing order, and add Ativan 1 mg IV Q 6 hours PRN.   XEROSTOMIA with dysphagia Continue biotene.  ST evaluation requested, PEG planned.   Small cell lung cancer / Non-small cell cancer of right lung Dr. Julien Nordmann notified of the patient's admission. Patient has completed 2/6 cycles of chemotherapy which he wishes to continue.  Hypokalemia Repleting.   Antineoplastic chemotherapy induced anemia Hemoglobin 7.3. Consider transfusion for hemoglobin less than 7. Followup RBC folate. No evidence of B12 deficiency. Iron studies consistent with anemia of chronic disease.   Constipation Continue Colace, senna with magnesium citrate and bisacodyl when necessary.   Chronic pain Continue fentanyl patch and oxycodone as needed.   BPH Continue finasteride and terasozin.   Depression, uncontrolled Continue Zoloft.   Hypothyroidism Increase Synthroid to 100 mcg daily. TSH markedly elevated.   DVT Prophylaxis Continue Lovenox.  Code Status: DNR Family Communication: Wife updated at bedside. Disposition Plan: Home when stable.   IV access:  Port-A-Cath  Medical Consultants:  Palliative care  Other Consultants:  Physical therapy: Home health PT  Occupational therapy  Dietitian  Speech therapy  Anti-infectives:  None  HPI/Subjective: Samuel Hurley was belligerent and agitated this morning. He was insistent on leaving the hospital. The patient's wife was able to calm him. He responded to 1 mg of IV Ativan and is now resting quietly.   Objective: Filed Vitals:   04/02/13 0614 04/02/13 1310 04/02/13 2102 04/03/13 0557  BP: 125/86 126/63 121/51 117/49  Pulse: 77 78 80 71  Temp: 98.1 F (36.7 C) 97.9 F (36.6 C) 98.5 F (36.9 C) 97.9 F (36.6 C)  TempSrc: Oral Oral Oral Oral  Resp: 18 18 18 16   Height: 6' (1.829 m)     Weight: 75.5 kg (166 lb 7.2 oz)   77.4 kg (170 lb 10.2 oz)  SpO2: 98% 98% 98% 99%    Intake/Output Summary (Last 24 hours) at 04/03/13 0849 Last data filed at 04/02/13 2200  Gross per 24 hour  Intake 1776.25 ml  Output      0 ml  Net 1776.25 ml    Exam: Gen:  Resting quietly Cardiovascular:  RRR, No M/R/G Respiratory:  Lungs CTAB Gastrointestinal:  Abdomen soft, NT/ND, + BS Extremities:  No C/E/C  Data Reviewed: Basic Metabolic Panel:  Recent Labs Lab  03/31/13 1134 04/01/13 1530 04/02/13 0448  NA 139  --  136*  K 3.4*  --  3.3*  CL  --   --  98  CO2 27  --  27  GLUCOSE 108  --  137*  BUN 20.7  --  13  CREATININE 0.9 0.89 0.85  CALCIUM 9.1  --  8.5  MG  --   --  1.5   GFR Estimated Creatinine Clearance: 80.9 ml/min (by C-G formula based on Cr of 0.85). Liver Function Tests:  Recent Labs Lab 03/31/13 1134  AST 10  ALT 14  ALKPHOS 141  BILITOT 0.37  PROT 6.5  ALBUMIN 2.1*   CBC:  Recent Labs Lab 03/31/13 1134 04/01/13 1530 04/02/13 0448  WBC 4.6 3.9* 3.7*  NEUTROABS 3.8  --   --   HGB 8.1* 7.3* 7.3*  HCT 25.3* 22.7* 22.2*  MCV 87.5 86.0 87.4  PLT 185 169 154   BNP (last 3 results)  Recent Labs  12/11/12 1220  PROBNP 430.0   Thyroid function studies  Recent Labs  04/02/13 0448  TSH 20.802*   Anemia work up  Recent Labs  04/02/13 0448  VITAMINB12 1531*  FERRITIN 2032*  TIBC 113*  IRON 14*   Microbiology No results found for this or any previous visit (from the past 240 hour(s)).   Procedures and Diagnostic Studies: No results found.  Scheduled Meds: . docusate sodium  200 mg Oral BID  . enoxaparin (LOVENOX) injection  40 mg Subcutaneous Daily  . fentaNYL  50 mcg Transdermal Q72H  . finasteride  5 mg Oral QHS  . fluticasone  1 spray Each Nare Daily  . levothyroxine  75 mcg Oral QAC breakfast  . LORazepam  1 mg Oral 3 times per day  . senna  2 tablet Oral QHS  . sertraline  100 mg Oral Daily  . terazosin  1 mg Oral BID   Continuous Infusions: . dextrose 5 % and 0.45 % NaCl with KCl 20 mEq/L 75 mL/hr at 04/02/13 1959    Time spent: 25 minutes.    LOS: 2 days   RAMA,CHRISTINA  [facility] Pager [phone]. If unable to reach me by pager, please call my cell phone at [phone].  *Please note that the hospitalists switch teams on Wednesdays. Please call the flow manager at [phone] if you are having difficulty reaching the hospitalist taking care of this patient as she can  update you and provide the most up-to-date pager number of provider caring for the patient. If 8PM-8AM, please contact night-coverage at [email or web address], password [facility]  04/03/2013, 8:49 AM    **Disclaimer: This note was dictated with voice recognition software. Similar sounding words can inadvertently be transcribed and this note may contain transcription errors which may not have been corrected upon publication of note.**

## 2013-04-03 NOTE — Progress Notes (Signed)
Occupational Therapy Treatment Patient Details Name: Duayne Brideau MRN: [id number] DOB: [birth year] Today's Date: 04/03/2013 Time: 9675-9163 OT Time Calculation (min): 25 min  OT Assessment / Plan / Recommendation  History of present illness Lucius Wise is an 79 y.o. 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 Julien Nordmann, who was admitted directly from the [facility] 04/01/13 with dehydration, failure to thrive, and poor oral intake. He is status post 2 cycles of Abraxane and carboplatin, last dose given 03/24/1541 of NSCLC.   OT comments  Pt appreciative of OT taking pt for some fresh air.  Pt with slower processing this OT visit, but was following commands appropriately.                    Plan Discharge plan remains appropriate    Precautions / Restrictions Precautions Precaution Comments: pt has had 7 falls recently; each time pt lost his balance to the right per pt wife       ADL  Lower Body Dressing: Minimal assistance Where Assessed - Lower Body Dressing: Supported sit to stand Toilet Transfer: Minimal assistance Toilet Transfer Method: Sit to stand Toilet Transfer Equipment: Regular height toilet Toileting - Clothing Manipulation and Hygiene: Minimal assistance Where Assessed - Toileting Clothing Manipulation and Hygiene: Standing;Sit to stand from 3-in-1 or toilet      OT Goals(current goals can now be found in the care plan section)    Visit Information  Last OT Received On: 04/03/13 History of Present Illness: Krishang Reading is an 79 y.o. 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 Julien Nordmann, who was admitted directly from the [facility] 04/01/13 with dehydration, failure to thrive, and poor oral intake. He  is status post 2 cycles of Abraxane and carboplatin, last dose given 03/24/1541 of NSCLC.          Cognition  Cognition Arousal/Alertness: Awake/alert Behavior During Therapy: WFL for tasks assessed/performed;Flat affect Overall Cognitive Status: Within Functional Limits for tasks assessed Area of Impairment: Memory;Awareness;Problem solving    Mobility  Bed Mobility Overal bed mobility: Needs Assistance Bed Mobility: Supine to Sit;Sit to Supine Supine to sit: Min guard Sit to supine: Min guard General bed mobility comments: for safety, incr time Transfers Overall transfer level: Needs assistance Equipment used: None Transfers: Sit to/from Stand Sit to Stand: Min assist General transfer comment: for safety          End of Session OT - End of Session Activity Tolerance: Patient tolerated treatment well Patient left: in bed;with call bell/phone within reach;with family/visitor present Nurse Communication: Mobility status  GO     Betsy Pries 04/03/2013, 12:46 PM

## 2013-04-03 NOTE — Progress Notes (Signed)
Patient given barium contrast to drink, half of the dose completed at this time, will continue to monitor and encourage to consume more.

## 2013-04-03 NOTE — Progress Notes (Signed)
SLP Cancellation Note  Patient Details Name: Samuel Hurley MRN: [id number] DOB: 1934-10-02   Cancelled treatment: Received order for BSE.  Attempt x1 but not completed.  RN reports this am patient with lethargy with inability to participate fully.  ST to f/u for POC on 04/04/13.   Sharman Crate [place], [facility] 04/03/2013, 9:58 AM

## 2013-04-03 NOTE — Progress Notes (Addendum)
NUTRITION FOLLOW UP  Intervention:   - After PEG tube is placed, initiate Jevity 1.2 @ 20 ml/hr via peg and increase by 10 ml every 4 hours to goal rate of 75 ml/hr. At goal rate, tube feeding regimen will provide 2160 kcal (100% of estimated needs), 100 grams of protein (100% of estimated needs), and 1450 ml of H2O.   - Provide additional free water flushes of 200 mL every 6 hours. Additional free water flushes will provide additional 800 ml of water to meet 100% of estimated needs.  - RD will continue to monitor  Nutrition Dx:   Inadequate oral intake related to inability to eat as evidenced by peg tube placement request; ongoing  Goal:   Pt to meet >/= 90% of their estimated nutrition needs; not met  Monitor:   Diet order, total protein/energy intake, labs, weights, swallow profile  Assessment:   The patient is a 79 y.o. year-old male with stage 4 squamous cell CA of the pharynx 2007, small cell lung cancer 07/2012, and NSCLC dx 12/2012 followed by Dr. Julien Nordmann who presents with inability to eat and dehydration.   Because of his head and neck cancer, he has had problems with dry mouth and dysphagia and in the setting of chemotherapy, he has not been able to eat and drink much. Pt wishes to have Peg tube placed to improve nutritional status and to continue chemotherapy. Pt has problems with constipation and is receiving dulcolax.  Pt's wife stressed that pt is not eating enough to meet calorie needs, and that he needs 100% of his needs given through tube even though he does eat some foods occasionally. She says that pt does not take any liquids by mouth including oral nutritional supplements. Pt will need to transition to bolus feeds prior to discharge. Pt seen by SLP.   Height: Ht Readings from Last 1 Encounters:  04/02/13 6' (1.829 m)    Weight Status:   Wt Readings from Last 1 Encounters:  04/03/13 170 lb 10.2 oz (77.4 kg)    Re-estimated needs:  Kcal: 2100-2300 Protein:  100-115 g Fluid: 2.1-2.3 L/day  Skin: Intact  Diet Order: General   Intake/Output Summary (Last 24 hours) at 04/03/13 1042 Last data filed at 04/02/13 2200  Gross per 24 hour  Intake 1536.25 ml  Output      0 ml  Net 1536.25 ml    Last BM: 3/14   Labs:   Recent Labs Lab 03/31/13 1134 04/01/13 1530 04/02/13 0448  NA 139  --  136*  K 3.4*  --  3.3*  CL  --   --  98  CO2 27  --  27  BUN 20.7  --  13  CREATININE 0.9 0.89 0.85  CALCIUM 9.1  --  8.5  MG  --   --  1.5  GLUCOSE 108  --  137*    CBG (last 3)  No results found for this basename: GLUCAP,  in the last 72 hours  Scheduled Meds: . docusate sodium  200 mg Oral BID  . enoxaparin (LOVENOX) injection  40 mg Subcutaneous Daily  . fentaNYL  50 mcg Transdermal Q72H  . finasteride  5 mg Oral QHS  . fluticasone  1 spray Each Nare Daily  . [START ON 04/04/2013] levothyroxine  100 mcg Oral QAC breakfast  . LORazepam  1 mg Oral 3 times per day  . senna  2 tablet Oral QHS  . sertraline  100 mg Oral Daily  .  terazosin  1 mg Oral BID    Continuous Infusions: . dextrose 5 % and 0.45 % NaCl with KCl 20 mEq/L 75 mL/hr (04/03/13 1022)    Terrace Arabia RD, LDN

## 2013-04-04 ENCOUNTER — Inpatient Hospital Stay (HOSPITAL_COMMUNITY): Payer: Medicare HMO

## 2013-04-04 DIAGNOSIS — Z483 Aftercare following surgery for neoplasm: Secondary | ICD-10-CM

## 2013-04-04 DIAGNOSIS — D491 Neoplasm of unspecified behavior of respiratory system: Secondary | ICD-10-CM

## 2013-04-04 DIAGNOSIS — Z85038 Personal history of other malignant neoplasm of large intestine: Secondary | ICD-10-CM

## 2013-04-04 DIAGNOSIS — E46 Unspecified protein-calorie malnutrition: Secondary | ICD-10-CM

## 2013-04-04 DIAGNOSIS — R1319 Other dysphagia: Secondary | ICD-10-CM

## 2013-04-04 DIAGNOSIS — Z85819 Personal history of malignant neoplasm of unspecified site of lip, oral cavity, and pharynx: Secondary | ICD-10-CM

## 2013-04-04 LAB — CBC
HCT: 20.9 % — ABNORMAL LOW (ref 39.0–52.0)
HEMOGLOBIN: 6.5 g/dL — AB (ref 13.0–17.0)
MCH: 27.4 pg (ref 26.0–34.0)
MCHC: 31.1 g/dL (ref 30.0–36.0)
MCV: 88.2 fL (ref 78.0–100.0)
Platelets: 122 10*3/uL — ABNORMAL LOW (ref 150–400)
RBC: 2.37 MIL/uL — ABNORMAL LOW (ref 4.22–5.81)
RDW: 17 % — ABNORMAL HIGH (ref 11.5–15.5)
WBC: 2 10*3/uL — ABNORMAL LOW (ref 4.0–10.5)

## 2013-04-04 LAB — PROTIME-INR
INR: 1.27 (ref 0.00–1.49)
PROTHROMBIN TIME: 15.6 s — AB (ref 11.6–15.2)

## 2013-04-04 LAB — PREPARE RBC (CROSSMATCH)

## 2013-04-04 LAB — APTT: aPTT: 41 seconds — ABNORMAL HIGH (ref 24–37)

## 2013-04-04 MED ORDER — CEFAZOLIN SODIUM-DEXTROSE 2-3 GM-% IV SOLR
INTRAVENOUS | Status: AC
  Filled 2013-04-04: qty 50

## 2013-04-04 MED ORDER — LORAZEPAM 2 MG/ML IJ SOLN
1.0000 mg | Freq: Once | INTRAMUSCULAR | Status: AC
  Administered 2013-04-04: 1 mg via INTRAVENOUS
  Filled 2013-04-04: qty 1

## 2013-04-04 MED ORDER — FENTANYL CITRATE 0.05 MG/ML IJ SOLN
INTRAMUSCULAR | Status: AC | PRN
  Administered 2013-04-04 (×2): 25 ug via INTRAVENOUS

## 2013-04-04 MED ORDER — IOHEXOL 300 MG/ML  SOLN
10.0000 mL | Freq: Once | INTRAMUSCULAR | Status: AC | PRN
  Administered 2013-04-04: 10 mL

## 2013-04-04 MED ORDER — HYDROCODONE-ACETAMINOPHEN 5-325 MG PO TABS: 1.0000 | ORAL_TABLET | ORAL | Status: DC | PRN

## 2013-04-04 MED ORDER — GLUCAGON HCL (RDNA) 1 MG IJ SOLR
INTRAMUSCULAR | Status: AC
  Filled 2013-04-04: qty 1

## 2013-04-04 MED ORDER — FENTANYL CITRATE 0.05 MG/ML IJ SOLN
INTRAMUSCULAR | Status: AC
  Filled 2013-04-04: qty 6

## 2013-04-04 MED ORDER — MIDAZOLAM HCL 2 MG/2ML IJ SOLN
INTRAMUSCULAR | Status: AC
  Filled 2013-04-04: qty 6

## 2013-04-04 MED ORDER — HYDROMORPHONE HCL PF 1 MG/ML IJ SOLN
1.0000 mg | INTRAMUSCULAR | Status: DC | PRN
  Administered 2013-04-05: 1 mg via INTRAVENOUS
  Filled 2013-04-04: qty 1

## 2013-04-04 MED ORDER — MIDAZOLAM HCL 2 MG/2ML IJ SOLN
INTRAMUSCULAR | Status: AC | PRN
  Administered 2013-04-04 (×2): 0.5 mg via INTRAVENOUS
  Administered 2013-04-04: 1 mg via INTRAVENOUS

## 2013-04-04 MED ORDER — GLUCAGON HCL (RDNA) 1 MG IJ SOLR
INTRAMUSCULAR | Status: AC | PRN
  Administered 2013-04-04: .5 mg via INTRAVENOUS

## 2013-04-04 MED ORDER — ONDANSETRON HCL 4 MG/2ML IJ SOLN: 4.0000 mg | INTRAMUSCULAR | Status: DC | PRN

## 2013-04-04 NOTE — Progress Notes (Signed)
CRITICAL VALUE ALERT  Critical value received:hgb 6.5   Date of notification:  04/04/13  Time of notification:  0522  Critical value read back:yes  Nurse who received alert:  D Rayburn Felt  MD notified (1st page):  Fredirick Maudlin  Time of first page:  0529  MD notified (2nd page):  Time of second page:  Responding MD:  Fredirick Maudlin  Time MD responded:  [phone]

## 2013-04-04 NOTE — Progress Notes (Signed)
Occupational Therapy Treatment Patient Details Name: Samuel Hurley MRN: [id number] DOB: [birth year] Today's Date: 04/04/2013 Time: 1355-1406 OT Time Calculation (min): 11 min  OT Assessment / Plan / Recommendation  History of present illness Samuel Hurley is an 79 y.o. 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 Julien Nordmann, who was admitted directly from the [facility] 04/01/13 with dehydration, failure to thrive, and poor oral intake. He is status post 2 cycles of Abraxane and carboplatin, last dose given 03/24/1541 of NSCLC.   OT comments  Pt voiced frustration with not being able to eat this day and waiting for surgery (PEG)  Follow Up Recommendations  Home health OT             Frequency Min 2X/week   Progress towards OT Goals Progress towards OT goals: Progressing toward goals  Plan Discharge plan remains appropriate    Precautions / Restrictions Precautions Precautions: Fall       ADL  Toilet Transfer: Minimal assistance Toilet Transfer Method: Sit to stand Toilet Transfer Equipment: Comfort height toilet Toileting - Clothing Manipulation and Hygiene: Minimal assistance Where Assessed - Toileting Clothing Manipulation and Hygiene: Standing ADL Comments: Pt awaiting PEG tube- but did agree to OOB.  Pt more lethargic this OT visit, but did perform functional mobility with OT ,  Pt overall min  A with mobility this OT visit.      OT Goals(current goals can now be found in the care plan section)    Visit Information  Last OT Received On: 04/04/13 Assistance Needed: +1 History of Present Illness: Samuel Hurley is an 79 y.o. 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 Julien Nordmann, who was admitted directly from the [facility] 04/01/13 with dehydration, failure to thrive, and poor oral intake. He is status post 2 cycles of Abraxane and carboplatin, last dose given 03/24/1541 of NSCLC.    Subjective Data         Cognition  Cognition Arousal/Alertness: Awake/alert Behavior During Therapy: Flat affect Overall Cognitive Status: Within Functional Limits for tasks assessed Area of Impairment: Safety/judgement;Problem solving;Memory Safety/Judgement: Decreased awareness of deficits General Comments: pt needed several VC to wait on OT to unplug IV even after OT explained situation to pt    Mobility  Bed Mobility Overal bed mobility: Needs Assistance Bed Mobility: Supine to Sit;Sit to Supine Supine to sit: Supervision Sit to supine: Supervision General bed mobility comments: for safety, incr time Transfers Overall transfer level: Needs assistance Equipment used: None Transfers: Sit to/from Stand Sit to Stand: Min assist General transfer comment: for safety          End of Session OT - End of Session Activity Tolerance: Patient limited by fatigue Patient left: in bed;with call bell/phone within reach;with family/visitor present  [place], Thereasa Parkin 04/04/2013, 2:19 PM

## 2013-04-04 NOTE — Procedures (Signed)
20F gastrostomy tube placement under fluoro No complication No blood loss. See complete dictation in [facility] PACS.  

## 2013-04-04 NOTE — Progress Notes (Signed)
DIAGNOSIS AND STAGE:  1) Poorly differentiated non-small cell lung cancer diagnosed in December of 2014  2) Extensive stage small cell lung cancer diagnosed in July 2014.  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  4) History of oncocytic renal tumor status post radiofrequency ablation at [facility] in August of 2008.   PRIOR THERAPY: Systemic chemotherapy with carboplatin for AUC of 5 on day 1 and etoposide 120 mg/M2 on days 1, 2 and 3 with Neulasta support on day 4, status post 4 cycles. First dose was given on 08/18/2012.   CURRENT THERAPY: Systemic chemotherapy with carboplatin for AUC of 5 on day 1 and Abraxane 100 mg/M2 on days 1, 8 and 15 every 3 weeks. Status post 1 cycle as well as day 1 of cycle #2.  Subjective: The patient is seen and examined today. His wife was at the bedside. He is feeling a good bit better today with no significant complaints except for persistent fatigue. He was able to eat small meals but still complaining of dysphagia and expected to have a PEG tube placed today. He denied having any significant fever or chills. He has no nausea or vomiting. The patient denied having any significant chest pain but continues to have shortness breath with exertion.  Objective: Vital signs in last 24 hours: Temp:  [97.4 F (36.3 C)-98.9 F (37.2 C)] 98.7 F (37.1 C) (03/16 1803) Pulse Rate:  [71-94] 81 (03/16 1803) Resp:  [13-19] 18 (03/16 1803) BP: (107-136)/(40-65) 136/54 mmHg (03/16 1803) SpO2:  [93 %-100 %] 93 % (03/16 1803)  Intake/Output from previous day: 03/15 0701 - 03/16 0700 In: 3120 [P.O.:720; I.V.:2400] Out: -  Intake/Output this shift: Total I/O In: 888.8 [I.V.:557.5; Blood:331.3] Out: -   General appearance: alert, cooperative, fatigued and no  distress Resp: diminished breath sounds RLL Cardio: regular rate and rhythm, S1, S2 normal, no murmur, click, rub or gallop GI: soft, non-tender; bowel sounds normal; no masses,  no organomegaly Extremities: extremities normal, atraumatic, no cyanosis or edema  Lab Results:   Recent Labs  04/03/13 1200 04/04/13 0413  WBC 3.1* 2.0*  HGB 7.5* 6.5*  HCT 24.4* 20.9*  PLT 160 122*   BMET  Recent Labs  04/02/13 0448 04/03/13 1200  NA 136* 133*  K 3.3* 4.3  CL 98 94*  CO2 27 27  GLUCOSE 137* 136*  BUN 13 10  CREATININE 0.85 0.79  CALCIUM 8.5 8.9    Studies/Results: Ir Gastrostomy Tube Mod Sed  04/04/2013   CLINICAL DATA:  Small cell lung carcinoma. Needs enteral feeding support.  EXAM: PERC PLACEMENT GASTROSTOMY  FLUOROSCOPY TIME:  1 min 12 seconds  TECHNIQUE: The procedure, risks, benefits, and alternatives were explained to the patient. Questions regarding the procedure were encouraged and answered. The patient understands and consents to the procedure. As antibiotic prophylaxis, cefazolin 2 g was ordered pre-procedure and administered intravenously within one hour of incision.Progression of previously administered oral barium into the colon was confirmed fluoroscopically. A 5 French angiographic catheter was placed as orogastric tube. The upper abdomen was prepped with Betadine, draped in usual sterile fashion, and infiltrated locally with 1% lidocaine.  Intravenous Fentanyl and Versed were administered as conscious sedation during continuous cardiorespiratory monitoring by the radiology RN, with a total moderate sedation time of 13 minutes.  Stomach  was insufflated using air through the orogastric tube. An 40 French sheath needle was advanced percutaneously into the gastric lumen under fluoroscopy. Gas could be aspirated and a small contrast injection confirmed intraluminal spread. The sheath was exchanged over a guidewire for a 9 [place] vascular sheath, through which the snare device  was advanced and used to snare a guidewire passed through the orogastric tube. This was withdrawn, and the snare attached to the 20 French pull-through gastrostomy tube, which was advanced antegrade, positioned with the internal bumper securing the anterior gastric wall to the anterior abdominal wall. Small contrast injection confirms appropriate positioning. The external bumper was applied and the catheter was flushed. No immediate complication.  IMPRESSION: 1. Technically successful 20 French pull-through gastrostomy placement under fluoroscopy.   Electronically Signed   By: Arne Cleveland M.D.   On: 04/04/2013 16:49   Dg Abd Portable 1v  04/04/2013   CLINICAL DATA:  Evaluate barium and bowel for gastrostomy tube placement.  EXAM: PORTABLE ABDOMEN - 1 VIEW  COMPARISON:  CT of the abdomen and pelvis 02/23/2013.  FINDINGS: There is a small amount of barium seen throughout the colon. No distal rectal gas identified at this time. Colon is not appear distended. There is a paucity of bowel gas throughout the right side of the abdomen, presumably related to hepatic enlargement. No pathologic distention of small bowel. No gross pneumoperitoneum. Several colonic diverticulae are noted.  IMPRESSION: 1. Small volume of barium in the colon, as above. 2. Displacement of bowel loops from the right side of the abdomen suggests hepatomegaly. 3. Colonic diverticulosis.   Electronically Signed   By: Vinnie Langton M.D.   On: 04/04/2013 07:14    Medications: I have reviewed the patient's current medications.  Assessment/Plan: 1) history of extensive stage small cell lung cancer status post 4 cycles of systemic chemotherapy with carboplatin and etoposide. 2) metastatic poorly differentiated non-small cell lung cancer diagnosed in December of 2014 and the patient is currently on systemic chemotherapy with carboplatin and Abraxane status post 1 cycle and he is currently undergoing cycle #2. His chemotherapy is currently on  hold because of the recent dehydration, fatigue and weakness. 3) malnutrition: The patient is expected to have a PEG tube placed today by interventional radiology. Consult dietation for recommendation regarding supplemental diet.  4) chemotherapy-induced anemia: Consider the patient for 2 units of PRBCs transfusion. 5) dehydration: Improved. Thank you so much for taking good care of [name], I will continue to follow up the patient with you and assist in his management on as needed basis.   LOS: 3 days    Ricarda Atayde K. 04/04/2013

## 2013-04-04 NOTE — Progress Notes (Signed)
PT Cancellation Note  Patient Details Name: Samuel Hurley MRN: [id number] DOB: 1934-06-11   Cancelled Treatment:    Reason Eval/Treat Not Completed: Fatigue/lethargy limiting ability to participate (pt is waiting to go home and does not want to do PT today.  His wife is anticipating HHPT to help pt get stronger)   Norwood Levo 04/04/2013, 11:30 AM

## 2013-04-04 NOTE — Evaluation (Signed)
SLP Cancellation Note  Patient Details Name: Vihaan Gloss MRN: [id number] DOB: [birth year]   Cancelled treatment:       Reason Eval/Treat Not Completed: Other (comment);Medical issues which prohibited therapy (pt npo, ? for PEG)   Luanna Salk, [place] [facility] SLP [phone]

## 2013-04-04 NOTE — Care Management Note (Signed)
   CARE MANAGEMENT NOTE 04/04/2013  Patient:  Samuel Hurley, Samuel Hurley   Account Number:  [id number]  Date Initiated:  04/04/2013  Documentation initiated by:  Samuel Hurley  Subjective/Objective Assessment:   79 yo male admitted with malnutrition and dysphagia.PCP: Samuel Hare, MD     Action/Plan:   Home when stable   Anticipated DC Date:  04/05/2013   Anticipated DC Plan:  [place]  CM consult      Choice offered to / List presented to:  C-1 Patient   DME arranged  NA      DME agency  NA     [place] arranged  HH-1 RN  [place].   Status of service:  In process, will continue to follow Medicare Important Message given?   (If response is "NO", the following Medicare IM given date fields will be blank) Date Medicare IM given:   Date Additional Medicare IM given:    Discharge Disposition:    Per UR Regulation:  Reviewed for med. necessity/level of care/duration of stay  If discussed at [place] of Stay Meetings, dates discussed:    Comments:  04/04/13 1118 Samuel Amescua,MSN,RN [phone] CM consult for [place].Cm spoke with patient and spouse at the bedside. Per pt currently active with [facility]. [facility] rep Samuel Hurley notified of pt's scheduled PEG tube placement. Pt anticipated to dc on bolus tube feeds.

## 2013-04-04 NOTE — Progress Notes (Signed)
PROGRESS NOTE   Samuel Hurley WIO:[id number] DOB: [birth year] DOA: 04/01/2013 PCP: [name], MD  Brief narrative: Samuel Hurley is an 79 y.o. 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 Julien Nordmann, who was admitted directly from the [facility] 04/01/13 with dehydration, failure to thrive, and poor oral intake. He is status post 2 cycles of Abraxane and carboplatin, last dose given 03/24/1541 of NSCLC. Because of ongoing issues with poor nutrition secondary to dysphasia, a PEG tube is being placed for initiation of supplemental tube feeding.  Assessment/Plan: Principal Problem:   Dehydration with generalized weakness, adult failure to thrive, frequent falls, and severe protein calorie malnutrition in the setting of advanced malignancy Patient was started on IV fluids and a dietitian consultation was requested.  PT/OT consultations requested to evaluate generalized weakness, frequent falls with recommendations for home PT. A lengthy discussion was held with the patient and his wife and he clearly wants to finish his chemotherapy and improve his nutritional status with placement of a PEG tube for supplemental tube feeding. For PEG tube placement today.  Dietician has provided TF recommendations. Active Problems:  Panic attack The patient developed agitation and belligerence, threatening to leave, on the morning of 04/03/13. Responded to Ativan.  Wife tells me that "he gets that way" if he doesn't get his Ativan on time, and that he has suffered with panic attacks since his lung cancer diagnosis.  Continue Ativan Q 8 hours as a standing order, and add Ativan 1 mg IV Q 6 hours PRN.   XEROSTOMIA with dysphagia Continue biotene.  ST evaluation requested, PEG planned.   Small cell lung cancer / Non-small cell cancer of right lung Dr. Julien Nordmann notified of the patient's  admission. Patient has completed 2/6 cycles of chemotherapy which he wishes to continue.   Hypokalemia Repleting.   Antineoplastic chemotherapy induced anemia Hemoglobin 6.5. For 1 unit of packed red blood cells today. No evidence of B12 or folate deficiency. Iron studies consistent with anemia of chronic disease.   Constipation Continue Colace, senna with magnesium citrate and bisacodyl when necessary.   Chronic pain Continue fentanyl patch and oxycodone as needed.   BPH Continue finasteride and terasozin.   Depression, uncontrolled Continue Zoloft.   Hypothyroidism Synthroid increased to 100 mcg daily. TSH markedly elevated. Will need followup TSH in 4-6 weeks.   DVT Prophylaxis Continue Lovenox.  Code Status: DNR Family Communication: Wife updated at bedside. Disposition Plan: Home when stable.   IV access:  Port-A-Cath  Medical Consultants:  Palliative care  Other Consultants:  Physical therapy: Home health PT  Occupational therapy  Dietitian  Speech therapy  Anti-infectives:  None  HPI/Subjective: Samuel Hurley is calm this morning. He is without complaints other than wanting something to eat. No complaints of current pain, nausea, vomiting or dyspnea.  Objective: Filed Vitals:   04/03/13 1640 04/03/13 2040 04/04/13 0600 04/04/13 0839  BP: 104/51 124/58 120/52 120/40  Pulse:  71 75 90  Temp:  97.7 F (36.5 C) 97.6 F (36.4 C) 97.4 F (36.3 C)  TempSrc:  Oral Oral Oral  Resp:  14 16 16   Height:      Weight:      SpO2:  100% 100% 95%    Intake/Output Summary (Last 24 hours) at 04/04/13 0901 Last data filed at 04/04/13 0600  Gross per 24 hour  Intake   3000 ml  Output      0 ml  Net   3000 ml    Exam: Gen:  Resting quietly Cardiovascular:  RRR, No M/R/G Respiratory:  Lungs CTAB Gastrointestinal:  Abdomen soft, NT/ND, + BS Extremities:  No C/E/C  Data Reviewed: Basic Metabolic Panel:  Recent Labs Lab 03/31/13 1134  04/01/13 1530 04/02/13 0448 04/03/13 1200  NA 139  --  136* 133*  K 3.4*  --  3.3* 4.3  CL  --   --  98 94*  CO2 27  --  27 27  GLUCOSE 108  --  137* 136*  BUN 20.7  --  13 10  CREATININE 0.9 0.89 0.85 0.79  CALCIUM 9.1  --  8.5 8.9  MG  --   --  1.5  --    GFR Estimated Creatinine Clearance: 86 ml/min (by C-G formula based on Cr of 0.79). Liver Function Tests:  Recent Labs Lab 03/31/13 1134  AST 10  ALT 14  ALKPHOS 141  BILITOT 0.37  PROT 6.5  ALBUMIN 2.1*   CBC:  Recent Labs Lab 03/31/13 1134 04/01/13 1530 04/02/13 0448 04/03/13 1200 04/04/13 0413  WBC 4.6 3.9* 3.7* 3.1* 2.0*  NEUTROABS 3.8  --   --   --   --   HGB 8.1* 7.3* 7.3* 7.5* 6.5*  HCT 25.3* 22.7* 22.2* 24.4* 20.9*  MCV 87.5 86.0 87.4 88.4 88.2  PLT 185 169 154 160 122*   BNP (last 3 results)  Recent Labs  12/11/12 1220  PROBNP 430.0   Thyroid function studies  Recent Labs  04/02/13 0448  TSH 20.802*   Anemia work up  Recent Labs  04/02/13 0448  VITAMINB12 1531*  FERRITIN 2032*  TIBC 113*  IRON 14*   Microbiology No results found for this or any previous visit (from the past 240 hour(s)).   Procedures and Diagnostic Studies: No results found.  Scheduled Meds: .  ceFAZolin (ANCEF) IV  2 g Intravenous 60 min Pre-Op  . docusate sodium  200 mg Oral BID  . enoxaparin (LOVENOX) injection  40 mg Subcutaneous Daily  . fentaNYL  50 mcg Transdermal Q72H  . finasteride  5 mg Oral QHS  . fluticasone  1 spray Each Nare Daily  . levothyroxine  100 mcg Oral QAC breakfast  . LORazepam  1 mg Oral 3 times per day  . senna  2 tablet Oral QHS  . sertraline  100 mg Oral Daily  . terazosin  1 mg Oral BID   Continuous Infusions: . dextrose 5 % and 0.45 % NaCl with KCl 20 mEq/L 75 mL/hr (04/03/13 1022)    Time spent: 25 minutes.    LOS: 3 days   [place] [facility] Pager [phone]. If unable to reach me by pager, please call my cell phone at [phone].  *Please note  that the hospitalists switch teams on Wednesdays. Please call the flow manager at [phone] if you are having difficulty reaching the hospitalist taking care of this patient as she can update you and provide the most up-to-date pager number of provider caring for the patient. If 8PM-8AM, please contact night-coverage at [email or web address], password [facility]  04/04/2013, 9:01 AM    **Disclaimer: This note was dictated with voice recognition software. Similar sounding words can inadvertently be transcribed and this note may contain transcription errors which may not have been corrected upon publication of note.**

## 2013-04-05 LAB — PROTEIN ELECTROPHORESIS, SERUM
Albumin ELP: 40.2 % — ABNORMAL LOW (ref 55.8–66.1)
Alpha-1-Globulin: 12.1 % — ABNORMAL HIGH (ref 2.9–4.9)
Alpha-2-Globulin: 20.8 % — ABNORMAL HIGH (ref 7.1–11.8)
BETA GLOBULIN: 6.2 % (ref 4.7–7.2)
Beta 2: 5.2 % (ref 3.2–6.5)
GAMMA GLOBULIN: 15.5 % (ref 11.1–18.8)
M-SPIKE, %: NOT DETECTED g/dL
TOTAL PROTEIN ELP: 5.2 g/dL — AB (ref 6.0–8.3)

## 2013-04-05 LAB — BASIC METABOLIC PANEL
BUN: 8 mg/dL (ref 6–23)
CHLORIDE: 93 meq/L — AB (ref 96–112)
CO2: 27 mEq/L (ref 19–32)
Calcium: 8.3 mg/dL — ABNORMAL LOW (ref 8.4–10.5)
Creatinine, Ser: 0.79 mg/dL (ref 0.50–1.35)
GFR calc Af Amer: >90 mL/min (ref >90)
GFR calc non Af Amer: 85 mL/min — ABNORMAL LOW (ref >90)
GLUCOSE: 111 mg/dL — AB (ref 70–99)
Potassium: 3.8 mEq/L (ref 3.7–5.3)
Sodium: 131 mEq/L — ABNORMAL LOW (ref 137–147)

## 2013-04-05 LAB — CBC
HEMATOCRIT: 21.5 % — AB (ref 39.0–52.0)
HEMOGLOBIN: 7 g/dL — AB (ref 13.0–17.0)
MCH: 28.2 pg (ref 26.0–34.0)
MCHC: 32.6 g/dL (ref 30.0–36.0)
MCV: 86.7 fL (ref 78.0–100.0)
Platelets: 119 10*3/uL — ABNORMAL LOW (ref 150–400)
RBC: 2.48 MIL/uL — ABNORMAL LOW (ref 4.22–5.81)
RDW: 16.9 % — ABNORMAL HIGH (ref 11.5–15.5)
WBC: 1.5 10*3/uL — AB (ref 4.0–10.5)

## 2013-04-05 LAB — TYPE AND SCREEN
ABO/RH(D): A POS
ANTIBODY SCREEN: NEGATIVE
UNIT DIVISION: 0

## 2013-04-05 MED ORDER — JEVITY 1.2 CAL PO LIQD: 1000.0000 mL | Freq: Four times a day (QID) | ORAL | Status: DC

## 2013-04-05 MED ORDER — JEVITY 1.2 CAL PO LIQD
1000.0000 mL | ORAL | Status: DC
  Administered 2013-04-05: 1000 mL

## 2013-04-05 MED ORDER — LORAZEPAM 1 MG PO TABS: 1.0000 mg | ORAL_TABLET | Freq: Three times a day (TID) | ORAL | Status: DC

## 2013-04-05 MED ORDER — HYDROCODONE-ACETAMINOPHEN 5-325 MG PO TABS: 1.0000 | ORAL_TABLET | ORAL | Status: DC | PRN

## 2013-04-05 MED ORDER — OXYCODONE HCL 5 MG PO TABS: 5.0000 mg | ORAL_TABLET | ORAL | Status: DC | PRN

## 2013-04-05 MED ORDER — DSS 100 MG PO CAPS: 200.0000 mg | ORAL_CAPSULE | Freq: Two times a day (BID) | ORAL | Status: DC

## 2013-04-05 MED ORDER — JEVITY 1.2 CAL PO LIQD
1000.0000 mL | ORAL | Status: AC
  Administered 2013-04-05: 1000 mL

## 2013-04-05 MED ORDER — HEPARIN SOD (PORK) LOCK FLUSH 100 UNIT/ML IV SOLN
500.0000 [IU] | INTRAVENOUS | Status: DC | PRN
  Filled 2013-04-05: qty 5

## 2013-04-05 MED ORDER — JEVITY 1.2 CAL PO LIQD: 1000.0000 mL | ORAL | Status: DC

## 2013-04-05 NOTE — Progress Notes (Signed)
Per MD order pt.'s wife was shown how to administer bolus tube feedings and scheduled flushes at home. Pt. Wife was able to demonstrate proper technique of feeing and flushing.  All questions were answered.  Will continue to monitor.

## 2013-04-05 NOTE — Care Management Note (Signed)
   CARE MANAGEMENT NOTE 04/05/2013  Patient:  Samuel Hurley, Samuel Hurley   Account Number:  [id number]  Date Initiated:  04/04/2013  Documentation initiated by:  Hannan Hutmacher  Subjective/Objective Assessment:   79 yo male admitted with malnutrition and dysphagia.PCP: [name], MD     Action/Plan:   Home when stable   Anticipated DC Date:  04/05/2013   Anticipated DC Plan:  [place]  CM consult      Choice offered to / List presented to:  C-1 Patient   DME arranged  TUBE FEEDING      DME agency  OTHER - SEE NOTE     [place] arranged  HH-1 RN  [place].   Status of service:  In process, will continue to follow Medicare Important Message given?   (If response is "NO", the following Medicare IM given date fields will be blank) Date Medicare IM given:   Date Additional Medicare IM given:    Discharge Disposition:    Per UR Regulation:  Reviewed for med. necessity/level of care/duration of stay  If discussed at [place] of Stay Meetings, dates discussed:    Comments:  04/05/16 [place] [phone] Patient disposition home with continous tube feeds. Per spouse choice [facility] to provide enteral feedings. [facility] rep Nash Dimmer contacted, rep to provide teaching at bedside prior to discharge. [facility] to provide RN/PT/OT. No other needs assessed at this time.  04/04/13 [place] [phone] CM consult for Home Health Services.Cm spoke with patient and spouse at the bedside. Per pt currently active with [facility]. [facility] rep Lurlean Leyden notified of pt's scheduled PEG tube placement. Pt anticipated to dc on bolus tube feeds.

## 2013-04-05 NOTE — Progress Notes (Signed)
NUTRITION FOLLOW UP  Intervention:   -Initiate Jevity 1.2 @ 20 ml/hr via peg and increase by 10 ml every 4 hours to goal rate of 75 ml/hr. At goal rate, tube feeding regimen will provide 2160 kcal (100% of estimated needs), 100 grams of protein (100% of estimated needs), and 1450 ml of H2O -Once pt tolerating continuous TF, transition to Jevity 1.2 at 474 ml  (two cans) bolus feeds QID with 200 ml flush Q6H. This will provide 2280 kcal (100% est kcal need) and 106 gram protein (100% est protein needs), and 1528 ml free water  Nutrition Dx:   Inadequate oral intake related to inability to eat as evidenced by peg tube placement request; ongoing    Goal:   Pt to meet >/= 90% of their estimated nutrition needs - not met   Monitor:   TF tolerance, total protein/energy intake, labs, weights, GI profile, swallow profile/diet order  Assessment:   3/13:Pt reported prolonged period of suboptimal intake since neck resection in 2008  -Pt's wife reported pt consumes very small amounts of foods (1/4 cup) d/t difficulty swallowing. 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 Used after neck dissection procedure. Wife reported use of high calorie/protein tube feeding of 2-3 bolus cans/daily. Tube has not been used recently  -Endorsed a 17 lbs wt loss in 2.5 weeks (03/17/2013)  -Offered variety of snacks and supplement for pt. All of which were declined  -Per discussion with RN, pt to have SLP evaluation. Family to discuss goals of care. Will monitor to determine if pt wants to restart TF  -Chemo and radiation on hold  -Outpatient RD contacted family in 01/2013 d/t concern for declining weight. Family had  declined outpatient RD appointment  -Pt with constipation- Receiving dulocolax. Had some nausea/vomiting pta. MD noted usually resolves with Reglan  3/15: Because of his head and neck cancer, he has had problems with dry mouth and dysphagia and in the setting of chemotherapy, he has not been able to eat and drink much. Pt wishes to have Peg tube placed to improve nutritional status and to continue chemotherapy. Pt has problems with constipation and is receiving dulcolax.  Pt's wife stressed that pt is not eating enough to meet calorie needs, and that he needs 100% of his needs given through tube even though he does eat some foods occasionally. She says that pt does not take any liquids by mouth including oral nutritional supplements. Pt will need to transition to bolus feeds prior to discharge. Pt seen by SLP.  3/17: -Pt s/p PEG tube placement -Oncology approved use of TF to start today (3/17) -Recommend continuous TF to transition to bolus as tolerated -Per discussion with RN, pt's wife requesting pt be on bolus feeds at home -Jevity 1.2 at 474 ml (two cans) QID will meet est nutrition needs provided by Jevity 1.2 at continuous rate of 75 ml/hr -SLP to reassess once pt 24 hours post PEG placement  Height: Ht Readings from Last 1 Encounters:  04/02/13 6' (1.829 m)    Weight Status:   Wt Readings from Last 1 Encounters:  04/05/13 162 lb 4.1 oz (73.6 kg)    Re-estimated needs:  Kcal: 2100-2300  Protein: 100-115 g  Fluid: 2.1-2.3 L/day    Skin: Intact  Diet Order: Clear Liquid   Intake/Output Summary (Last 24 hours) at 04/05/13 1227 Last data filed at 04/05/13 1015  Gross per 24 hour  Intake 1778.75 ml  Output      0 ml  Net 1778.75 ml    Last BM: 3/14   Labs:   Recent Labs Lab 04/02/13 0448 04/03/13 1200 04/05/13 0545  NA 136* 133* 131*  K 3.3* 4.3 3.8  CL 98 94* 93*  CO2 _0 BUN _1 CREATININE 0.85 0.79 0.79  CALCIUM 8.5 8.9 8.3*  MG 1.5  --    --   GLUCOSE 137* 136* 111*    CBG (last 3)  No results found for this basename: GLUCAP,  in the last 72 hours  Scheduled Meds: . docusate sodium  200 mg Oral BID  . fentaNYL  50 mcg Transdermal Q72H  . finasteride  5 mg Oral QHS  . fluticasone  1 spray Each Nare Daily  . levothyroxine  100 mcg Oral QAC breakfast  . LORazepam  1 mg Oral 3 times per day  . senna  2 tablet Oral QHS  . sertraline  100 mg Oral Daily  . terazosin  1 mg Oral BID    Continuous Infusions: . dextrose 5 % and 0.45 % NaCl with KCl 20 mEq/L 75 mL/hr at 04/05/13 1153  . feeding supplement (JEVITY 1.2 CAL) 1,000 mL (04/05/13 1212)    Atlee Abide MS RD LDN Clinical Dietitian [phone]

## 2013-04-05 NOTE — Progress Notes (Signed)
Subjective: Pt resting quietly; has some abd discomfort at G tube site as expected  Objective: Vital signs in last 24 hours: Temp:  [98 F (36.7 C)-99.7 F (37.6 C)] 99.7 F (37.6 C) (03/17 0600) Pulse Rate:  [76-94] 88 (03/17 0600) Resp:  [13-20] 20 (03/17 0600) BP: (107-149)/(49-65) 135/57 mmHg (03/17 0600) SpO2:  [93 %-100 %] 96 % (03/17 0600) Weight:  [162 lb 4.1 oz (73.6 kg)] 162 lb 4.1 oz (73.6 kg) (03/17 0600) Last BM Date: 04/02/13  Intake/Output from previous day: 03/16 0701 - 03/17 0700 In: 2110 [I.V.:1778.8; Blood:331.3] Out: -  Intake/Output this shift:     G tube intact, insertion site ok, mildly tender; abd- soft, few BS  Lab Results:   Recent Labs  04/04/13 0413 04/05/13 0545  WBC 2.0* 1.5*  HGB 6.5* 7.0*  HCT 20.9* 21.5*  PLT 122* 119*   BMET  Recent Labs  04/03/13 1200 04/05/13 0545  NA 133* 131*  K 4.3 3.8  CL 94* 93*  CO2 27 27  GLUCOSE 136* 111*  BUN 10 8  CREATININE 0.79 0.79  CALCIUM 8.9 8.3*   PT/INR  Recent Labs  04/04/13 0413  LABPROT 15.6*  INR 1.27   ABG No results found for this basename: PHART, PCO2, PO2, HCO3,  in the last 72 hours  Studies/Results: Ir Gastrostomy Tube Mod Sed  04/04/2013   CLINICAL DATA:  Small cell lung carcinoma. Needs enteral feeding support.  EXAM: PERC PLACEMENT GASTROSTOMY  FLUOROSCOPY TIME:  1 min 12 seconds  TECHNIQUE: The procedure, risks, benefits, and alternatives were explained to the patient. Questions regarding the procedure were encouraged and answered. The patient understands and consents to the procedure. As antibiotic prophylaxis, cefazolin 2 g was ordered pre-procedure and administered intravenously within one hour of incision.Progression of previously administered oral barium into the colon was confirmed fluoroscopically. A 5 French angiographic catheter was placed as orogastric tube. The upper abdomen was prepped with Betadine, draped in usual sterile fashion, and infiltrated  locally with 1% lidocaine.  Intravenous Fentanyl and Versed were administered as conscious sedation during continuous cardiorespiratory monitoring by the radiology RN, with a total moderate sedation time of 13 minutes.  Stomach was insufflated using air through the orogastric tube. An 58 French sheath needle was advanced percutaneously into the gastric lumen under fluoroscopy. Gas could be aspirated and a small contrast injection confirmed intraluminal spread. The sheath was exchanged over a guidewire for a 9 [place] vascular sheath, through which the snare device was advanced and used to snare a guidewire passed through the orogastric tube. This was withdrawn, and the snare attached to the 20 French pull-through gastrostomy tube, which was advanced antegrade, positioned with the internal bumper securing the anterior gastric wall to the anterior abdominal wall. Small contrast injection confirms appropriate positioning. The external bumper was applied and the catheter was flushed. No immediate complication.  IMPRESSION: 1. Technically successful 20 French pull-through gastrostomy placement under fluoroscopy.   Electronically Signed   By: Arne Cleveland M.D.   On: 04/04/2013 16:49   Dg Abd Portable 1v  04/04/2013   CLINICAL DATA:  Evaluate barium and bowel for gastrostomy tube placement.  EXAM: PORTABLE ABDOMEN - 1 VIEW  COMPARISON:  CT of the abdomen and pelvis 02/23/2013.  FINDINGS: There is a small amount of barium seen throughout the colon. No distal rectal gas identified at this time. Colon is not appear distended. There is a paucity of bowel gas throughout the right side of the abdomen, presumably  related to hepatic enlargement. No pathologic distention of small bowel. No gross pneumoperitoneum. Several colonic diverticulae are noted.  IMPRESSION: 1. Small volume of barium in the colon, as above. 2. Displacement of bowel loops from the right side of the abdomen suggests hepatomegaly. 3. Colonic  diverticulosis.   Electronically Signed   By: Vinnie Langton M.D.   On: 04/04/2013 07:14    Anti-infectives: Anti-infectives   Start     Dose/Rate Route Frequency Ordered Stop   04/04/13 1539  ceFAZolin (ANCEF) 2-3 GM-% IVPB SOLR  Status:  Discontinued    Comments:  Elizbeth Squires   : cabinet override      04/04/13 1539 04/04/13 1553   04/04/13 0000  ceFAZolin (ANCEF) IVPB 2 g/50 mL premix    Comments:  Hang ON CALL to xray 3/16   2 g 100 mL/hr over 30 Minutes Intravenous 60 min pre-op 04/03/13 1154 04/04/13 1632      Assessment/Plan: s/p perc G tube 3/16; ok to use tube; other plans as per primary team  LOS: 4 days    ALLRED,D [facility] 04/05/2013

## 2013-04-05 NOTE — Discharge Summary (Signed)
Physician Discharge Summary  Samuel Hurley PIR:[id number] DOB: 08/27/1934 DOA: 04/01/2013  PCP: [name], MD  Admit date: 04/01/2013 Discharge date: 04/05/2013  Recommendations for Outpatient Follow-up:  1. Please continue tube feeds as prescribed: After PEG tube is placed, initiate Jevity 1.2 1 can 4 times per day  - Provide additional free water flushes of 200 mL every 6 hours. Additional free water flushes will provide additional 800 ml of water to meet 100% of estimated needs. 2. As per swallow evaluation, may continue liquids as he tolerates to help swallow meds  *For PCP - increased levothyroxine dose and TSH to be followed up in 4-6 weeks post discharge    Discharge Diagnoses:  Principal Problem:   Dehydration Active Problems:   XEROSTOMIA   Small cell lung cancer   Non-small cell cancer of right lung   Protein-calorie malnutrition, severe   Falls frequently   Hypokalemia   Antineoplastic chemotherapy induced anemia   Generalized weakness   Adult failure to thrive   Panic attack    Discharge Condition: medically stable for discharge home today; pt wife agrees with the discharge plan  Diet recommendation: peg tube feedings as specified above  History of present illness:  79 y.o. 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 Julien Nordmann, who was admitted directly from the [place] 04/01/13 with dehydration, failure to thrive, and poor oral intake. He is status post 2 cycles of Abraxane and carboplatin, last dose given 03/24/1541 of NSCLC. Because of ongoing issues with poor nutrition secondary to dysphasia, a PEG tube  Was placed for initiation of supplemental tube feeding.   Assessment/Plan:   Principal Problem:  Dehydration with generalized weakness, adult failure to thrive, frequent falls, and severe protein calorie malnutrition in the  setting of advanced malignancy  - Patient was started on IV fluids and a dietitian consultation was requested.  - PT/OT recommends for home PT.  - per my colleague TRH a lengthy discussion was held with the patient and his wife where he clearly wanted to finish his chemotherapy and improve his nutritional status with placement of a PEG tube for supplemental tube feeding.   Active Problems:  Panic attack  - can continue ativan per home regimen  XEROSTOMIA with dysphagia  - now has PEG - may use biotene/mouth rinses Small cell lung cancer / Non-small cell cancer of right lung  - pt to follow up with cancer center in regards to continuation of chemotherapy Hypokalemia  - repleted and now WNL Antineoplastic chemotherapy induced anemia  - anemia of chronic disease secondary to malignancy - has received 1 unit PRBC during this hospital stay Constipation  - Continue Colace, senna with magnesium citrate and bisacodyl when necessary.  Chronic pain  - Continue fentanyl patch and oxycodone as needed.  BPH  - Continue finasteride and terasozin.  Depression, uncontrolled  - Continue Zoloft.  Hypothyroidism  - Synthroid increased to 100 mcg daily. TSH markedly elevated. Will need followup TSH in 4-6 weeks.  DVT Prophylaxis  - Lovenox given in hospital   Code Status: DNR  Family Communication: Wife updated at bedside.  Disposition Plan: Home when stable.   IV access:  Port-A-[facility] Medical Consultants:  Palliative care Other Consultants:  Physical therapy: Home health PT  Occupational therapy  Dietitian  Speech therapy Anti-infectives:  None   Signed:  Leisa Lenz, MD  [facility]  04/05/2013, 1:48 PM  Pager #: [phone]   Discharge Exam: Filed Vitals:   04/05/13 0600  BP: 135/57  Pulse: 88  Temp: 99.7 F (37.6 C)  Resp: 20   Filed Vitals:   04/04/13 1803 04/04/13 1842 04/04/13 2135 04/05/13 0600  BP: 136/54 129/55 149/58 135/57  Pulse: 81 76 91 88  Temp:  98.7 F (37.1 C) 98 F (36.7 C) 98 F (36.7 C) 99.7 F (37.6 C)  TempSrc: Oral Oral Oral Oral  Resp: 18 16 20 20   Height:      Weight:    73.6 kg (162 lb 4.1 oz)  SpO2: 93% 100% 99% 96%    Exam:  Gen: Resting quietly  Cardiovascular: RRR, No M/R/G  Respiratory: no wheezing or rhonchi Gastrointestinal: Abdomen soft, NT/ND, + BS  Extremities: No C/E/C  Discharge Instructions  Discharge Orders   Future Appointments Provider Department Dept Phone   04/07/2013 1:15 PM [facility] [phone]   04/07/2013 1:30 PM [facility] Flush Nurse [facility] [phone]   04/07/2013 2:00 PM [facility] [phone]   04/14/2013 1:15 PM [facility] [phone]   04/14/2013 1:30 PM [facility] Flush Nurse [facility] [phone]   04/14/2013 2:00 PM [facility] [phone]   04/21/2013 1:15 PM [facility] [phone]   04/21/2013 1:30 PM [facility] Flush Nurse [facility] [phone]   04/21/2013 2:00 PM [facility] [phone]   04/28/2013 1:15 PM [facility] [phone]   04/28/2013 1:30 PM [facility] Flush Nurse [facility] [phone]   04/28/2013 2:00 PM [facility] [phone]   05/05/2013 1:15 PM [facility] [phone]   05/05/2013 1:30 PM [facility] Flush Nurse [facility] [phone]   05/05/2013 2:00 PM [facility] [phone]   05/12/2013 1:15 PM [facility] [phone]   05/12/2013 1:30 PM [facility] Flush Nurse [facility] [phone]   05/12/2013 2:00 PM [facility] [phone]   05/19/2013 1:15 PM [facility] [phone]   05/19/2013 1:30 PM [facility] Flush Nurse [facility] [phone]   05/19/2013 2:00 PM [facility] [phone]   Future Orders Complete By Expires   Call MD for:  difficulty breathing, headache or visual disturbances  As directed    Call MD for:  persistant dizziness or light-headedness  As directed    Call MD for:  persistant nausea and vomiting  As directed    Call MD for:  severe uncontrolled pain  As directed    Discharge instructions  As directed    Comments:     1. Please continue tube feeds as prescribed: After PEG tube is placed, initiate Jevity 1.2 1 can 4 times per day  - Provide additional free water flushes of 200 mL every 6 hours. Additional free water flushes will provide additional 800 ml of water to meet 100% of estimated needs. 2. As per swallow evaluation, may  continue liquids as he tolerates to help swallow meds   Increase activity slowly  As directed        Medication List    STOP taking these medications       furosemide 20 MG tablet  Commonly known as:  LASIX     oxyCODONE-acetaminophen 10-325 MG per tablet  Commonly known as:  PERCOCET     PRESCRIPTION MEDICATION      TAKE these medications       acetaminophen 500 MG tablet  Commonly known as:  TYLENOL  Take 500 mg by mouth every 4 (four) hours as needed (Pain).     antiseptic oral rinse Liqd  15 mLs by Mouth Rinse route as needed for dry mouth.     DSS 100 MG Caps  Take 200 mg by mouth 2 (two) times daily.     feeding supplement (JEVITY 1.2 CAL) Liqd  Place 1,000 mLs into feeding tube  every 6 (six) hours.     fentaNYL 50 MCG/HR  Commonly known as:  DURAGESIC - dosed mcg/hr  Place 1 patch (50 mcg total) onto the skin every 3 (three) days.     finasteride 5 MG tablet  Commonly known as:  PROSCAR  Take 5 mg by mouth at bedtime.     HYDROcodone-acetaminophen 5-325 MG per tablet  Commonly known as:  NORCO/VICODIN  Take 1-2 tablets by mouth every 4 (four) hours as needed for moderate pain.     levothyroxine 75 MCG tablet  Commonly known as:  SYNTHROID, LEVOTHROID  Take 75 mcg by mouth daily before breakfast.     LORazepam 1 MG tablet  Commonly known as:  ATIVAN  Take 1 tablet (1 mg total) by mouth every 8 (eight) hours.     mometasone 50 MCG/ACT nasal spray  Commonly known as:  NASONEX  Place 2 sprays into the nose daily.     ondansetron 4 MG tablet  Commonly known as:  ZOFRAN  Take 1 tablet (4 mg total) by mouth every 8 (eight) hours as needed for nausea or vomiting.     oxyCODONE 5 MG immediate release tablet  Commonly known as:  Oxy IR/ROXICODONE  Take 1 tablet (5 mg total) by mouth every 4 (four) hours as needed for moderate pain or severe pain.     prochlorperazine 10 MG tablet  Commonly known as:  COMPAZINE  Take 10 mg by mouth every 6 (six) hours as needed for nausea or vomiting.     sertraline 100 MG tablet  Commonly known as:  ZOLOFT  Take 1 tablet (100 mg total) by mouth daily.     terazosin 1 MG capsule  Commonly known as:  HYTRIN  Take 1 mg by mouth 2 (two) times daily.           Follow-up Information   Follow up with [facility].   Contact information:   [address] [place] [address] [phone]       Follow up with [facility].   Contact information:   [phone]       The results of significant diagnostics from this hospitalization (including imaging, microbiology, ancillary and laboratory) are listed below for reference.    Significant Diagnostic Studies: Ir Gastrostomy  Tube Mod Sed  04/04/2013   CLINICAL DATA:  Small cell lung carcinoma. Needs enteral feeding support.  EXAM: PERC PLACEMENT GASTROSTOMY  FLUOROSCOPY TIME:  1 min 12 seconds  TECHNIQUE: The procedure, risks, benefits, and alternatives were  explained to the patient. Questions regarding the procedure were encouraged and answered. The patient understands and consents to the procedure. As antibiotic prophylaxis, cefazolin 2 g was ordered pre-procedure and administered intravenously within one hour of incision.Progression of previously administered oral barium into the colon was confirmed fluoroscopically. A 5 French angiographic catheter was placed as orogastric tube. The upper abdomen was prepped with Betadine, draped in usual sterile fashion, and infiltrated locally with 1% lidocaine.  Intravenous Fentanyl and Versed were administered as conscious sedation during continuous cardiorespiratory monitoring by the radiology RN, with a total moderate sedation time of 13 minutes.  Stomach was insufflated using air through the orogastric tube. An 51 French sheath needle was advanced percutaneously into the gastric lumen under fluoroscopy. Gas could be aspirated and a small contrast injection confirmed intraluminal spread. The sheath was exchanged over a guidewire for a 9 [place] vascular sheath, through which the snare device was advanced and used to snare a guidewire passed through the orogastric tube. This was withdrawn, and the snare attached to the 20 French pull-through gastrostomy tube, which was advanced antegrade, positioned with the internal bumper securing the anterior gastric wall to the anterior abdominal wall. Small contrast injection confirms appropriate positioning. The external bumper was applied and the catheter was flushed. No immediate complication.  IMPRESSION: 1. Technically successful 20 French pull-through gastrostomy placement under fluoroscopy.   Electronically Signed   By: Arne Cleveland M.D.   On:  04/04/2013 16:49   Dg Abd Portable 1v  04/04/2013   CLINICAL DATA:  Evaluate barium and bowel for gastrostomy tube placement.  EXAM: PORTABLE ABDOMEN - 1 VIEW  COMPARISON:  CT of the abdomen and pelvis 02/23/2013.  FINDINGS: There is a small amount of barium seen throughout the colon. No distal rectal gas identified at this time. Colon is not appear distended. There is a paucity of bowel gas throughout the right side of the abdomen, presumably related to hepatic enlargement. No pathologic distention of small bowel. No gross pneumoperitoneum. Several colonic diverticulae are noted.  IMPRESSION: 1. Small volume of barium in the colon, as above. 2. Displacement of bowel loops from the right side of the abdomen suggests hepatomegaly. 3. Colonic diverticulosis.   Electronically Signed   By: Vinnie Langton M.D.   On: 04/04/2013 07:14    Microbiology: No results found for this or any previous visit (from the past 240 hour(s)).   Labs: Basic Metabolic Panel:  Recent Labs Lab 03/31/13 1134 04/01/13 1530 04/02/13 0448 04/03/13 1200 04/05/13 0545  NA 139  --  136* 133* 131*  K 3.4*  --  3.3* 4.3 3.8  CL  --   --  98 94* 93*  CO2 27  --  27 27 27   GLUCOSE 108  --  137* 136* 111*  BUN 20.7  --  13 10 8   CREATININE 0.9 0.89 0.85 0.79 0.79  CALCIUM 9.1  --  8.5 8.9 8.3*  MG  --   --  1.5  --   --    Liver Function Tests:  Recent Labs Lab 03/31/13 1134  AST 10  ALT 14  ALKPHOS 141  BILITOT 0.37  PROT 6.5  ALBUMIN 2.1*   No results found for this basename: LIPASE, AMYLASE,  in the last 168 hours No results found for this basename: AMMONIA,  in the last 168 hours CBC:  Recent Labs Lab 03/31/13 1134 04/01/13 1530 04/02/13 0448 04/03/13 1200 04/04/13 0413 04/05/13 0545  WBC 4.6 3.9* 3.7* 3.1* 2.0*  1.5*  NEUTROABS 3.8  --   --   --   --   --   HGB 8.1* 7.3* 7.3* 7.5* 6.5* 7.0*  HCT 25.3* 22.7* 22.2* 24.4* 20.9* 21.5*  MCV 87.5 86.0 87.4 88.4 88.2 86.7  PLT 185 169 154 160 122*  119*   Cardiac Enzymes: No results found for this basename: CKTOTAL, CKMB, CKMBINDEX, TROPONINI,  in the last 168 hours BNP: BNP (last 3 results)  Recent Labs  12/11/12 1220  PROBNP 430.0   CBG: No results found for this basename: GLUCAP,  in the last 168 hours  Time coordinating discharge: Over 30 minutes

## 2013-04-05 NOTE — Progress Notes (Signed)
Pt. Was discharged home. Wife was given the discharge instructions, prescriptions, and all questions were answered.  Pt. Was transported out via wheelchair where he was transported home by his wife.

## 2013-04-05 NOTE — Evaluation (Signed)
Clinical/Bedside Swallow Evaluation Patient Details  Name: Samuel Hurley MRN: [id number] Date of Birth: Feb 24, 1934  Today's Date: 04/05/2013 Time: 1062-6948 SLP Time Calculation (min): 40 min  Past Medical History:  Past Medical History  Diagnosis Date  . Hyperlipidemia   . OSA (obstructive sleep apnea)   . BPH (benign prostatic hypertrophy)   . Kidney disease     TCC  . Xerostomia   . Pharynx cancer     squamous cell stage 4,s/p XRT,chemo, neck dissection  . Colon polyp     HYPERPLASTIC & TUBULAR ADENOMA(Colonoscopy-Dr.Wittenberg)  . Diverticulosis of colon (without mention of hemorrhage)     (Colonoscopy-Dr.Lares)  . Internal hemorrhoids without mention of complication     (Colonoscopy-Dr.San Simon)  . GERD (gastroesophageal reflux disease)     (EGD-Dr. Velora Heckler)  . Atrophic gastritis without mention of hemorrhage     (EGD-Dr. Velora Heckler)  . History of radiation therapy   . Small cell lung cancer   . Non-small cell lung cancer    Past Surgical History:  Past Surgical History  Procedure Laterality Date  . Appendectomy    . Lumbar laminectomy    . Tonsillectomy    . Bilateral ganglionectomies    . Radical right neck dissection  2008  . Pci-rfa renal cell (tcc) cancer  2009    pt denies  . Cholecystectomy  dec. 2009  . Septoplasty  03/01/12    with double turbinectomy  . Video bronchoscopy Bilateral 08/10/2012    Procedure: VIDEO BRONCHOSCOPY WITH FLUORO;  Surgeon: Rigoberto Noel, MD;  Location: [place];  Service: Cardiopulmonary;  Laterality: Bilateral;  . Shoulder arthroscopy Left 2013  . Thoracentesis Right 10/2012   HPI:  79 yo male adm to [facility] 04/01/13 with dehydration, weakness and malnutrition in setting of advanced malignancies.  PMH + for pharyngeal cancer s/p radical neck dissection/chemoradiation 2008 with xerostomia, lung cancer diagnosed Dec 2014 with pt receiving chemo tx that was recently put on hold due to medical issues.  Pt had reported dysphagia to  MD with ability to eat only small meals and having to cough up and expectorate food.  PEG tube was placed yesterday for nutrition.  SLP swallow evaluation ordered -  but slp has not been able to see pt due to medical,  NPO status, etc.      Assessment / Plan / Recommendation Clinical Impression  Pt's evaluation limited by pt lethargy *nodding off during session in between intake and questions.  RN reports pt receives Ativan at home and here in hospital for agitation.  Suspect mental status/lethargy contributes to his decreased intake.    Oral motor exam unremarkable except pt with absent gag response but reports sensation to tongue depressor, severe xerostomia noted.  Pt given only a few boluses of intake due to his lethargy (jello and water).  Oral holding/transit delays apparent - ? cognitive/lethargy based- but pt responds to SLP verbal/tactile stimulation to swallow.   Pt with decreased laryngeal elevation palpated at bedside and multiple swallows - suspect dry swallows due to oropharyngeal residuals pt is clearing.      No s/s of aspiration with minimal po observed, however pt admits to coughing up food every day during meals.    Pt denies pulmonary infections - which indicates likely tolerance of chronic aspiration.  Pt does admit to nutrition/hydration issues.    SLP educated pt to previous instrumental swallow evaluation results, possible/probable progression of tissue fibrosis impacting swallow, diet modifications to use prn *eg liquids only if  having difficult day and decreased po tolerance and compensation strategies (start meals with liquids, follow solids with liquids, rest if coughing up food, short of breath).   As pt has been managing his dysphagia independently and all education completed, no further SLP indicated.   Anticipate pt will benefit highly from tube feeding due to his dyphagia and lethargy and supplementalal oral po will given him QOL.       Aspiration Risk  Moderate  (chronic due to pharyngeal cancer, weakness)    Diet Recommendation Dysphagia 3 (Mechanical Soft);Thin liquid (ground meats, extra gravy/sauces)   Liquid Administration via: Cup Medication Administration:  (? via peg ? to decrease asp risk or crush if large and not contraindicated) Supervision: Patient able to self feed;Full supervision/cueing for compensatory strategies (pt with delayed swallow, assure swallows before giving more) Compensations: Slow rate;Small sips/bites;Multiple dry swallows after each bite/sip;Follow solids with liquid (start meal with liquids) Postural Changes and/or Swallow Maneuvers: Seated upright 90 degrees;Upright 30-60 min after meal    Other  Recommendations Oral Care Recommendations: Oral care before and after PO   Follow Up Recommendations  None    Frequency and Duration   n/a     Pertinent Vitals/Pain Low grade temp, decreased      Swallow Study Prior Functional Status   see hhx section    General Date of Onset: 04/05/13 HPI: 79 yo male adm to [facility] 04/01/13 with dehydration, weakness and malnutrition in setting of advanced malignancies.  PMH + for pharyngeal cancer s/p radical neck dissection/chemoradiation 2008 with xerostomia, lung cancer diagnosed Dec 2014 with pt receiving chemo tx that was recently put on hold due to medical issues.  Pt had reported dysphagia to MD with ability to eat only small meals and having to cough up and expectorate food.  PEG tube was placed yesterday for nutrition.  SLP swallow evaluation ordered -  but slp has not been able to see pt due to medical,  NPO status, etc.    Type of Study: Bedside swallow evaluation Diet Prior to this Study:  (clear liquids currently) Temperature Spikes Noted: No (low grade temperature today) Respiratory Status: Room air History of Recent Intubation: No Behavior/Cognition: Lethargic;Cooperative;Requires cueing;Decreased sustained attention Oral Cavity - Dentition: Adequate natural dentition  (xerostomic oral cavity) Self-Feeding Abilities: Able to feed self;Needs set up Patient Positioning: Upright in bed (pt declined to sit fully upright due to discomfort) Volitional Cough: Strong Volitional Swallow: Able to elicit (after oral cavity moistened)    Oral/Motor/Sensory Function Overall Oral Motor/Sensory Function:  (generalized weakness, no gag reflex, decreased palatal elevation)   Ice Chips Ice chips: Not tested Other Comments: pt preferred room temperature water   Thin Liquid Thin Liquid: Impaired Presentation: Cup;Spoon;Self Fed Oral Phase Impairments: Impaired anterior to posterior transit Oral Phase Functional Implications: Prolonged oral transit;Oral holding Pharyngeal  Phase Impairments: Decreased hyoid-laryngeal movement;Multiple swallows    Nectar Thick Nectar Thick Liquid: Not tested   Honey Thick Honey Thick Liquid: Not tested   Puree Puree: Impaired Presentation: Spoon Oral Phase Impairments: Impaired anterior to posterior transit Oral Phase Functional Implications: Prolonged oral transit;Oral holding Pharyngeal Phase Impairments: Decreased hyoid-laryngeal movement;Multiple swallows Other Comments: JELLO tested only as pt on clear liquids   Solid   GO    Solid: Not tested Other Comments: pt on clear liquid diet       Luanna Salk, [facility] SLP [phone]

## 2013-04-05 NOTE — Discharge Instructions (Signed)
Dehydration, Adult Dehydration is when you lose more fluids from the body than you take in. Vital organs like the kidneys, brain, and heart cannot function without a proper amount of fluids and salt. Any loss of fluids from the body can cause dehydration.  CAUSES   Vomiting.  Diarrhea.  Excessive sweating.  Excessive urine output.  Fever. SYMPTOMS  Mild dehydration  Thirst.  Dry lips.  Slightly dry mouth. Moderate dehydration  Very dry mouth.  Sunken eyes.  Skin does not bounce back quickly when lightly pinched and released.  Dark urine and decreased urine production.  Decreased tear production.  Headache. Severe dehydration  Very dry mouth.  Extreme thirst.  Rapid, weak pulse (more than 100 beats per minute at rest).  Cold hands and feet.  Not able to sweat in spite of heat and temperature.  Rapid breathing.  Blue lips.  Confusion and lethargy.  Difficulty being awakened.  Minimal urine production.  No tears. DIAGNOSIS  Your caregiver will diagnose dehydration based on your symptoms and your exam. Blood and urine tests will help confirm the diagnosis. The diagnostic evaluation should also identify the cause of dehydration. TREATMENT  Treatment of mild or moderate dehydration can often be done at home by increasing the amount of fluids that you drink. It is best to drink small amounts of fluid more often. Drinking too much at one time can make vomiting worse. Refer to the home care instructions below. Severe dehydration needs to be treated at the hospital where you will probably be given intravenous (IV) fluids that contain water and electrolytes. HOME CARE INSTRUCTIONS   Ask your caregiver about specific rehydration instructions.  Drink enough fluids to keep your urine clear or pale yellow.  Drink small amounts frequently if you have nausea and vomiting.  Eat as you normally do.  Avoid:  Foods or drinks high in sugar.  Carbonated  drinks.  Juice.  Extremely hot or cold fluids.  Drinks with caffeine.  Fatty, greasy foods.  Alcohol.  Tobacco.  Overeating.  Gelatin desserts.  Wash your hands well to avoid spreading bacteria and viruses.  Only take over-the-counter or prescription medicines for pain, discomfort, or fever as directed by your caregiver.  Ask your caregiver if you should continue all prescribed and over-the-counter medicines.  Keep all follow-up appointments with your caregiver. SEEK MEDICAL CARE IF:  You have abdominal pain and it increases or stays in one area (localizes).  You have a rash, stiff neck, or severe headache.  You are irritable, sleepy, or difficult to awaken.  You are weak, dizzy, or extremely thirsty. SEEK IMMEDIATE MEDICAL CARE IF:   You are unable to keep fluids down or you get worse despite treatment.  You have frequent episodes of vomiting or diarrhea.  You have blood or green matter (bile) in your vomit.  You have blood in your stool or your stool looks black and tarry.  You have not urinated in 6 to 8 hours, or you have only urinated a small amount of very dark urine.  You have a fever.  You faint. MAKE SURE YOU:   Understand these instructions.  Will watch your condition.  Will get help right away if you are not doing well or get worse. Document Released: 01/06/2005 Document Revised: 03/31/2011 Document Reviewed: 08/26/2010 [facility] Patient Information 2014 [place], [place]. Failure to Thrive, Adult Adult failure to thrive is a condition that some older people develop. People with this condition are able to do fewer and fewer activities  over time. They may lose interest in being with friends or may not want to eat or drink. This is not a normal part of aging. Many things can cause this. Health problems, long-term disease, depression, bad eating habits, cognitive impairment, or disability may play a role in the development of this condition. Most of the  time, it is important to treat whatever is causing failure to thrive. Sometimes, though, it might not be possible to treat the condition. The person could be nearing the end of life. Then, treatment could make the person suffer longer. CAUSES Sometimes, no specific cause can be found. Factors that have been linked to failure to thrive include:  Diseases and medical conditions, such as:  Cancer.  Diabetes.  Stomach and intestinal (gastrointestinal) problems.  Lung disease.  Liver disease.  Kidney disease.  Heart problems.  Thyroid disease.  Neurologic problems.  Vitamin deficiencies.  Disability. This may be the result of:  A broken hip.  A stroke.  Very bad arthritis.  Infections that last a long time.  A long recovery from a surgery.  Mental health issues.  Medicines.  Eating problems.  Medicine for certain conditions. These conditions include:  Parkinson's disease.  Seizure disorder.  Anxiety.  Pain.  High blood pressure.  Depression.  Infections. SYMPTOMS  Losing weight (more than 5% of total body weight).  Getting more tired than usual after an activity.  Having trouble getting up after sitting.  Not being hungry or thirsty.  Not getting out of bed.  Not wanting to do usual activities.  Being depressed.  Getting infections often.  Having bedsores.  Taking a long time to recover after an injury or a surgery.  Weakness. DIAGNOSIS A physical exam can help a caregiver decide if someone has adult failure to thrive. This may include questions about the person's health presently and in the past. It also may include questions about behavior and mood, such as:  Has activity changed?  Does the person seem sad?  Are eating habits different? The caregiver may ask for a list of all medicines taken because certain medicines can lead to this condition. The list should include prescription and over-the-counter medicines. The caregiver will  likely order some tests. These may include:  Blood tests to check for infection, certain diseases, deficiencies, hormone levels, malnutrition, or dehydration.  Urine tests to check for urinary tract infection or kidney failure.  Imaging tests. Examples are an X-ray, a computed tomography (CT) scan, and magnetic resonance imaging (MRI).  Hearing tests.  Vision tests.  Cognitivetests to check thinking ability.  Activity tests to see if the person can do basic tasks like bathing and dressing. There also are tests to check if someone can shop, cook, or move around safely. The caregiver will check if the person is eating enough healthy food. This may include:  Checking weight.  Having blood tested for cholesterol and protein levels.  Seeing if anything else might be making it hard to eat (tooth problems, poorly fitted dentures, trouble swallowing). The person may need to see a specialist to help with diagnosis or treatment. These specialists may include a speech therapist, physical therapist, occupational therapist, dietitian, or social worker.  TREATMENT Treatment for adult failure to thrive depends on the cause. Caregivers also must decide if a treatment has a good chance of working. It often takes a team of caregivers to find the right treatment. Options may include:  Treatments to cure a disease that can cause adult failure to thrive.  Talk therapy or medicine to treat depression.  A better diet. Eating more often, adding nutritional supplements between meals, or taking vitamins may be suggested. Sometimes, medicine is prescribed to boost appetite.  Medicine changes or stopping a medicine.  Physical therapy.  Moving to a place that offers more aid. HOME CARE INSTRUCTIONS What needs to be done at home varies from person to person. This will depend on what caused the condition and how it is treated. However, basic guidelines include:  Taking any medicine prescribed by the  caregiver. Following the directions carefully is important.  Eating healthy foods. There should be enough calories in each meal. Ask the caregiver if vitamins or nutritional supplements should be taken between meals. Consider talking with a dietitian.  Exercising. Strength training is important. A physical therapist can help set up an exercise program that fits the person.  Making sure the person is safe at home.  Talking with caregivers about what should be done if the person can no longer make decisions for himself or herself. SEEK MEDICAL CARE IF:  There are any questions about medicines.  There are questions about the effects of treatment.  The person is not able to eat well.  The person is not able to move around.  The person feels very sad or hopeless. SEEK IMMEDIATE MEDICAL CARE IF:   The person has thoughts of ending his or her life.  The person cannot eat or drink.  The person does not get out of bed.  Staying at home is no longer safe.  The person has a fever. Document Released: 03/31/2011 Document Reviewed: 03/31/2011 [facility] Patient Information 2014 [place]. Gastrostomy Tube Home Guide A gastrostomy tube is a tube that is surgically placed into the stomach. It is also called a "G-tube." G-tubes are used when a person is unable to eat and drink enough on their own to stay healthy. The tube is inserted into the stomach through a small cut (incision) in the skin. This tube is used for:  Feeding.  Giving medication. GASTROSTOMY TUBE CARE  Wash your hands with soap and water.  Remove the old dressing (if any). Some styles of G-tubes may need a dressing inserted between the skin and the G-tube. Other types of G-tubes do not require a dressing. Ask your health care provider if a dressing is needed.  Check the area where the tube enters the skin (insertion site) for redness, swelling, or pus-like (purulent) drainage. A small amount of clear or tan liquid  drainage is normal. Check to make sure scar tissue (skin) is not growing around the insertion site. This could have a raised, bumpy appearance.  A cotton swab can be used to clean the skin around the tube:  When the G-tube is first put in, a normal saline solution or water can be used to clean the skin.  Mild soap and warm water can be used when the skin around the G-tube site has healed.  Roll the cotton swab around the G-tube insertion site to remove any drainage or crusting at the insertion site. STOMACH RESIDUALS Feeding tube residuals are the amount of liquids that are in the stomach at any given time. Residuals may be checked before giving feedings, medications, or as instructed by your health care provider.  Ask your health care provider if there are instances when you would not start tube feedings depending on the amount or type of contents withdrawn from the stomach.  Check residuals by attaching a syringe to the  G-tube and pull back on the syringe plunger. Note the amount and return the residual back into the stomach. FLUSHING THE G-TUBE  The G-tube should be periodically flushed with clean warm water to keep it from clogging.  Flush the G-tube after feedings or medications. Draw up 30 mL of warm water in a syringe. Connect the syringe to the G-tube and slowly push the water into the tube.  Do not push feedings, medications, or flushes rapidly. Flush the G-tube gently and slowly.  Only use syringes made for G-tubes to flush medications or feedings.  Your health care provider may want the G-tube flushed more often or with more water. If this is the case, follow your health care provider's instructions. FEEDINGS Your health care provider will determine whether feedings are given as a bolus (a certain amount given at one time and at scheduled times) or whether feedings will be given continuously on a feeding pump.   Formulas should be given at room temperature.  If feedings are  continuous, no more than 4 hours worth of feedings should be placed in the feeding bag. This helps prevent spoilage or accidental excess infusion.  Cover and place unused formula in the refrigerator.  If feedings are continuous, stop the feedings when medications or flushes are given. Be sure to restart the feedings.  Feeding bags and syringes should be replaced as instructed by your health care provider. GIVING MEDICATION   In general, it is best if all medications are in a liquid form for G-tube administration. Liquid medications are less likely to clog the G-tube.  Mix the liquid medication with 30 mL (or amount recommended by your health care provider) of warm water.  Draw up the medication into the syringe.  Attach the syringe to the G-tube and slowly push the mixture into the G-tube.  After giving the medication, draw up 30 mL of warm water in the syringe and slowly flush the G-tube.  For pills or capsules, check with your health care provider first before crushing medications. Some pills are not effective if they are crushed. Some capsules are sustained release medications.  If appropriate, crush the pill or capsule and mix with 30 mL of warm water. Using the syringe, slowly push the medication through the tube, then flush the tube with another 30 mL of tap water. G-TUBE PROBLEMS G-tube was pulled out.  Cause: May have been pulled out accidentally.  Solutions: Cover the opening with clean dressing and tape. Call your health care provider right away. The G-tube should be put in as soon as possible (within 4 hours) so the G-tube opening (tract) does not close. The G-tube needs to be put in at a healthcare setting. An X-ray needs to be done to confirm placement before the G-tube can be used again. Redness, irritation, soreness, or foul odor around the gastrostomy site.  Cause: May be caused by leakage or infection.  Solutions: Call your health care provider right away. Large  amount of leakage of fluid or mucus-like liquid present (a large amount means it soaks clothing).  Cause: Many reasons could cause the G-tube to leak.  Solutions: Call your health care provider to discuss the amount of leakage. Skin or scar tissue appears to be growing where tube enters skin.   Cause: Tissue growth may develop around the insertion site if the G-tube is moved or pulled on excessively.  Solutions: Secure tube with tape so that excess movement does not occur. Call your health care provider. G-tube is  clogged.  Cause: Thick formula or medication.  Solutions: Try to slowly push warm water into the tube with a large syringe. Never try to push any object into the tube to unclog it. Do not force fluid into the G-tube. If you are unable to unclog the tube, call your health care provider right away. TIPS  Head of Bed [facility]) position refers to the upright position of a person's upper body.  When giving medications or a feeding bolus, keep the [facility] up as told by your health care provider. Do this during the feeding and for 1 hour after the feeding or medication administration.  If continuous feedings are being given, it is best to keep the [facility] up as told by your health care provider. When ADLs (Activities of Daily Living) are performed and the [facility] needs to be flat, be sure to turn the feeding pump off. Restart the feeding pump when the [facility] is returned to the recommended height.  Do not pull or put tension on the tube.  To prevent fluid backflow, kink the G-tube before removing the cap or disconnecting a syringe.  Check the G-tube length every day. Measure from the insertion site to the end of the G-tube. If the length is longer than previous measurements, the tube may be coming out. Call your health care provider if you notice increasing G-tube length.  Oral care, such as brushing teeth, must be continued.  You may need to remove excess air (vent) from the G-tube. Your health care  provider will tell you if this is needed.  Always call your health care provider if you have questions or problems with the G-tube. SEEK IMMEDIATE MEDICAL CARE IF:   You have severe abdominal pain, tenderness, or abdominal bloating (distension).  You have nausea or vomiting.  You are constipated or have problems moving your bowels.  The G-tube insertion site is red, swollen, has a foul smell, or has yellow or brown drainage.  You have difficulty breathing or shortness of breath.  You have a fever.  You have a large amount of feeding tube residuals.  The G-tube is clogged and cannot be flushed. MAKE SURE YOU:   Understand these instructions.  Will watch your condition.  Will get help right away if you are not doing well or get worse. Document Released: 03/17/2001 Document Revised: 10/27/2012 Document Reviewed: 09/13/2012 [facility] Patient Information 2014 [place]. Care of a Feeding Tube People who have trouble swallowing or cannot take food or medicine by mouth are sometimes given feeding tubes. A feeding tube can go into the nose and down to the stomach or through the skin in the abdomen and into the stomach or small bowel. Some of the names of these feeding tubes are gastrostomy tubes, PEG lines, nasogastric tubes, and gastrojejunostomy tubes.  SUPPLIES NEEDED TO CARE FOR THE TUBE SITE  Clean gloves.  Clean wash cloth, gauze pads, or soft paper towel.  Cotton swabs.  Skin barrier ointment or cream.  Soap and water.  Pre-cut foam pads or gauze (that go around the tube).  Tube tape. TUBE SITE CARE 1. Have all supplies ready and available. 2. Wash hands well. 3. Put on clean gloves. 4. Remove the soiled foam pad or gauze, if present, that is found under the tube stabilizer. Change the foam pad or gauze daily or when soiled or moist. 5. Check the skin around the tube site for redness, rash, swelling, drainage, or extra tissue growth. If you notice any of these,  call your caregiver. 6. Moisten gauze and cotton swabs with water and soap. 7. Wipe the area closest to the tube (right near the stoma) with cotton swabs. Wipe the surrounding skin with moistened gauze. Rinse with water. 8. Dry the skin and stoma site with a dry gauze pad or soft paper towel. Do not use antibiotic ointments at the tube site. 9. If the skin is red, apply a skin barrier cream or ointment (such as petroleum jelly) in a circular motion, using a cotton swab. The cream or ointment will provide a moisture barrier for the skin and helps with wound healing. 10. Apply a new pre-cut foam pad or gauze around the tube. Secure it with tape around the edges. If no drainage is present, foam pads or gauze may be left off. 11. Use tape or an anchoring device to fasten the feeding tube to the skin for comfort or as directed. Rotate where you tape the tube to avoid skin damage from the adhesive. 12. Position the person in a semi-upright position (30 45 degree angle). 13. Throw away used supplies. 14. Remove gloves. 15. Wash hands. SUPPLIES NEEDED TO FLUSH A FEEDING TUBE  Clean gloves.  60 mL syringe (that connects to the feeding tube).  Towel.  Water. FLUSHING A FEEDING TUBE  1. Have all supplies ready and available. 2. Wash hands well. 3. Put on clean gloves. 4. Draw up 30 mL of water in the syringe. 5. Kink the feeding tube while disconnecting it from the feeding-bag tubing or while removing the plug at the end of the tube. Kinking closes the tube and prevents secretions in the tube from spilling out. 6. Insert the tip of the syringe into the end of the feeding tube. Release the kink. Slowly inject the water. 7. If unable to inject the water, the person with the feeding tube should lay on his or her left side. The tip of the tube may be against the stomach wall, blocking fluid flow. Changing positions may move the tip away from the stomach wall. After repositioning, try injecting the water  again.  Do not use a syringe smaller than 60 mL to flush the tubing.  Do not use excessive force to overcome resistance because this could cause the tube to rupture. 8. After injecting the water, remove the syringe. 9. Always flush before giving the first medicine, between medicines, and after the final medicine before starting a feeding. This prevents medicines from clogging the tube.  Do not mix medicines with formula or with other medicines before giving medicines.  Thoroughly flush medicines through the tube so they do not mix with formula. 10. Throw away used supplies. 11. Remove gloves. 12. Wash hands. Document Released: 01/06/2005 Document Revised: 12/24/2011 Document Reviewed: 08/21/2011 [facility] Patient Information 2014 [place].

## 2013-04-05 NOTE — Evaluation (Signed)
SLP Cancellation Note  Patient Details Name: Samuel Hurley MRN: [id number] DOB: 1934/12/28   Cancelled treatment:       Reason Eval/Treat Not Completed: Medical issues which prohibited therapy (RN reports pt must be strictly npo for 24 hours after PEG placed, PEG placed at 3 pm yesterday, will attempt to return for eval when pt able to consume po as schedule allows )   Luanna Salk, [facility] SLP [phone]  [id number]}

## 2013-04-06 ENCOUNTER — Other Ambulatory Visit: Payer: Self-pay | Admitting: *Deleted

## 2013-04-06 ENCOUNTER — Telehealth: Payer: Self-pay | Admitting: *Deleted

## 2013-04-06 NOTE — Telephone Encounter (Signed)
Received call from [place] at [facility] regarding jevity tube feeds. They want to increase his tube feed amount to 7 1/2 cans daily of the jevity 1.2.  Currently 4 cans were only ordered.  Per dr Vista Mink, okay to increase amount of cans.  Marjean Donna will fax over order for Dr Vista Mink to sign.  L[place]day ph# [phone].  Ext [phone].  Informed Dory Peru that pt was started on tube feeds.  SLJ

## 2013-04-06 NOTE — Progress Notes (Signed)
Per Dr Vista Mink, pt needs f/u with [facility] at next cycle of chemo.  Onc tx schedule filled out. SLJ

## 2013-04-07 ENCOUNTER — Other Ambulatory Visit: Payer: Medicare HMO

## 2013-04-07 ENCOUNTER — Telehealth: Payer: Self-pay | Admitting: Internal Medicine

## 2013-04-07 ENCOUNTER — Ambulatory Visit: Payer: Medicare HMO

## 2013-04-07 NOTE — Telephone Encounter (Signed)
, °

## 2013-04-08 ENCOUNTER — Other Ambulatory Visit: Payer: Medicare HMO

## 2013-04-08 ENCOUNTER — Telehealth: Payer: Self-pay | Admitting: Medical Oncology

## 2013-04-08 NOTE — Telephone Encounter (Signed)
Wife stated Samuel Hurley does not want to come in today for type and cross or blood on sat. He will wait and come in to next appointment on 3/26

## 2013-04-14 ENCOUNTER — Other Ambulatory Visit: Payer: Self-pay | Admitting: *Deleted

## 2013-04-14 ENCOUNTER — Other Ambulatory Visit (HOSPITAL_BASED_OUTPATIENT_CLINIC_OR_DEPARTMENT_OTHER): Payer: Commercial Managed Care - HMO

## 2013-04-14 ENCOUNTER — Telehealth: Payer: Self-pay | Admitting: Internal Medicine

## 2013-04-14 ENCOUNTER — Other Ambulatory Visit: Payer: Medicare HMO

## 2013-04-14 ENCOUNTER — Ambulatory Visit (HOSPITAL_BASED_OUTPATIENT_CLINIC_OR_DEPARTMENT_OTHER): Payer: Commercial Managed Care - HMO | Admitting: Physician Assistant

## 2013-04-14 ENCOUNTER — Telehealth: Payer: Self-pay

## 2013-04-14 ENCOUNTER — Ambulatory Visit (HOSPITAL_BASED_OUTPATIENT_CLINIC_OR_DEPARTMENT_OTHER): Payer: Commercial Managed Care - HMO

## 2013-04-14 ENCOUNTER — Ambulatory Visit: Payer: Commercial Managed Care - HMO | Admitting: Nutrition

## 2013-04-14 ENCOUNTER — Ambulatory Visit: Payer: Commercial Managed Care - HMO

## 2013-04-14 ENCOUNTER — Ambulatory Visit (HOSPITAL_COMMUNITY)
Admission: RE | Admit: 2013-04-14 | Discharge: 2013-04-14 | Disposition: A | Discharge status: Home / Self Care | Payer: Medicare HMO | Source: Ambulatory Visit | Attending: Internal Medicine | Admitting: Internal Medicine

## 2013-04-14 ENCOUNTER — Encounter: Payer: Self-pay | Admitting: Physician Assistant

## 2013-04-14 VITALS — BP 125/60 | HR 83 | Temp 97.3°F | Resp 16 | Ht 72.0 in | Wt 162.3 lb

## 2013-04-14 VITALS — BP 113/48 | HR 90 | Temp 98.0°F

## 2013-04-14 VITALS — BP 119/66 | HR 76 | Temp 97.7°F | Resp 17

## 2013-04-14 DIAGNOSIS — D649 Anemia, unspecified: Secondary | ICD-10-CM

## 2013-04-14 DIAGNOSIS — F3289 Other specified depressive episodes: Secondary | ICD-10-CM

## 2013-04-14 DIAGNOSIS — C342 Malignant neoplasm of middle lobe, bronchus or lung: Secondary | ICD-10-CM

## 2013-04-14 DIAGNOSIS — Z95828 Presence of other vascular implants and grafts: Secondary | ICD-10-CM

## 2013-04-14 DIAGNOSIS — R112 Nausea with vomiting, unspecified: Secondary | ICD-10-CM

## 2013-04-14 DIAGNOSIS — R630 Anorexia: Secondary | ICD-10-CM

## 2013-04-14 DIAGNOSIS — F329 Major depressive disorder, single episode, unspecified: Secondary | ICD-10-CM

## 2013-04-14 DIAGNOSIS — T451X5A Adverse effect of antineoplastic and immunosuppressive drugs, initial encounter: Secondary | ICD-10-CM

## 2013-04-14 DIAGNOSIS — C3490 Malignant neoplasm of unspecified part of unspecified bronchus or lung: Secondary | ICD-10-CM

## 2013-04-14 DIAGNOSIS — D6481 Anemia due to antineoplastic chemotherapy: Secondary | ICD-10-CM

## 2013-04-14 DIAGNOSIS — C384 Malignant neoplasm of pleura: Secondary | ICD-10-CM

## 2013-04-14 DIAGNOSIS — R1310 Dysphagia, unspecified: Secondary | ICD-10-CM

## 2013-04-14 DIAGNOSIS — C3491 Malignant neoplasm of unspecified part of right bronchus or lung: Secondary | ICD-10-CM

## 2013-04-14 LAB — CBC WITH DIFFERENTIAL/PLATELET
BASO%: 0.4 % (ref 0.0–2.0)
BASOS ABS: 0 10*3/uL (ref 0.0–0.1)
EOS%: 0.2 % (ref 0.0–7.0)
Eosinophils Absolute: 0 10*3/uL (ref 0.0–0.5)
HEMATOCRIT: 23 % — AB (ref 38.4–49.9)
HGB: 7.4 g/dL — ABNORMAL LOW (ref 13.0–17.1)
LYMPH#: 0.5 10*3/uL — AB (ref 0.9–3.3)
LYMPH%: 6.5 % — ABNORMAL LOW (ref 14.0–49.0)
MCH: 28.2 pg (ref 27.2–33.4)
MCHC: 32.1 g/dL (ref 32.0–36.0)
MCV: 87.7 fL (ref 79.3–98.0)
MONO#: 1.3 10*3/uL — ABNORMAL HIGH (ref 0.1–0.9)
MONO%: 16.1 % — AB (ref 0.0–14.0)
NEUT#: 6 10*3/uL (ref 1.5–6.5)
NEUT%: 76.8 % — ABNORMAL HIGH (ref 39.0–75.0)
Platelets: 268 10*3/uL (ref 140–400)
RBC: 2.63 10*6/uL — ABNORMAL LOW (ref 4.20–5.82)
RDW: 17.9 % — ABNORMAL HIGH (ref 11.0–14.6)
WBC: 7.9 10*3/uL (ref 4.0–10.3)

## 2013-04-14 LAB — COMPREHENSIVE METABOLIC PANEL (CC13)
ALT: 28 U/L (ref 0–55)
AST: 29 U/L (ref 5–34)
Albumin: 1.9 g/dL — ABNORMAL LOW (ref 3.5–5.0)
Alkaline Phosphatase: 132 U/L (ref 40–150)
Anion Gap: 11 mEq/L (ref 3–11)
BUN: 17.5 mg/dL (ref 7.0–26.0)
CALCIUM: 9 mg/dL (ref 8.4–10.4)
CHLORIDE: 100 meq/L (ref 98–109)
CO2: 27 mEq/L (ref 22–29)
CREATININE: 0.7 mg/dL (ref 0.7–1.3)
Glucose: 147 mg/dl — ABNORMAL HIGH (ref 70–140)
Potassium: 4.3 mEq/L (ref 3.5–5.1)
Sodium: 138 mEq/L (ref 136–145)
Total Bilirubin: 0.2 mg/dL (ref 0.20–1.20)
Total Protein: 6.9 g/dL (ref 6.4–8.3)

## 2013-04-14 LAB — HOLD TUBE, BLOOD BANK

## 2013-04-14 LAB — PREPARE RBC (CROSSMATCH)

## 2013-04-14 MED ORDER — SODIUM CHLORIDE 0.9 % IJ SOLN
10.0000 mL | Freq: Once | INTRAMUSCULAR | Status: AC
  Administered 2013-04-14: 10 mL via INTRAVENOUS
  Filled 2013-04-14: qty 10

## 2013-04-14 MED ORDER — DIPHENHYDRAMINE HCL 25 MG PO CAPS
25.0000 mg | ORAL_CAPSULE | Freq: Once | ORAL | Status: AC
  Administered 2013-04-14: 25 mg via ORAL

## 2013-04-14 MED ORDER — METHYLPREDNISOLONE 4 MG PO KIT: PACK | ORAL | Status: DC

## 2013-04-14 MED ORDER — DIPHENHYDRAMINE HCL 25 MG PO CAPS
ORAL_CAPSULE | ORAL | Status: AC
  Filled 2013-04-14: qty 1

## 2013-04-14 MED ORDER — ACETAMINOPHEN 325 MG PO TABS
ORAL_TABLET | ORAL | Status: AC
  Filled 2013-04-14: qty 2

## 2013-04-14 MED ORDER — ACETAMINOPHEN 325 MG PO TABS
650.0000 mg | ORAL_TABLET | Freq: Once | ORAL | Status: AC
  Administered 2013-04-14: 650 mg via ORAL

## 2013-04-14 MED ORDER — MIRTAZAPINE 30 MG PO TABS: 30.0000 mg | ORAL_TABLET | Freq: Every day | ORAL | Status: DC

## 2013-04-14 MED ORDER — SODIUM CHLORIDE 0.9 % IJ SOLN
10.0000 mL | INTRAMUSCULAR | Status: DC | PRN
  Administered 2013-04-14: 10 mL via INTRAVENOUS
  Filled 2013-04-14: qty 10

## 2013-04-14 MED ORDER — HEPARIN SOD (PORK) LOCK FLUSH 100 UNIT/ML IV SOLN
500.0000 [IU] | Freq: Once | INTRAVENOUS | Status: AC
  Administered 2013-04-14: 500 [IU] via INTRAVENOUS
  Filled 2013-04-14: qty 5

## 2013-04-14 MED ORDER — LORAZEPAM 1 MG PO TABS: 1.0000 mg | ORAL_TABLET | Freq: Three times a day (TID) | ORAL | Status: DC

## 2013-04-14 NOTE — Telephone Encounter (Signed)
gv adn printed appt sched and avs for pt for April  °

## 2013-04-14 NOTE — Patient Instructions (Signed)
[facility] Discharge Instructions for Patients Receiving Chemotherapy  Today you received the following chemotherapy agents Gemzar To help prevent nausea and vomiting after your treatment, we encourage you to take your nausea medication as prescribed.   If you develop nausea and vomiting that is not controlled by your nausea medication, call the clinic.   BELOW ARE SYMPTOMS THAT SHOULD BE REPORTED IMMEDIATELY:  *FEVER GREATER THAN 100.5 F  *CHILLS WITH OR WITHOUT FEVER  NAUSEA AND VOMITING THAT IS NOT CONTROLLED WITH YOUR NAUSEA MEDICATION  *UNUSUAL SHORTNESS OF BREATH  *UNUSUAL BRUISING OR BLEEDING  TENDERNESS IN MOUTH AND THROAT WITH OR WITHOUT PRESENCE OF ULCERS  *URINARY PROBLEMS  *BOWEL PROBLEMS  UNUSUAL RASH Items with * indicate a potential emergency and should be followed up as soon as possible.  Feel free to call the clinic you have any questions or concerns. The clinic phone number is [phone].

## 2013-04-14 NOTE — Progress Notes (Signed)
79 year old male diagnosed with non-small cell lung cancer.  He is a patient of Dr. Earlie Server.  Past medical history includes hyperlipidemia, OSA, BPH, xerostomia, pharyngeal cancer, diverticulosis, GERD, and radiation, chemotherapy.  Medications include Synthroid, Ativan, Zofran, and Compazine.  Include sodium 131 and glucose 111 on March 17.  Height: 6 feet, 0 inches. Weight: 162.2, March 17. Usual body weight: 200 pounds October 2014. BMI: 22.  Estimated nutrition needs: 2100-2300 calories, 100-120 g protein, 2.2 L fluid.  Nutrition diagnosis: Unintended weight loss related to inadequate oral intake evidenced by 19% weight loss from usual body weight.  Intervention: I attempted to speak with patient in the chemotherapy room.  Patient is confused and was unable to provide history.  I attempted to find patient's wife however, she was not available either. The patient has had a PEG feeding tube placed.  Patient has an order to receive Jevity 1.2.  Goal rate of 2 cans 4 times a day, 200 ml free water every 6 hours.  Tube feedings will provide 2160 calories and 100 grams protein.This meets estimated nutrition needs.  No change in tube feedings warranted at this time.   Monitoring, evaluation, goals: Patient will tolerate tube feeding.  At goal rate to minimize weight loss.  Next visit: I am available to assist with patient's tube feeding as needed.

## 2013-04-14 NOTE — Telephone Encounter (Signed)
[place] for PT as requested.

## 2013-04-14 NOTE — Telephone Encounter (Addendum)
Phone call from [facility] with [place] states she went out and evaluated patient yesterday and is requesting to see him for PT for strength, balance, and endurance 2 times a week for 1 week then again for 2 times a week for another week. Please advised.

## 2013-04-14 NOTE — Progress Notes (Addendum)
[place] [phone]   Fax:([phone]) [place] NOTE  Samuel Hare, MD [address] [place] [address]  DIAGNOSIS AND STAGE:  1) Poorly differentiated non-small cell lung cancer diagnosed in December of 2014 2) Extensive stage small cell lung cancer diagnosed in July 2014.  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 4)  History of oncocytic renal tumor status post radiofrequency ablation at [facility] in August of 2008.  PRIOR THERAPY: Systemic chemotherapy with carboplatin for AUC of 5 on day 1 and etoposide 120 mg/M2 on days 1, 2 and 3 with Neulasta support on day 4, status post 4 cycles. First dose was given on 08/18/2012.    CURRENT THERAPY: Systemic chemotherapy with carboplatin for AUC of 5 on day 1 and Abraxane 100 mg/M2 on days 1, 8 and 15 every 3 weeks. Status post 2 cycles.   INTERVAL HISTORY: Samuel Hurley 79 y.o. male returns to the clinic today for followup visit accompanied by his wife Samuel Hurley.    He  underwent biopsy of one of the right pleural-based nodule at [facility] and the final pathology was consistent with poorly differentiated non-small cell carcinoma. The tumor cells are of small size with hyperchromatic nuclei and some molded forms. Many of the cells have abundant cytoplasm and nuclei with prominent nucleoli. Strong immunoreactivity is observed for anti-keratins and CK5/6, with negative staining observed for calretinin, WT-1, CK7, desmin, TTF-1 , Fli-1, CD99 and CD56. The entirety of these findings is most consistent with non-small carcinoma, with immunohistochemical evidence of rudimentary squamous differentiation. Bright pankeratin/ CK5/6 immunoreactivity with negative staining  for TTF-1 and CD56 is not typical of small cell carcinoma.  He was seen by Dr. Sharlet Salina who sent him to be treated locally for the recently diagnosed non-small cell carcinoma as the patient is not a candidate for the clinical trial with Ipilumomab and Nivolumab for the second line option for the previously diagnosed small cell lung cancer. The patient was also seen recently at [facility] by Dr. Lewanda Rife who discussed palliative systemic chemotherapy for his condition. The patient is not accepting the fact that he has an incurable condition and he still shopping around for other treatment options. He also requested a referral to the [facility]. He came today for evaluation and discussion of his treatment options after the recent diagnosis of non-small cell carcinoma.   The molecular biomarkers performed at [facility] were negative for EGFR mutation and the ALK gene translocation.   He returns today for a followup visit. He was recently hospitalized for dehydration, malnutrition and generalized weakness with frequent fall falls.he is due to receive cycle #3 of systemic chemotherapy today with carboplatin and Abraxane. He states that he is not up to proceeding with chemotherapy. He continues despite having had a PEG tube placed during this most recent hospitalization to complain of decreased appetite. He is also having some episodes of nausea and vomiting. He states that he gets choked with swallowing some of his pills. Overall his wife feels as though he's stronger than when he was admitted. The patient and his wife both note that he is more depressed. He is been on Zoloft 100 mg by mouth for the past few months  and this does not seem to be addressing his level of depression. He denied suicidal ideations.  MEDICAL HISTORY: Past Medical History  Diagnosis Date  . Hyperlipidemia   . OSA (obstructive sleep apnea)   . BPH (benign prostatic hypertrophy)   . Kidney disease      TCC  . Xerostomia   . Pharynx cancer     squamous cell stage 4,s/p XRT,chemo, neck dissection  . Colon polyp     HYPERPLASTIC & TUBULAR ADENOMA(Colonoscopy-Dr.Pike)  . Diverticulosis of colon (without mention of hemorrhage)     (Colonoscopy-Dr.Lockhart)  . Internal hemorrhoids without mention of complication     (Colonoscopy-Dr.Edwards)  . GERD (gastroesophageal reflux disease)     (EGD-Dr. Velora Heckler)  . Atrophic gastritis without mention of hemorrhage     (EGD-Dr. Velora Heckler)  . History of radiation therapy   . Small cell lung cancer   . Non-small cell lung cancer     ALLERGIES:  is allergic to erythromycin and tetracycline.  MEDICATIONS:  Current Outpatient Prescriptions  Medication Sig Dispense Refill  . acetaminophen (TYLENOL) 500 MG tablet Take 500 mg by mouth every 4 (four) hours as needed (Pain).       Marland Kitchen antiseptic oral rinse (BIOTENE) LIQD 15 mLs by Mouth Rinse route as needed for dry mouth.      . docusate sodium 100 MG CAPS Take 200 mg by mouth 2 (two) times daily.  10 capsule  0  . finasteride (PROSCAR) 5 MG tablet Take 5 mg by mouth at bedtime.       Marland Kitchen levothyroxine (SYNTHROID, LEVOTHROID) 75 MCG tablet Take 75 mcg by mouth daily before breakfast.       . LORazepam (ATIVAN) 1 MG tablet Take 1 tablet (1 mg total) by mouth every 8 (eight) hours.  30 tablet  0  . mometasone (NASONEX) 50 MCG/ACT nasal spray Place 2 sprays into the nose daily.      . Nutritional Supplements (FEEDING SUPPLEMENT, JEVITY 1.2 CAL,) LIQD Place 1,000 mLs into feeding tube every 6 (six) hours.  1000 mL  0  . ondansetron (ZOFRAN) 4 MG tablet Take 1 tablet (4 mg total) by mouth every 8 (eight) hours as needed for nausea or vomiting.  90 tablet  2  . oxyCODONE (OXY IR/ROXICODONE) 5 MG immediate release tablet Take 1 tablet (5 mg total) by mouth every 4 (four) hours as needed for moderate pain or severe pain.  30 tablet  0  . sertraline (ZOLOFT) 100 MG tablet Take 1 tablet (100 mg total) by mouth daily.   30 tablet  prn  . fentaNYL (DURAGESIC - DOSED MCG/HR) 50 MCG/HR Place 1 patch (50 mcg total) onto the skin every 3 (three) days.  10 patch  0  . HYDROcodone-acetaminophen (NORCO/VICODIN) 5-325 MG per tablet Take 1-2 tablets by mouth every 4 (four) hours as needed for moderate pain.  30 tablet  0  . methylPREDNISolone (MEDROL DOSEPAK) 4 MG tablet follow package directions  21 tablet  0  . mirtazapine (REMERON) 30 MG tablet Take 1 tablet (30 mg total) by mouth at bedtime.  30 tablet  0  . prochlorperazine (COMPAZINE) 10 MG tablet Take 10 mg by mouth every 6 (six) hours as needed for nausea or vomiting.      . terazosin (HYTRIN) 1 MG capsule Take 1 mg by mouth 2 (two) times daily.        No current facility-administered medications for this visit.   Facility-Administered Medications Ordered in Other Visits  Medication  Dose Route Frequency Provider Last Rate Last Dose  . acetaminophen (TYLENOL) tablet 650 mg  650 mg Oral Once [employer], PA-C      . diphenhydrAMINE (BENADRYL) capsule 25 mg  25 mg Oral Once Carlton Adam, PA-C        SURGICAL HISTORY:  Past Surgical History  Procedure Laterality Date  . Appendectomy    . Lumbar laminectomy    . Tonsillectomy    . Bilateral ganglionectomies    . Radical right neck dissection  2008  . Pci-rfa renal cell (tcc) cancer  2009    pt denies  . Cholecystectomy  dec. 2009  . Septoplasty  03/01/12    with double turbinectomy  . Video bronchoscopy Bilateral 08/10/2012    Procedure: VIDEO BRONCHOSCOPY WITH FLUORO;  Surgeon: Rigoberto Noel, MD;  Location: [facility];  Service: Cardiopulmonary;  Laterality: Bilateral;  . Shoulder arthroscopy Left 2013  . Thoracentesis Right 10/2012    REVIEW OF SYSTEMS:  Constitutional: positive for anorexia, fatigue and malaise Eyes: negative Ears, nose, mouth, throat, and face: positive for Discomfort with swallowing, sometimes getting choked on some of his medications Respiratory: positive for dyspnea on  exertion and pleurisy/chest pain Cardiovascular: negative Gastrointestinal: positive for dysphagia, nausea and vomiting Genitourinary:negative Integument/breast: negative Hematologic/lymphatic: negative Musculoskeletal:positive for back pain Neurological: negative Behavioral/Psych: positive for anxiety, bad mood, depression, irritability and mood swings Endocrine: negative Allergic/Immunologic: negative   PHYSICAL EXAMINATION: General appearance: alert, cooperative, appears older than stated age, fatigued and no distress Head: Normocephalic, without obvious abnormality, atraumatic Neck: no adenopathy and supple, symmetrical, trachea midline Lymph nodes: Cervical, supraclavicular, and axillary nodes normal. Resp: clear to auscultation bilaterally Back: symmetric, no curvature. ROM normal. No CVA tenderness. Cardio: regular rate and rhythm, S1, S2 normal, no murmur, click, rub or gallop GI: soft, non-tender; bowel sounds normal; no masses,  no organomegaly Extremities: extremities normal, atraumatic, no cyanosis or edema Neurologic: Alert and oriented X 3, normal strength and tone. Normal symmetric reflexes. Normal coordination and gait   ECOG PERFORMANCE STATUS: 2 - Symptomatic, <50% confined to bed  Blood pressure 125/60, pulse 83, temperature 97.3 F (36.3 C), temperature source Oral, resp. rate 16, height 6' (1.829 m), weight 162 lb 4.8 oz (73.619 kg).  LABORATORY DATA: Lab Results  Component Value Date   WBC 7.9 04/14/2013   HGB 7.4* 04/14/2013   HCT 23.0* 04/14/2013   MCV 87.7 04/14/2013   PLT 268 04/14/2013      Chemistry      Component Value Date/Time   NA 138 04/14/2013 1057   NA 131* 04/05/2013 0545   K 4.3 04/14/2013 1057   K 3.8 04/05/2013 0545   CL 93* 04/05/2013 0545   CO2 27 04/14/2013 1057   CO2 27 04/05/2013 0545   BUN 17.5 04/14/2013 1057   BUN 8 04/05/2013 0545   CREATININE 0.7 04/14/2013 1057   CREATININE 0.79 04/05/2013 0545      Component Value Date/Time    CALCIUM 9.0 04/14/2013 1057   CALCIUM 8.3* 04/05/2013 0545   ALKPHOS 132 04/14/2013 1057   ALKPHOS 76 05/13/2012 1005   AST 29 04/14/2013 1057   AST 16 05/13/2012 1005   ALT 28 04/14/2013 1057   ALT 11 05/13/2012 1005   BILITOT 0.20 04/14/2013 1057   BILITOT 0.5 05/13/2012 1005       RADIOGRAPHIC STUDIES:  ASSESSMENT AND PLAN: This is another very extensive and long visit with the patient and his wife today. The patient is a  32 years old white male who was diagnosed with extensive stage small cell lung cancer status post systemic chemotherapy with carboplatin and etoposide with progressive disease on the right side of the chest. He was recently seen at [facility] by Dr. Sharlet Salina and was considered for a clinical trial with Ipilumomab and Nivolumab.  Recent repeat biopsy of a right pleural based nodule showed poorly differentiated non-small cell carcinoma. He is currently undergoing systemic chemotherapy with carboplatin and Abraxane status post 2 cycles.  The patient was discussed with and also seen by Dr. Julien Nordmann. Patient is status post PEG tube placement in per his wife is now receiving 6 cans of nourishment as recommended by the hospital dietitian. His overall performance status has diminished and he is not further chemotherapy at this time. We will plan to do restaging CT scan of his chest, abdomen and pelvis in the next week and have her followup with Dr. Julien Nordmann within 2 weeks to discuss the results of the scans. Regarding his depression we will wean him off the Zoloft and have him take 50 mg by mouth the next 4 days and then start him on Remeron 30 mg by mouth at bedtime. To help stimulate his appetite will also place him on a Medrol Dosepak. A prescription to the Remeron and the Medrol Dosepak percent of the patient's pharmacy of record via E. scribed. He is significantly anemic with a hemoglobin of 7.4 g/dL, we will arrange to transfuse him 2 units of packed red  blood cells today to address this anemia.  He was advised to call immediately if he has any concerning symptoms in the interval. The patient voices understanding of current disease status and treatment options and is in agreement with the current care plan.  All questions were answered. The patient knows to call the clinic with any problems, questions or concerns. We can certainly see the patient much sooner if necessary.  Carlton Adam, PA-C 04/14/2013  ADDENDUM:  Hematology/Oncology Attending:  I had a face to face encounter with the patient. I recommended his care plan. This is a very pleasant 79 years old white male with recent diagnosis of 2 lung cancer including extensive stage small cell lung cancer diagnosed in July 2014 status post 4 cycles of systemic chemotherapy with carboplatin and etoposide then in December of 2014 he was diagnosed with poorly differentiated non-small cell lung cancer and currently undergoing systemic chemotherapy with carboplatin and Abraxane status post 2 cycles. The patient has been complaining of increasing fatigue and weakness as well as lack of appetite and dysphagia secondary to prior history of head and neck cancer. He was recently admitted to [facility] for treatment of dehydration and failure to thrive. During his admission he underwent PEG tube placement and he is currently receiving supplemental nutrition through the PEG tube. He is able to maintain his weight well.  The patient is too weak today to resume systemic chemotherapy. I will keep his treatment on hold for now. I would repeat CT scan of the chest, abdomen and pelvis for restaging of his disease before resuming her systemic therapy and if no improvement the patient may be considered for palliative care and hospice referral. For the chemotherapy-induced anemia, I will arrange for the patient to receive 2 units of PRBCs transfusion. He would come back for follow up visit in one week for  evaluation and discussion of his scan results. He was advised to call immediately if he has any concerning  symptoms in the interval.  Disclaimer: This note was dictated with voice recognition software. Similar sounding words can inadvertently be transcribed and may not be corrected upon review. Eilleen Kempf., MD 04/16/2013

## 2013-04-14 NOTE — Telephone Encounter (Signed)
Samuel Hurley has been advised.

## 2013-04-15 ENCOUNTER — Other Ambulatory Visit: Payer: Commercial Managed Care - HMO

## 2013-04-15 LAB — TYPE AND SCREEN
ABO/RH(D): A POS
Antibody Screen: NEGATIVE
Unit division: 0
Unit division: 0

## 2013-04-15 NOTE — Patient Instructions (Signed)
You will receive 2 units of blood to address your anemia We are changing your antidepressant from Zoloft to Remeron. He will take 50 mg of Zoloft daily for the next 4 days and then discontinue it altogether and then start the Remeron 30 mg by mouth at bedtime You'll be scheduled for restaging CT scan within the next week and will have a followup appointment with Dr. Julien Nordmann within the next 2 weeks to discuss the results of the CT scans to reevaluate your disease.

## 2013-04-18 ENCOUNTER — Other Ambulatory Visit: Payer: Self-pay | Admitting: *Deleted

## 2013-04-18 DIAGNOSIS — C3490 Malignant neoplasm of unspecified part of unspecified bronchus or lung: Secondary | ICD-10-CM

## 2013-04-18 NOTE — Progress Notes (Signed)
Pt's wife called wanting to see if pt could have a blood transfusion and albumin transfusion this week.  He had a bl transfusion on on 3/26 for hbg 7.6.  Per dr Vista Mink, no need for albumin transfusion since he is now getting dietary supplementation via PEG tube.  We can check CBC and do a type and hold early this week to see if he does need blood.  Per dr Vista Mink, if Hbg >  8.0, a transfusion is not indicated.  [name] verbalized understanding.  SLJ

## 2013-04-19 ENCOUNTER — Ambulatory Visit (HOSPITAL_BASED_OUTPATIENT_CLINIC_OR_DEPARTMENT_OTHER): Payer: Commercial Managed Care - HMO

## 2013-04-19 ENCOUNTER — Other Ambulatory Visit (HOSPITAL_BASED_OUTPATIENT_CLINIC_OR_DEPARTMENT_OTHER): Payer: Commercial Managed Care - HMO

## 2013-04-19 VITALS — BP 118/66 | HR 90 | Temp 97.4°F

## 2013-04-19 DIAGNOSIS — C384 Malignant neoplasm of pleura: Secondary | ICD-10-CM

## 2013-04-19 DIAGNOSIS — C342 Malignant neoplasm of middle lobe, bronchus or lung: Secondary | ICD-10-CM

## 2013-04-19 DIAGNOSIS — Z95828 Presence of other vascular implants and grafts: Secondary | ICD-10-CM

## 2013-04-19 DIAGNOSIS — C3490 Malignant neoplasm of unspecified part of unspecified bronchus or lung: Secondary | ICD-10-CM

## 2013-04-19 LAB — CBC WITH DIFFERENTIAL/PLATELET
BASO%: 0.5 % (ref 0.0–2.0)
BASOS ABS: 0 10*3/uL (ref 0.0–0.1)
EOS%: 0.3 % (ref 0.0–7.0)
Eosinophils Absolute: 0 10*3/uL (ref 0.0–0.5)
HCT: 31.4 % — ABNORMAL LOW (ref 38.4–49.9)
HGB: 10.3 g/dL — ABNORMAL LOW (ref 13.0–17.1)
LYMPH#: 0.7 10*3/uL — AB (ref 0.9–3.3)
LYMPH%: 7.2 % — ABNORMAL LOW (ref 14.0–49.0)
MCH: 29.1 pg (ref 27.2–33.4)
MCHC: 32.9 g/dL (ref 32.0–36.0)
MCV: 88.2 fL (ref 79.3–98.0)
MONO#: 1.3 10*3/uL — AB (ref 0.1–0.9)
MONO%: 13.3 % (ref 0.0–14.0)
NEUT%: 78.7 % — ABNORMAL HIGH (ref 39.0–75.0)
NEUTROS ABS: 7.9 10*3/uL — AB (ref 1.5–6.5)
Platelets: 398 10*3/uL (ref 140–400)
RBC: 3.56 10*6/uL — AB (ref 4.20–5.82)
RDW: 16.5 % — AB (ref 11.0–14.6)
WBC: 10.1 10*3/uL (ref 4.0–10.3)

## 2013-04-19 LAB — COMPREHENSIVE METABOLIC PANEL (CC13)
ALBUMIN: 2.2 g/dL — AB (ref 3.5–5.0)
ALT: 64 U/L — AB (ref 0–55)
ANION GAP: 10 meq/L (ref 3–11)
AST: 46 U/L — ABNORMAL HIGH (ref 5–34)
Alkaline Phosphatase: 137 U/L (ref 40–150)
BILIRUBIN TOTAL: 0.28 mg/dL (ref 0.20–1.20)
BUN: 28 mg/dL — ABNORMAL HIGH (ref 7.0–26.0)
CALCIUM: 9 mg/dL (ref 8.4–10.4)
CHLORIDE: 100 meq/L (ref 98–109)
CO2: 29 mEq/L (ref 22–29)
Creatinine: 0.8 mg/dL (ref 0.7–1.3)
GLUCOSE: 152 mg/dL — AB (ref 70–140)
POTASSIUM: 4.1 meq/L (ref 3.5–5.1)
Sodium: 139 mEq/L (ref 136–145)
TOTAL PROTEIN: 7 g/dL (ref 6.4–8.3)

## 2013-04-19 LAB — HOLD TUBE, BLOOD BANK

## 2013-04-19 MED ORDER — SODIUM CHLORIDE 0.9 % IJ SOLN
10.0000 mL | INTRAMUSCULAR | Status: DC | PRN
  Administered 2013-04-19: 10 mL via INTRAVENOUS
  Filled 2013-04-19: qty 10

## 2013-04-19 MED ORDER — HEPARIN SOD (PORK) LOCK FLUSH 100 UNIT/ML IV SOLN
500.0000 [IU] | Freq: Once | INTRAVENOUS | Status: AC
  Administered 2013-04-19: 500 [IU] via INTRAVENOUS
  Filled 2013-04-19: qty 5

## 2013-04-19 NOTE — Patient Instructions (Signed)

## 2013-04-21 ENCOUNTER — Telehealth: Payer: Self-pay | Admitting: *Deleted

## 2013-04-21 ENCOUNTER — Ambulatory Visit: Payer: Medicare HMO

## 2013-04-21 ENCOUNTER — Other Ambulatory Visit: Payer: Medicare HMO

## 2013-04-21 ENCOUNTER — Encounter: Payer: Self-pay | Admitting: *Deleted

## 2013-04-21 ENCOUNTER — Ambulatory Visit (HOSPITAL_COMMUNITY)
Admission: RE | Admit: 2013-04-21 | Discharge: 2013-04-21 | Disposition: A | Discharge status: Home / Self Care | Payer: Medicare HMO | Source: Ambulatory Visit | Attending: Physician Assistant | Admitting: Physician Assistant

## 2013-04-21 ENCOUNTER — Encounter (HOSPITAL_COMMUNITY): Payer: Self-pay

## 2013-04-21 DIAGNOSIS — D649 Anemia, unspecified: Secondary | ICD-10-CM

## 2013-04-21 DIAGNOSIS — R911 Solitary pulmonary nodule: Secondary | ICD-10-CM | POA: Insufficient documentation

## 2013-04-21 DIAGNOSIS — C3490 Malignant neoplasm of unspecified part of unspecified bronchus or lung: Secondary | ICD-10-CM | POA: Diagnosis present

## 2013-04-21 DIAGNOSIS — N400 Enlarged prostate without lower urinary tract symptoms: Secondary | ICD-10-CM | POA: Insufficient documentation

## 2013-04-21 DIAGNOSIS — J9819 Other pulmonary collapse: Secondary | ICD-10-CM | POA: Diagnosis not present

## 2013-04-21 MED ORDER — IOHEXOL 300 MG/ML  SOLN
100.0000 mL | Freq: Once | INTRAMUSCULAR | Status: AC | PRN
  Administered 2013-04-21: 100 mL via INTRAVENOUS

## 2013-04-21 NOTE — Telephone Encounter (Signed)
Samuel Hurley called stating that pt will be getting CT scan today but he is not going to get chemo.  They want to wait until they get the results of the CT scan and see Dr Vista Mink before proceeding with any more chemotherapy.  SLJ

## 2013-04-21 NOTE — Progress Notes (Signed)
Lab work done on 04/19/13.  Labs (CBC and CMET) reviewed by Dr Julien Nordmann.  Per Dr Vista Mink, pt does not need a CBC and CMET done day of treatment on 04/21/13, may use labs from 04/19/13.  SLJ

## 2013-04-22 ENCOUNTER — Other Ambulatory Visit: Payer: Medicare HMO

## 2013-04-22 ENCOUNTER — Telehealth: Payer: Self-pay | Admitting: *Deleted

## 2013-04-22 ENCOUNTER — Other Ambulatory Visit: Payer: Self-pay | Admitting: *Deleted

## 2013-04-22 NOTE — Telephone Encounter (Signed)
Enteral Physician Order faxed to [facility] infusion services [phone].  SLJ

## 2013-04-25 ENCOUNTER — Other Ambulatory Visit (HOSPITAL_BASED_OUTPATIENT_CLINIC_OR_DEPARTMENT_OTHER): Payer: Commercial Managed Care - HMO

## 2013-04-25 ENCOUNTER — Ambulatory Visit (HOSPITAL_BASED_OUTPATIENT_CLINIC_OR_DEPARTMENT_OTHER): Payer: Commercial Managed Care - HMO | Admitting: Internal Medicine

## 2013-04-25 ENCOUNTER — Telehealth: Payer: Self-pay | Admitting: Internal Medicine

## 2013-04-25 ENCOUNTER — Encounter: Payer: Self-pay | Admitting: Internal Medicine

## 2013-04-25 ENCOUNTER — Ambulatory Visit (HOSPITAL_BASED_OUTPATIENT_CLINIC_OR_DEPARTMENT_OTHER): Payer: Commercial Managed Care - HMO

## 2013-04-25 VITALS — BP 129/57 | HR 78 | Temp 98.7°F | Resp 18 | Ht 72.0 in | Wt 167.6 lb

## 2013-04-25 DIAGNOSIS — Z452 Encounter for adjustment and management of vascular access device: Secondary | ICD-10-CM

## 2013-04-25 DIAGNOSIS — C342 Malignant neoplasm of middle lobe, bronchus or lung: Secondary | ICD-10-CM

## 2013-04-25 DIAGNOSIS — C3491 Malignant neoplasm of unspecified part of right bronchus or lung: Secondary | ICD-10-CM

## 2013-04-25 DIAGNOSIS — Z95828 Presence of other vascular implants and grafts: Secondary | ICD-10-CM

## 2013-04-25 DIAGNOSIS — C3490 Malignant neoplasm of unspecified part of unspecified bronchus or lung: Secondary | ICD-10-CM

## 2013-04-25 DIAGNOSIS — C782 Secondary malignant neoplasm of pleura: Secondary | ICD-10-CM

## 2013-04-25 LAB — COMPREHENSIVE METABOLIC PANEL (CC13)
ALK PHOS: 97 U/L (ref 40–150)
ALT: 22 U/L (ref 0–55)
AST: 15 U/L (ref 5–34)
Albumin: 2.1 g/dL — ABNORMAL LOW (ref 3.5–5.0)
Anion Gap: 9 mEq/L (ref 3–11)
BILIRUBIN TOTAL: 0.22 mg/dL (ref 0.20–1.20)
BUN: 26 mg/dL (ref 7.0–26.0)
CO2: 27 mEq/L (ref 22–29)
CREATININE: 0.9 mg/dL (ref 0.7–1.3)
Calcium: 8.5 mg/dL (ref 8.4–10.4)
Chloride: 106 mEq/L (ref 98–109)
Glucose: 85 mg/dl (ref 70–140)
Potassium: 3.9 mEq/L (ref 3.5–5.1)
Sodium: 142 mEq/L (ref 136–145)
Total Protein: 6.5 g/dL (ref 6.4–8.3)

## 2013-04-25 LAB — CBC WITH DIFFERENTIAL/PLATELET
BASO%: 0.5 % (ref 0.0–2.0)
BASOS ABS: 0 10*3/uL (ref 0.0–0.1)
EOS%: 0.9 % (ref 0.0–7.0)
Eosinophils Absolute: 0.1 10*3/uL (ref 0.0–0.5)
HEMATOCRIT: 27.3 % — AB (ref 38.4–49.9)
HGB: 8.7 g/dL — ABNORMAL LOW (ref 13.0–17.1)
LYMPH%: 9.5 % — ABNORMAL LOW (ref 14.0–49.0)
MCH: 28.7 pg (ref 27.2–33.4)
MCHC: 31.8 g/dL — ABNORMAL LOW (ref 32.0–36.0)
MCV: 90.1 fL (ref 79.3–98.0)
MONO#: 1.2 10*3/uL — ABNORMAL HIGH (ref 0.1–0.9)
MONO%: 15.4 % — AB (ref 0.0–14.0)
NEUT#: 5.5 10*3/uL (ref 1.5–6.5)
NEUT%: 73.7 % (ref 39.0–75.0)
Platelets: 312 10*3/uL (ref 140–400)
RBC: 3.03 10*6/uL — ABNORMAL LOW (ref 4.20–5.82)
RDW: 18.7 % — ABNORMAL HIGH (ref 11.0–14.6)
WBC: 7.5 10*3/uL (ref 4.0–10.3)
lymph#: 0.7 10*3/uL — ABNORMAL LOW (ref 0.9–3.3)

## 2013-04-25 MED ORDER — HEPARIN SOD (PORK) LOCK FLUSH 100 UNIT/ML IV SOLN
500.0000 [IU] | Freq: Once | INTRAVENOUS | Status: AC
  Administered 2013-04-25: 500 [IU] via INTRAVENOUS
  Filled 2013-04-25: qty 5

## 2013-04-25 MED ORDER — SODIUM CHLORIDE 0.9 % IJ SOLN
10.0000 mL | INTRAMUSCULAR | Status: DC | PRN
  Administered 2013-04-25: 10 mL via INTRAVENOUS
  Filled 2013-04-25: qty 10

## 2013-04-25 MED ORDER — LORAZEPAM 1 MG PO TABS: 1.0000 mg | ORAL_TABLET | Freq: Three times a day (TID) | ORAL | Status: DC

## 2013-04-25 MED ORDER — OXYCODONE HCL 5 MG PO TABS: 5.0000 mg | ORAL_TABLET | ORAL | Status: DC | PRN

## 2013-04-25 NOTE — Progress Notes (Signed)
[facility] [phone]   [phone]  OFFICE VISIT PROGRESS NOTE  [name], MD [address] [place] [address]  DIAGNOSIS AND STAGE:  1) Poorly differentiated non-small cell lung cancer diagnosed in December of 2014 2) Extensive stage small cell lung cancer diagnosed in July 2014.  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 4)  History of oncocytic renal tumor status post radiofrequency ablation at [facility] in August of 2008.  PRIOR THERAPY: Systemic chemotherapy with carboplatin for AUC of 5 on day 1 and etoposide 120 mg/M2 on days 1, 2 and 3 with Neulasta support on day 4, status post 4 cycles. First dose was given on 08/18/2012.    CURRENT THERAPY: Systemic chemotherapy with carboplatin for AUC of 5 on day 1 and Abraxane 100 mg/M2 on days 1, 8 and 15 every 3 weeks, status post 2 cycles.  INTERVAL HISTORY: Samuel Hurley 79 y.o. male returns to the clinic today for followup visit accompanied by his wife Samuel Hurley.   On 01/29/2013, The patient is feeling much better today with no specific complaints except for the occasional right-sided chest pain and this is well-controlled with his current pain medication with fentanyl patch 50 mcg/hour every 3 days in addition to Percocet on as-needed basis. He takes around 2-3 Percocet on daily basis for breakthrough pain. His shortness of breath is much improved. The patient continues to have drainage from the Pleuryx catheter but this is down to less than 50 cc every 3 days and he is expected to have his Pleurx catheter removed and few weeks by Dr. Kerin Ransom at [facility]. He recently underwent biopsy of one of the right pleural-based nodule at [facility] and the final  pathology was consistent with poorly differentiated non-small cell carcinoma. The tumor cells are of small size with hyperchromatic nuclei and some molded forms. Many of the cells have abundant cytoplasm and nuclei with prominent nucleoli. Strong immunoreactivity is observed for anti-keratins and CK5/6, with negative staining observed for calretinin, WT-1, CK7, desmin, TTF-1 , Fli-1, CD99 and CD56. The entirety of these findings is most consistent with non-small carcinoma, with immunohistochemical evidence of rudimentary squamous differentiation. Bright pankeratin/ CK5/6 immunoreactivity with negative staining for TTF-1 and CD56 is not typical of small cell carcinoma.  He was seen recently by Dr. Sharlet Salina who sent him to be treated locally for the recently diagnosed non-small cell carcinoma as the patient is not a candidate for the clinical trial with Ipilumomab and Nivolumab for the second line option for the previously diagnosed small cell lung cancer. The patient was also seen recently at [facility] by Dr. Lewanda Rife who discussed within palliative systemic chemotherapy for his condition. The patient is not accepting the fact that he has an incurable condition and he still shopping around for other treatment options. He also requested a referral to the [facility]. He came today for evaluation and discussion of his treatment options after the recent diagnosis of non-small cell carcinoma.  On 02/21/2013, the patient present today for evaluation of his condition. He was seen by her medical oncologist in the last few weeks including Dr. Harlow Asa at [facility] in [facility] as well as a Statistician at  [facility] in [place]. The visit records from Dr. Harlow Asa are currently available to me and he is in agreement with the current evaluation of Samuel Hurley regarding dealing with 2 different lung cancer including small cell  lung cancer which was treated with carboplatin and etoposide in addition to recently diagnosed poorly differentiated non-small cell carcinoma. He also concurs with the treatment options provided for Samuel Hurley including treatment with carboplatin and Abraxane. The patient is here today for evaluation and consideration of starting treatment for his newly diagnosed poorly differentiated non-small cell lung cancer. He is feeling much better today with no specific complaints except for mild fatigue. His Pleurx catheter drains less than 75 cc of pleural fluid every 3 days. The molecular biomarkers performed at [facility] were negative for EGFR mutation and the ALK gene translocation.  On 03/17/2013, the patient is here today for evaluation accompanied by his wife. He tolerated the first cycle of his systemic chemotherapy with carboplatin and Abraxane fairly well with no significant adverse effects. He denied having any significant nausea or vomiting, no fever or chills. He lost 1 pound since his last visit. He has good appetite but concerned about dysphagia from his previous head and neck cancer. I discussed with referred to gastroenterology for evaluation and consideration of PEG tube placement if needed but the patient declined and mentions that he is feeling okay for now. Pleurx catheter was removed yesterday by Dr. Kerin Ransom. He was supposed to start cycle #2 today but has low platelets count.  On 04/14/2013, The patient has been complaining of increasing fatigue and weakness as well as lack of appetite and dysphagia secondary to prior history of head and neck cancer. He was recently admitted to [facility] for treatment of dehydration and failure to thrive. During his admission he underwent PEG tube placement and he is currently receiving supplemental nutrition through the PEG tube. He is able to maintain his weight well.  The patient is too weak today to resume systemic chemotherapy. I will keep  his treatment on hold for now. I would repeat CT scan of the chest, abdomen and pelvis for restaging of his disease before resuming her systemic therapy and if no improvement the patient may be considered for palliative care and hospice referral.  For the chemotherapy-induced anemia, I will arrange for the patient to receive 2 units of PRBCs transfusion.  On 04/25/2013 The patient came to the clinic today accompanied by his wife for routine followup visit. He is feeling much better after receiving 2 units of PRBCs transfusion. He has more energy. He also eats a little bit better with supplemental nutrition. He continues to have mild fatigue. He denied having any significant chest pain, shortness of breath, cough or hemoptysis. He had repeat CT scan of the chest, abdomen and pelvis and he is here for evaluation and discussion of his scan results.   MEDICAL HISTORY: Past Medical History  Diagnosis Date  . Hyperlipidemia   . OSA (obstructive sleep apnea)   . BPH (benign prostatic hypertrophy)   . Kidney disease     TCC  . Xerostomia   . Pharynx cancer     squamous cell stage 4,s/p XRT,chemo, neck dissection  . Colon polyp     HYPERPLASTIC & TUBULAR ADENOMA(Colonoscopy-Dr.[place])  . Diverticulosis of colon (without mention of hemorrhage)     (Colonoscopy-Dr.[place])  . Internal hemorrhoids without mention of complication     (Colonoscopy-Dr.[place])  . GERD (gastroesophageal reflux disease)     (  EGD-Dr. Velora Heckler)  . Atrophic gastritis without mention of hemorrhage     (EGD-Dr. Velora Heckler)  . History of radiation therapy   . Small cell lung cancer   . Non-small cell lung cancer     ALLERGIES:  is allergic to erythromycin and tetracycline.  MEDICATIONS:  Current Outpatient Prescriptions  Medication Sig Dispense Refill  . acetaminophen (TYLENOL) 500 MG tablet Take 500 mg by mouth every 4 (four) hours as needed (Pain).       Marland Kitchen antiseptic oral rinse (BIOTENE) LIQD 15 mLs by Mouth Rinse  route as needed for dry mouth.      . docusate sodium 100 MG CAPS Take 200 mg by mouth 2 (two) times daily.  10 capsule  0  . fentaNYL (DURAGESIC - DOSED MCG/HR) 50 MCG/HR Place 1 patch (50 mcg total) onto the skin every 3 (three) days.  10 patch  0  . finasteride (PROSCAR) 5 MG tablet Take 5 mg by mouth at bedtime.       Marland Kitchen levothyroxine (SYNTHROID, LEVOTHROID) 75 MCG tablet Take 75 mcg by mouth daily before breakfast.       . LORazepam (ATIVAN) 1 MG tablet Take 1 tablet (1 mg total) by mouth every 8 (eight) hours.  30 tablet  0  . mirtazapine (REMERON) 30 MG tablet Take 1 tablet (30 mg total) by mouth at bedtime.  30 tablet  0  . mometasone (NASONEX) 50 MCG/ACT nasal spray Place 2 sprays into the nose daily.      . Nutritional Supplements (FEEDING SUPPLEMENT, JEVITY 1.2 CAL,) LIQD Place 1,000 mLs into feeding tube every 6 (six) hours.  1000 mL  0  . ondansetron (ZOFRAN) 4 MG tablet Take 1 tablet (4 mg total) by mouth every 8 (eight) hours as needed for nausea or vomiting.  90 tablet  2  . oxyCODONE (OXY IR/ROXICODONE) 5 MG immediate release tablet Take 1 tablet (5 mg total) by mouth every 4 (four) hours as needed for moderate pain or severe pain.  30 tablet  0  . terazosin (HYTRIN) 1 MG capsule Take 1 mg by mouth 2 (two) times daily.        No current facility-administered medications for this visit.    SURGICAL HISTORY:  Past Surgical History  Procedure Laterality Date  . Appendectomy    . Lumbar laminectomy    . Tonsillectomy    . Bilateral ganglionectomies    . Radical right neck dissection  2008  . Pci-rfa renal cell (tcc) cancer  2009    pt denies  . Cholecystectomy  dec. 2009  . Septoplasty  03/01/12    with double turbinectomy  . Video bronchoscopy Bilateral 08/10/2012    Procedure: VIDEO BRONCHOSCOPY WITH FLUORO;  Surgeon: Rigoberto Noel, MD;  Location: [facility];  Service: Cardiopulmonary;  Laterality: Bilateral;  . Shoulder arthroscopy Left 2013  . Thoracentesis Right  10/2012    REVIEW OF SYSTEMS:  Constitutional: negative Eyes: negative Ears, nose, mouth, throat, and face: negative Respiratory: positive for pleurisy/chest pain Cardiovascular: negative Gastrointestinal: positive for dysphagia Genitourinary:negative Integument/breast: negative Hematologic/lymphatic: negative Musculoskeletal:positive for back pain Neurological: negative Behavioral/Psych: positive for anxiety, bad mood, depression, irritability and mood swings Endocrine: negative Allergic/Immunologic: negative   PHYSICAL EXAMINATION: General appearance: alert, cooperative and no distress Head: Normocephalic, without obvious abnormality, atraumatic Neck: no adenopathy and supple, symmetrical, trachea midline Lymph nodes: Cervical, supraclavicular, and axillary nodes normal. Resp: clear to auscultation bilaterally Back: symmetric, no curvature. ROM normal. No CVA tenderness. Cardio: regular rate  and rhythm, S1, S2 normal, no murmur, click, rub or gallop GI: soft, non-tender; bowel sounds normal; no masses,  no organomegaly Extremities: extremities normal, atraumatic, no cyanosis or edema Neurologic: Alert and oriented X 3, normal strength and tone. Normal symmetric reflexes. Normal coordination and gait   ECOG PERFORMANCE STATUS: 1 - Symptomatic but completely ambulatory  Blood pressure 129/57, pulse 78, temperature 98.7 F (37.1 C), temperature source Oral, resp. rate 18, height 6' (1.829 m), weight 167 lb 9.6 oz (76.023 kg).  LABORATORY DATA: Lab Results  Component Value Date   WBC 7.5 04/25/2013   HGB 8.7* 04/25/2013   HCT 27.3* 04/25/2013   MCV 90.1 04/25/2013   PLT 312 04/25/2013      Chemistry      Component Value Date/Time   NA 142 04/25/2013 1438   NA 131* 04/05/2013 0545   K 3.9 04/25/2013 1438   K 3.8 04/05/2013 0545   CL 93* 04/05/2013 0545   CO2 27 04/25/2013 1438   CO2 27 04/05/2013 0545   BUN 26.0 04/25/2013 1438   BUN 8 04/05/2013 0545   CREATININE 0.9 04/25/2013 1438    CREATININE 0.79 04/05/2013 0545      Component Value Date/Time   CALCIUM 8.5 04/25/2013 1438   CALCIUM 8.3* 04/05/2013 0545   ALKPHOS 97 04/25/2013 1438   ALKPHOS 76 05/13/2012 1005   AST 15 04/25/2013 1438   AST 16 05/13/2012 1005   ALT 22 04/25/2013 1438   ALT 11 05/13/2012 1005   BILITOT 0.22 04/25/2013 1438   BILITOT 0.5 05/13/2012 1005       RADIOGRAPHIC STUDIES: Ct Chest W Contrast  04/21/2013   CLINICAL DATA:  Small cell lung cancer  EXAM: CT CHEST, ABDOMEN, AND PELVIS WITH CONTRAST  TECHNIQUE: Multidetector CT imaging of the chest, abdomen and pelvis was performed following the standard protocol during bolus administration of intravenous contrast.  CONTRAST:  158m OMNIPAQUE IOHEXOL 300 MG/ML  SOLN  COMPARISON:  CT CHEST W/CM dated 02/23/2013; CT ABD/PELVIS W CM dated 02/23/2013; NM PET IMAGE INITIAL (PI) SKULL BASE TO THIGH dated 08/06/2012;   CT CHEST W/CM dated 11/08/2012  FINDINGS: CT CHEST FINDINGS  There is a port in the left anterior chest wall. No axillary or supraclavicular lymphadenopathy. No mediastinal or hilar lymphadenopathy. No pericardial fluid.  There is a right lower hemi thorax empyema which is not changed significantly in volume compared to prior measuring 4.9 x 11.2 cm in axial dimension compared to 4.9 x 11.7 cm on prior. There has been interval removal of a small bore drainage catheter from the a fluid collection in the right lower lobe.  There is interval decrease in volume of the right perihilar mass which is no longer measurable and previously measured 3.3 cm. Peripheral to the mass there is rounded atelectasis versus less likely tumor measuring 3.7 x 2.6 cm in the right middle lobe decreased from 5.9 x 3.1 cm on prior.  There rounded atelectasis of the right lower lobe is similar prior. Upper lung is clear. Within the left lung, rounded 6 mm pulmonary nodule (image 36) is decreased from 9 mm on prior.    CT ABDOMEN AND PELVIS FINDINGS  Multiple low-density lesions within the  liver. Have simple fluid attenuation. Post cholecystectomy. The pancreas, spleen, adrenal glands, and kidneys are normal.  Percutaneous gastrostomy tube in the stomach. No abnormality small bowel or cecum. There are multiple diverticula of the descending colon sigmoid colon without acute inflammation.  Abdominal aorta is normal caliber.  No retroperitoneal periportal lymphadenopathy.  No free fluid the pelvis. Prostate gland is enlarged to 6.2 cm. No pelvic lymphadenopathy. No aggressive osseous lesion.    IMPRESSION: 1. Decreased in the volume of right hilar mass. 2. Interval decrease in volume of right middle lobe round atelectasis versus less likely tumor. 3. Large right hemithorax empyema not changed in volume. Interval drainage catheter removal. 4. Rounded atelectasis in the right lower lobe is unchanged. 5. Interval decrease in size of left lower lobe pulmonary nodule. 6. No evidence metastasis within the abdomen or pelvis. 7. Prostate hypertrophy.   Electronically Signed   By: Suzy Bouchard M.D.   On: 04/21/2013 14:33   ASSESSMENT AND PLAN: This is another very extensive and long visit with the patient and his wife today. The patient is a 79 years old white male who was diagnosed with extensive stage small cell lung cancer status post systemic chemotherapy with carboplatin and etoposide with progressive disease on the right side of the chest. He was recently seen at [facility] by Dr. Sharlet Salina and was considered for a clinical trial with Ipilumomab and Nivolumab.  Recent repeat biopsy of a right pleural based nodule showed poorly differentiated non-small cell carcinoma. He is currently undergoing systemic chemotherapy with carboplatin and Abraxane status post 2 cycles. He tolerated the chemotherapy fairly well except for increasing fatigue secondary to chemotherapy-induced anemia as well as thrombocytopenia. His recent scan showed significant improvement in his  disease. I discussed the scan results and showed the images to the patient and his wife. The patient and his wife were happy with the scan results. I recommended for him to proceed with at least 2 more cycles of systemic chemotherapy with carboplatin and Abraxane but I would reduce the dose of carboplatin to AUC 4.5 on day 1 and Abraxane to 80 mg/M2 on days 1, 8 and 15 every 3 weeks because of the adverse effect of this treatment in the last 2 cycles. The patient and his wife had several questions today and I answered them completely to their satisfaction. He is still wondering about lung transplant for his lung cancer and I explained to the patient that this is not the standard of care and I'm not sure if there is any institution doing that for lung cancer. He was not happy with the answer. I gave him the option of referral to a tertiary center lab [facility] to discuss this option but he declined. He will resume her systemic chemotherapy on 04/28/2013. I will arrange for the patient to receive 1 unit of PRBCs transfusion that day. He would come back for followup visit in 4 weeks for evaluation and management any adverse effect of his treatment. For the pain management, the patient will continue on his current pain medication with fentanyl patch and he is not using much of the Percocet. The patient was given a refill for Percocet and Ativan. He was advised to call immediately if he has any concerning symptoms in the interval. The patient voices understanding of current disease status and treatment options and is in agreement with the current care plan.  All questions were answered. The patient knows to call the clinic with any problems, questions or concerns. We can certainly see the patient much sooner if necessary. I spent 35 minutes on face-to-face counseling with the patient and his wife today of the total visit time of 45 minutes.  Disclaimer: This note was dictated with voice recognition  software. Similar sounding words  can inadvertently be transcribed and may not be corrected upon review.  Eilleen Kempf., MD 04/25/2013

## 2013-04-25 NOTE — Telephone Encounter (Signed)
Gave pt appt for lab,md and chemo for April , emailed [facility] regardeing chemo for May 2015

## 2013-04-25 NOTE — Patient Instructions (Signed)

## 2013-04-26 ENCOUNTER — Telehealth: Payer: Self-pay | Admitting: Internal Medicine

## 2013-04-26 ENCOUNTER — Other Ambulatory Visit: Payer: Self-pay | Admitting: *Deleted

## 2013-04-26 ENCOUNTER — Ambulatory Visit (HOSPITAL_COMMUNITY)
Admission: RE | Admit: 2013-04-26 | Discharge: 2013-04-26 | Disposition: A | Discharge status: Home / Self Care | Payer: Medicare HMO | Source: Ambulatory Visit | Attending: Internal Medicine | Admitting: Internal Medicine

## 2013-04-26 DIAGNOSIS — D649 Anemia, unspecified: Secondary | ICD-10-CM | POA: Insufficient documentation

## 2013-04-26 NOTE — Telephone Encounter (Signed)
lvm for pt regarding to 4.9.15 appt time change

## 2013-04-27 ENCOUNTER — Other Ambulatory Visit: Payer: Self-pay

## 2013-04-28 ENCOUNTER — Ambulatory Visit (HOSPITAL_BASED_OUTPATIENT_CLINIC_OR_DEPARTMENT_OTHER): Payer: Commercial Managed Care - HMO

## 2013-04-28 ENCOUNTER — Other Ambulatory Visit: Payer: Medicare HMO

## 2013-04-28 ENCOUNTER — Other Ambulatory Visit: Payer: Self-pay | Admitting: *Deleted

## 2013-04-28 ENCOUNTER — Ambulatory Visit: Payer: Medicare HMO

## 2013-04-28 VITALS — BP 122/59 | HR 88 | Temp 99.0°F

## 2013-04-28 VITALS — BP 112/65 | HR 68 | Temp 95.4°F | Resp 16

## 2013-04-28 DIAGNOSIS — Z95828 Presence of other vascular implants and grafts: Secondary | ICD-10-CM

## 2013-04-28 DIAGNOSIS — C3490 Malignant neoplasm of unspecified part of unspecified bronchus or lung: Secondary | ICD-10-CM

## 2013-04-28 DIAGNOSIS — D649 Anemia, unspecified: Secondary | ICD-10-CM

## 2013-04-28 DIAGNOSIS — C3491 Malignant neoplasm of unspecified part of right bronchus or lung: Secondary | ICD-10-CM

## 2013-04-28 DIAGNOSIS — Z5111 Encounter for antineoplastic chemotherapy: Secondary | ICD-10-CM

## 2013-04-28 DIAGNOSIS — Z4682 Encounter for fitting and adjustment of non-vascular catheter: Secondary | ICD-10-CM

## 2013-04-28 LAB — CBC WITH DIFFERENTIAL/PLATELET
BASO%: 0.4 % (ref 0.0–2.0)
BASOS ABS: 0 10*3/uL (ref 0.0–0.1)
EOS ABS: 0.1 10*3/uL (ref 0.0–0.5)
EOS%: 1.1 % (ref 0.0–7.0)
HCT: 26.2 % — ABNORMAL LOW (ref 38.4–49.9)
HEMOGLOBIN: 8.4 g/dL — AB (ref 13.0–17.1)
LYMPH%: 6.9 % — AB (ref 14.0–49.0)
MCH: 28.9 pg (ref 27.2–33.4)
MCHC: 31.9 g/dL — ABNORMAL LOW (ref 32.0–36.0)
MCV: 90.7 fL (ref 79.3–98.0)
MONO#: 1.2 10*3/uL — ABNORMAL HIGH (ref 0.1–0.9)
MONO%: 13.6 % (ref 0.0–14.0)
NEUT%: 78 % — ABNORMAL HIGH (ref 39.0–75.0)
NEUTROS ABS: 6.9 10*3/uL — AB (ref 1.5–6.5)
PLATELETS: 280 10*3/uL (ref 140–400)
RBC: 2.89 10*6/uL — ABNORMAL LOW (ref 4.20–5.82)
RDW: 18.8 % — AB (ref 11.0–14.6)
WBC: 8.8 10*3/uL (ref 4.0–10.3)
lymph#: 0.6 10*3/uL — ABNORMAL LOW (ref 0.9–3.3)

## 2013-04-28 LAB — COMPREHENSIVE METABOLIC PANEL (CC13)
ALT: 14 U/L (ref 0–55)
ANION GAP: 9 meq/L (ref 3–11)
AST: 15 U/L (ref 5–34)
Albumin: 2 g/dL — ABNORMAL LOW (ref 3.5–5.0)
Alkaline Phosphatase: 103 U/L (ref 40–150)
BUN: 19 mg/dL (ref 7.0–26.0)
CALCIUM: 9 mg/dL (ref 8.4–10.4)
CHLORIDE: 107 meq/L (ref 98–109)
CO2: 26 mEq/L (ref 22–29)
CREATININE: 0.7 mg/dL (ref 0.7–1.3)
GLUCOSE: 105 mg/dL (ref 70–140)
Potassium: 3.8 mEq/L (ref 3.5–5.1)
Sodium: 142 mEq/L (ref 136–145)
Total Bilirubin: 0.23 mg/dL (ref 0.20–1.20)
Total Protein: 6.7 g/dL (ref 6.4–8.3)

## 2013-04-28 LAB — PREPARE RBC (CROSSMATCH)

## 2013-04-28 MED ORDER — ONDANSETRON 16 MG/50ML IVPB (CHCC)
16.0000 mg | Freq: Once | INTRAVENOUS | Status: AC
  Administered 2013-04-28: 16 mg via INTRAVENOUS

## 2013-04-28 MED ORDER — ACETAMINOPHEN 325 MG PO TABS
650.0000 mg | ORAL_TABLET | Freq: Once | ORAL | Status: AC
  Administered 2013-04-28: 650 mg via ORAL

## 2013-04-28 MED ORDER — ONDANSETRON 16 MG/50ML IVPB (CHCC)
INTRAVENOUS | Status: AC
  Filled 2013-04-28: qty 16

## 2013-04-28 MED ORDER — DIPHENHYDRAMINE HCL 25 MG PO CAPS
25.0000 mg | ORAL_CAPSULE | Freq: Once | ORAL | Status: AC
  Administered 2013-04-28: 25 mg via ORAL

## 2013-04-28 MED ORDER — SODIUM CHLORIDE 0.9 % IV SOLN
Freq: Once | INTRAVENOUS | Status: AC
  Administered 2013-04-28: 13:00:00 via INTRAVENOUS

## 2013-04-28 MED ORDER — SODIUM CHLORIDE 0.9 % IJ SOLN
10.0000 mL | INTRAMUSCULAR | Status: DC | PRN
  Administered 2013-04-28: 10 mL via INTRAVENOUS
  Filled 2013-04-28: qty 10

## 2013-04-28 MED ORDER — SODIUM CHLORIDE 0.9 % IJ SOLN
10.0000 mL | INTRAMUSCULAR | Status: DC | PRN
  Administered 2013-04-28: 10 mL
  Filled 2013-04-28: qty 10

## 2013-04-28 MED ORDER — FENTANYL 50 MCG/HR TD PT72
50.0000 ug | MEDICATED_PATCH | TRANSDERMAL | Status: DC
Start: 2013-04-28 — End: 2013-05-23

## 2013-04-28 MED ORDER — DIPHENHYDRAMINE HCL 25 MG PO CAPS
ORAL_CAPSULE | ORAL | Status: AC
  Filled 2013-04-28: qty 1

## 2013-04-28 MED ORDER — PACLITAXEL PROTEIN-BOUND CHEMO INJECTION 100 MG
80.0000 mg/m2 | Freq: Once | INTRAVENOUS | Status: AC
  Administered 2013-04-28: 150 mg via INTRAVENOUS
  Filled 2013-04-28: qty 30

## 2013-04-28 MED ORDER — HEPARIN SOD (PORK) LOCK FLUSH 100 UNIT/ML IV SOLN
500.0000 [IU] | Freq: Once | INTRAVENOUS | Status: AC | PRN
  Administered 2013-04-28: 500 [IU]
  Filled 2013-04-28: qty 5

## 2013-04-28 MED ORDER — SODIUM CHLORIDE 0.9 % IV SOLN
420.0000 mg | Freq: Once | INTRAVENOUS | Status: AC
  Administered 2013-04-28: 420 mg via INTRAVENOUS
  Filled 2013-04-28: qty 42

## 2013-04-28 MED ORDER — ACETAMINOPHEN 325 MG PO TABS
ORAL_TABLET | ORAL | Status: AC
  Filled 2013-04-28: qty 2

## 2013-04-28 MED ORDER — DEXAMETHASONE SODIUM PHOSPHATE 20 MG/5ML IJ SOLN
INTRAMUSCULAR | Status: AC
  Filled 2013-04-28: qty 5

## 2013-04-28 MED ORDER — DEXAMETHASONE SODIUM PHOSPHATE 20 MG/5ML IJ SOLN
20.0000 mg | Freq: Once | INTRAMUSCULAR | Status: AC
  Administered 2013-04-28: 20 mg via INTRAVENOUS

## 2013-04-28 MED ORDER — HEPARIN SOD (PORK) LOCK FLUSH 100 UNIT/ML IV SOLN
500.0000 [IU] | Freq: Once | INTRAVENOUS | Status: AC
  Administered 2013-04-28: 500 [IU] via INTRAVENOUS
  Filled 2013-04-28: qty 5

## 2013-04-28 NOTE — Progress Notes (Signed)
Ok to treat today per Dr. Julien Nordmann with Hgb 8.4. Cindi Carbon, RN

## 2013-04-28 NOTE — Patient Instructions (Signed)
[facility] Discharge Instructions for Patients Receiving Chemotherapy  Today you received the following chemotherapy agents: Abraxane, Carboplatin  To help prevent nausea and vomiting after your treatment, we encourage you to take your nausea medication as prescribed.    If you develop nausea and vomiting that is not controlled by your nausea medication, call the clinic.   BELOW ARE SYMPTOMS THAT SHOULD BE REPORTED IMMEDIATELY:  *FEVER GREATER THAN 100.5 F  *CHILLS WITH OR WITHOUT FEVER  NAUSEA AND VOMITING THAT IS NOT CONTROLLED WITH YOUR NAUSEA MEDICATION  *UNUSUAL SHORTNESS OF BREATH  *UNUSUAL BRUISING OR BLEEDING  TENDERNESS IN MOUTH AND THROAT WITH OR WITHOUT PRESENCE OF ULCERS  *URINARY PROBLEMS  *BOWEL PROBLEMS  UNUSUAL RASH Items with * indicate a potential emergency and should be followed up as soon as possible.  Feel free to call the clinic you have any questions or concerns. The clinic phone number is [phone].

## 2013-04-29 ENCOUNTER — Telehealth: Payer: Self-pay | Admitting: Internal Medicine

## 2013-04-29 LAB — TYPE AND SCREEN
ABO/RH(D): A POS
Antibody Screen: NEGATIVE
Unit division: 0

## 2013-04-29 NOTE — Telephone Encounter (Signed)
talked to pt's wife she is aware of all appts for APril 2015lab,md and chemo

## 2013-05-05 ENCOUNTER — Ambulatory Visit (HOSPITAL_BASED_OUTPATIENT_CLINIC_OR_DEPARTMENT_OTHER): Payer: Commercial Managed Care - HMO

## 2013-05-05 ENCOUNTER — Ambulatory Visit: Payer: Commercial Managed Care - HMO

## 2013-05-05 ENCOUNTER — Other Ambulatory Visit: Payer: Self-pay | Admitting: *Deleted

## 2013-05-05 ENCOUNTER — Other Ambulatory Visit (HOSPITAL_BASED_OUTPATIENT_CLINIC_OR_DEPARTMENT_OTHER): Payer: Medicare HMO

## 2013-05-05 ENCOUNTER — Other Ambulatory Visit: Payer: Self-pay | Admitting: Medical Oncology

## 2013-05-05 ENCOUNTER — Other Ambulatory Visit: Payer: Self-pay | Admitting: Internal Medicine

## 2013-05-05 VITALS — BP 112/80 | HR 84 | Temp 98.6°F | Resp 16

## 2013-05-05 VITALS — BP 125/58 | HR 79 | Temp 99.4°F

## 2013-05-05 DIAGNOSIS — C342 Malignant neoplasm of middle lobe, bronchus or lung: Secondary | ICD-10-CM

## 2013-05-05 DIAGNOSIS — Z95828 Presence of other vascular implants and grafts: Secondary | ICD-10-CM

## 2013-05-05 DIAGNOSIS — C3490 Malignant neoplasm of unspecified part of unspecified bronchus or lung: Secondary | ICD-10-CM

## 2013-05-05 DIAGNOSIS — Z5111 Encounter for antineoplastic chemotherapy: Secondary | ICD-10-CM

## 2013-05-05 DIAGNOSIS — C3491 Malignant neoplasm of unspecified part of right bronchus or lung: Secondary | ICD-10-CM

## 2013-05-05 LAB — CBC WITH DIFFERENTIAL/PLATELET
BASO%: 0.3 % (ref 0.0–2.0)
Basophils Absolute: 0 10*3/uL (ref 0.0–0.1)
EOS ABS: 0.1 10*3/uL (ref 0.0–0.5)
EOS%: 1.5 % (ref 0.0–7.0)
HCT: 26.3 % — ABNORMAL LOW (ref 38.4–49.9)
HGB: 8.5 g/dL — ABNORMAL LOW (ref 13.0–17.1)
LYMPH#: 0.6 10*3/uL — AB (ref 0.9–3.3)
LYMPH%: 9.6 % — ABNORMAL LOW (ref 14.0–49.0)
MCH: 29.6 pg (ref 27.2–33.4)
MCHC: 32.5 g/dL (ref 32.0–36.0)
MCV: 91.1 fL (ref 79.3–98.0)
MONO#: 0.4 10*3/uL (ref 0.1–0.9)
MONO%: 7.6 % (ref 0.0–14.0)
NEUT%: 81 % — ABNORMAL HIGH (ref 39.0–75.0)
NEUTROS ABS: 4.8 10*3/uL (ref 1.5–6.5)
Platelets: 198 10*3/uL (ref 140–400)
RBC: 2.89 10*6/uL — ABNORMAL LOW (ref 4.20–5.82)
RDW: 17.6 % — AB (ref 11.0–14.6)
WBC: 5.9 10*3/uL (ref 4.0–10.3)

## 2013-05-05 LAB — COMPREHENSIVE METABOLIC PANEL (CC13)
ALBUMIN: 2.2 g/dL — AB (ref 3.5–5.0)
ALT: 14 U/L (ref 0–55)
AST: 17 U/L (ref 5–34)
Alkaline Phosphatase: 103 U/L (ref 40–150)
Anion Gap: 6 mEq/L (ref 3–11)
BUN: 24.2 mg/dL (ref 7.0–26.0)
CALCIUM: 8.8 mg/dL (ref 8.4–10.4)
CHLORIDE: 105 meq/L (ref 98–109)
CO2: 28 mEq/L (ref 22–29)
Creatinine: 0.8 mg/dL (ref 0.7–1.3)
GLUCOSE: 115 mg/dL (ref 70–140)
POTASSIUM: 3.8 meq/L (ref 3.5–5.1)
SODIUM: 139 meq/L (ref 136–145)
TOTAL PROTEIN: 6.5 g/dL (ref 6.4–8.3)
Total Bilirubin: 0.22 mg/dL (ref 0.20–1.20)

## 2013-05-05 MED ORDER — HEPARIN SOD (PORK) LOCK FLUSH 100 UNIT/ML IV SOLN
500.0000 [IU] | Freq: Once | INTRAVENOUS | Status: AC
  Administered 2013-05-05: 500 [IU] via INTRAVENOUS
  Filled 2013-05-05: qty 5

## 2013-05-05 MED ORDER — SODIUM CHLORIDE 0.9 % IJ SOLN
10.0000 mL | INTRAMUSCULAR | Status: DC | PRN
  Administered 2013-05-05: 10 mL via INTRAVENOUS
  Filled 2013-05-05: qty 10

## 2013-05-05 MED ORDER — ONDANSETRON 8 MG/50ML IVPB (CHCC)
8.0000 mg | Freq: Once | INTRAVENOUS | Status: AC
  Administered 2013-05-05: 8 mg via INTRAVENOUS

## 2013-05-05 MED ORDER — SODIUM CHLORIDE 0.9 % IJ SOLN
10.0000 mL | INTRAMUSCULAR | Status: DC | PRN
  Administered 2013-05-05: 10 mL
  Filled 2013-05-05: qty 10

## 2013-05-05 MED ORDER — DEXAMETHASONE SODIUM PHOSPHATE 10 MG/ML IJ SOLN
INTRAMUSCULAR | Status: AC
  Filled 2013-05-05: qty 1

## 2013-05-05 MED ORDER — PACLITAXEL PROTEIN-BOUND CHEMO INJECTION 100 MG
80.0000 mg/m2 | Freq: Once | INTRAVENOUS | Status: AC
  Administered 2013-05-05: 150 mg via INTRAVENOUS
  Filled 2013-05-05: qty 30

## 2013-05-05 MED ORDER — SODIUM CHLORIDE 0.9 % IV SOLN
Freq: Once | INTRAVENOUS | Status: AC
  Administered 2013-05-05: 15:00:00 via INTRAVENOUS

## 2013-05-05 MED ORDER — ACETAMINOPHEN 325 MG PO TABS
650.0000 mg | ORAL_TABLET | Freq: Once | ORAL | Status: AC
  Administered 2013-05-05: 650 mg via ORAL

## 2013-05-05 MED ORDER — HEPARIN SOD (PORK) LOCK FLUSH 100 UNIT/ML IV SOLN
500.0000 [IU] | Freq: Once | INTRAVENOUS | Status: AC | PRN
  Administered 2013-05-05: 500 [IU]
  Filled 2013-05-05: qty 5

## 2013-05-05 MED ORDER — DEXAMETHASONE SODIUM PHOSPHATE 10 MG/ML IJ SOLN
10.0000 mg | Freq: Once | INTRAMUSCULAR | Status: AC
  Administered 2013-05-05: 10 mg via INTRAVENOUS

## 2013-05-05 MED ORDER — ACETAMINOPHEN 325 MG PO TABS
ORAL_TABLET | ORAL | Status: AC
  Filled 2013-05-05: qty 2

## 2013-05-05 MED ORDER — ONDANSETRON 8 MG/NS 50 ML IVPB
INTRAVENOUS | Status: AC
  Filled 2013-05-05: qty 8

## 2013-05-05 NOTE — Patient Instructions (Signed)
[facility] Discharge Instructions for Patients Receiving Chemotherapy  Today you received the following chemotherapy agents abraxane.   To help prevent nausea and vomiting after your treatment, we encourage you to take your nausea medication as directed.    If you develop nausea and vomiting that is not controlled by your nausea medication, call the clinic.   BELOW ARE SYMPTOMS THAT SHOULD BE REPORTED IMMEDIATELY:  *FEVER GREATER THAN 100.5 F  *CHILLS WITH OR WITHOUT FEVER  NAUSEA AND VOMITING THAT IS NOT CONTROLLED WITH YOUR NAUSEA MEDICATION  *UNUSUAL SHORTNESS OF BREATH  *UNUSUAL BRUISING OR BLEEDING  TENDERNESS IN MOUTH AND THROAT WITH OR WITHOUT PRESENCE OF ULCERS  *URINARY PROBLEMS  *BOWEL PROBLEMS  UNUSUAL RASH Items with * indicate a potential emergency and should be followed up as soon as possible.  Feel free to call the clinic you have any questions or concerns. The clinic phone number is [phone].

## 2013-05-10 ENCOUNTER — Telehealth: Payer: Self-pay | Admitting: *Deleted

## 2013-05-10 NOTE — Telephone Encounter (Signed)
Pt's wife called wanting to know if pt's hip pain that he has been experiencing is r/t his cancer.  Per Dr Vista Mink, no cancer shows on the CT scan.  It could possibly by arthritis?  He needs to keep taking his pain medication as needed.  She also asked about a blood transfusion.  Informed her that we will check his CBC on 4/23 and based on his symptoms his infusion RN can talk to Dr Vista Mink and see whether he needs a unit of blood.  SLJ

## 2013-05-12 ENCOUNTER — Ambulatory Visit (HOSPITAL_BASED_OUTPATIENT_CLINIC_OR_DEPARTMENT_OTHER): Payer: Commercial Managed Care - HMO

## 2013-05-12 ENCOUNTER — Other Ambulatory Visit (HOSPITAL_BASED_OUTPATIENT_CLINIC_OR_DEPARTMENT_OTHER): Payer: Commercial Managed Care - HMO

## 2013-05-12 ENCOUNTER — Other Ambulatory Visit: Payer: Self-pay | Admitting: *Deleted

## 2013-05-12 VITALS — BP 102/59 | HR 77 | Temp 98.3°F

## 2013-05-12 VITALS — BP 125/60 | HR 80 | Temp 98.7°F | Resp 18

## 2013-05-12 DIAGNOSIS — C342 Malignant neoplasm of middle lobe, bronchus or lung: Secondary | ICD-10-CM

## 2013-05-12 DIAGNOSIS — Z5111 Encounter for antineoplastic chemotherapy: Secondary | ICD-10-CM

## 2013-05-12 DIAGNOSIS — Z452 Encounter for adjustment and management of vascular access device: Secondary | ICD-10-CM

## 2013-05-12 DIAGNOSIS — C782 Secondary malignant neoplasm of pleura: Secondary | ICD-10-CM

## 2013-05-12 DIAGNOSIS — C3490 Malignant neoplasm of unspecified part of unspecified bronchus or lung: Secondary | ICD-10-CM

## 2013-05-12 DIAGNOSIS — C3491 Malignant neoplasm of unspecified part of right bronchus or lung: Secondary | ICD-10-CM

## 2013-05-12 DIAGNOSIS — Z95828 Presence of other vascular implants and grafts: Secondary | ICD-10-CM

## 2013-05-12 LAB — CBC WITH DIFFERENTIAL/PLATELET
BASO%: 0.6 % (ref 0.0–2.0)
BASOS ABS: 0 10*3/uL (ref 0.0–0.1)
EOS ABS: 0.1 10*3/uL (ref 0.0–0.5)
EOS%: 1.8 % (ref 0.0–7.0)
HCT: 25.3 % — ABNORMAL LOW (ref 38.4–49.9)
HEMOGLOBIN: 8.1 g/dL — AB (ref 13.0–17.1)
LYMPH%: 12.9 % — AB (ref 14.0–49.0)
MCH: 29.6 pg (ref 27.2–33.4)
MCHC: 32.2 g/dL (ref 32.0–36.0)
MCV: 92 fL (ref 79.3–98.0)
MONO#: 0.5 10*3/uL (ref 0.1–0.9)
MONO%: 12.8 % (ref 0.0–14.0)
NEUT%: 71.9 % (ref 39.0–75.0)
NEUTROS ABS: 2.7 10*3/uL (ref 1.5–6.5)
PLATELETS: 149 10*3/uL (ref 140–400)
RBC: 2.74 10*6/uL — ABNORMAL LOW (ref 4.20–5.82)
RDW: 18.2 % — AB (ref 11.0–14.6)
WBC: 3.8 10*3/uL — ABNORMAL LOW (ref 4.0–10.3)
lymph#: 0.5 10*3/uL — ABNORMAL LOW (ref 0.9–3.3)

## 2013-05-12 LAB — COMPREHENSIVE METABOLIC PANEL (CC13)
ALK PHOS: 80 U/L (ref 40–150)
ALT: 8 U/L (ref 0–55)
AST: 13 U/L (ref 5–34)
Albumin: 2.3 g/dL — ABNORMAL LOW (ref 3.5–5.0)
Anion Gap: 10 mEq/L (ref 3–11)
BUN: 22.8 mg/dL (ref 7.0–26.0)
CO2: 25 meq/L (ref 22–29)
Calcium: 8.9 mg/dL (ref 8.4–10.4)
Chloride: 108 mEq/L (ref 98–109)
Creatinine: 0.8 mg/dL (ref 0.7–1.3)
GLUCOSE: 123 mg/dL (ref 70–140)
POTASSIUM: 3.9 meq/L (ref 3.5–5.1)
Sodium: 143 mEq/L (ref 136–145)
Total Bilirubin: <0.2 mg/dL (ref 0.20–1.20)
Total Protein: 6.3 g/dL — ABNORMAL LOW (ref 6.4–8.3)

## 2013-05-12 LAB — PREPARE RBC (CROSSMATCH)

## 2013-05-12 MED ORDER — SODIUM CHLORIDE 0.9 % IJ SOLN
10.0000 mL | INTRAMUSCULAR | Status: DC | PRN
  Filled 2013-05-12: qty 10

## 2013-05-12 MED ORDER — PACLITAXEL PROTEIN-BOUND CHEMO INJECTION 100 MG
80.0000 mg/m2 | Freq: Once | INTRAVENOUS | Status: AC
  Administered 2013-05-12: 150 mg via INTRAVENOUS
  Filled 2013-05-12: qty 30

## 2013-05-12 MED ORDER — ONDANSETRON 8 MG/50ML IVPB (CHCC)
8.0000 mg | Freq: Once | INTRAVENOUS | Status: AC
  Administered 2013-05-12: 8 mg via INTRAVENOUS

## 2013-05-12 MED ORDER — OXYCODONE HCL 5 MG PO TABS: 5.0000 mg | ORAL_TABLET | ORAL | Status: DC | PRN

## 2013-05-12 MED ORDER — GABAPENTIN 100 MG PO CAPS: 100.0000 mg | ORAL_CAPSULE | Freq: Three times a day (TID) | ORAL | Status: DC

## 2013-05-12 MED ORDER — ONDANSETRON 8 MG/NS 50 ML IVPB
INTRAVENOUS | Status: AC
  Filled 2013-05-12: qty 8

## 2013-05-12 MED ORDER — DEXAMETHASONE SODIUM PHOSPHATE 10 MG/ML IJ SOLN
10.0000 mg | Freq: Once | INTRAMUSCULAR | Status: AC
  Administered 2013-05-12: 10 mg via INTRAVENOUS

## 2013-05-12 MED ORDER — SODIUM CHLORIDE 0.9 % IV SOLN
Freq: Once | INTRAVENOUS | Status: AC
  Administered 2013-05-12: 15:00:00 via INTRAVENOUS

## 2013-05-12 MED ORDER — SODIUM CHLORIDE 0.9 % IJ SOLN
10.0000 mL | INTRAMUSCULAR | Status: DC | PRN
  Administered 2013-05-12: 10 mL via INTRAVENOUS
  Filled 2013-05-12: qty 10

## 2013-05-12 MED ORDER — HEPARIN SOD (PORK) LOCK FLUSH 100 UNIT/ML IV SOLN
500.0000 [IU] | Freq: Once | INTRAVENOUS | Status: DC | PRN
  Filled 2013-05-12: qty 5

## 2013-05-12 MED ORDER — DEXAMETHASONE SODIUM PHOSPHATE 10 MG/ML IJ SOLN
INTRAMUSCULAR | Status: AC
  Filled 2013-05-12: qty 1

## 2013-05-12 MED ORDER — LORAZEPAM 1 MG PO TABS: 1.0000 mg | ORAL_TABLET | Freq: Three times a day (TID) | ORAL | Status: DC | PRN

## 2013-05-12 MED ORDER — HEPARIN SOD (PORK) LOCK FLUSH 100 UNIT/ML IV SOLN
500.0000 [IU] | Freq: Once | INTRAVENOUS | Status: AC
  Administered 2013-05-12: 500 [IU] via INTRAVENOUS
  Filled 2013-05-12: qty 5

## 2013-05-13 ENCOUNTER — Ambulatory Visit (HOSPITAL_BASED_OUTPATIENT_CLINIC_OR_DEPARTMENT_OTHER): Payer: Commercial Managed Care - HMO

## 2013-05-13 VITALS — BP 127/64 | HR 75 | Temp 98.3°F | Resp 18

## 2013-05-13 DIAGNOSIS — D649 Anemia, unspecified: Secondary | ICD-10-CM

## 2013-05-13 MED ORDER — SODIUM CHLORIDE 0.9 % IV SOLN
250.0000 mL | Freq: Once | INTRAVENOUS | Status: AC
  Administered 2013-05-13: 250 mL via INTRAVENOUS

## 2013-05-13 MED ORDER — SODIUM CHLORIDE 0.9 % IJ SOLN
10.0000 mL | INTRAMUSCULAR | Status: DC | PRN
  Administered 2013-05-13: 10 mL via INTRAVENOUS
  Filled 2013-05-13: qty 10

## 2013-05-13 MED ORDER — ACETAMINOPHEN 325 MG PO TABS
650.0000 mg | ORAL_TABLET | Freq: Once | ORAL | Status: AC
  Administered 2013-05-13: 650 mg via ORAL

## 2013-05-13 MED ORDER — DIPHENHYDRAMINE HCL 25 MG PO CAPS
25.0000 mg | ORAL_CAPSULE | Freq: Once | ORAL | Status: AC
  Administered 2013-05-13: 25 mg via ORAL

## 2013-05-13 MED ORDER — ACETAMINOPHEN 325 MG PO TABS
ORAL_TABLET | ORAL | Status: AC
  Filled 2013-05-13: qty 2

## 2013-05-13 MED ORDER — DIPHENHYDRAMINE HCL 25 MG PO CAPS
ORAL_CAPSULE | ORAL | Status: AC
  Filled 2013-05-13: qty 1

## 2013-05-13 MED ORDER — HEPARIN SOD (PORK) LOCK FLUSH 100 UNIT/ML IV SOLN
500.0000 [IU] | Freq: Once | INTRAVENOUS | Status: AC
  Administered 2013-05-13: 500 [IU] via INTRAVENOUS
  Filled 2013-05-13: qty 5

## 2013-05-13 NOTE — Patient Instructions (Signed)
Blood Transfusion Information WHAT IS A BLOOD TRANSFUSION? A transfusion is the replacement of blood or some of its parts. Blood is made up of multiple cells which provide different functions.  Red blood cells carry oxygen and are used for blood loss replacement.  White blood cells fight against infection.  Platelets control bleeding.  Plasma helps clot blood.  Other blood products are available for specialized needs, such as hemophilia or other clotting disorders. BEFORE THE TRANSFUSION  Who gives blood for transfusions?   You may be able to donate blood to be used at a later date on yourself (autologous donation).  Relatives can be asked to donate blood. This is generally not any safer than if you have received blood from a stranger. The same precautions are taken to ensure safety when a relative's blood is donated.  Healthy volunteers who are fully evaluated to make sure their blood is safe. This is blood bank blood. Transfusion therapy is the safest it has ever been in the practice of medicine. Before blood is taken from a donor, a complete history is taken to make sure that person has no history of diseases nor engages in risky social behavior (examples are intravenous drug use or sexual activity with multiple partners). The donor's travel history is screened to minimize risk of transmitting infections, such as malaria. The donated blood is tested for signs of infectious diseases, such as HIV and hepatitis. The blood is then tested to be sure it is compatible with you in order to minimize the chance of a transfusion reaction. If you or a relative donates blood, this is often done in anticipation of surgery and is not appropriate for emergency situations. It takes many days to process the donated blood. RISKS AND COMPLICATIONS Although transfusion therapy is very safe and saves many lives, the main dangers of transfusion include:   Getting an infectious disease.  Developing a  transfusion reaction. This is an allergic reaction to something in the blood you were given. Every precaution is taken to prevent this. The decision to have a blood transfusion has been considered carefully by your caregiver before blood is given. Blood is not given unless the benefits outweigh the risks. AFTER THE TRANSFUSION  Right after receiving a blood transfusion, you will usually feel much better and more energetic. This is especially true if your red blood cells have gotten low (anemic). The transfusion raises the level of the red blood cells which carry oxygen, and this usually causes an energy increase.  The nurse administering the transfusion will monitor you carefully for complications. HOME CARE INSTRUCTIONS  No special instructions are needed after a transfusion. You may find your energy is better. Speak with your caregiver about any limitations on activity for underlying diseases you may have. SEEK MEDICAL CARE IF:   Your condition is not improving after your transfusion.  You develop redness or irritation at the intravenous (IV) site. SEEK IMMEDIATE MEDICAL CARE IF:  Any of the following symptoms occur over the next 12 hours:  Shaking chills.  You have a temperature by mouth above 102 F (38.9 C), not controlled by medicine.  Chest, back, or muscle pain.  People around you feel you are not acting correctly or are confused.  Shortness of breath or difficulty breathing.  Dizziness and fainting.  You get a rash or develop hives.  You have a decrease in urine output.  Your urine turns a dark color or changes to pink, red, or brown. Any of the following   symptoms occur over the next 10 days:  You have a temperature by mouth above 102 F (38.9 C), not controlled by medicine.  Shortness of breath.  Weakness after normal activity.  The white part of the eye turns yellow (jaundice).  You have a decrease in the amount of urine or are urinating less often.  Your  urine turns a dark color or changes to pink, red, or brown. Document Released: 01/04/2000 Document Revised: 03/31/2011 Document Reviewed: 08/23/2007 Exit[employer] Patient Information 2014 [employer].  

## 2013-05-14 ENCOUNTER — Other Ambulatory Visit: Payer: Self-pay | Admitting: Physician Assistant

## 2013-05-14 LAB — TYPE AND SCREEN
ABO/RH(D): A POS
ANTIBODY SCREEN: NEGATIVE
UNIT DIVISION: 0
Unit division: 0

## 2013-05-16 ENCOUNTER — Encounter: Payer: Self-pay | Admitting: Internal Medicine

## 2013-05-16 NOTE — Progress Notes (Signed)
Put son's fmla form on nurse's desk.

## 2013-05-17 ENCOUNTER — Encounter: Payer: Self-pay | Admitting: Internal Medicine

## 2013-05-17 ENCOUNTER — Telehealth: Payer: Self-pay | Admitting: Medical Oncology

## 2013-05-17 NOTE — Progress Notes (Signed)
Faxed son's fmla form to [phone]

## 2013-05-17 NOTE — Telephone Encounter (Signed)
Synthroid ordered per Dr Linda Hedges -. Will send note to [facility].

## 2013-05-18 ENCOUNTER — Other Ambulatory Visit: Payer: Self-pay | Admitting: Medical Oncology

## 2013-05-18 DIAGNOSIS — E079 Disorder of thyroid, unspecified: Secondary | ICD-10-CM

## 2013-05-19 ENCOUNTER — Ambulatory Visit (HOSPITAL_BASED_OUTPATIENT_CLINIC_OR_DEPARTMENT_OTHER): Payer: Commercial Managed Care - HMO

## 2013-05-19 ENCOUNTER — Telehealth: Payer: Self-pay | Admitting: Internal Medicine

## 2013-05-19 ENCOUNTER — Other Ambulatory Visit: Payer: Medicare HMO

## 2013-05-19 ENCOUNTER — Other Ambulatory Visit (HOSPITAL_BASED_OUTPATIENT_CLINIC_OR_DEPARTMENT_OTHER): Payer: Commercial Managed Care - HMO

## 2013-05-19 ENCOUNTER — Encounter: Payer: Self-pay | Admitting: Internal Medicine

## 2013-05-19 ENCOUNTER — Ambulatory Visit (HOSPITAL_BASED_OUTPATIENT_CLINIC_OR_DEPARTMENT_OTHER): Payer: Commercial Managed Care - HMO | Admitting: Internal Medicine

## 2013-05-19 VITALS — BP 124/61 | HR 68 | Temp 98.1°F | Resp 18 | Ht 72.0 in | Wt 167.6 lb

## 2013-05-19 DIAGNOSIS — T451X5A Adverse effect of antineoplastic and immunosuppressive drugs, initial encounter: Secondary | ICD-10-CM

## 2013-05-19 DIAGNOSIS — D6959 Other secondary thrombocytopenia: Secondary | ICD-10-CM

## 2013-05-19 DIAGNOSIS — C3490 Malignant neoplasm of unspecified part of unspecified bronchus or lung: Secondary | ICD-10-CM

## 2013-05-19 DIAGNOSIS — D6481 Anemia due to antineoplastic chemotherapy: Secondary | ICD-10-CM

## 2013-05-19 DIAGNOSIS — R52 Pain, unspecified: Secondary | ICD-10-CM

## 2013-05-19 DIAGNOSIS — C3491 Malignant neoplasm of unspecified part of right bronchus or lung: Secondary | ICD-10-CM

## 2013-05-19 DIAGNOSIS — G609 Hereditary and idiopathic neuropathy, unspecified: Secondary | ICD-10-CM

## 2013-05-19 DIAGNOSIS — E079 Disorder of thyroid, unspecified: Secondary | ICD-10-CM

## 2013-05-19 DIAGNOSIS — C782 Secondary malignant neoplasm of pleura: Secondary | ICD-10-CM

## 2013-05-19 DIAGNOSIS — C342 Malignant neoplasm of middle lobe, bronchus or lung: Secondary | ICD-10-CM

## 2013-05-19 DIAGNOSIS — Z95828 Presence of other vascular implants and grafts: Secondary | ICD-10-CM

## 2013-05-19 DIAGNOSIS — Z5111 Encounter for antineoplastic chemotherapy: Secondary | ICD-10-CM

## 2013-05-19 DIAGNOSIS — M549 Dorsalgia, unspecified: Secondary | ICD-10-CM

## 2013-05-19 LAB — CBC WITH DIFFERENTIAL/PLATELET
BASO%: 1 % (ref 0.0–2.0)
Basophils Absolute: 0 10*3/uL (ref 0.0–0.1)
EOS%: 1.6 % (ref 0.0–7.0)
Eosinophils Absolute: 0 10*3/uL (ref 0.0–0.5)
HCT: 29.3 % — ABNORMAL LOW (ref 38.4–49.9)
HEMOGLOBIN: 9.6 g/dL — AB (ref 13.0–17.1)
LYMPH#: 0.5 10*3/uL — AB (ref 0.9–3.3)
LYMPH%: 17.2 % (ref 14.0–49.0)
MCH: 29.7 pg (ref 27.2–33.4)
MCHC: 32.6 g/dL (ref 32.0–36.0)
MCV: 91 fL (ref 79.3–98.0)
MONO#: 0.5 10*3/uL (ref 0.1–0.9)
MONO%: 15.9 % — ABNORMAL HIGH (ref 0.0–14.0)
NEUT#: 2 10*3/uL (ref 1.5–6.5)
NEUT%: 64.3 % (ref 39.0–75.0)
Platelets: 120 10*3/uL — ABNORMAL LOW (ref 140–400)
RBC: 3.22 10*6/uL — ABNORMAL LOW (ref 4.20–5.82)
RDW: 18.2 % — AB (ref 11.0–14.6)
WBC: 3.2 10*3/uL — ABNORMAL LOW (ref 4.0–10.3)

## 2013-05-19 LAB — COMPREHENSIVE METABOLIC PANEL (CC13)
ALBUMIN: 2.3 g/dL — AB (ref 3.5–5.0)
ALK PHOS: 75 U/L (ref 40–150)
ALT: 8 U/L (ref 0–55)
AST: 12 U/L (ref 5–34)
Anion Gap: 9 mEq/L (ref 3–11)
BUN: 21.3 mg/dL (ref 7.0–26.0)
CO2: 25 mEq/L (ref 22–29)
Calcium: 9 mg/dL (ref 8.4–10.4)
Chloride: 109 mEq/L (ref 98–109)
Creatinine: 0.8 mg/dL (ref 0.7–1.3)
Glucose: 89 mg/dl (ref 70–140)
POTASSIUM: 4 meq/L (ref 3.5–5.1)
SODIUM: 142 meq/L (ref 136–145)
TOTAL PROTEIN: 6.1 g/dL — AB (ref 6.4–8.3)
Total Bilirubin: <0.2 mg/dL (ref 0.20–1.20)

## 2013-05-19 LAB — TSH CHCC: TSH: 13.837 m[IU]/L — AB (ref 0.320–4.118)

## 2013-05-19 MED ORDER — SODIUM CHLORIDE 0.9 % IV SOLN
Freq: Once | INTRAVENOUS | Status: AC
  Administered 2013-05-19: 11:00:00 via INTRAVENOUS

## 2013-05-19 MED ORDER — CARBOPLATIN CHEMO INTRADERMAL TEST DOSE 100MCG/0.02ML
100.0000 ug | Freq: Once | INTRADERMAL | Status: AC
  Administered 2013-05-19: 100 ug via INTRADERMAL
  Filled 2013-05-19: qty 0.01

## 2013-05-19 MED ORDER — SODIUM CHLORIDE 0.9 % IJ SOLN
10.0000 mL | INTRAMUSCULAR | Status: DC | PRN
  Administered 2013-05-19: 10 mL via INTRAVENOUS
  Filled 2013-05-19: qty 10

## 2013-05-19 MED ORDER — GABAPENTIN 100 MG PO CAPS: 200.0000 mg | ORAL_CAPSULE | Freq: Three times a day (TID) | ORAL | Status: DC

## 2013-05-19 MED ORDER — SODIUM CHLORIDE 0.9 % IV SOLN
420.0000 mg | Freq: Once | INTRAVENOUS | Status: AC
  Administered 2013-05-19: 420 mg via INTRAVENOUS
  Filled 2013-05-19: qty 42

## 2013-05-19 MED ORDER — DEXAMETHASONE SODIUM PHOSPHATE 20 MG/5ML IJ SOLN
INTRAMUSCULAR | Status: AC
  Filled 2013-05-19: qty 5

## 2013-05-19 MED ORDER — SODIUM CHLORIDE 0.9 % IJ SOLN
10.0000 mL | INTRAMUSCULAR | Status: DC | PRN
  Administered 2013-05-19: 10 mL
  Filled 2013-05-19: qty 10

## 2013-05-19 MED ORDER — ONDANSETRON 16 MG/50ML IVPB (CHCC)
16.0000 mg | Freq: Once | INTRAVENOUS | Status: AC
  Administered 2013-05-19: 16 mg via INTRAVENOUS

## 2013-05-19 MED ORDER — SODIUM CHLORIDE 0.9 % IV SOLN: 501.5000 mg | Freq: Once | INTRAVENOUS | Status: DC

## 2013-05-19 MED ORDER — HEPARIN SOD (PORK) LOCK FLUSH 100 UNIT/ML IV SOLN
500.0000 [IU] | Freq: Once | INTRAVENOUS | Status: AC
  Administered 2013-05-19: 500 [IU] via INTRAVENOUS
  Filled 2013-05-19: qty 5

## 2013-05-19 MED ORDER — ONDANSETRON 16 MG/50ML IVPB (CHCC)
INTRAVENOUS | Status: AC
  Filled 2013-05-19: qty 16

## 2013-05-19 MED ORDER — HEPARIN SOD (PORK) LOCK FLUSH 100 UNIT/ML IV SOLN
500.0000 [IU] | Freq: Once | INTRAVENOUS | Status: AC | PRN
  Administered 2013-05-19: 500 [IU]
  Filled 2013-05-19: qty 5

## 2013-05-19 MED ORDER — PACLITAXEL PROTEIN-BOUND CHEMO INJECTION 100 MG
80.0000 mg/m2 | Freq: Once | INTRAVENOUS | Status: AC
  Administered 2013-05-19: 150 mg via INTRAVENOUS
  Filled 2013-05-19: qty 30

## 2013-05-19 MED ORDER — DEXAMETHASONE SODIUM PHOSPHATE 20 MG/5ML IJ SOLN
20.0000 mg | Freq: Once | INTRAMUSCULAR | Status: AC
  Administered 2013-05-19: 20 mg via INTRAVENOUS

## 2013-05-19 NOTE — Patient Instructions (Signed)
[place] Discharge Instructions for Patients Receiving Chemotherapy  Today you received the following chemotherapy agents carboplatin, abraxane  To help prevent nausea and vomiting after your treatment, we encourage you to take your nausea medication if needed   If you develop nausea and vomiting that is not controlled by your nausea medication, call the clinic.   BELOW ARE SYMPTOMS THAT SHOULD BE REPORTED IMMEDIATELY:  *FEVER GREATER THAN 100.5 F  *CHILLS WITH OR WITHOUT FEVER  NAUSEA AND VOMITING THAT IS NOT CONTROLLED WITH YOUR NAUSEA MEDICATION  *UNUSUAL SHORTNESS OF BREATH  *UNUSUAL BRUISING OR BLEEDING  TENDERNESS IN MOUTH AND THROAT WITH OR WITHOUT PRESENCE OF ULCERS  *URINARY PROBLEMS  *BOWEL PROBLEMS  UNUSUAL RASH Items with * indicate a potential emergency and should be followed up as soon as possible.  Feel free to call the clinic you have any questions or concerns. The clinic phone number is [phone].

## 2013-05-19 NOTE — Progress Notes (Signed)
Carbo test dose neg at 5 15 and 30 minutes.

## 2013-05-19 NOTE — Progress Notes (Signed)
[facility] [phone]   [phone]  OFFICE VISIT PROGRESS NOTE  Samuel Schwalbe, MD Samuel [facility], [place] [id number]  DIAGNOSIS AND STAGE:  1) Poorly differentiated non-small cell lung cancer diagnosed in December of 2014 2) Extensive stage small cell lung cancer diagnosed in July 2014.  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 4)  History of oncocytic renal tumor status post radiofrequency ablation at [facility] in August of 2008.  PRIOR THERAPY: Systemic chemotherapy with carboplatin for AUC of 5 on day 1 and etoposide 120 mg/M2 on days 1, 2 and 3 with Neulasta support on day 4, status post 4 cycles. First dose was given on 08/18/2012.    CURRENT THERAPY: Systemic chemotherapy with carboplatin for AUC of 5 on day 1 and Abraxane 80 mg/M2 on days 1, 8 and 15 every 3 weeks, status post 3 cycles.  INTERVAL HISTORY: Samuel Hurley 79 y.o. male returns to the clinic today for followup visit accompanied by his wife Samuel Hurley.   On 01/29/2013, The patient is feeling much better today with no specific complaints except for the occasional right-sided chest pain and this is well-controlled with his current pain medication with fentanyl patch 50 mcg/hour every 3 days in addition to Percocet on as-needed basis. He takes around 2-3 Percocet on daily basis for breakthrough pain. His shortness of breath is much improved. The patient continues to have drainage from the Pleuryx catheter but this is down to less than 50 cc every 3 days and he is expected to have his Pleurx catheter removed and few weeks by Dr. Kerin Hurley at [facility]. He recently underwent biopsy of one of the right pleural-based nodule at [facility] and  the final pathology was consistent with poorly differentiated non-small cell carcinoma. The tumor cells are of small size with hyperchromatic nuclei and some molded forms. Many of the cells have abundant cytoplasm and nuclei with prominent nucleoli. Strong immunoreactivity is observed for anti-keratins and CK5/6, with negative staining observed for calretinin, WT-1, CK7, desmin, TTF-1 , Fli-1, CD99 and CD56. The entirety of these findings is most consistent with non-small carcinoma, with immunohistochemical evidence of rudimentary squamous differentiation. Bright pankeratin/ CK5/6 immunoreactivity with negative staining for TTF-1 and CD56 is not typical of small cell carcinoma.  He was seen recently by Dr. Sharlet Hurley who sent him to be treated locally for the recently diagnosed non-small cell carcinoma as the patient is not a candidate for the clinical trial with Ipilumomab and Nivolumab for the second line option for the previously diagnosed small cell lung cancer. The patient was also seen recently at [facility] by Dr. Lewanda Hurley who discussed within palliative systemic chemotherapy for his condition. The patient is not accepting the fact that he has an incurable condition and he still shopping around for other treatment options. He also requested a referral to the [facility]. He came today for evaluation and discussion of his treatment options after the recent diagnosis of non-small cell carcinoma.  On 02/21/2013, the patient present today for evaluation of his condition. He was seen by her medical oncologist in the last few weeks including Dr. Harlow Asa at [facility] in [facility] as well as a thoracic  oncologist at [facility] in [place]. The visit records from Dr. Harlow Asa are currently available to me and he is in agreement with the current evaluation of [name] regarding dealing with 2 different lung cancer including  small cell lung cancer which was treated with carboplatin and etoposide in addition to recently diagnosed poorly differentiated non-small cell carcinoma. He also concurs with the treatment options provided for [name] including treatment with carboplatin and Abraxane. The patient is here today for evaluation and consideration of starting treatment for his newly diagnosed poorly differentiated non-small cell lung cancer. He is feeling much better today with no specific complaints except for mild fatigue. His Pleurx catheter drains less than 75 cc of pleural fluid every 3 days. The molecular biomarkers performed at [facility] were negative for EGFR mutation and the ALK gene translocation.  On 03/17/2013, the patient is here today for evaluation accompanied by his wife. He tolerated the first cycle of his systemic chemotherapy with carboplatin and Abraxane fairly well with no significant adverse effects. He denied having any significant nausea or vomiting, no fever or chills. He lost 1 pound since his last visit. He has good appetite but concerned about dysphagia from his previous head and neck cancer. I discussed with referred to gastroenterology for evaluation and consideration of PEG tube placement if needed but the patient declined and mentions that he is feeling okay for now. Pleurx catheter was removed yesterday by Dr. Kerin Hurley. He was supposed to start cycle #2 today but has low platelets count.  On 04/14/2013, The patient has been complaining of increasing fatigue and weakness as well as lack of appetite and dysphagia secondary to prior history of head and neck cancer. He was recently admitted to [facility] for treatment of dehydration and failure to thrive. During his admission he underwent PEG tube placement and he is currently receiving supplemental nutrition through the PEG tube. He is able to maintain his weight well.  The patient is too weak today to resume systemic chemotherapy. I  will keep his treatment on hold for now. I would repeat CT scan of the chest, abdomen and pelvis for restaging of his disease before resuming her systemic therapy and if no improvement the patient may be considered for palliative care and hospice referral.  For the chemotherapy-induced anemia, I will arrange for the patient to receive 2 units of PRBCs transfusion.  On 04/25/2013 The patient came to the clinic today accompanied by his wife for routine followup visit. He is feeling much better after receiving 2 units of PRBCs transfusion. He has more energy. He also eats a little bit better with supplemental nutrition. He continues to have mild fatigue. He denied having any significant chest pain, shortness of breath, cough or hemoptysis. He had repeat CT scan of the chest, abdomen and pelvis and he is here for evaluation and discussion of his scan results.  On 05/19/2013, the patient is here today for evaluation. He tolerated the last cycle of his systemic chemotherapy with carboplatin and Abraxane fairly well except for increasing peripheral neuropathy mainly in the fingers. He has back pain recently and would like to have referral to see Dr. Sharol Given for evaluation. He denied having any significant fever or chills, no nausea or vomiting. He denied having any significant chest pain, shortness breath, cough or hemoptysis.   MEDICAL HISTORY: Past Medical History  Diagnosis Date  . Hyperlipidemia   . OSA (obstructive sleep apnea)   . BPH (benign prostatic  hypertrophy)   . Kidney disease     TCC  . Xerostomia   . Pharynx cancer     squamous cell stage 4,s/p XRT,chemo, neck dissection  . Colon polyp     HYPERPLASTIC & TUBULAR ADENOMA(Colonoscopy-Dr.Longfellow)  . Diverticulosis of colon (without mention of hemorrhage)     (Colonoscopy-Dr.Valentine)  . Internal hemorrhoids without mention of complication     (Colonoscopy-Dr.Hayden)  . GERD (gastroesophageal reflux disease)     (EGD-Dr. Velora Heckler)  .  Atrophic gastritis without mention of hemorrhage     (EGD-Dr. Velora Heckler)  . History of radiation therapy   . Small cell lung cancer   . Non-small cell lung cancer     ALLERGIES:  is allergic to erythromycin and tetracycline.  MEDICATIONS:  Current Outpatient Prescriptions  Medication Sig Dispense Refill  . docusate sodium 100 MG CAPS Take 200 mg by mouth 2 (two) times daily.  10 capsule  0  . fentaNYL (DURAGESIC - DOSED MCG/HR) 50 MCG/HR Place 1 patch (50 mcg total) onto the skin every 3 (three) days.  10 patch  0  . finasteride (PROSCAR) 5 MG tablet Take 5 mg by mouth at bedtime.       . gabapentin (NEURONTIN) 100 MG capsule Take 1 capsule (100 mg total) by mouth 3 (three) times daily.  90 capsule  3  . levothyroxine (SYNTHROID, LEVOTHROID) 75 MCG tablet Take 75 mcg by mouth daily before breakfast.       . LORazepam (ATIVAN) 1 MG tablet Take 1 tablet (1 mg total) by mouth every 8 (eight) hours as needed for anxiety.  30 tablet  0  . mirtazapine (REMERON) 30 MG tablet TAKE ONE TABLET AT BEDTIME.  30 tablet  0  . mometasone (NASONEX) 50 MCG/ACT nasal spray Place 2 sprays into the nose daily.      . Nutritional Supplements (FEEDING SUPPLEMENT, JEVITY 1.2 CAL,) LIQD Place 1,000 mLs into feeding tube every 6 (six) hours.  1000 mL  0  . oxyCODONE (OXY IR/ROXICODONE) 5 MG immediate release tablet Take 1 tablet (5 mg total) by mouth every 4 (four) hours as needed for moderate pain or severe pain.  30 tablet  0  . terazosin (HYTRIN) 1 MG capsule Take 1 mg by mouth 2 (two) times daily.       Marland Kitchen acetaminophen (TYLENOL) 500 MG tablet Take 500 mg by mouth every 4 (four) hours as needed (Pain).       Marland Kitchen antiseptic oral rinse (BIOTENE) LIQD 15 mLs by Mouth Rinse route as needed for dry mouth.      . ondansetron (ZOFRAN) 4 MG tablet Take 1 tablet (4 mg total) by mouth every 8 (eight) hours as needed for nausea or vomiting.  90 tablet  2   No current facility-administered medications for this visit.     SURGICAL HISTORY:  Past Surgical History  Procedure Laterality Date  . Appendectomy    . Lumbar laminectomy    . Tonsillectomy    . Bilateral ganglionectomies    . Radical right neck dissection  2008  . Pci-rfa renal cell (tcc) cancer  2009    pt denies  . Cholecystectomy  dec. 2009  . Septoplasty  03/01/12    with double turbinectomy  . Video bronchoscopy Bilateral 08/10/2012    Procedure: VIDEO BRONCHOSCOPY WITH FLUORO;  Surgeon: Rigoberto Noel, MD;  Location: [place];  Service: Cardiopulmonary;  Laterality: Bilateral;  . Shoulder arthroscopy Left 2013  . Thoracentesis Right 10/2012  REVIEW OF SYSTEMS:  Constitutional: negative Eyes: negative Ears, nose, mouth, throat, and face: negative Respiratory: positive for pleurisy/chest pain Cardiovascular: negative Gastrointestinal: positive for dysphagia Genitourinary:negative Integument/breast: negative Hematologic/lymphatic: negative Musculoskeletal:positive for back pain Neurological: negative Behavioral/Psych: positive for anxiety, bad mood, depression, irritability and mood swings Endocrine: negative Allergic/Immunologic: negative   PHYSICAL EXAMINATION: General appearance: alert, cooperative and no distress Head: Normocephalic, without obvious abnormality, atraumatic Neck: no adenopathy and supple, symmetrical, trachea midline Lymph nodes: Cervical, supraclavicular, and axillary nodes normal. Resp: clear to auscultation bilaterally Back: symmetric, no curvature. ROM normal. No CVA tenderness. Cardio: regular rate and rhythm, S1, S2 normal, no murmur, click, rub or gallop GI: soft, non-tender; bowel sounds normal; no masses,  no organomegaly Extremities: extremities normal, atraumatic, no cyanosis or edema Neurologic: Alert and oriented X 3, normal strength and tone. Normal symmetric reflexes. Normal coordination and gait   ECOG PERFORMANCE STATUS: 1 - Symptomatic but completely ambulatory  Blood pressure  124/61, pulse 68, temperature 98.1 F (36.7 C), temperature source Oral, resp. rate 18, height 6' (1.829 m), weight 167 lb 9.6 oz (76.023 kg), SpO2 100.00%.  LABORATORY DATA: Lab Results  Component Value Date   WBC 3.2* 05/19/2013   HGB 9.6* 05/19/2013   HCT 29.3* 05/19/2013   MCV 91.0 05/19/2013   PLT 120* 05/19/2013      Chemistry      Component Value Date/Time   NA 142 05/19/2013 0924   NA 131* 04/05/2013 0545   K 4.0 05/19/2013 0924   K 3.8 04/05/2013 0545   CL 93* 04/05/2013 0545   CO2 25 05/19/2013 0924   CO2 27 04/05/2013 0545   BUN 21.3 05/19/2013 0924   BUN 8 04/05/2013 0545   CREATININE 0.8 05/19/2013 0924   CREATININE 0.79 04/05/2013 0545      Component Value Date/Time   CALCIUM 9.0 05/19/2013 0924   CALCIUM 8.3* 04/05/2013 0545   ALKPHOS 75 05/19/2013 0924   ALKPHOS 76 05/13/2012 1005   AST 12 05/19/2013 0924   AST 16 05/13/2012 1005   ALT 8 05/19/2013 0924   ALT 11 05/13/2012 1005   BILITOT <0.20 05/19/2013 0924   BILITOT 0.5 05/13/2012 1005       RADIOGRAPHIC STUDIES:  ASSESSMENT AND PLAN: This is another very extensive and long visit with the patient and his wife today. The patient is a 28 years old white male who was diagnosed with extensive stage small cell lung cancer status post systemic chemotherapy with carboplatin and etoposide with progressive disease on the right side of the chest. He was recently seen at [facility] by Dr. Sharlet Hurley and was considered for a clinical trial with Ipilumomab and Nivolumab.  Recent repeat biopsy of a right pleural based nodule showed poorly differentiated non-small cell carcinoma. He is currently undergoing systemic chemotherapy with carboplatin and Abraxane status post 3 cycles. He tolerated the chemotherapy fairly well except for increasing fatigue secondary to chemotherapy-induced anemia as well as thrombocytopenia. The patient is feeling fine today with no specific complaints. We will proceed with cycle #4  today as scheduled. He would come back for followup visit in 3 weeks for evaluation and management any adverse effect of his treatment. For the pain management, the patient will continue on his current pain medication with fentanyl patch and Percocet. For the back pain, I will refer the patient to Dr. Sharol Given for evaluation based on his request. For the peripheral neuropathy, I would reduce the dose of Abraxane to 80 mg/M2 and  I also increased his Neurontin dose to 200 mg by mouth 3 times a day. He was advised to call immediately if he has any concerning symptoms in the interval. The patient voices understanding of current disease status and treatment options and is in agreement with the current care plan.  All questions were answered. The patient knows to call the clinic with any problems, questions or concerns. We can certainly see the patient much sooner if necessary. I spent 15 minutes on face-to-face counseling with the patient and his wife today of the total visit time of 25 minutes.  Disclaimer: This note was dictated with voice recognition software. Similar sounding words can inadvertently be transcribed and may not be corrected upon review.  Curt Bears, MD 05/19/2013

## 2013-05-19 NOTE — Telephone Encounter (Signed)
add to previous note. wife will contact dr white (primary [facility] provider) for referral to see dr. Sharol Given (pt established w/dr duda). wife has may schedule.

## 2013-05-23 ENCOUNTER — Other Ambulatory Visit: Payer: Self-pay | Admitting: *Deleted

## 2013-05-23 DIAGNOSIS — C3490 Malignant neoplasm of unspecified part of unspecified bronchus or lung: Secondary | ICD-10-CM

## 2013-05-23 MED ORDER — FENTANYL 50 MCG/HR TD PT72: 50.0000 ug | MEDICATED_PATCH | TRANSDERMAL | Status: DC

## 2013-05-23 MED ORDER — LORAZEPAM 1 MG PO TABS: 1.0000 mg | ORAL_TABLET | Freq: Three times a day (TID) | ORAL | Status: DC | PRN

## 2013-05-23 MED ORDER — OXYCODONE HCL 5 MG PO TABS
5.0000 mg | ORAL_TABLET | ORAL | Status: DC | PRN
Start: 2013-05-23 — End: 2013-06-02

## 2013-05-23 NOTE — Telephone Encounter (Signed)
Pt's wife called wanting refills on pain medication and to know results of TSH level.  Called back and left a message, Informed her of TSH level.  SLJ

## 2013-05-26 ENCOUNTER — Other Ambulatory Visit (HOSPITAL_BASED_OUTPATIENT_CLINIC_OR_DEPARTMENT_OTHER): Payer: Commercial Managed Care - HMO

## 2013-05-26 ENCOUNTER — Ambulatory Visit (HOSPITAL_BASED_OUTPATIENT_CLINIC_OR_DEPARTMENT_OTHER): Payer: Commercial Managed Care - HMO

## 2013-05-26 ENCOUNTER — Ambulatory Visit: Payer: Commercial Managed Care - HMO

## 2013-05-26 VITALS — BP 115/54 | HR 77 | Temp 98.5°F | Resp 18

## 2013-05-26 DIAGNOSIS — C342 Malignant neoplasm of middle lobe, bronchus or lung: Secondary | ICD-10-CM

## 2013-05-26 DIAGNOSIS — Z5111 Encounter for antineoplastic chemotherapy: Secondary | ICD-10-CM

## 2013-05-26 DIAGNOSIS — C3491 Malignant neoplasm of unspecified part of right bronchus or lung: Secondary | ICD-10-CM

## 2013-05-26 DIAGNOSIS — Z95828 Presence of other vascular implants and grafts: Secondary | ICD-10-CM

## 2013-05-26 LAB — CBC WITH DIFFERENTIAL/PLATELET
BASO%: 0.3 % (ref 0.0–2.0)
BASOS ABS: 0 10*3/uL (ref 0.0–0.1)
EOS%: 1.8 % (ref 0.0–7.0)
Eosinophils Absolute: 0.1 10*3/uL (ref 0.0–0.5)
HCT: 28 % — ABNORMAL LOW (ref 38.4–49.9)
HGB: 9 g/dL — ABNORMAL LOW (ref 13.0–17.1)
LYMPH%: 12.5 % — AB (ref 14.0–49.0)
MCH: 29.6 pg (ref 27.2–33.4)
MCHC: 32.1 g/dL (ref 32.0–36.0)
MCV: 92.3 fL (ref 79.3–98.0)
MONO#: 0.6 10*3/uL (ref 0.1–0.9)
MONO%: 13.8 % (ref 0.0–14.0)
NEUT#: 2.9 10*3/uL (ref 1.5–6.5)
NEUT%: 71.6 % (ref 39.0–75.0)
PLATELETS: 118 10*3/uL — AB (ref 140–400)
RBC: 3.03 10*6/uL — ABNORMAL LOW (ref 4.20–5.82)
RDW: 18.8 % — AB (ref 11.0–14.6)
WBC: 4.1 10*3/uL (ref 4.0–10.3)
lymph#: 0.5 10*3/uL — ABNORMAL LOW (ref 0.9–3.3)

## 2013-05-26 LAB — COMPREHENSIVE METABOLIC PANEL (CC13)
ALBUMIN: 2.5 g/dL — AB (ref 3.5–5.0)
ALK PHOS: 72 U/L (ref 40–150)
ALT: 8 U/L (ref 0–55)
AST: 15 U/L (ref 5–34)
Anion Gap: 10 mEq/L (ref 3–11)
BUN: 25.7 mg/dL (ref 7.0–26.0)
CO2: 26 mEq/L (ref 22–29)
Calcium: 9 mg/dL (ref 8.4–10.4)
Chloride: 110 mEq/L — ABNORMAL HIGH (ref 98–109)
Creatinine: 0.8 mg/dL (ref 0.7–1.3)
GLUCOSE: 112 mg/dL (ref 70–140)
POTASSIUM: 3.8 meq/L (ref 3.5–5.1)
SODIUM: 146 meq/L — AB (ref 136–145)
TOTAL PROTEIN: 6.2 g/dL — AB (ref 6.4–8.3)
Total Bilirubin: <0.2 mg/dL (ref 0.20–1.20)

## 2013-05-26 MED ORDER — ONDANSETRON 8 MG/NS 50 ML IVPB
INTRAVENOUS | Status: AC
  Filled 2013-05-26: qty 8

## 2013-05-26 MED ORDER — SODIUM CHLORIDE 0.9 % IV SOLN
Freq: Once | INTRAVENOUS | Status: AC
  Administered 2013-05-26: 15:00:00 via INTRAVENOUS

## 2013-05-26 MED ORDER — ONDANSETRON 8 MG/50ML IVPB (CHCC)
8.0000 mg | Freq: Once | INTRAVENOUS | Status: AC
  Administered 2013-05-26: 8 mg via INTRAVENOUS

## 2013-05-26 MED ORDER — SODIUM CHLORIDE 0.9 % IJ SOLN
10.0000 mL | INTRAMUSCULAR | Status: DC | PRN
  Administered 2013-05-26: 10 mL via INTRAVENOUS
  Filled 2013-05-26: qty 10

## 2013-05-26 MED ORDER — SODIUM CHLORIDE 0.9 % IJ SOLN
10.0000 mL | INTRAMUSCULAR | Status: DC | PRN
  Administered 2013-05-26: 10 mL
  Filled 2013-05-26: qty 10

## 2013-05-26 MED ORDER — PACLITAXEL PROTEIN-BOUND CHEMO INJECTION 100 MG
80.0000 mg/m2 | Freq: Once | INTRAVENOUS | Status: AC
  Administered 2013-05-26: 150 mg via INTRAVENOUS
  Filled 2013-05-26: qty 30

## 2013-05-26 MED ORDER — DEXAMETHASONE SODIUM PHOSPHATE 10 MG/ML IJ SOLN
INTRAMUSCULAR | Status: AC
  Filled 2013-05-26: qty 1

## 2013-05-26 MED ORDER — HEPARIN SOD (PORK) LOCK FLUSH 100 UNIT/ML IV SOLN
500.0000 [IU] | Freq: Once | INTRAVENOUS | Status: AC | PRN
  Administered 2013-05-26: 500 [IU]
  Filled 2013-05-26: qty 5

## 2013-05-26 MED ORDER — DEXAMETHASONE SODIUM PHOSPHATE 10 MG/ML IJ SOLN
10.0000 mg | Freq: Once | INTRAMUSCULAR | Status: AC
  Administered 2013-05-26: 10 mg via INTRAVENOUS

## 2013-05-26 NOTE — Patient Instructions (Signed)
[place] Discharge Instructions for Patients Receiving Chemotherapy  Today you received the following chemotherapy agents Abraxane.  To help prevent nausea and vomiting after your treatment, we encourage you to take your nausea medication.   If you develop nausea and vomiting that is not controlled by your nausea medication, call the clinic.   BELOW ARE SYMPTOMS THAT SHOULD BE REPORTED IMMEDIATELY:  *FEVER GREATER THAN 100.5 F  *CHILLS WITH OR WITHOUT FEVER  NAUSEA AND VOMITING THAT IS NOT CONTROLLED WITH YOUR NAUSEA MEDICATION  *UNUSUAL SHORTNESS OF BREATH  *UNUSUAL BRUISING OR BLEEDING  TENDERNESS IN MOUTH AND THROAT WITH OR WITHOUT PRESENCE OF ULCERS  *URINARY PROBLEMS  *BOWEL PROBLEMS  UNUSUAL RASH Items with * indicate a potential emergency and should be followed up as soon as possible.  Feel free to call the clinic you have any questions or concerns. The clinic phone number is [phone].

## 2013-05-26 NOTE — Patient Instructions (Signed)

## 2013-05-27 ENCOUNTER — Ambulatory Visit: Payer: Commercial Managed Care - HMO | Admitting: Physician Assistant

## 2013-05-30 ENCOUNTER — Telehealth: Payer: Self-pay | Admitting: Medical Oncology

## 2013-06-02 ENCOUNTER — Other Ambulatory Visit (HOSPITAL_BASED_OUTPATIENT_CLINIC_OR_DEPARTMENT_OTHER): Payer: Commercial Managed Care - HMO

## 2013-06-02 ENCOUNTER — Ambulatory Visit: Payer: Commercial Managed Care - HMO

## 2013-06-02 ENCOUNTER — Other Ambulatory Visit: Payer: Self-pay | Admitting: Medical Oncology

## 2013-06-02 ENCOUNTER — Ambulatory Visit (HOSPITAL_BASED_OUTPATIENT_CLINIC_OR_DEPARTMENT_OTHER): Payer: Commercial Managed Care - HMO

## 2013-06-02 ENCOUNTER — Ambulatory Visit (HOSPITAL_COMMUNITY)
Admission: RE | Admit: 2013-06-02 | Discharge: 2013-06-02 | Disposition: A | Discharge status: Home / Self Care | Payer: Medicare HMO | Source: Ambulatory Visit | Attending: Internal Medicine | Admitting: Internal Medicine

## 2013-06-02 VITALS — BP 141/60 | HR 83 | Temp 98.2°F | Resp 16

## 2013-06-02 DIAGNOSIS — Z95828 Presence of other vascular implants and grafts: Secondary | ICD-10-CM

## 2013-06-02 DIAGNOSIS — C3491 Malignant neoplasm of unspecified part of right bronchus or lung: Secondary | ICD-10-CM

## 2013-06-02 DIAGNOSIS — D649 Anemia, unspecified: Secondary | ICD-10-CM

## 2013-06-02 DIAGNOSIS — Z5111 Encounter for antineoplastic chemotherapy: Secondary | ICD-10-CM

## 2013-06-02 DIAGNOSIS — C3490 Malignant neoplasm of unspecified part of unspecified bronchus or lung: Secondary | ICD-10-CM

## 2013-06-02 LAB — COMPREHENSIVE METABOLIC PANEL (CC13)
ALBUMIN: 2.6 g/dL — AB (ref 3.5–5.0)
ALT: 11 U/L (ref 0–55)
AST: 15 U/L (ref 5–34)
Alkaline Phosphatase: 67 U/L (ref 40–150)
Anion Gap: 12 mEq/L — ABNORMAL HIGH (ref 3–11)
BUN: 26.7 mg/dL — ABNORMAL HIGH (ref 7.0–26.0)
CALCIUM: 8.8 mg/dL (ref 8.4–10.4)
CO2: 23 meq/L (ref 22–29)
CREATININE: 1 mg/dL (ref 0.7–1.3)
Chloride: 108 mEq/L (ref 98–109)
Glucose: 111 mg/dl (ref 70–140)
POTASSIUM: 3.8 meq/L (ref 3.5–5.1)
Sodium: 143 mEq/L (ref 136–145)
TOTAL PROTEIN: 6.1 g/dL — AB (ref 6.4–8.3)
Total Bilirubin: <0.2 mg/dL (ref 0.20–1.20)

## 2013-06-02 LAB — CBC WITH DIFFERENTIAL/PLATELET
BASO%: 0.7 % (ref 0.0–2.0)
Basophils Absolute: 0 10*3/uL (ref 0.0–0.1)
EOS%: 0.7 % (ref 0.0–7.0)
Eosinophils Absolute: 0 10*3/uL (ref 0.0–0.5)
HEMATOCRIT: 24.9 % — AB (ref 38.4–49.9)
HEMOGLOBIN: 8.2 g/dL — AB (ref 13.0–17.1)
LYMPH#: 0.4 10*3/uL — AB (ref 0.9–3.3)
LYMPH%: 9.5 % — ABNORMAL LOW (ref 14.0–49.0)
MCH: 30.3 pg (ref 27.2–33.4)
MCHC: 33 g/dL (ref 32.0–36.0)
MCV: 91.9 fL (ref 79.3–98.0)
MONO#: 0.6 10*3/uL (ref 0.1–0.9)
MONO%: 14.7 % — ABNORMAL HIGH (ref 0.0–14.0)
NEUT#: 2.9 10*3/uL (ref 1.5–6.5)
NEUT%: 74.4 % (ref 39.0–75.0)
Platelets: 115 10*3/uL — ABNORMAL LOW (ref 140–400)
RBC: 2.71 10*6/uL — ABNORMAL LOW (ref 4.20–5.82)
RDW: 18.9 % — ABNORMAL HIGH (ref 11.0–14.6)
WBC: 3.9 10*3/uL — ABNORMAL LOW (ref 4.0–10.3)

## 2013-06-02 MED ORDER — OXYCODONE HCL 5 MG PO TABS: 5.0000 mg | ORAL_TABLET | ORAL | Status: DC | PRN

## 2013-06-02 MED ORDER — SODIUM CHLORIDE 0.9 % IJ SOLN
10.0000 mL | INTRAMUSCULAR | Status: DC | PRN
  Administered 2013-06-02: 10 mL via INTRAVENOUS
  Filled 2013-06-02: qty 10

## 2013-06-02 MED ORDER — HEPARIN SOD (PORK) LOCK FLUSH 100 UNIT/ML IV SOLN
500.0000 [IU] | Freq: Once | INTRAVENOUS | Status: AC
  Administered 2013-06-02: 500 [IU] via INTRAVENOUS
  Filled 2013-06-02: qty 5

## 2013-06-02 MED ORDER — SODIUM CHLORIDE 0.9 % IV SOLN
Freq: Once | INTRAVENOUS | Status: AC
  Administered 2013-06-02: 15:00:00 via INTRAVENOUS

## 2013-06-02 MED ORDER — DEXAMETHASONE SODIUM PHOSPHATE 10 MG/ML IJ SOLN
INTRAMUSCULAR | Status: AC
  Filled 2013-06-02: qty 1

## 2013-06-02 MED ORDER — ONDANSETRON 8 MG/NS 50 ML IVPB
INTRAVENOUS | Status: AC
  Filled 2013-06-02: qty 8

## 2013-06-02 MED ORDER — ONDANSETRON 8 MG/50ML IVPB (CHCC)
8.0000 mg | Freq: Once | INTRAVENOUS | Status: AC
  Administered 2013-06-02: 8 mg via INTRAVENOUS

## 2013-06-02 MED ORDER — PACLITAXEL PROTEIN-BOUND CHEMO INJECTION 100 MG
80.0000 mg/m2 | Freq: Once | INTRAVENOUS | Status: AC
Start: 2013-06-02 — End: 2013-06-02
  Administered 2013-06-02: 150 mg via INTRAVENOUS
  Filled 2013-06-02: qty 30

## 2013-06-02 MED ORDER — HEPARIN SOD (PORK) LOCK FLUSH 100 UNIT/ML IV SOLN
500.0000 [IU] | Freq: Once | INTRAVENOUS | Status: AC | PRN
  Administered 2013-06-02: 500 [IU]
  Filled 2013-06-02: qty 5

## 2013-06-02 MED ORDER — SODIUM CHLORIDE 0.9 % IJ SOLN
10.0000 mL | INTRAMUSCULAR | Status: DC | PRN
  Administered 2013-06-02: 10 mL
  Filled 2013-06-02: qty 10

## 2013-06-02 MED ORDER — DEXAMETHASONE SODIUM PHOSPHATE 10 MG/ML IJ SOLN
10.0000 mg | Freq: Once | INTRAMUSCULAR | Status: AC
  Administered 2013-06-02: 10 mg via INTRAVENOUS

## 2013-06-02 NOTE — Progress Notes (Signed)
Ok to treat without waiting for CMET per dr Julien Nordmann

## 2013-06-02 NOTE — Patient Instructions (Signed)
[place] Discharge Instructions for Patients Receiving Chemotherapy  Today you received the following chemotherapy agents abraxane.   To help prevent nausea and vomiting after your treatment, we encourage you to take your nausea medication as directed.    If you develop nausea and vomiting that is not controlled by your nausea medication, call the clinic.   BELOW ARE SYMPTOMS THAT SHOULD BE REPORTED IMMEDIATELY:  *FEVER GREATER THAN 100.5 F  *CHILLS WITH OR WITHOUT FEVER  NAUSEA AND VOMITING THAT IS NOT CONTROLLED WITH YOUR NAUSEA MEDICATION  *UNUSUAL SHORTNESS OF BREATH  *UNUSUAL BRUISING OR BLEEDING  TENDERNESS IN MOUTH AND THROAT WITH OR WITHOUT PRESENCE OF ULCERS  *URINARY PROBLEMS  *BOWEL PROBLEMS  UNUSUAL RASH Items with * indicate a potential emergency and should be followed up as soon as possible.  Feel free to call the clinic you have any questions or concerns. The clinic phone number is [phone].

## 2013-06-02 NOTE — Telephone Encounter (Signed)
Wife calls requesting stronger pain med , "not patches". Per Dr Julien Nordmann rx refilled.

## 2013-06-02 NOTE — Progress Notes (Signed)
Har called for and ONC TX request sent

## 2013-06-03 ENCOUNTER — Telehealth: Payer: Self-pay | Admitting: *Deleted

## 2013-06-03 NOTE — Telephone Encounter (Signed)
Per note from chemo RN I scheduled appt. I have called and gave the spouse the appt

## 2013-06-04 ENCOUNTER — Ambulatory Visit (HOSPITAL_BASED_OUTPATIENT_CLINIC_OR_DEPARTMENT_OTHER): Payer: Commercial Managed Care - HMO

## 2013-06-04 VITALS — BP 104/56 | HR 63 | Temp 98.2°F | Resp 18

## 2013-06-04 DIAGNOSIS — D649 Anemia, unspecified: Secondary | ICD-10-CM

## 2013-06-04 LAB — PREPARE RBC (CROSSMATCH)

## 2013-06-04 MED ORDER — HEPARIN SOD (PORK) LOCK FLUSH 100 UNIT/ML IV SOLN
500.0000 [IU] | Freq: Every day | INTRAVENOUS | Status: AC | PRN
  Administered 2013-06-04: 500 [IU]
  Filled 2013-06-04: qty 5

## 2013-06-04 MED ORDER — DIPHENHYDRAMINE HCL 25 MG PO CAPS
ORAL_CAPSULE | ORAL | Status: AC
  Filled 2013-06-04: qty 1

## 2013-06-04 MED ORDER — DIPHENHYDRAMINE HCL 25 MG PO CAPS
25.0000 mg | ORAL_CAPSULE | Freq: Once | ORAL | Status: AC
  Administered 2013-06-04: 25 mg via ORAL

## 2013-06-04 MED ORDER — SODIUM CHLORIDE 0.9 % IV SOLN
250.0000 mL | Freq: Once | INTRAVENOUS | Status: AC
  Administered 2013-06-04: 250 mL via INTRAVENOUS

## 2013-06-04 MED ORDER — ACETAMINOPHEN 325 MG PO TABS
650.0000 mg | ORAL_TABLET | Freq: Once | ORAL | Status: AC
  Administered 2013-06-04: 650 mg via ORAL

## 2013-06-04 MED ORDER — ACETAMINOPHEN 325 MG PO TABS
ORAL_TABLET | ORAL | Status: AC
  Filled 2013-06-04: qty 2

## 2013-06-04 MED ORDER — SODIUM CHLORIDE 0.9 % IJ SOLN
10.0000 mL | INTRAMUSCULAR | Status: AC | PRN
  Administered 2013-06-04: 10 mL
  Filled 2013-06-04: qty 10

## 2013-06-04 NOTE — Patient Instructions (Signed)
Blood Transfusion Information WHAT IS A BLOOD TRANSFUSION? A transfusion is the replacement of blood or some of its parts. Blood is made up of multiple cells which provide different functions.  Red blood cells carry oxygen and are used for blood loss replacement.  White blood cells fight against infection.  Platelets control bleeding.  Plasma helps clot blood.  Other blood products are available for specialized needs, such as hemophilia or other clotting disorders. BEFORE THE TRANSFUSION  Who gives blood for transfusions?   You may be able to donate blood to be used at a later date on yourself (autologous donation).  Relatives can be asked to donate blood. This is generally not any safer than if you have received blood from a stranger. The same precautions are taken to ensure safety when a relative's blood is donated.  Healthy volunteers who are fully evaluated to make sure their blood is safe. This is blood bank blood. Transfusion therapy is the safest it has ever been in the practice of medicine. Before blood is taken from a donor, a complete history is taken to make sure that person has no history of diseases nor engages in risky social behavior (examples are intravenous drug use or sexual activity with multiple partners). The donor's travel history is screened to minimize risk of transmitting infections, such as malaria. The donated blood is tested for signs of infectious diseases, such as HIV and hepatitis. The blood is then tested to be sure it is compatible with you in order to minimize the chance of a transfusion reaction. If you or a relative donates blood, this is often done in anticipation of surgery and is not appropriate for emergency situations. It takes many days to process the donated blood. RISKS AND COMPLICATIONS Although transfusion therapy is very safe and saves many lives, the main dangers of transfusion include:   Getting an infectious disease.  Developing a  transfusion reaction. This is an allergic reaction to something in the blood you were given. Every precaution is taken to prevent this. The decision to have a blood transfusion has been considered carefully by your caregiver before blood is given. Blood is not given unless the benefits outweigh the risks. AFTER THE TRANSFUSION  Right after receiving a blood transfusion, you will usually feel much better and more energetic. This is especially true if your red blood cells have gotten low (anemic). The transfusion raises the level of the red blood cells which carry oxygen, and this usually causes an energy increase.  The nurse administering the transfusion will monitor you carefully for complications. HOME CARE INSTRUCTIONS  No special instructions are needed after a transfusion. You may find your energy is better. Speak with your caregiver about any limitations on activity for underlying diseases you may have. SEEK MEDICAL CARE IF:   Your condition is not improving after your transfusion.  You develop redness or irritation at the intravenous (IV) site. SEEK IMMEDIATE MEDICAL CARE IF:  Any of the following symptoms occur over the next 12 hours:  Shaking chills.  You have a temperature by mouth above 102 F (38.9 C), not controlled by medicine.  Chest, back, or muscle pain.  People around you feel you are not acting correctly or are confused.  Shortness of breath or difficulty breathing.  Dizziness and fainting.  You get a rash or develop hives.  You have a decrease in urine output.  Your urine turns a dark color or changes to pink, red, or brown. Any of the following   symptoms occur over the next 10 days:  You have a temperature by mouth above 102 F (38.9 C), not controlled by medicine.  Shortness of breath.  Weakness after normal activity.  The white part of the eye turns yellow (jaundice).  You have a decrease in the amount of urine or are urinating less often.  Your  urine turns a dark color or changes to pink, red, or brown. Document Released: 01/04/2000 Document Revised: 03/31/2011 Document Reviewed: 08/23/2007 Exit[employer] Patient Information 2014 [employer].  

## 2013-06-06 LAB — TYPE AND SCREEN
ABO/RH(D): A POS
Antibody Screen: NEGATIVE
UNIT DIVISION: 0

## 2013-06-08 ENCOUNTER — Other Ambulatory Visit: Payer: Self-pay | Admitting: Internal Medicine

## 2013-06-09 ENCOUNTER — Telehealth: Payer: Self-pay | Admitting: *Deleted

## 2013-06-09 ENCOUNTER — Other Ambulatory Visit: Payer: Self-pay

## 2013-06-09 ENCOUNTER — Ambulatory Visit: Payer: Medicare HMO

## 2013-06-09 ENCOUNTER — Other Ambulatory Visit (HOSPITAL_BASED_OUTPATIENT_CLINIC_OR_DEPARTMENT_OTHER): Payer: Commercial Managed Care - HMO

## 2013-06-09 ENCOUNTER — Ambulatory Visit (HOSPITAL_BASED_OUTPATIENT_CLINIC_OR_DEPARTMENT_OTHER): Payer: Medicare HMO

## 2013-06-09 ENCOUNTER — Other Ambulatory Visit: Payer: Self-pay | Admitting: Physician Assistant

## 2013-06-09 ENCOUNTER — Ambulatory Visit: Payer: Medicare HMO | Admitting: Physician Assistant

## 2013-06-09 VITALS — BP 157/69 | HR 85 | Temp 99.1°F

## 2013-06-09 DIAGNOSIS — C3490 Malignant neoplasm of unspecified part of unspecified bronchus or lung: Secondary | ICD-10-CM

## 2013-06-09 DIAGNOSIS — Z95828 Presence of other vascular implants and grafts: Secondary | ICD-10-CM

## 2013-06-09 DIAGNOSIS — C3491 Malignant neoplasm of unspecified part of right bronchus or lung: Secondary | ICD-10-CM

## 2013-06-09 DIAGNOSIS — C342 Malignant neoplasm of middle lobe, bronchus or lung: Secondary | ICD-10-CM

## 2013-06-09 DIAGNOSIS — C384 Malignant neoplasm of pleura: Secondary | ICD-10-CM

## 2013-06-09 LAB — CBC WITH DIFFERENTIAL/PLATELET
BASO%: 0.7 % (ref 0.0–2.0)
Basophils Absolute: 0 10*3/uL (ref 0.0–0.1)
EOS ABS: 0 10*3/uL (ref 0.0–0.5)
EOS%: 0.9 % (ref 0.0–7.0)
HEMATOCRIT: 28.3 % — AB (ref 38.4–49.9)
HGB: 9.1 g/dL — ABNORMAL LOW (ref 13.0–17.1)
LYMPH%: 14.6 % (ref 14.0–49.0)
MCH: 30.2 pg (ref 27.2–33.4)
MCHC: 32.3 g/dL (ref 32.0–36.0)
MCV: 93.5 fL (ref 79.3–98.0)
MONO#: 0.7 10*3/uL (ref 0.1–0.9)
MONO%: 17.3 % — ABNORMAL HIGH (ref 0.0–14.0)
NEUT#: 2.6 10*3/uL (ref 1.5–6.5)
NEUT%: 66.5 % (ref 39.0–75.0)
PLATELETS: 125 10*3/uL — AB (ref 140–400)
RBC: 3.02 10*6/uL — ABNORMAL LOW (ref 4.20–5.82)
RDW: 19.1 % — ABNORMAL HIGH (ref 11.0–14.6)
WBC: 3.9 10*3/uL — ABNORMAL LOW (ref 4.0–10.3)
lymph#: 0.6 10*3/uL — ABNORMAL LOW (ref 0.9–3.3)

## 2013-06-09 LAB — COMPREHENSIVE METABOLIC PANEL (CC13)
ALT: 9 U/L (ref 0–55)
AST: 13 U/L (ref 5–34)
Albumin: 2.7 g/dL — ABNORMAL LOW (ref 3.5–5.0)
Alkaline Phosphatase: 71 U/L (ref 40–150)
Anion Gap: 9 mEq/L (ref 3–11)
BUN: 19.1 mg/dL (ref 7.0–26.0)
CO2: 25 mEq/L (ref 22–29)
Calcium: 8.8 mg/dL (ref 8.4–10.4)
Chloride: 106 mEq/L (ref 98–109)
Creatinine: 0.8 mg/dL (ref 0.7–1.3)
Glucose: 151 mg/dl — ABNORMAL HIGH (ref 70–140)
Potassium: 3.8 mEq/L (ref 3.5–5.1)
Sodium: 141 mEq/L (ref 136–145)
Total Bilirubin: 0.21 mg/dL (ref 0.20–1.20)
Total Protein: 6.4 g/dL (ref 6.4–8.3)

## 2013-06-09 MED ORDER — LORAZEPAM 1 MG PO TABS
1.0000 mg | ORAL_TABLET | Freq: Three times a day (TID) | ORAL | Status: DC | PRN
Start: 2013-06-09 — End: 2013-06-16

## 2013-06-09 MED ORDER — FENTANYL 50 MCG/HR TD PT72: 50.0000 ug | MEDICATED_PATCH | TRANSDERMAL | Status: DC

## 2013-06-09 MED ORDER — OXYCODONE HCL 5 MG PO TABS: 5.0000 mg | ORAL_TABLET | ORAL | Status: DC | PRN

## 2013-06-09 MED ORDER — SODIUM CHLORIDE 0.9 % IJ SOLN
10.0000 mL | INTRAMUSCULAR | Status: DC | PRN
  Administered 2013-06-09: 10 mL via INTRAVENOUS
  Filled 2013-06-09: qty 10

## 2013-06-09 MED ORDER — HEPARIN SOD (PORK) LOCK FLUSH 100 UNIT/ML IV SOLN
500.0000 [IU] | Freq: Once | INTRAVENOUS | Status: AC
  Administered 2013-06-09: 500 [IU] via INTRAVENOUS
  Filled 2013-06-09: qty 5

## 2013-06-09 NOTE — Telephone Encounter (Signed)
lmovm- patient missed appointment. Calling to check on him and reschedule. Request that patient call clinic and reschedule appointment.

## 2013-06-09 NOTE — Patient Instructions (Signed)

## 2013-06-10 ENCOUNTER — Ambulatory Visit (HOSPITAL_BASED_OUTPATIENT_CLINIC_OR_DEPARTMENT_OTHER): Payer: Commercial Managed Care - HMO

## 2013-06-10 ENCOUNTER — Ambulatory Visit (HOSPITAL_BASED_OUTPATIENT_CLINIC_OR_DEPARTMENT_OTHER): Payer: Commercial Managed Care - HMO | Admitting: Physician Assistant

## 2013-06-10 ENCOUNTER — Ambulatory Visit (HOSPITAL_COMMUNITY)
Admission: RE | Admit: 2013-06-10 | Discharge: 2013-06-10 | Disposition: A | Discharge status: Home / Self Care | Payer: Medicare HMO | Source: Ambulatory Visit | Attending: Physician Assistant | Admitting: Physician Assistant

## 2013-06-10 ENCOUNTER — Telehealth: Payer: Self-pay | Admitting: Internal Medicine

## 2013-06-10 ENCOUNTER — Encounter: Payer: Self-pay | Admitting: Physician Assistant

## 2013-06-10 VITALS — BP 137/59 | HR 71 | Temp 97.9°F | Resp 18 | Ht 72.0 in | Wt 170.3 lb

## 2013-06-10 DIAGNOSIS — D6959 Other secondary thrombocytopenia: Secondary | ICD-10-CM

## 2013-06-10 DIAGNOSIS — M25559 Pain in unspecified hip: Secondary | ICD-10-CM

## 2013-06-10 DIAGNOSIS — M79609 Pain in unspecified limb: Secondary | ICD-10-CM

## 2013-06-10 DIAGNOSIS — M79605 Pain in left leg: Secondary | ICD-10-CM

## 2013-06-10 DIAGNOSIS — C384 Malignant neoplasm of pleura: Secondary | ICD-10-CM

## 2013-06-10 DIAGNOSIS — C3491 Malignant neoplasm of unspecified part of right bronchus or lung: Secondary | ICD-10-CM

## 2013-06-10 DIAGNOSIS — C342 Malignant neoplasm of middle lobe, bronchus or lung: Secondary | ICD-10-CM | POA: Diagnosis not present

## 2013-06-10 DIAGNOSIS — M79604 Pain in right leg: Secondary | ICD-10-CM

## 2013-06-10 DIAGNOSIS — T451X5A Adverse effect of antineoplastic and immunosuppressive drugs, initial encounter: Secondary | ICD-10-CM

## 2013-06-10 DIAGNOSIS — D6481 Anemia due to antineoplastic chemotherapy: Secondary | ICD-10-CM

## 2013-06-10 DIAGNOSIS — Z5111 Encounter for antineoplastic chemotherapy: Secondary | ICD-10-CM

## 2013-06-10 DIAGNOSIS — C3490 Malignant neoplasm of unspecified part of unspecified bronchus or lung: Secondary | ICD-10-CM | POA: Insufficient documentation

## 2013-06-10 MED ORDER — SODIUM CHLORIDE 0.9 % IV SOLN
420.0000 mg | Freq: Once | INTRAVENOUS | Status: AC
  Administered 2013-06-10: 420 mg via INTRAVENOUS
  Filled 2013-06-10: qty 42

## 2013-06-10 MED ORDER — ONDANSETRON 16 MG/50ML IVPB (CHCC)
16.0000 mg | Freq: Once | INTRAVENOUS | Status: AC
  Administered 2013-06-10: 16 mg via INTRAVENOUS

## 2013-06-10 MED ORDER — DEXAMETHASONE SODIUM PHOSPHATE 20 MG/5ML IJ SOLN
20.0000 mg | Freq: Once | INTRAMUSCULAR | Status: AC
  Administered 2013-06-10: 20 mg via INTRAVENOUS

## 2013-06-10 MED ORDER — CARBOPLATIN CHEMO INTRADERMAL TEST DOSE 100MCG/0.02ML
100.0000 ug | Freq: Once | INTRADERMAL | Status: AC
  Administered 2013-06-10: 100 ug via INTRADERMAL
  Filled 2013-06-10: qty 0.02

## 2013-06-10 MED ORDER — SODIUM CHLORIDE 0.9 % IJ SOLN
10.0000 mL | INTRAMUSCULAR | Status: DC | PRN
  Administered 2013-06-10: 10 mL
  Filled 2013-06-10: qty 10

## 2013-06-10 MED ORDER — PACLITAXEL PROTEIN-BOUND CHEMO INJECTION 100 MG
80.0000 mg/m2 | Freq: Once | INTRAVENOUS | Status: AC
  Administered 2013-06-10: 150 mg via INTRAVENOUS
  Filled 2013-06-10: qty 30

## 2013-06-10 MED ORDER — ONDANSETRON 16 MG/50ML IVPB (CHCC)
INTRAVENOUS | Status: AC
  Filled 2013-06-10: qty 16

## 2013-06-10 MED ORDER — DEXAMETHASONE SODIUM PHOSPHATE 20 MG/5ML IJ SOLN
INTRAMUSCULAR | Status: AC
  Filled 2013-06-10: qty 5

## 2013-06-10 MED ORDER — HEPARIN SOD (PORK) LOCK FLUSH 100 UNIT/ML IV SOLN
500.0000 [IU] | Freq: Once | INTRAVENOUS | Status: AC | PRN
  Administered 2013-06-10: 500 [IU]
  Filled 2013-06-10: qty 5

## 2013-06-10 MED ORDER — SODIUM CHLORIDE 0.9 % IV SOLN
Freq: Once | INTRAVENOUS | Status: AC
  Administered 2013-06-10: 14:00:00 via INTRAVENOUS

## 2013-06-10 NOTE — Patient Instructions (Signed)
[facility] Discharge Instructions for Patients Receiving Chemotherapy  Today you received the following chemotherapy agents abraxane, carboplatin  To help prevent nausea and vomiting after your treatment, we encourage you to take your nausea medication if needed.   If you develop nausea and vomiting that is not controlled by your nausea medication, call the clinic.   BELOW ARE SYMPTOMS THAT SHOULD BE REPORTED IMMEDIATELY:  *FEVER GREATER THAN 100.5 F  *CHILLS WITH OR WITHOUT FEVER  NAUSEA AND VOMITING THAT IS NOT CONTROLLED WITH YOUR NAUSEA MEDICATION  *UNUSUAL SHORTNESS OF BREATH  *UNUSUAL BRUISING OR BLEEDING  TENDERNESS IN MOUTH AND THROAT WITH OR WITHOUT PRESENCE OF ULCERS  *URINARY PROBLEMS  *BOWEL PROBLEMS  UNUSUAL RASH Items with * indicate a potential emergency and should be followed up as soon as possible.  Feel free to call the clinic you have any questions or concerns. The clinic phone number is [phone].

## 2013-06-10 NOTE — Progress Notes (Signed)
Samuel Hurley [phone]   [phone]  OFFICE VISIT PROGRESS NOTE  Samuel Schwalbe, MD [place], [place] [address]  DIAGNOSIS AND STAGE:  1) Poorly differentiated non-small cell lung cancer diagnosed in December of 2014 2) Extensive stage small cell lung cancer diagnosed in July 2014.  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 4)  History of oncocytic renal tumor status post radiofrequency ablation at [facility] in August of 2008.  PRIOR THERAPY: Systemic chemotherapy with carboplatin for AUC of 5 on day 1 and etoposide 120 mg/M2 on days 1, 2 and 3 with Neulasta support on day 4, status post 4 cycles. First dose was given on 08/18/2012.    CURRENT THERAPY: Systemic chemotherapy with carboplatin for AUC of 5 on day 1 and Abraxane 80 mg/M2 on days 1, 8 and 15 every 3 weeks, status post 4 cycles.  INTERVAL HISTORY: Samuel Hurley 79 y.o. male returns to the clinic today for followup visit accompanied by his wife Samuel Hurley.   On 01/29/2013, The patient is feeling much better today with no specific complaints except for the occasional right-sided chest pain and this is well-controlled with his current pain medication with fentanyl patch 50 mcg/hour every 3 days in addition to Percocet on as-needed basis. He takes around 2-3 Percocet on daily basis for breakthrough pain. His shortness of breath is much improved. The patient continues to have drainage from the Pleuryx catheter but this is down to less than 50 cc every 3 days and he is expected to have his Pleurx catheter removed and few weeks by Dr. Kerin Ransom at [facility]. He recently underwent biopsy of one of the right pleural-based nodule at [facility] and  the final pathology was consistent with poorly differentiated non-small cell carcinoma. The tumor cells are of small size with hyperchromatic nuclei and some molded forms. Many of the cells have abundant cytoplasm and nuclei with prominent nucleoli. Strong immunoreactivity is observed for anti-keratins and CK5/6, with negative staining observed for calretinin, WT-1, CK7, desmin, TTF-1 , Fli-1, CD99 and CD56. The entirety of these findings is most consistent with non-small carcinoma, with immunohistochemical evidence of rudimentary squamous differentiation. Bright pankeratin/ CK5/6 immunoreactivity with negative staining for TTF-1 and CD56 is not typical of small cell carcinoma.  He was seen recently by Dr. Sharlet Salina who sent him to be treated locally for the recently diagnosed non-small cell carcinoma as the patient is not a candidate for the clinical trial with Ipilumomab and Nivolumab for the second line option for the previously diagnosed small cell lung cancer. The patient was also seen recently at [facility] by Dr. Lewanda Rife who discussed within palliative systemic chemotherapy for his condition. The patient is not accepting the fact that he has an incurable condition and he still shopping around for other treatment options. He also requested a referral to the [facility]. He came today for evaluation and discussion of his treatment options after the recent diagnosis of non-small cell carcinoma.  On 02/21/2013, the patient present today for evaluation of his condition. He was seen by her medical oncologist in the last few weeks including Dr. Harlow Asa at [facility] in [facility] as well as a thoracic  oncologist at [facility] in [place]. The visit records from Dr. Harlow Asa are currently available to me and he is in agreement with the current evaluation of Samuel Hurley regarding dealing with 2 different lung cancer including  small cell lung cancer which was treated with carboplatin and etoposide in addition to recently diagnosed poorly differentiated non-small cell carcinoma. He also concurs with the treatment options provided for Samuel Hurley including treatment with carboplatin and Abraxane. The patient is here today for evaluation and consideration of starting treatment for his newly diagnosed poorly differentiated non-small cell lung cancer. He is feeling much better today with no specific complaints except for mild fatigue. His Pleurx catheter drains less than 75 cc of pleural fluid every 3 days. The molecular biomarkers performed at [facility] were negative for EGFR mutation and the ALK gene translocation.  On 03/17/2013, the patient is here today for evaluation accompanied by his wife. He tolerated the first cycle of his systemic chemotherapy with carboplatin and Abraxane fairly well with no significant adverse effects. He denied having any significant nausea or vomiting, no fever or chills. He lost 1 pound since his last visit. He has good appetite but concerned about dysphagia from his previous head and neck cancer. I discussed with referred to gastroenterology for evaluation and consideration of PEG tube placement if needed but the patient declined and mentions that he is feeling okay for now. Pleurx catheter was removed yesterday by Dr. Kerin Ransom. He was supposed to start cycle #2 today but has low platelets count.  On 04/14/2013, The patient has been complaining of increasing fatigue and weakness as well as lack of appetite and dysphagia secondary to prior history of head and neck cancer. He was recently admitted to [facility] for treatment of dehydration and failure to thrive. During his admission he underwent PEG tube placement and he is currently receiving supplemental nutrition through the PEG tube. He is able to maintain his weight well.  The patient is too weak today to resume systemic chemotherapy. I  will keep his treatment on hold for now. I would repeat CT scan of the chest, abdomen and pelvis for restaging of his disease before resuming her systemic therapy and if no improvement the patient may be considered for palliative care and hospice referral.  For the chemotherapy-induced anemia, I will arrange for the patient to receive 2 units of PRBCs transfusion.  On 04/25/2013 The patient came to the clinic today accompanied by his wife for routine followup visit. He is feeling much better after receiving 2 units of PRBCs transfusion. He has more energy. He also eats a little bit better with supplemental nutrition. He continues to have mild fatigue. He denied having any significant chest pain, shortness of breath, cough or hemoptysis. He had repeat CT scan of the chest, abdomen and pelvis and he is here for evaluation and discussion of his scan results.  On 05/19/2013, the patient is here today for evaluation. He tolerated the last cycle of his systemic chemotherapy with carboplatin and Abraxane fairly well except for increasing peripheral neuropathy mainly in the fingers. He has back pain recently and would like to have referral to see Dr. Sharol Given for evaluation. He denied having any significant fever or chills, no nausea or vomiting. He denied having any significant chest pain, shortness breath, cough or hemoptysis.  06/10/2013, the patient presents for a rescheduled appointment to proceed with cycle #5 of his systemic chemotherapy with carboplatin and Abraxane. He was initially scheduled  for 06/09/2013 however arrived too late to be seen or treated. Overall is tolerating his chemotherapy with carboplatin and Abraxane relatively well. He does however complain of "excruciating pain" involving his hips and legs primarily in the thigh region. She requests refills for his pain medication as well as his Ativan. Of note his Doxy code was changed to 2 tablets  For each dose of 71 tablet.These were on 06/09/2013 and  he presented to have his labs drawn prior to chemotherapy. His by mouth intake has improved. His weight is up to 170.3 pounds previously 167.6 pounds. He denied having any significant fever or chills, no nausea or vomiting. He denied having any significant chest pain, shortness breath, cough or hemoptysis.    MEDICAL HISTORY: Past Medical History  Diagnosis Date  . Hyperlipidemia   . OSA (obstructive sleep apnea)   . BPH (benign prostatic hypertrophy)   . Kidney disease     TCC  . Xerostomia   . Pharynx cancer     squamous cell stage 4,s/p XRT,chemo, neck dissection  . Colon polyp     HYPERPLASTIC & TUBULAR ADENOMA(Colonoscopy-Dr.[place])  . Diverticulosis of colon (without mention of hemorrhage)     (Colonoscopy-Dr.[place])  . Internal hemorrhoids without mention of complication     (Colonoscopy-Dr.[place])  . GERD (gastroesophageal reflux disease)     (EGD-Dr. Velora Heckler)  . Atrophic gastritis without mention of hemorrhage     (EGD-Dr. Velora Heckler)  . History of radiation therapy   . Small cell lung cancer   . Non-small cell lung cancer     ALLERGIES:  is allergic to erythromycin and tetracycline.  MEDICATIONS:  Current Outpatient Prescriptions  Medication Sig Dispense Refill  . fentaNYL (DURAGESIC - DOSED MCG/HR) 50 MCG/HR Place 1 patch (50 mcg total) onto the skin every 3 (three) days.  10 patch  0  . finasteride (PROSCAR) 5 MG tablet Take 5 mg by mouth at bedtime.       . gabapentin (NEURONTIN) 100 MG capsule Take 2 capsules (200 mg total) by mouth 3 (three) times daily.  180 capsule  1  . levothyroxine (SYNTHROID, LEVOTHROID) 75 MCG tablet Take 75 mcg by mouth daily before breakfast.       . LORazepam (ATIVAN) 1 MG tablet Take 1 tablet (1 mg total) by mouth every 8 (eight) hours as needed for anxiety.  30 tablet  0  . mirtazapine (REMERON) 30 MG tablet TAKE ONE TABLET AT BEDTIME.  30 tablet  0  . Nutritional Supplements (FEEDING SUPPLEMENT, JEVITY 1.2 CAL,) LIQD Place 1,000  mLs into feeding tube every 6 (six) hours.  1000 mL  0  . oxyCODONE (OXY IR/ROXICODONE) 5 MG immediate release tablet Take 1 tablet (5 mg total) by mouth every 4 (four) hours as needed for moderate pain or severe pain.  45 tablet  0  . acetaminophen (TYLENOL) 500 MG tablet Take 500 mg by mouth every 4 (four) hours as needed (Pain).       Marland Kitchen antiseptic oral rinse (BIOTENE) LIQD 15 mLs by Mouth Rinse route as needed for dry mouth.      . docusate sodium 100 MG CAPS Take 200 mg by mouth 2 (two) times daily.  10 capsule  0  . mometasone (NASONEX) 50 MCG/ACT nasal spray Place 2 sprays into the nose daily.      . ondansetron (ZOFRAN) 4 MG tablet Take 1 tablet (4 mg total) by mouth every 8 (eight) hours as needed for nausea or vomiting.  90 tablet  2  . terazosin (HYTRIN) 1 MG capsule Take 1 mg by mouth 2 (two) times daily.        No current facility-administered medications for this visit.   Facility-Administered Medications Ordered in Other Visits  Medication Dose Route Frequency Provider Last Rate Last Dose  . CARBOplatin (PARAPLATIN) 420 mg in sodium chloride 0.9 % 250 mL chemo infusion  420 mg Intravenous Once Curt Bears, MD      . heparin lock flush 100 unit/mL  500 Units Intracatheter Once PRN Curt Bears, MD      . sodium chloride 0.9 % injection 10 mL  10 mL Intracatheter PRN Curt Bears, MD        SURGICAL HISTORY:  Past Surgical History  Procedure Laterality Date  . Appendectomy    . Lumbar laminectomy    . Tonsillectomy    . Bilateral ganglionectomies    . Radical right neck dissection  2008  . Pci-rfa renal cell (tcc) cancer  2009    pt denies  . Cholecystectomy  dec. 2009  . Septoplasty  03/01/12    with double turbinectomy  . Video bronchoscopy Bilateral 08/10/2012    Procedure: VIDEO BRONCHOSCOPY WITH FLUORO;  Surgeon: Rigoberto Noel, MD;  Location: [facility];  Service: Cardiopulmonary;  Laterality: Bilateral;  . Shoulder arthroscopy Left 2013  . Thoracentesis  Right 10/2012    REVIEW OF SYSTEMS:  Constitutional: positive for fatigue Eyes: negative Ears, nose, mouth, throat, and face: negative Respiratory: positive for pleurisy/chest pain Cardiovascular: negative Gastrointestinal: positive for dysphagia Genitourinary:negative Integument/breast: negative Hematologic/lymphatic: negative Musculoskeletal:positive for back pain and bone pain Neurological: negative Behavioral/Psych: positive for anxiety, bad mood, depression, irritability and mood swings Endocrine: negative Allergic/Immunologic: negative   PHYSICAL EXAMINATION: General appearance: alert, cooperative and no distress Head: Normocephalic, without obvious abnormality, atraumatic Neck: no adenopathy and supple, symmetrical, trachea midline Lymph nodes: Cervical, supraclavicular, and axillary nodes normal. Resp: clear to auscultation bilaterally Back: symmetric, no curvature. ROM normal. No CVA tenderness. Cardio: regular rate and rhythm, S1, S2 normal, no murmur, click, rub or gallop GI: soft, non-tender; bowel sounds normal; no masses,  no organomegaly Extremities: extremities normal, atraumatic, no cyanosis or edema Neurologic: Alert and oriented X 3, normal strength and tone. Normal symmetric reflexes. Normal coordination and gait   ECOG PERFORMANCE STATUS: 1 - Symptomatic but completely ambulatory  Blood pressure 137/59, pulse 71, temperature 97.9 F (36.6 C), temperature source Oral, resp. rate 18, height 6' (1.829 m), weight 170 lb 4.8 oz (77.248 kg), SpO2 94.00%.  LABORATORY DATA: Lab Results  Component Value Date   WBC 3.9* 06/09/2013   HGB 9.1* 06/09/2013   HCT 28.3* 06/09/2013   MCV 93.5 06/09/2013   PLT 125* 06/09/2013      Chemistry      Component Value Date/Time   NA 141 06/09/2013 1353   NA 131* 04/05/2013 0545   K 3.8 06/09/2013 1353   K 3.8 04/05/2013 0545   CL 93* 04/05/2013 0545   CO2 25 06/09/2013 1353   CO2 27 04/05/2013 0545   BUN 19.1 06/09/2013 1353    BUN 8 04/05/2013 0545   CREATININE 0.8 06/09/2013 1353   CREATININE 0.79 04/05/2013 0545      Component Value Date/Time   CALCIUM 8.8 06/09/2013 1353   CALCIUM 8.3* 04/05/2013 0545   ALKPHOS 71 06/09/2013 1353   ALKPHOS 76 05/13/2012 1005   AST 13 06/09/2013 1353   AST 16 05/13/2012 1005   ALT 9 06/09/2013 1353   ALT 11  05/13/2012 1005   BILITOT 0.21 06/09/2013 1353   BILITOT 0.5 05/13/2012 1005       RADIOGRAPHIC STUDIES:  ASSESSMENT AND PLAN: This is another very extensive and long visit with the patient and his wife today. The patient is a 79 years old white male who was diagnosed with extensive stage small cell lung cancer status post systemic chemotherapy with carboplatin and etoposide with progressive disease on the right side of the chest. He was recently seen at [facility] by Dr. Sharlet Salina and was considered for a clinical trial with Ipilumomab and Nivolumab.  Recent repeat biopsy of a right pleural based nodule showed poorly differentiated non-small cell carcinoma. He is currently undergoing systemic chemotherapy with carboplatin and Abraxane status post 4 cycles. He tolerated the chemotherapy fairly well except for increasing fatigue secondary to chemotherapy-induced anemia as well as thrombocytopenia. To further evaluate his complaints of hip and leg pain, you'll be sent for x-rays of the pelvis and femurs. He'll continue his pain medications as prescribed. Labs were reviewed and we'll proceed with cycle #5 day 1 of his therapy with carboplatin and Abraxane today. He will continue with labs and chemotherapy as scheduled. He'll followup with Dr. Julien Nordmann in 3 weeks with restaging CT scan of the chest, abdomen and pelvis with contrast to reevaluate his disease. For the peripheral neuropathy, The dose of Abraxane is reduced to 80 mg/M2 and He will continue Neurontin dose to 200 mg by mouth 3 times a day.  He was advised to call immediately if he has any concerning  symptoms in the interval. The patient voices understanding of current disease status and treatment options and is in agreement with the current care plan.  All questions were answered. The patient knows to call the clinic with any problems, questions or concerns. We can certainly see the patient much sooner if necessary.  I spent 25 minutes on face-to-face counseling with the patient and his wife today of the total visit time of 35 minutes.  Disclaimer: This note was dictated with voice recognition software. Similar sounding words can inadvertently be transcribed and may not be corrected upon review.  Carlton Adam, PA-C 06/10/2013

## 2013-06-10 NOTE — Progress Notes (Signed)
Carbo test dose neg at 5, 15, and 30 minutes

## 2013-06-10 NOTE — Telephone Encounter (Signed)
gv adn printed appt sched adn avs for pt for May adn June....sed added tx...sent  pt to radiology....gv pt barium

## 2013-06-12 NOTE — Patient Instructions (Signed)
We will check x-rays of your hips and femurs to look for metastatic bone lesions or possible fractures Continue labs and chemotherapy as scheduled Follow up with Dr. Julien Nordmann in 3 weeks with restaging CT scan of your chest, abdomen and pelvis with contrast to reevaluate her disease.

## 2013-06-14 ENCOUNTER — Other Ambulatory Visit: Payer: Self-pay | Admitting: Physician Assistant

## 2013-06-14 DIAGNOSIS — C3490 Malignant neoplasm of unspecified part of unspecified bronchus or lung: Secondary | ICD-10-CM

## 2013-06-16 ENCOUNTER — Ambulatory Visit: Payer: Commercial Managed Care - HMO

## 2013-06-16 ENCOUNTER — Other Ambulatory Visit: Payer: Self-pay

## 2013-06-16 ENCOUNTER — Other Ambulatory Visit (HOSPITAL_BASED_OUTPATIENT_CLINIC_OR_DEPARTMENT_OTHER): Payer: Commercial Managed Care - HMO

## 2013-06-16 ENCOUNTER — Other Ambulatory Visit: Payer: Self-pay | Admitting: *Deleted

## 2013-06-16 ENCOUNTER — Ambulatory Visit (HOSPITAL_BASED_OUTPATIENT_CLINIC_OR_DEPARTMENT_OTHER): Payer: Commercial Managed Care - HMO

## 2013-06-16 ENCOUNTER — Encounter: Payer: Self-pay | Admitting: Physician Assistant

## 2013-06-16 ENCOUNTER — Ambulatory Visit (INDEPENDENT_AMBULATORY_CARE_PROVIDER_SITE_OTHER): Payer: Commercial Managed Care - HMO | Admitting: Physician Assistant

## 2013-06-16 ENCOUNTER — Telehealth: Payer: Self-pay | Admitting: *Deleted

## 2013-06-16 VITALS — BP 114/68 | HR 74 | Temp 97.9°F | Resp 16 | Ht 72.0 in | Wt 172.5 lb

## 2013-06-16 DIAGNOSIS — E039 Hypothyroidism, unspecified: Secondary | ICD-10-CM

## 2013-06-16 DIAGNOSIS — N400 Enlarged prostate without lower urinary tract symptoms: Secondary | ICD-10-CM

## 2013-06-16 DIAGNOSIS — C3490 Malignant neoplasm of unspecified part of unspecified bronchus or lung: Secondary | ICD-10-CM

## 2013-06-16 DIAGNOSIS — C3491 Malignant neoplasm of unspecified part of right bronchus or lung: Secondary | ICD-10-CM

## 2013-06-16 DIAGNOSIS — M549 Dorsalgia, unspecified: Secondary | ICD-10-CM

## 2013-06-16 DIAGNOSIS — D649 Anemia, unspecified: Secondary | ICD-10-CM

## 2013-06-16 DIAGNOSIS — H409 Unspecified glaucoma: Secondary | ICD-10-CM

## 2013-06-16 DIAGNOSIS — Z95828 Presence of other vascular implants and grafts: Secondary | ICD-10-CM

## 2013-06-16 DIAGNOSIS — K119 Disease of salivary gland, unspecified: Secondary | ICD-10-CM

## 2013-06-16 DIAGNOSIS — Z5111 Encounter for antineoplastic chemotherapy: Secondary | ICD-10-CM

## 2013-06-16 DIAGNOSIS — F410 Panic disorder [episodic paroxysmal anxiety] without agoraphobia: Secondary | ICD-10-CM

## 2013-06-16 LAB — CBC WITH DIFFERENTIAL/PLATELET
BASO%: 0.6 % (ref 0.0–2.0)
Basophils Absolute: 0 10*3/uL (ref 0.0–0.1)
EOS%: 0.8 % (ref 0.0–7.0)
Eosinophils Absolute: 0 10*3/uL (ref 0.0–0.5)
HEMATOCRIT: 25.9 % — AB (ref 38.4–49.9)
HGB: 8.3 g/dL — ABNORMAL LOW (ref 13.0–17.1)
LYMPH%: 16.9 % (ref 14.0–49.0)
MCH: 30.2 pg (ref 27.2–33.4)
MCHC: 32 g/dL (ref 32.0–36.0)
MCV: 94.2 fL (ref 79.3–98.0)
MONO#: 0.4 10*3/uL (ref 0.1–0.9)
MONO%: 11.3 % (ref 0.0–14.0)
NEUT#: 2.5 10*3/uL (ref 1.5–6.5)
NEUT%: 70.4 % (ref 39.0–75.0)
PLATELETS: 99 10*3/uL — AB (ref 140–400)
RBC: 2.75 10*6/uL — ABNORMAL LOW (ref 4.20–5.82)
RDW: 18.3 % — ABNORMAL HIGH (ref 11.0–14.6)
WBC: 3.6 10*3/uL — ABNORMAL LOW (ref 4.0–10.3)
lymph#: 0.6 10*3/uL — ABNORMAL LOW (ref 0.9–3.3)

## 2013-06-16 LAB — COMPREHENSIVE METABOLIC PANEL (CC13)
ALK PHOS: 66 U/L (ref 40–150)
ALT: 7 U/L (ref 0–55)
AST: 14 U/L (ref 5–34)
Albumin: 2.7 g/dL — ABNORMAL LOW (ref 3.5–5.0)
Anion Gap: 9 mEq/L (ref 3–11)
BUN: 27.7 mg/dL — ABNORMAL HIGH (ref 7.0–26.0)
CO2: 24 mEq/L (ref 22–29)
CREATININE: 1 mg/dL (ref 0.7–1.3)
Calcium: 8.6 mg/dL (ref 8.4–10.4)
Chloride: 108 mEq/L (ref 98–109)
Glucose: 107 mg/dl (ref 70–140)
Potassium: 3.9 mEq/L (ref 3.5–5.1)
Sodium: 142 mEq/L (ref 136–145)
Total Protein: 6.1 g/dL — ABNORMAL LOW (ref 6.4–8.3)

## 2013-06-16 LAB — PREPARE RBC (CROSSMATCH)

## 2013-06-16 MED ORDER — OXYCODONE HCL 5 MG PO TABS: 5.0000 mg | ORAL_TABLET | ORAL | Status: DC | PRN

## 2013-06-16 MED ORDER — LORAZEPAM 1 MG PO TABS: 1.0000 mg | ORAL_TABLET | Freq: Three times a day (TID) | ORAL | Status: DC | PRN

## 2013-06-16 MED ORDER — PACLITAXEL PROTEIN-BOUND CHEMO INJECTION 100 MG
80.0000 mg/m2 | Freq: Once | INTRAVENOUS | Status: AC
  Administered 2013-06-16: 150 mg via INTRAVENOUS
  Filled 2013-06-16: qty 30

## 2013-06-16 MED ORDER — OXYCODONE-ACETAMINOPHEN 5-325 MG PO TABS
1.0000 | ORAL_TABLET | Freq: Once | ORAL | Status: AC
  Administered 2013-06-16: 1 via ORAL

## 2013-06-16 MED ORDER — ONDANSETRON 8 MG/50ML IVPB (CHCC)
8.0000 mg | Freq: Once | INTRAVENOUS | Status: AC
  Administered 2013-06-16: 8 mg via INTRAVENOUS

## 2013-06-16 MED ORDER — FINASTERIDE 5 MG PO TABS: 5.0000 mg | ORAL_TABLET | Freq: Every day | ORAL | Status: AC

## 2013-06-16 MED ORDER — LEVOTHYROXINE SODIUM 88 MCG PO TABS: 88.0000 ug | ORAL_TABLET | Freq: Every day | ORAL | Status: AC

## 2013-06-16 MED ORDER — DEXAMETHASONE SODIUM PHOSPHATE 10 MG/ML IJ SOLN
INTRAMUSCULAR | Status: AC
Start: 2013-06-16 — End: 2013-06-16
  Filled 2013-06-16: qty 1

## 2013-06-16 MED ORDER — SODIUM CHLORIDE 0.9 % IJ SOLN
10.0000 mL | INTRAMUSCULAR | Status: DC | PRN
Start: 2013-06-16 — End: 2013-06-16
  Administered 2013-06-16: 10 mL via INTRAVENOUS
  Filled 2013-06-16: qty 10

## 2013-06-16 MED ORDER — SODIUM CHLORIDE 0.9 % IJ SOLN
10.0000 mL | INTRAMUSCULAR | Status: DC | PRN
  Administered 2013-06-16: 10 mL
  Filled 2013-06-16: qty 10

## 2013-06-16 MED ORDER — HEPARIN SOD (PORK) LOCK FLUSH 100 UNIT/ML IV SOLN
500.0000 [IU] | Freq: Once | INTRAVENOUS | Status: AC | PRN
  Administered 2013-06-16: 500 [IU]
  Filled 2013-06-16: qty 5

## 2013-06-16 MED ORDER — SODIUM CHLORIDE 0.9 % IV SOLN
Freq: Once | INTRAVENOUS | Status: AC
  Administered 2013-06-16: 14:00:00 via INTRAVENOUS

## 2013-06-16 MED ORDER — ONDANSETRON 8 MG/NS 50 ML IVPB
INTRAVENOUS | Status: AC
  Filled 2013-06-16: qty 8

## 2013-06-16 MED ORDER — OXYCODONE-ACETAMINOPHEN 5-325 MG PO TABS
ORAL_TABLET | ORAL | Status: AC
  Filled 2013-06-16: qty 1

## 2013-06-16 MED ORDER — DEXAMETHASONE SODIUM PHOSPHATE 10 MG/ML IJ SOLN
10.0000 mg | Freq: Once | INTRAMUSCULAR | Status: AC
  Administered 2013-06-16: 10 mg via INTRAVENOUS

## 2013-06-16 NOTE — Progress Notes (Signed)
Pre visit review using our clinic review tool, if applicable. No additional management support is needed unless otherwise documented below in the visit note/SLS  

## 2013-06-16 NOTE — Telephone Encounter (Signed)
Prescription for Ativan called in to [facility] at [facility].

## 2013-06-16 NOTE — Patient Instructions (Signed)

## 2013-06-16 NOTE — Progress Notes (Signed)
Patient presents to clinic today to establish care.  Acute Concerns: Patient requesting increase in levothyroxine dosage.  Has had two TSH levels drawn at his Oncologist office.  They are in the system for review.  TSH was found to be 20.82 (2 months ago) and 13.8 (1 months ago). Patient currently on 75 mcg. Endorses some mild fatigue.  Denies constipation, cold intolerance or skin changes.  Patient requesting the following referrals for insurance purposes: (1) Dr. Lucia Gaskins -- ENT (2) Dr. Deno Etienne -- Orthopedics (3) Dr. Rosana Hoes -- Urology (4) Dr. Earlie Server -- Oncology (5) Dr. Talbert Forest -- Ophthalmology  (6) Dr. Elsworth Soho -- Pulmonology   Chronic Issues: (1) Small-Cell lung cancer -- with hilar adenopathy.  Followed by Dr. Earlie Server (Oncology) and Dr. Elsworth Soho (Pulmonology).  On Fentanyl, Gabapentin and OxyIR for pain management.  (2) BPH -- currently on finasteride and terazosin. Followed by Urology, Dr. Rosana Hoes.  (3) Depression/Anxiety -- currently on Remeron and Ativan. Doing well consider recent diagnosis of SCLC.  Health Maintenance: Dental -- up-to-date Vision -- up-to-date Immunizations -- Required immunizations are up-to-date Colonoscopy -- 2007; due in 2017  Bone Density -- Followed by Oncology with upcoming testing.  Past Medical History  Diagnosis Date  . Hyperlipidemia   . OSA (obstructive sleep apnea)   . BPH (benign prostatic hypertrophy)   . Kidney disease     TCC  . Xerostomia   . Pharynx cancer     squamous cell stage 4,s/p XRT,chemo, neck dissection  . Colon polyp     HYPERPLASTIC & TUBULAR ADENOMA(Colonoscopy-Dr.[place])  . Diverticulosis of colon (without mention of hemorrhage)     (Colonoscopy-Dr.[place])  . Internal hemorrhoids without mention of complication     (Colonoscopy-Dr.[place])  . GERD (gastroesophageal reflux disease)     (EGD-Dr. Velora Heckler)  . Atrophic gastritis without mention of hemorrhage     (EGD-Dr. Velora Heckler)  . History of radiation therapy   . Small cell  lung cancer   . Non-small cell lung cancer     Past Surgical History  Procedure Laterality Date  . Appendectomy    . Lumbar laminectomy    . Tonsillectomy    . Bilateral ganglionectomies    . Radical right neck dissection  2008  . Pci-rfa renal cell (tcc) cancer  2009    pt denies  . Cholecystectomy  dec. 2009  . Septoplasty  03/01/12    with double turbinectomy  . Video bronchoscopy Bilateral 08/10/2012    Procedure: VIDEO BRONCHOSCOPY WITH FLUORO;  Surgeon: Rigoberto Noel, MD;  Location: [place];  Service: Cardiopulmonary;  Laterality: Bilateral;  . Shoulder arthroscopy Left 2013  . Thoracentesis Right 10/2012    Current Outpatient Prescriptions on File Prior to Visit  Medication Sig Dispense Refill  . acetaminophen (TYLENOL) 500 MG tablet Take 500 mg by mouth every 4 (four) hours as needed (Pain).       Marland Kitchen antiseptic oral rinse (BIOTENE) LIQD 15 mLs by Mouth Rinse route as needed for dry mouth.      . fentaNYL (DURAGESIC - DOSED MCG/HR) 50 MCG/HR Place 1 patch (50 mcg total) onto the skin every 3 (three) days.  10 patch  0  . gabapentin (NEURONTIN) 100 MG capsule Take 2 capsules (200 mg total) by mouth 3 (three) times daily.  180 capsule  1  . mirtazapine (REMERON) 30 MG tablet TAKE ONE TABLET AT BEDTIME.  30 tablet  0  . mometasone (NASONEX) 50 MCG/ACT nasal spray Place 2 sprays into the nose daily.      Marland Kitchen  ondansetron (ZOFRAN) 4 MG tablet Take 1 tablet (4 mg total) by mouth every 8 (eight) hours as needed for nausea or vomiting.  90 tablet  2  . terazosin (HYTRIN) 1 MG capsule Take 1 mg by mouth 2 (two) times daily.        No current facility-administered medications on file prior to visit.    Allergies  Allergen Reactions  . Erythromycin Diarrhea  . Tetracycline Diarrhea    Family History  Problem Relation Age of Onset  . Heart failure Father 49    Deceased  . Kidney failure Father   . Congestive Heart Failure Father   . Heart failure Mother 46    Deceased  .  Kidney failure Mother   . Colon cancer Neg Hx   . Prostate cancer Neg Hx   . Diabetes Neg Hx   . Healthy Son   . Healthy Daughter     History   Social History  . Marital Status: Married    Spouse Name: N/A    Number of Children: 2  . Years of Education: N/A   Occupational History  . minister    Social History Main Topics  . Smoking status: Never Smoker   . Smokeless tobacco: Never Used  . Alcohol Use: No  . Drug Use: No  . Sexual Activity: No   Other Topics Concern  . Not on file   Social History Narrative   Father deceased @ 89,Mother deceased @ 34. [place];[employer]. Married -62-17 yrs./divorced;married '83. 1 son-'63;1 daughter '64; 1 stepson '66; 1 grandchild.      [place]-times this past week.     Review of Systems  Constitutional: Positive for weight loss. Negative for fever.  HENT: Negative for ear discharge, ear pain, hearing loss and tinnitus.   Eyes: Positive for blurred vision. Negative for double vision, photophobia, pain and discharge.  Respiratory: Positive for shortness of breath. Negative for cough, sputum production and wheezing.   Cardiovascular: Negative for chest pain, palpitations and orthopnea.  Gastrointestinal: Positive for nausea and constipation. Negative for heartburn, vomiting, abdominal pain, diarrhea, blood in stool and melena.  Genitourinary: Negative for dysuria, urgency, frequency, hematuria and flank pain.  Neurological: Negative for dizziness, loss of consciousness and headaches.  Endo/Heme/Allergies: Negative for environmental allergies.  Psychiatric/Behavioral: Negative for depression, suicidal ideas, hallucinations and substance abuse. The patient is nervous/anxious. The patient does not have insomnia.     BP 114/68  Pulse 74  Temp(Src) 97.9 F (36.6 C) (Oral)  Resp 16  Ht 6' (1.829 m)  Wt 172 lb 8 oz (78.245 kg)  BMI 23.39 kg/m2  SpO2 97%  Physical Exam  Vitals reviewed. Constitutional: He is  oriented to person, place, and time and well-developed, well-nourished, and in no distress.  HENT:  Head: Normocephalic and atraumatic.  Right Ear: External ear normal.  Left Ear: External ear normal.  Nose: Nose normal.  Mouth/Throat: Oropharynx is clear and moist. No oropharyngeal exudate.  TM within normal limits.  Eyes: Conjunctivae are normal. Pupils are equal, round, and reactive to light.  Neck: Neck supple.  Cardiovascular: Normal rate, regular rhythm, normal heart sounds and intact distal pulses.   Pulmonary/Chest: Effort normal and breath sounds normal. No respiratory distress. He has no wheezes. He has no rales. He exhibits no tenderness.  Neurological: He is alert and oriented to person, place, and time.  Skin: Skin is warm and dry. No rash noted.  Psychiatric: Affect normal.    Recent  Results (from the past 2160 hour(s))  CBC WITH DIFFERENTIAL     Status: Abnormal   Collection Time    03/24/13  1:06 PM      Result Value Ref Range   WBC 2.5 (*) 4.0 - 10.3 10e3/uL   NEUT# 1.6  1.5 - 6.5 10e3/uL   HGB 11.8 (*) 13.0 - 17.1 g/dL   HCT 36.4 (*) 38.4 - 49.9 %   Platelets 128 (*) 140 - 400 10e3/uL   MCV 86.7  79.3 - 98.0 fL   MCH 28.1  27.2 - 33.4 pg   MCHC 32.4  32.0 - 36.0 g/dL   RBC 4.20  4.20 - 5.82 10e6/uL   RDW 16.8 (*) 11.0 - 14.6 %   lymph# 0.2 (*) 0.9 - 3.3 10e3/uL   MONO# 0.8  0.1 - 0.9 10e3/uL   Eosinophils Absolute 0.0  0.0 - 0.5 10e3/uL   Basophils Absolute 0.0  0.0 - 0.1 10e3/uL   NEUT% 62.6  39.0 - 75.0 %   LYMPH% 7.1 (*) 14.0 - 49.0 %   MONO% 29.5 (*) 0.0 - 14.0 %   EOS% 0.4  0.0 - 7.0 %   BASO% 0.4  0.0 - 2.0 %  COMPREHENSIVE METABOLIC PANEL (GY18)     Status: Abnormal   Collection Time    03/24/13  1:06 PM      Result Value Ref Range   Sodium 138  136 - 145 mEq/L   Potassium 3.3 (*) 3.5 - 5.1 mEq/L   Chloride 101  98 - 109 mEq/L   CO2 27  22 - 29 mEq/L   Glucose 106  70 - 140 mg/dl   BUN 16.3  7.0 - 26.0 mg/dL   Creatinine 0.8  0.7 - 1.3 mg/dL    Total Bilirubin 0.52  0.20 - 1.20 mg/dL   Alkaline Phosphatase 198 (*) 40 - 150 U/L   AST 28  5 - 34 U/L   ALT 24  0 - 55 U/L   Total Protein 6.4  6.4 - 8.3 g/dL   Albumin 1.9 (*) 3.5 - 5.0 g/dL   Calcium 9.1  8.4 - 10.4 mg/dL   Anion Gap 10  3 - 11 mEq/L  CBC WITH DIFFERENTIAL     Status: Abnormal   Collection Time    03/31/13 11:34 AM      Result Value Ref Range   WBC 4.6  4.0 - 10.3 10e3/uL   NEUT# 3.8  1.5 - 6.5 10e3/uL   HGB 8.1 (*) 13.0 - 17.1 g/dL   HCT 25.3 (*) 38.4 - 49.9 %   Platelets 185  140 - 400 10e3/uL   MCV 87.5  79.3 - 98.0 fL   MCH 28.0  27.2 - 33.4 pg   MCHC 32.0  32.0 - 36.0 g/dL   RBC 2.89 (*) 4.20 - 5.82 10e6/uL   RDW 17.0 (*) 11.0 - 14.6 %   lymph# 0.3 (*) 0.9 - 3.3 10e3/uL   MONO# 0.4  0.1 - 0.9 10e3/uL   Eosinophils Absolute 0.1  0.0 - 0.5 10e3/uL   Basophils Absolute 0.0  0.0 - 0.1 10e3/uL   NEUT% 82.7 (*) 39.0 - 75.0 %   LYMPH% 6.5 (*) 14.0 - 49.0 %   MONO% 8.3  0.0 - 14.0 %   EOS% 2.2  0.0 - 7.0 %   BASO% 0.3  0.0 - 2.0 %  COMPREHENSIVE METABOLIC PANEL (HU31)     Status: Abnormal   Collection Time  03/31/13 11:34 AM      Result Value Ref Range   Sodium 139  136 - 145 mEq/L   Potassium 3.4 (*) 3.5 - 5.1 mEq/L   Chloride 100  98 - 109 mEq/L   CO2 27  22 - 29 mEq/L   Glucose 108  70 - 140 mg/dl   BUN 20.7  7.0 - 26.0 mg/dL   Creatinine 0.9  0.7 - 1.3 mg/dL   Total Bilirubin 0.37  0.20 - 1.20 mg/dL   Alkaline Phosphatase 141  40 - 150 U/L   AST 10  5 - 34 U/L   ALT 14  0 - 55 U/L   Total Protein 6.5  6.4 - 8.3 g/dL   Albumin 2.1 (*) 3.5 - 5.0 g/dL   Calcium 9.1  8.4 - 10.4 mg/dL   Anion Gap 12 (*) 3 - 11 mEq/L  CBC     Status: Abnormal   Collection Time    04/01/13  3:30 PM      Result Value Ref Range   WBC 3.9 (*) 4.0 - 10.5 K/uL   RBC 2.64 (*) 4.22 - 5.81 MIL/uL   Hemoglobin 7.3 (*) 13.0 - 17.0 g/dL   HCT 22.7 (*) 39.0 - 52.0 %   MCV 86.0  78.0 - 100.0 fL   MCH 27.7  26.0 - 34.0 pg   MCHC 32.2  30.0 - 36.0 g/dL   RDW 16.8 (*)  11.5 - 15.5 %   Platelets 169  150 - 400 K/uL  CREATININE, SERUM     Status: Abnormal   Collection Time    04/01/13  3:30 PM      Result Value Ref Range   Creatinine, Ser 0.89  0.50 - 1.35 mg/dL   GFR calc non Af Amer 81 (*) >90 mL/min   GFR calc Af Amer >90  >90 mL/min   Comment: (NOTE)     The eGFR has been calculated using the CKD EPI equation.     This calculation has not been validated in all clinical situations.     eGFR's persistently <90 mL/min signify possible Chronic Kidney     Disease.  BASIC METABOLIC PANEL     Status: Abnormal   Collection Time    04/02/13  4:48 AM      Result Value Ref Range   Sodium 136 (*) 137 - 147 mEq/L   Potassium 3.3 (*) 3.7 - 5.3 mEq/L   Chloride 98  96 - 112 mEq/L   CO2 27  19 - 32 mEq/L   Glucose, Bld 137 (*) 70 - 99 mg/dL   BUN 13  6 - 23 mg/dL   Creatinine, Ser 0.85  0.50 - 1.35 mg/dL   Calcium 8.5  8.4 - 10.5 mg/dL   GFR calc non Af Amer 83 (*) >90 mL/min   GFR calc Af Amer >90  >90 mL/min   Comment: (NOTE)     The eGFR has been calculated using the CKD EPI equation.     This calculation has not been validated in all clinical situations.     eGFR's persistently <90 mL/min signify possible Chronic Kidney     Disease.  CBC     Status: Abnormal   Collection Time    04/02/13  4:48 AM      Result Value Ref Range   WBC 3.7 (*) 4.0 - 10.5 K/uL   RBC 2.54 (*) 4.22 - 5.81 MIL/uL   Hemoglobin 7.3 (*) 13.0 - 17.0 g/dL  HCT 22.2 (*) 39.0 - 52.0 %   MCV 87.4  78.0 - 100.0 fL   MCH 28.7  26.0 - 34.0 pg   MCHC 32.9  30.0 - 36.0 g/dL   RDW 16.7 (*) 11.5 - 15.5 %   Platelets 154  150 - 400 K/uL  IRON AND TIBC     Status: Abnormal   Collection Time    04/02/13  4:48 AM      Result Value Ref Range   Iron 14 (*) 42 - 135 ug/dL   TIBC 113 (*) 215 - 435 ug/dL   Saturation Ratios 12 (*) 20 - 55 %   UIBC 99 (*) 125 - 400 ug/dL   Comment: Performed at [facility]     Status: Abnormal   Collection Time    04/02/13  4:48 AM       Result Value Ref Range   Ferritin 2032 (*) 22 - 322 ng/mL   Comment: Result confirmed by automatic dilution.     Performed at [facility], SERUM     Status: Abnormal   Collection Time    04/02/13  4:48 AM      Result Value Ref Range   Total Protein ELP 5.2 (*) 6.0 - 8.3 g/dL   Albumin ELP 40.2 (*) 55.8 - 66.1 %   Alpha-1-Globulin 12.1 (*) 2.9 - 4.9 %   Alpha-2-Globulin 20.8 (*) 7.1 - 11.8 %   Beta Globulin 6.2  4.7 - 7.2 %   Beta 2 5.2  3.2 - 6.5 %   Gamma Globulin 15.5  11.1 - 18.8 %   M-Spike, % NOT DETECTED     SPE Interp. (NOTE)     Comment: Nonspecific pattern, sometimes seen in association with acute     inflammatory response pattern.     Reviewed by Odis Hollingshead, MD, PhD, FCAP (Electronic Signature on     File)   Comment (NOTE)     Comment: ---------------     Serum protein electrophoresis is a useful screening procedure in the     detection of various pathophysiologic states such as inflammation,     gammopathies, protein loss and other dysproteinemias.  Immunofixation     electrophoresis (IFE) is a more sensitive technique for the     identification of M-proteins found in patients with monoclonal     gammopathy of unknown significance (MGUS), amyloidosis, early or     treated myeloma or macroglobulinemia, solitary plasmacytoma or     extramedullary plasmacytoma.     Performed at [facility]  VITAMIN B12     Status: Abnormal   Collection Time    04/02/13  4:48 AM      Result Value Ref Range   Vitamin B-12 1531 (*) 211 - 911 pg/mL   Comment: Performed at [facility] RBC     Status: None   Collection Time    04/02/13  4:48 AM      Result Value Ref Range   RBC Folate 537  >280 ng/mL   Comment: Reference range not established for pediatric patients.     Performed at [facility]  TSH     Status: Abnormal   Collection Time    04/02/13  4:48 AM      Result Value Ref Range   TSH 20.802  (*) 0.350 - 4.500 uIU/mL   Comment: Performed at [facility]  Status: None   Collection Time    04/02/13  4:48 AM      Result Value Ref Range   Magnesium 1.5  1.5 - 2.5 mg/dL  BASIC METABOLIC PANEL     Status: Abnormal   Collection Time    04/03/13 12:00 PM      Result Value Ref Range   Sodium 133 (*) 137 - 147 mEq/L   Potassium 4.3  3.7 - 5.3 mEq/L   Comment: DELTA CHECK NOTED   Chloride 94 (*) 96 - 112 mEq/L   CO2 27  19 - 32 mEq/L   Glucose, Bld 136 (*) 70 - 99 mg/dL   BUN 10  6 - 23 mg/dL   Creatinine, Ser 0.79  0.50 - 1.35 mg/dL   Calcium 8.9  8.4 - 10.5 mg/dL   GFR calc non Af Amer 85 (*) >90 mL/min   GFR calc Af Amer >90  >90 mL/min   Comment: (NOTE)     The eGFR has been calculated using the CKD EPI equation.     This calculation has not been validated in all clinical situations.     eGFR's persistently <90 mL/min signify possible Chronic Kidney     Disease.  CBC     Status: Abnormal   Collection Time    04/03/13 12:00 PM      Result Value Ref Range   WBC 3.1 (*) 4.0 - 10.5 K/uL   RBC 2.76 (*) 4.22 - 5.81 MIL/uL   Hemoglobin 7.5 (*) 13.0 - 17.0 g/dL   HCT 24.4 (*) 39.0 - 52.0 %   MCV 88.4  78.0 - 100.0 fL   MCH 27.2  26.0 - 34.0 pg   MCHC 30.7  30.0 - 36.0 g/dL   RDW 16.8 (*) 11.5 - 15.5 %   Platelets 160  150 - 400 K/uL  CBC     Status: Abnormal   Collection Time    04/04/13  4:13 AM      Result Value Ref Range   WBC 2.0 (*) 4.0 - 10.5 K/uL   RBC 2.37 (*) 4.22 - 5.81 MIL/uL   Hemoglobin 6.5 (*) 13.0 - 17.0 g/dL   Comment: REPEATED TO VERIFY     CRITICAL RESULT CALLED TO, READ BACK BY AND VERIFIED WITH:     DANDERSON RN AT 0522 ON [id number] BY DLONG   HCT 20.9 (*) 39.0 - 52.0 %   MCV 88.2  78.0 - 100.0 fL   MCH 27.4  26.0 - 34.0 pg   MCHC 31.1  30.0 - 36.0 g/dL   RDW 17.0 (*) 11.5 - 15.5 %   Platelets 122 (*) 150 - 400 K/uL   Comment: SPECIMEN CHECKED FOR CLOTS     REPEATED TO VERIFY     DELTA CHECK NOTED  PROTIME-INR      Status: Abnormal   Collection Time    04/04/13  4:13 AM      Result Value Ref Range   Prothrombin Time 15.6 (*) 11.6 - 15.2 seconds   INR 1.27  0.00 - 1.49  APTT     Status: Abnormal   Collection Time    04/04/13  4:13 AM      Result Value Ref Range   aPTT 41 (*) 24 - 37 seconds   Comment:            IF BASELINE aPTT IS ELEVATED,     SUGGEST PATIENT RISK ASSESSMENT     BE USED TO DETERMINE APPROPRIATE  ANTICOAGULANT THERAPY.  PREPARE RBC (CROSSMATCH)     Status: None   Collection Time    04/04/13  6:00 AM      Result Value Ref Range   Order Confirmation ORDER PROCESSED BY BLOOD BANK    TYPE AND SCREEN     Status: None   Collection Time    04/04/13  6:15 AM      Result Value Ref Range   ABO/RH(D) A POS     Antibody Screen NEG     Sample Expiration 04/07/2013     Unit Number [id number]     Blood Component Type RED CELLS,LR     Unit division 00     Status of Unit ISSUED,FINAL     Transfusion Status OK TO TRANSFUSE     Crossmatch Result Compatible    CBC     Status: Abnormal   Collection Time    04/05/13  5:45 AM      Result Value Ref Range   WBC 1.5 (*) 4.0 - 10.5 K/uL   RBC 2.48 (*) 4.22 - 5.81 MIL/uL   Hemoglobin 7.0 (*) 13.0 - 17.0 g/dL   HCT 21.5 (*) 39.0 - 52.0 %   MCV 86.7  78.0 - 100.0 fL   MCH 28.2  26.0 - 34.0 pg   MCHC 32.6  30.0 - 36.0 g/dL   RDW 16.9 (*) 11.5 - 15.5 %   Platelets 119 (*) 150 - 400 K/uL   Comment: CONSISTENT WITH PREVIOUS RESULT  BASIC METABOLIC PANEL     Status: Abnormal   Collection Time    04/05/13  5:45 AM      Result Value Ref Range   Sodium 131 (*) 137 - 147 mEq/L   Potassium 3.8  3.7 - 5.3 mEq/L   Chloride 93 (*) 96 - 112 mEq/L   CO2 27  19 - 32 mEq/L   Glucose, Bld 111 (*) 70 - 99 mg/dL   BUN 8  6 - 23 mg/dL   Creatinine, Ser 0.79  0.50 - 1.35 mg/dL   Calcium 8.3 (*) 8.4 - 10.5 mg/dL   GFR calc non Af Amer 85 (*) >90 mL/min   GFR calc Af Amer >90  >90 mL/min   Comment: (NOTE)     The eGFR has been calculated using  the CKD EPI equation.     This calculation has not been validated in all clinical situations.     eGFR's persistently <90 mL/min signify possible Chronic Kidney     Disease.  CBC WITH DIFFERENTIAL     Status: Abnormal   Collection Time    04/14/13 10:57 AM      Result Value Ref Range   WBC 7.9  4.0 - 10.3 10e3/uL   NEUT# 6.0  1.5 - 6.5 10e3/uL   HGB 7.4 (*) 13.0 - 17.1 g/dL   HCT 23.0 (*) 38.4 - 49.9 %   Platelets 268  140 - 400 10e3/uL   MCV 87.7  79.3 - 98.0 fL   MCH 28.2  27.2 - 33.4 pg   MCHC 32.1  32.0 - 36.0 g/dL   RBC 2.63 (*) 4.20 - 5.82 10e6/uL   RDW 17.9 (*) 11.0 - 14.6 %   lymph# 0.5 (*) 0.9 - 3.3 10e3/uL   MONO# 1.3 (*) 0.1 - 0.9 10e3/uL   Eosinophils Absolute 0.0  0.0 - 0.5 10e3/uL   Basophils Absolute 0.0  0.0 - 0.1 10e3/uL   NEUT% 76.8 (*) 39.0 - 75.0 %  LYMPH% 6.5 (*) 14.0 - 49.0 %   MONO% 16.1 (*) 0.0 - 14.0 %   EOS% 0.2  0.0 - 7.0 %   BASO% 0.4  0.0 - 2.0 %  COMPREHENSIVE METABOLIC PANEL (VE93)     Status: Abnormal   Collection Time    04/14/13 10:57 AM      Result Value Ref Range   Sodium 138  136 - 145 mEq/L   Potassium 4.3  3.5 - 5.1 mEq/L   Chloride 100  98 - 109 mEq/L   CO2 27  22 - 29 mEq/L   Glucose 147 (*) 70 - 140 mg/dl   BUN 17.5  7.0 - 26.0 mg/dL   Creatinine 0.7  0.7 - 1.3 mg/dL   Total Bilirubin 0.20  0.20 - 1.20 mg/dL   Alkaline Phosphatase 132  40 - 150 U/L   AST 29  5 - 34 U/L   ALT 28  0 - 55 U/L   Total Protein 6.9  6.4 - 8.3 g/dL   Albumin 1.9 (*) 3.5 - 5.0 g/dL   Calcium 9.0  8.4 - 10.4 mg/dL   Anion Gap 11  3 - 11 mEq/L  HOLD TUBE, BLOOD BANK     Status: None   Collection Time    04/14/13  1:06 PM      Result Value Ref Range   Hold Tube, Blood Bank Type and Crossmatch Added    TYPE AND SCREEN     Status: None   Collection Time    04/14/13  1:20 PM      Result Value Ref Range   ABO/RH(D) A POS     Antibody Screen NEG     Sample Expiration 04/17/2013     Unit Number [id number]     Blood Component Type RED CELLS,LR      Unit division 00     Status of Unit ISSUED,FINAL     Transfusion Status OK TO TRANSFUSE     Crossmatch Result Compatible     Unit Number [id number]     Blood Component Type RED CELLS,LR     Unit division 00     Status of Unit ISSUED,FINAL     Transfusion Status OK TO TRANSFUSE     Crossmatch Result Compatible    PREPARE RBC (CROSSMATCH)     Status: None   Collection Time    04/14/13  2:00 PM      Result Value Ref Range   Order Confirmation ORDER PROCESSED BY BLOOD BANK    CBC WITH DIFFERENTIAL     Status: Abnormal   Collection Time    04/19/13  9:16 AM      Result Value Ref Range   WBC 10.1  4.0 - 10.3 10e3/uL   NEUT# 7.9 (*) 1.5 - 6.5 10e3/uL   HGB 10.3 (*) 13.0 - 17.1 g/dL   HCT 31.4 (*) 38.4 - 49.9 %   Platelets 398  140 - 400 10e3/uL   MCV 88.2  79.3 - 98.0 fL   MCH 29.1  27.2 - 33.4 pg   MCHC 32.9  32.0 - 36.0 g/dL   RBC 3.56 (*) 4.20 - 5.82 10e6/uL   RDW 16.5 (*) 11.0 - 14.6 %   lymph# 0.7 (*) 0.9 - 3.3 10e3/uL   MONO# 1.3 (*) 0.1 - 0.9 10e3/uL   Eosinophils Absolute 0.0  0.0 - 0.5 10e3/uL   Basophils Absolute 0.0  0.0 - 0.1 10e3/uL   NEUT% 78.7 (*) 39.0 - 75.0 %  LYMPH% 7.2 (*) 14.0 - 49.0 %   MONO% 13.3  0.0 - 14.0 %   EOS% 0.3  0.0 - 7.0 %   BASO% 0.5  0.0 - 2.0 %  HOLD TUBE, BLOOD BANK     Status: None   Collection Time    04/19/13  9:16 AM      Result Value Ref Range   Hold Tube, Blood Bank Blood Bank Order Cancelled per [place], RN    COMPREHENSIVE METABOLIC PANEL (FB51)     Status: Abnormal   Collection Time    04/19/13  9:17 AM      Result Value Ref Range   Sodium 139  136 - 145 mEq/L   Potassium 4.1  3.5 - 5.1 mEq/L   Chloride 100  98 - 109 mEq/L   CO2 29  22 - 29 mEq/L   Glucose 152 (*) 70 - 140 mg/dl   BUN 28.0 (*) 7.0 - 26.0 mg/dL   Creatinine 0.8  0.7 - 1.3 mg/dL   Total Bilirubin 0.28  0.20 - 1.20 mg/dL   Alkaline Phosphatase 137  40 - 150 U/L   AST 46 (*) 5 - 34 U/L   ALT 64 (*) 0 - 55 U/L   Total Protein 7.0  6.4 - 8.3 g/dL    Albumin 2.2 (*) 3.5 - 5.0 g/dL   Calcium 9.0  8.4 - 10.4 mg/dL   Anion Gap 10  3 - 11 mEq/L  CBC WITH DIFFERENTIAL     Status: Abnormal   Collection Time    04/25/13  2:38 PM      Result Value Ref Range   WBC 7.5  4.0 - 10.3 10e3/uL   NEUT# 5.5  1.5 - 6.5 10e3/uL   HGB 8.7 (*) 13.0 - 17.1 g/dL   HCT 27.3 (*) 38.4 - 49.9 %   Platelets 312  140 - 400 10e3/uL   MCV 90.1  79.3 - 98.0 fL   MCH 28.7  27.2 - 33.4 pg   MCHC 31.8 (*) 32.0 - 36.0 g/dL   RBC 3.03 (*) 4.20 - 5.82 10e6/uL   RDW 18.7 (*) 11.0 - 14.6 %   lymph# 0.7 (*) 0.9 - 3.3 10e3/uL   MONO# 1.2 (*) 0.1 - 0.9 10e3/uL   Eosinophils Absolute 0.1  0.0 - 0.5 10e3/uL   Basophils Absolute 0.0  0.0 - 0.1 10e3/uL   NEUT% 73.7  39.0 - 75.0 %   LYMPH% 9.5 (*) 14.0 - 49.0 %   MONO% 15.4 (*) 0.0 - 14.0 %   EOS% 0.9  0.0 - 7.0 %   BASO% 0.5  0.0 - 2.0 %  COMPREHENSIVE METABOLIC PANEL (WC58)     Status: Abnormal   Collection Time    04/25/13  2:38 PM      Result Value Ref Range   Sodium 142  136 - 145 mEq/L   Potassium 3.9  3.5 - 5.1 mEq/L   Chloride 106  98 - 109 mEq/L   CO2 27  22 - 29 mEq/L   Glucose 85  70 - 140 mg/dl   BUN 26.0  7.0 - 26.0 mg/dL   Creatinine 0.9  0.7 - 1.3 mg/dL   Total Bilirubin 0.22  0.20 - 1.20 mg/dL   Alkaline Phosphatase 97  40 - 150 U/L   AST 15  5 - 34 U/L   ALT 22  0 - 55 U/L   Total Protein 6.5  6.4 - 8.3 g/dL   Albumin 2.1 (*)  3.5 - 5.0 g/dL   Calcium 8.5  8.4 - 10.4 mg/dL   Anion Gap 9  3 - 11 mEq/L  CBC WITH DIFFERENTIAL     Status: Abnormal   Collection Time    04/28/13 11:34 AM      Result Value Ref Range   WBC 8.8  4.0 - 10.3 10e3/uL   NEUT# 6.9 (*) 1.5 - 6.5 10e3/uL   HGB 8.4 (*) 13.0 - 17.1 g/dL   HCT 26.2 (*) 38.4 - 49.9 %   Platelets 280  140 - 400 10e3/uL   MCV 90.7  79.3 - 98.0 fL   MCH 28.9  27.2 - 33.4 pg   MCHC 31.9 (*) 32.0 - 36.0 g/dL   RBC 2.89 (*) 4.20 - 5.82 10e6/uL   RDW 18.8 (*) 11.0 - 14.6 %   lymph# 0.6 (*) 0.9 - 3.3 10e3/uL   MONO# 1.2 (*) 0.1 - 0.9 10e3/uL    Eosinophils Absolute 0.1  0.0 - 0.5 10e3/uL   Basophils Absolute 0.0  0.0 - 0.1 10e3/uL   NEUT% 78.0 (*) 39.0 - 75.0 %   LYMPH% 6.9 (*) 14.0 - 49.0 %   MONO% 13.6  0.0 - 14.0 %   EOS% 1.1  0.0 - 7.0 %   BASO% 0.4  0.0 - 2.0 %  COMPREHENSIVE METABOLIC PANEL (QD82)     Status: Abnormal   Collection Time    04/28/13 11:34 AM      Result Value Ref Range   Sodium 142  136 - 145 mEq/L   Potassium 3.8  3.5 - 5.1 mEq/L   Chloride 107  98 - 109 mEq/L   CO2 26  22 - 29 mEq/L   Glucose 105  70 - 140 mg/dl   BUN 19.0  7.0 - 26.0 mg/dL   Creatinine 0.7  0.7 - 1.3 mg/dL   Total Bilirubin 0.23  0.20 - 1.20 mg/dL   Alkaline Phosphatase 103  40 - 150 U/L   AST 15  5 - 34 U/L   ALT 14  0 - 55 U/L   Total Protein 6.7  6.4 - 8.3 g/dL   Albumin 2.0 (*) 3.5 - 5.0 g/dL   Calcium 9.0  8.4 - 10.4 mg/dL   Anion Gap 9  3 - 11 mEq/L  PREPARE RBC (CROSSMATCH)     Status: None   Collection Time    04/28/13 12:02 PM      Result Value Ref Range   Order Confirmation ORDER PROCESSED BY BLOOD BANK    TYPE AND SCREEN     Status: None   Collection Time    04/28/13 12:20 PM      Result Value Ref Range   ABO/RH(D) A POS     Antibody Screen NEG     Sample Expiration 05/01/2013     Unit Number [id number]     Blood Component Type RED CELLS,LR     Unit division 00     Status of Unit ISSUED,FINAL     Transfusion Status OK TO TRANSFUSE     Crossmatch Result Compatible    CBC WITH DIFFERENTIAL     Status: Abnormal   Collection Time    05/05/13  1:17 PM      Result Value Ref Range   WBC 5.9  4.0 - 10.3 10e3/uL   NEUT# 4.8  1.5 - 6.5 10e3/uL   HGB 8.5 (*) 13.0 - 17.1 g/dL   HCT 26.3 (*) 38.4 - 49.9 %  Platelets 198  140 - 400 10e3/uL   MCV 91.1  79.3 - 98.0 fL   MCH 29.6  27.2 - 33.4 pg   MCHC 32.5  32.0 - 36.0 g/dL   RBC 2.89 (*) 4.20 - 5.82 10e6/uL   RDW 17.6 (*) 11.0 - 14.6 %   lymph# 0.6 (*) 0.9 - 3.3 10e3/uL   MONO# 0.4  0.1 - 0.9 10e3/uL   Eosinophils Absolute 0.1  0.0 - 0.5 10e3/uL    Basophils Absolute 0.0  0.0 - 0.1 10e3/uL   NEUT% 81.0 (*) 39.0 - 75.0 %   LYMPH% 9.6 (*) 14.0 - 49.0 %   MONO% 7.6  0.0 - 14.0 %   EOS% 1.5  0.0 - 7.0 %   BASO% 0.3  0.0 - 2.0 %  COMPREHENSIVE METABOLIC PANEL (LS93)     Status: Abnormal   Collection Time    05/05/13  1:18 PM      Result Value Ref Range   Sodium 139  136 - 145 mEq/L   Potassium 3.8  3.5 - 5.1 mEq/L   Chloride 105  98 - 109 mEq/L   CO2 28  22 - 29 mEq/L   Glucose 115  70 - 140 mg/dl   BUN 24.2  7.0 - 26.0 mg/dL   Creatinine 0.8  0.7 - 1.3 mg/dL   Total Bilirubin 0.22  0.20 - 1.20 mg/dL   Alkaline Phosphatase 103  40 - 150 U/L   AST 17  5 - 34 U/L   ALT 14  0 - 55 U/L   Total Protein 6.5  6.4 - 8.3 g/dL   Albumin 2.2 (*) 3.5 - 5.0 g/dL   Calcium 8.8  8.4 - 10.4 mg/dL   Anion Gap 6  3 - 11 mEq/L  CBC WITH DIFFERENTIAL     Status: Abnormal   Collection Time    05/12/13  1:06 PM      Result Value Ref Range   WBC 3.8 (*) 4.0 - 10.3 10e3/uL   NEUT# 2.7  1.5 - 6.5 10e3/uL   HGB 8.1 (*) 13.0 - 17.1 g/dL   HCT 25.3 (*) 38.4 - 49.9 %   Platelets 149  140 - 400 10e3/uL   MCV 92.0  79.3 - 98.0 fL   MCH 29.6  27.2 - 33.4 pg   MCHC 32.2  32.0 - 36.0 g/dL   RBC 2.74 (*) 4.20 - 5.82 10e6/uL   RDW 18.2 (*) 11.0 - 14.6 %   lymph# 0.5 (*) 0.9 - 3.3 10e3/uL   MONO# 0.5  0.1 - 0.9 10e3/uL   Eosinophils Absolute 0.1  0.0 - 0.5 10e3/uL   Basophils Absolute 0.0  0.0 - 0.1 10e3/uL   NEUT% 71.9  39.0 - 75.0 %   LYMPH% 12.9 (*) 14.0 - 49.0 %   MONO% 12.8  0.0 - 14.0 %   EOS% 1.8  0.0 - 7.0 %   BASO% 0.6  0.0 - 2.0 %  COMPREHENSIVE METABOLIC PANEL (TD42)     Status: Abnormal   Collection Time    05/12/13  1:06 PM      Result Value Ref Range   Sodium 143  136 - 145 mEq/L   Potassium 3.9  3.5 - 5.1 mEq/L   Chloride 108  98 - 109 mEq/L   CO2 25  22 - 29 mEq/L   Glucose 123  70 - 140 mg/dl   BUN 22.8  7.0 - 26.0 mg/dL   Creatinine 0.8  0.7 -  1.3 mg/dL   Total Bilirubin <0.20  0.20 - 1.20 mg/dL   Alkaline Phosphatase 80  40  - 150 U/L   AST 13  5 - 34 U/L   ALT 8  0 - 55 U/L   Total Protein 6.3 (*) 6.4 - 8.3 g/dL   Albumin 2.3 (*) 3.5 - 5.0 g/dL   Calcium 8.9  8.4 - 10.4 mg/dL   Anion Gap 10  3 - 11 mEq/L  TYPE AND SCREEN     Status: None   Collection Time    05/12/13  2:45 PM      Result Value Ref Range   ABO/RH(D) A POS     Antibody Screen NEG     Sample Expiration 05/15/2013     Unit Number [id number]     Blood Component Type RED CELLS,LR     Unit division 00     Status of Unit ISSUED,FINAL     Transfusion Status OK TO TRANSFUSE     Crossmatch Result Compatible     Unit Number [id number]     Blood Component Type RED CELLS,LR     Unit division 00     Status of Unit ISSUED,FINAL     Transfusion Status OK TO TRANSFUSE     Crossmatch Result Compatible    PREPARE RBC (CROSSMATCH)     Status: None   Collection Time    05/12/13  3:30 PM      Result Value Ref Range   Order Confirmation ORDER PROCESSED BY BLOOD BANK    CBC WITH DIFFERENTIAL     Status: Abnormal   Collection Time    05/19/13  9:23 AM      Result Value Ref Range   WBC 3.2 (*) 4.0 - 10.3 10e3/uL   NEUT# 2.0  1.5 - 6.5 10e3/uL   HGB 9.6 (*) 13.0 - 17.1 g/dL   HCT 29.3 (*) 38.4 - 49.9 %   Platelets 120 (*) 140 - 400 10e3/uL   MCV 91.0  79.3 - 98.0 fL   MCH 29.7  27.2 - 33.4 pg   MCHC 32.6  32.0 - 36.0 g/dL   RBC 3.22 (*) 4.20 - 5.82 10e6/uL   RDW 18.2 (*) 11.0 - 14.6 %   lymph# 0.5 (*) 0.9 - 3.3 10e3/uL   MONO# 0.5  0.1 - 0.9 10e3/uL   Eosinophils Absolute 0.0  0.0 - 0.5 10e3/uL   Basophils Absolute 0.0  0.0 - 0.1 10e3/uL   NEUT% 64.3  39.0 - 75.0 %   LYMPH% 17.2  14.0 - 49.0 %   MONO% 15.9 (*) 0.0 - 14.0 %   EOS% 1.6  0.0 - 7.0 %   BASO% 1.0  0.0 - 2.0 %  TSH [facility]     Status: Abnormal   Collection Time    05/19/13  9:23 AM      Result Value Ref Range   TSH 13.837 (*) 0.320 - 4.118 m(IU)/L  COMPREHENSIVE METABOLIC PANEL (UL84)     Status: Abnormal   Collection Time    05/19/13  9:24 AM      Result Value Ref Range    Sodium 142  136 - 145 mEq/L   Potassium 4.0  3.5 - 5.1 mEq/L   Chloride 109  98 - 109 mEq/L   CO2 25  22 - 29 mEq/L   Glucose 89  70 - 140 mg/dl   BUN 21.3  7.0 - 26.0 mg/dL   Creatinine 0.8  0.7 - 1.3  mg/dL   Total Bilirubin <0.20  0.20 - 1.20 mg/dL   Alkaline Phosphatase 75  40 - 150 U/L   AST 12  5 - 34 U/L   ALT 8  0 - 55 U/L   Total Protein 6.1 (*) 6.4 - 8.3 g/dL   Albumin 2.3 (*) 3.5 - 5.0 g/dL   Calcium 9.0  8.4 - 10.4 mg/dL   Anion Gap 9  3 - 11 mEq/L  CBC WITH DIFFERENTIAL     Status: Abnormal   Collection Time    05/26/13  2:17 PM      Result Value Ref Range   WBC 4.1  4.0 - 10.3 10e3/uL   NEUT# 2.9  1.5 - 6.5 10e3/uL   HGB 9.0 (*) 13.0 - 17.1 g/dL   HCT 28.0 (*) 38.4 - 49.9 %   Platelets 118 (*) 140 - 400 10e3/uL   MCV 92.3  79.3 - 98.0 fL   MCH 29.6  27.2 - 33.4 pg   MCHC 32.1  32.0 - 36.0 g/dL   RBC 3.03 (*) 4.20 - 5.82 10e6/uL   RDW 18.8 (*) 11.0 - 14.6 %   lymph# 0.5 (*) 0.9 - 3.3 10e3/uL   MONO# 0.6  0.1 - 0.9 10e3/uL   Eosinophils Absolute 0.1  0.0 - 0.5 10e3/uL   Basophils Absolute 0.0  0.0 - 0.1 10e3/uL   NEUT% 71.6  39.0 - 75.0 %   LYMPH% 12.5 (*) 14.0 - 49.0 %   MONO% 13.8  0.0 - 14.0 %   EOS% 1.8  0.0 - 7.0 %   BASO% 0.3  0.0 - 2.0 %  COMPREHENSIVE METABOLIC PANEL (KN39)     Status: Abnormal   Collection Time    05/26/13  2:19 PM      Result Value Ref Range   Sodium 146 (*) 136 - 145 mEq/L   Potassium 3.8  3.5 - 5.1 mEq/L   Chloride 110 (*) 98 - 109 mEq/L   CO2 26  22 - 29 mEq/L   Glucose 112  70 - 140 mg/dl   BUN 25.7  7.0 - 26.0 mg/dL   Creatinine 0.8  0.7 - 1.3 mg/dL   Total Bilirubin <0.20  0.20 - 1.20 mg/dL   Alkaline Phosphatase 72  40 - 150 U/L   AST 15  5 - 34 U/L   ALT 8  0 - 55 U/L   Total Protein 6.2 (*) 6.4 - 8.3 g/dL   Albumin 2.5 (*) 3.5 - 5.0 g/dL   Calcium 9.0  8.4 - 10.4 mg/dL   Anion Gap 10  3 - 11 mEq/L  CBC WITH DIFFERENTIAL     Status: Abnormal   Collection Time    06/02/13  2:02 PM      Result Value Ref Range    WBC 3.9 (*) 4.0 - 10.3 10e3/uL   NEUT# 2.9  1.5 - 6.5 10e3/uL   HGB 8.2 (*) 13.0 - 17.1 g/dL   HCT 24.9 (*) 38.4 - 49.9 %   Platelets 115 (*) 140 - 400 10e3/uL   MCV 91.9  79.3 - 98.0 fL   MCH 30.3  27.2 - 33.4 pg   MCHC 33.0  32.0 - 36.0 g/dL   RBC 2.71 (*) 4.20 - 5.82 10e6/uL   RDW 18.9 (*) 11.0 - 14.6 %   lymph# 0.4 (*) 0.9 - 3.3 10e3/uL   MONO# 0.6  0.1 - 0.9 10e3/uL   Eosinophils Absolute 0.0  0.0 - 0.5 10e3/uL  Basophils Absolute 0.0  0.0 - 0.1 10e3/uL   NEUT% 74.4  39.0 - 75.0 %   LYMPH% 9.5 (*) 14.0 - 49.0 %   MONO% 14.7 (*) 0.0 - 14.0 %   EOS% 0.7  0.0 - 7.0 %   BASO% 0.7  0.0 - 2.0 %  COMPREHENSIVE METABOLIC PANEL (FU93)     Status: Abnormal   Collection Time    06/02/13  2:02 PM      Result Value Ref Range   Sodium 143  136 - 145 mEq/L   Potassium 3.8  3.5 - 5.1 mEq/L   Chloride 108  98 - 109 mEq/L   CO2 23  22 - 29 mEq/L   Glucose 111  70 - 140 mg/dl   BUN 26.7 (*) 7.0 - 26.0 mg/dL   Creatinine 1.0  0.7 - 1.3 mg/dL   Total Bilirubin <0.20  0.20 - 1.20 mg/dL   Alkaline Phosphatase 67  40 - 150 U/L   AST 15  5 - 34 U/L   ALT 11  0 - 55 U/L   Total Protein 6.1 (*) 6.4 - 8.3 g/dL   Albumin 2.6 (*) 3.5 - 5.0 g/dL   Calcium 8.8  8.4 - 10.4 mg/dL   Anion Gap 12 (*) 3 - 11 mEq/L  TYPE AND SCREEN     Status: None   Collection Time    06/02/13  3:43 PM      Result Value Ref Range   ABO/RH(D) A POS     Antibody Screen NEG     Sample Expiration 06/05/2013     Unit Number [id number]     Blood Component Type RED CELLS,LR     Unit division 00     Status of Unit ISSUED,FINAL     Transfusion Status OK TO TRANSFUSE     Crossmatch Result Compatible    PREPARE RBC (CROSSMATCH)     Status: None   Collection Time    06/04/13  3:03 PM      Result Value Ref Range   Order Confirmation ORDER PROCESSED BY BLOOD BANK    CBC WITH DIFFERENTIAL     Status: Abnormal   Collection Time    06/09/13  1:53 PM      Result Value Ref Range   WBC 3.9 (*) 4.0 - 10.3 10e3/uL    NEUT# 2.6  1.5 - 6.5 10e3/uL   HGB 9.1 (*) 13.0 - 17.1 g/dL   HCT 28.3 (*) 38.4 - 49.9 %   Platelets 125 (*) 140 - 400 10e3/uL   MCV 93.5  79.3 - 98.0 fL   MCH 30.2  27.2 - 33.4 pg   MCHC 32.3  32.0 - 36.0 g/dL   RBC 3.02 (*) 4.20 - 5.82 10e6/uL   RDW 19.1 (*) 11.0 - 14.6 %   lymph# 0.6 (*) 0.9 - 3.3 10e3/uL   MONO# 0.7  0.1 - 0.9 10e3/uL   Eosinophils Absolute 0.0  0.0 - 0.5 10e3/uL   Basophils Absolute 0.0  0.0 - 0.1 10e3/uL   NEUT% 66.5  39.0 - 75.0 %   LYMPH% 14.6  14.0 - 49.0 %   MONO% 17.3 (*) 0.0 - 14.0 %   EOS% 0.9  0.0 - 7.0 %   BASO% 0.7  0.0 - 2.0 %  COMPREHENSIVE METABOLIC PANEL (HC62)     Status: Abnormal   Collection Time    06/09/13  1:53 PM      Result Value Ref Range   Sodium  141  136 - 145 mEq/L   Potassium 3.8  3.5 - 5.1 mEq/L   Chloride 106  98 - 109 mEq/L   CO2 25  22 - 29 mEq/L   Glucose 151 (*) 70 - 140 mg/dl   BUN 19.1  7.0 - 26.0 mg/dL   Creatinine 0.8  0.7 - 1.3 mg/dL   Total Bilirubin 0.21  0.20 - 1.20 mg/dL   Alkaline Phosphatase 71  40 - 150 U/L   AST 13  5 - 34 U/L   ALT 9  0 - 55 U/L   Total Protein 6.4  6.4 - 8.3 g/dL   Albumin 2.7 (*) 3.5 - 5.0 g/dL   Calcium 8.8  8.4 - 10.4 mg/dL   Anion Gap 9  3 - 11 mEq/L  CBC WITH DIFFERENTIAL     Status: Abnormal   Collection Time    06/16/13  1:14 PM      Result Value Ref Range   WBC 3.6 (*) 4.0 - 10.3 10e3/uL   NEUT# 2.5  1.5 - 6.5 10e3/uL   HGB 8.3 (*) 13.0 - 17.1 g/dL   HCT 25.9 (*) 38.4 - 49.9 %   Platelets 99 (*) 140 - 400 10e3/uL   MCV 94.2  79.3 - 98.0 fL   MCH 30.2  27.2 - 33.4 pg   MCHC 32.0  32.0 - 36.0 g/dL   RBC 2.75 (*) 4.20 - 5.82 10e6/uL   RDW 18.3 (*) 11.0 - 14.6 %   lymph# 0.6 (*) 0.9 - 3.3 10e3/uL   MONO# 0.4  0.1 - 0.9 10e3/uL   Eosinophils Absolute 0.0  0.0 - 0.5 10e3/uL   Basophils Absolute 0.0  0.0 - 0.1 10e3/uL   NEUT% 70.4  39.0 - 75.0 %   LYMPH% 16.9  14.0 - 49.0 %   MONO% 11.3  0.0 - 14.0 %   EOS% 0.8  0.0 - 7.0 %   BASO% 0.6  0.0 - 2.0 %  COMPREHENSIVE  METABOLIC PANEL (HK74)     Status: Abnormal   Collection Time    06/16/13  1:14 PM      Result Value Ref Range   Sodium 142  136 - 145 mEq/L   Potassium 3.9  3.5 - 5.1 mEq/L   Chloride 108  98 - 109 mEq/L   CO2 24  22 - 29 mEq/L   Glucose 107  70 - 140 mg/dl   BUN 27.7 (*) 7.0 - 26.0 mg/dL   Creatinine 1.0  0.7 - 1.3 mg/dL   Total Bilirubin <0.20  0.20 - 1.20 mg/dL   Alkaline Phosphatase 66  40 - 150 U/L   AST 14  5 - 34 U/L   ALT 7  0 - 55 U/L   Total Protein 6.1 (*) 6.4 - 8.3 g/dL   Albumin 2.7 (*) 3.5 - 5.0 g/dL   Calcium 8.6  8.4 - 10.4 mg/dL   Anion Gap 9  3 - 11 mEq/L  PREPARE RBC (CROSSMATCH)     Status: None   Collection Time    06/16/13  4:00 PM      Result Value Ref Range   Order Confirmation ORDER PROCESSED BY BLOOD BANK    TYPE AND SCREEN     Status: None   Collection Time    06/16/13  4:00 PM      Result Value Ref Range   ABO/RH(D) A POS     Antibody Screen NEG     Sample Expiration 06/19/2013  Unit Number [id number]     Blood Component Type RED CELLS,LR     Unit division 00     Status of Unit ISSUED,FINAL     Transfusion Status OK TO TRANSFUSE     Crossmatch Result Compatible     Assessment/Plan: BPH (benign prostatic hyperplasia) Continue current regimen.  Will make referral back to Urology for insurance purposes.  Hypothyroidism Increase levothyroxine to 88 mcg.  Repeat TSH in 4-6 weeks.  Panic attack Continue Ativan PRN.    Small cell lung cancer Continue follow with Oncology.  Will monitor patient's response to therapy.

## 2013-06-16 NOTE — Patient Instructions (Signed)
[facility] Discharge Instructions for Patients Receiving Chemotherapy  Today you received the following chemotherapy agents Abraxane.  To help prevent nausea and vomiting after your treatment, we encourage you to take your nausea medication.   If you develop nausea and vomiting that is not controlled by your nausea medication, call the clinic.   BELOW ARE SYMPTOMS THAT SHOULD BE REPORTED IMMEDIATELY:  *FEVER GREATER THAN 100.5 F  *CHILLS WITH OR WITHOUT FEVER  NAUSEA AND VOMITING THAT IS NOT CONTROLLED WITH YOUR NAUSEA MEDICATION  *UNUSUAL SHORTNESS OF BREATH  *UNUSUAL BRUISING OR BLEEDING  TENDERNESS IN MOUTH AND THROAT WITH OR WITHOUT PRESENCE OF ULCERS  *URINARY PROBLEMS  *BOWEL PROBLEMS  UNUSUAL RASH Items with * indicate a potential emergency and should be followed up as soon as possible.  Feel free to call the clinic you have any questions or concerns. The clinic phone number is [phone].

## 2013-06-16 NOTE — Telephone Encounter (Signed)
MRI Pelvis cannot be scheduled until 6/18.  Per Dr Vista Mink, okay to have MRI done 6/18 and he will have the CT CAP done 1st and at that point it will be determined whether he needs the MRI.  Informed pt.  Vivien Rota in central scheduling will call with appt.  SLJ

## 2013-06-16 NOTE — Telephone Encounter (Signed)
Pt is in infusion.  [name] is requesting for pt to have increased fentanyl patch rx and to have a higher qty of the oxycodone.  He is still having pain in his hips and lower back.  His x-rays that were done last week for his x-rays and back were negative.  Pt is also asking for blood.  Pt states he is very fatigued.  Per Dr Vista Mink, he can have an increased qty of 60 tablets for the oxycodone.  He is not going to increase fentanyl at this time.  Okay to order an MRI of the pelvis to r/o any bone mets.  Pt states he is very claustrophobic and needs sedation.  Per Dr Vista Mink, okay to order sedation with MRI pelvis.  Per Dr Vista Mink, okay for pt to have 1 unit blood.  Appt made for Saturday, 06/18/13.  SLJ

## 2013-06-16 NOTE — Patient Instructions (Signed)
I have refilled your Proscar.  I have also sen in the new 88 mcg dose of you thyroid medication.  Take daily.  We will need to recheck your TSH in 4 weeks.  The cancer center will be able to help [place] with that.  You will be contacted once referrals have gone through. Please follow-up with specialists as scheduled.

## 2013-06-18 ENCOUNTER — Ambulatory Visit (HOSPITAL_BASED_OUTPATIENT_CLINIC_OR_DEPARTMENT_OTHER): Payer: Commercial Managed Care - HMO

## 2013-06-18 VITALS — BP 132/67 | HR 65 | Temp 98.6°F | Resp 18

## 2013-06-18 DIAGNOSIS — D649 Anemia, unspecified: Secondary | ICD-10-CM

## 2013-06-18 MED ORDER — ACETAMINOPHEN 325 MG PO TABS
ORAL_TABLET | ORAL | Status: AC
  Filled 2013-06-18: qty 2

## 2013-06-18 MED ORDER — SODIUM CHLORIDE 0.9 % IV SOLN
250.0000 mL | Freq: Once | INTRAVENOUS | Status: AC
  Administered 2013-06-18: 250 mL via INTRAVENOUS

## 2013-06-18 MED ORDER — SODIUM CHLORIDE 0.9 % IJ SOLN
10.0000 mL | INTRAMUSCULAR | Status: AC | PRN
Start: 2013-06-18 — End: 2013-06-18
  Administered 2013-06-18: 10 mL
  Filled 2013-06-18: qty 10

## 2013-06-18 MED ORDER — HEPARIN SOD (PORK) LOCK FLUSH 100 UNIT/ML IV SOLN
500.0000 [IU] | Freq: Every day | INTRAVENOUS | Status: AC | PRN
  Administered 2013-06-18: 500 [IU]
  Filled 2013-06-18: qty 5

## 2013-06-18 MED ORDER — DIPHENHYDRAMINE HCL 25 MG PO CAPS
25.0000 mg | ORAL_CAPSULE | Freq: Once | ORAL | Status: AC
  Administered 2013-06-18: 25 mg via ORAL

## 2013-06-18 MED ORDER — DIPHENHYDRAMINE HCL 25 MG PO CAPS
ORAL_CAPSULE | ORAL | Status: AC
  Filled 2013-06-18: qty 1

## 2013-06-18 MED ORDER — ACETAMINOPHEN 325 MG PO TABS
650.0000 mg | ORAL_TABLET | Freq: Once | ORAL | Status: AC
  Administered 2013-06-18: 650 mg via ORAL

## 2013-06-18 NOTE — Patient Instructions (Signed)
Blood Transfusion Information WHAT IS A BLOOD TRANSFUSION? A transfusion is the replacement of blood or some of its parts. Blood is made up of multiple cells which provide different functions.  Red blood cells carry oxygen and are used for blood loss replacement.  White blood cells fight against infection.  Platelets control bleeding.  Plasma helps clot blood.  Other blood products are available for specialized needs, such as hemophilia or other clotting disorders. BEFORE THE TRANSFUSION  Who gives blood for transfusions?   You may be able to donate blood to be used at a later date on yourself (autologous donation).  Relatives can be asked to donate blood. This is generally not any safer than if you have received blood from a stranger. The same precautions are taken to ensure safety when a relative's blood is donated.  Healthy volunteers who are fully evaluated to make sure their blood is safe. This is blood bank blood. Transfusion therapy is the safest it has ever been in the practice of medicine. Before blood is taken from a donor, a complete history is taken to make sure that person has no history of diseases nor engages in risky social behavior (examples are intravenous drug use or sexual activity with multiple partners). The donor's travel history is screened to minimize risk of transmitting infections, such as malaria. The donated blood is tested for signs of infectious diseases, such as HIV and hepatitis. The blood is then tested to be sure it is compatible with you in order to minimize the chance of a transfusion reaction. If you or a relative donates blood, this is often done in anticipation of surgery and is not appropriate for emergency situations. It takes many days to process the donated blood. RISKS AND COMPLICATIONS Although transfusion therapy is very safe and saves many lives, the main dangers of transfusion include:   Getting an infectious disease.  Developing a  transfusion reaction. This is an allergic reaction to something in the blood you were given. Every precaution is taken to prevent this. The decision to have a blood transfusion has been considered carefully by your caregiver before blood is given. Blood is not given unless the benefits outweigh the risks. AFTER THE TRANSFUSION  Right after receiving a blood transfusion, you will usually feel much better and more energetic. This is especially true if your red blood cells have gotten low (anemic). The transfusion raises the level of the red blood cells which carry oxygen, and this usually causes an energy increase.  The nurse administering the transfusion will monitor you carefully for complications. HOME CARE INSTRUCTIONS  No special instructions are needed after a transfusion. You may find your energy is better. Speak with your caregiver about any limitations on activity for underlying diseases you may have. SEEK MEDICAL CARE IF:   Your condition is not improving after your transfusion.  You develop redness or irritation at the intravenous (IV) site. SEEK IMMEDIATE MEDICAL CARE IF:  Any of the following symptoms occur over the next 12 hours:  Shaking chills.  You have a temperature by mouth above 102 F (38.9 C), not controlled by medicine.  Chest, back, or muscle pain.  People around you feel you are not acting correctly or are confused.  Shortness of breath or difficulty breathing.  Dizziness and fainting.  You get a rash or develop hives.  You have a decrease in urine output.  Your urine turns a dark color or changes to pink, red, or brown. Any of the following   symptoms occur over the next 10 days:  You have a temperature by mouth above 102 F (38.9 C), not controlled by medicine.  Shortness of breath.  Weakness after normal activity.  The white part of the eye turns yellow (jaundice).  You have a decrease in the amount of urine or are urinating less often.  Your  urine turns a dark color or changes to pink, red, or brown. Document Released: 01/04/2000 Document Revised: 03/31/2011 Document Reviewed: 08/23/2007 Exit[employer] Patient Information 2014 [employer].  

## 2013-06-19 LAB — TYPE AND SCREEN
ABO/RH(D): A POS
ANTIBODY SCREEN: NEGATIVE
UNIT DIVISION: 0

## 2013-06-20 DIAGNOSIS — N400 Enlarged prostate without lower urinary tract symptoms: Secondary | ICD-10-CM | POA: Insufficient documentation

## 2013-06-20 DIAGNOSIS — E039 Hypothyroidism, unspecified: Secondary | ICD-10-CM | POA: Insufficient documentation

## 2013-06-20 NOTE — Assessment & Plan Note (Signed)
Continue current regimen.  Will make referral back to Urology for insurance purposes.

## 2013-06-20 NOTE — Assessment & Plan Note (Signed)
Continue Ativan PRN.

## 2013-06-20 NOTE — Assessment & Plan Note (Signed)
Increase levothyroxine to 88 mcg.  Repeat TSH in 4-6 weeks.

## 2013-06-20 NOTE — Assessment & Plan Note (Signed)
Continue follow with Oncology.  Will monitor patient's response to therapy.

## 2013-06-23 ENCOUNTER — Ambulatory Visit: Payer: Commercial Managed Care - HMO

## 2013-06-23 ENCOUNTER — Other Ambulatory Visit: Payer: Commercial Managed Care - HMO

## 2013-06-23 ENCOUNTER — Other Ambulatory Visit (HOSPITAL_BASED_OUTPATIENT_CLINIC_OR_DEPARTMENT_OTHER): Payer: Commercial Managed Care - HMO

## 2013-06-23 ENCOUNTER — Other Ambulatory Visit: Payer: Self-pay | Admitting: Medical Oncology

## 2013-06-23 DIAGNOSIS — C342 Malignant neoplasm of middle lobe, bronchus or lung: Secondary | ICD-10-CM

## 2013-06-23 DIAGNOSIS — Z95828 Presence of other vascular implants and grafts: Secondary | ICD-10-CM

## 2013-06-23 DIAGNOSIS — C3491 Malignant neoplasm of unspecified part of right bronchus or lung: Secondary | ICD-10-CM

## 2013-06-23 DIAGNOSIS — C3490 Malignant neoplasm of unspecified part of unspecified bronchus or lung: Secondary | ICD-10-CM

## 2013-06-23 LAB — COMPREHENSIVE METABOLIC PANEL (CC13)
ALT: 9 U/L (ref 0–55)
ANION GAP: 10 meq/L (ref 3–11)
AST: 14 U/L (ref 5–34)
Albumin: 2.6 g/dL — ABNORMAL LOW (ref 3.5–5.0)
Alkaline Phosphatase: 70 U/L (ref 40–150)
BILIRUBIN TOTAL: 0.22 mg/dL (ref 0.20–1.20)
BUN: 19.9 mg/dL (ref 7.0–26.0)
CO2: 24 meq/L (ref 22–29)
CREATININE: 0.8 mg/dL (ref 0.7–1.3)
Calcium: 8.5 mg/dL (ref 8.4–10.4)
Chloride: 108 mEq/L (ref 98–109)
GLUCOSE: 101 mg/dL (ref 70–140)
Potassium: 4 mEq/L (ref 3.5–5.1)
Sodium: 142 mEq/L (ref 136–145)
Total Protein: 6 g/dL — ABNORMAL LOW (ref 6.4–8.3)

## 2013-06-23 LAB — CBC WITH DIFFERENTIAL/PLATELET
BASO%: 0.5 % (ref 0.0–2.0)
Basophils Absolute: 0 10*3/uL (ref 0.0–0.1)
EOS ABS: 0 10*3/uL (ref 0.0–0.5)
EOS%: 0.8 % (ref 0.0–7.0)
HEMATOCRIT: 25.9 % — AB (ref 38.4–49.9)
HGB: 8.5 g/dL — ABNORMAL LOW (ref 13.0–17.1)
LYMPH%: 15.3 % (ref 14.0–49.0)
MCH: 31.3 pg (ref 27.2–33.4)
MCHC: 32.8 g/dL (ref 32.0–36.0)
MCV: 95.2 fL (ref 79.3–98.0)
MONO#: 0.6 10*3/uL (ref 0.1–0.9)
MONO%: 14.5 % — ABNORMAL HIGH (ref 0.0–14.0)
NEUT#: 2.8 10*3/uL (ref 1.5–6.5)
NEUT%: 68.9 % (ref 39.0–75.0)
PLATELETS: 84 10*3/uL — AB (ref 140–400)
RBC: 2.72 10*6/uL — ABNORMAL LOW (ref 4.20–5.82)
RDW: 17.3 % — ABNORMAL HIGH (ref 11.0–14.6)
WBC: 4 10*3/uL (ref 4.0–10.3)
lymph#: 0.6 10*3/uL — ABNORMAL LOW (ref 0.9–3.3)

## 2013-06-23 MED ORDER — SODIUM CHLORIDE 0.9 % IJ SOLN
10.0000 mL | INTRAMUSCULAR | Status: DC | PRN
  Administered 2013-06-23: 10 mL via INTRAVENOUS
  Filled 2013-06-23: qty 10

## 2013-06-23 MED ORDER — HEPARIN SOD (PORK) LOCK FLUSH 100 UNIT/ML IV SOLN
500.0000 [IU] | Freq: Once | INTRAVENOUS | Status: AC
  Administered 2013-06-23: 500 [IU] via INTRAVENOUS
  Filled 2013-06-23: qty 5

## 2013-06-23 MED ORDER — OXYCODONE HCL 5 MG PO TABS: 5.0000 mg | ORAL_TABLET | ORAL | Status: DC | PRN

## 2013-06-23 NOTE — Progress Notes (Signed)
Pt here for chemo.  Dr. Julien Nordmann reviewed lab results today.  No chemo today as per md due to low platelet counts.   Pt denied short of breath.    Pt to return next week for office visit and chemo as scheduled.  Explanations given to pt and wife.  Both voiced understanding.

## 2013-06-23 NOTE — Telephone Encounter (Signed)
refill rx given to pt.

## 2013-06-24 ENCOUNTER — Other Ambulatory Visit: Payer: Self-pay | Admitting: *Deleted

## 2013-06-24 DIAGNOSIS — K5900 Constipation, unspecified: Secondary | ICD-10-CM

## 2013-06-24 MED ORDER — DOCUSATE SODIUM 100 MG PO CAPS: 100.0000 mg | ORAL_CAPSULE | Freq: Two times a day (BID) | ORAL | Status: AC

## 2013-06-28 ENCOUNTER — Encounter (HOSPITAL_COMMUNITY): Payer: Self-pay

## 2013-06-28 ENCOUNTER — Ambulatory Visit (HOSPITAL_COMMUNITY)
Admission: RE | Admit: 2013-06-28 | Discharge: 2013-06-28 | Disposition: A | Discharge status: Home / Self Care | Payer: Medicare HMO | Source: Ambulatory Visit | Attending: Physician Assistant | Admitting: Physician Assistant

## 2013-06-28 DIAGNOSIS — M47817 Spondylosis without myelopathy or radiculopathy, lumbosacral region: Secondary | ICD-10-CM | POA: Insufficient documentation

## 2013-06-28 DIAGNOSIS — K5730 Diverticulosis of large intestine without perforation or abscess without bleeding: Secondary | ICD-10-CM | POA: Insufficient documentation

## 2013-06-28 DIAGNOSIS — C342 Malignant neoplasm of middle lobe, bronchus or lung: Secondary | ICD-10-CM

## 2013-06-28 DIAGNOSIS — K7689 Other specified diseases of liver: Secondary | ICD-10-CM | POA: Insufficient documentation

## 2013-06-28 DIAGNOSIS — J869 Pyothorax without fistula: Secondary | ICD-10-CM | POA: Insufficient documentation

## 2013-06-28 DIAGNOSIS — C3490 Malignant neoplasm of unspecified part of unspecified bronchus or lung: Secondary | ICD-10-CM | POA: Insufficient documentation

## 2013-06-28 DIAGNOSIS — I700 Atherosclerosis of aorta: Secondary | ICD-10-CM | POA: Insufficient documentation

## 2013-06-28 DIAGNOSIS — Z931 Gastrostomy status: Secondary | ICD-10-CM | POA: Insufficient documentation

## 2013-06-28 DIAGNOSIS — R911 Solitary pulmonary nodule: Secondary | ICD-10-CM | POA: Insufficient documentation

## 2013-06-28 DIAGNOSIS — C61 Malignant neoplasm of prostate: Secondary | ICD-10-CM | POA: Insufficient documentation

## 2013-06-28 DIAGNOSIS — C3491 Malignant neoplasm of unspecified part of right bronchus or lung: Secondary | ICD-10-CM

## 2013-06-28 DIAGNOSIS — J9819 Other pulmonary collapse: Secondary | ICD-10-CM | POA: Insufficient documentation

## 2013-06-28 LAB — COMPREHENSIVE METABOLIC PANEL (CC13)
ALT: 11 U/L (ref 0–55)
ANION GAP: 8 meq/L (ref 3–11)
AST: 15 U/L (ref 5–34)
Albumin: 2.7 g/dL — ABNORMAL LOW (ref 3.5–5.0)
Alkaline Phosphatase: 83 U/L (ref 40–150)
BILIRUBIN TOTAL: 0.33 mg/dL (ref 0.20–1.20)
BUN: 14.3 mg/dL (ref 7.0–26.0)
CO2: 27 meq/L (ref 22–29)
Calcium: 8.9 mg/dL (ref 8.4–10.4)
Chloride: 102 mEq/L (ref 98–109)
Creatinine: 0.8 mg/dL (ref 0.7–1.3)
GLUCOSE: 93 mg/dL (ref 70–140)
Potassium: 3.9 mEq/L (ref 3.5–5.1)
SODIUM: 138 meq/L (ref 136–145)
TOTAL PROTEIN: 6.7 g/dL (ref 6.4–8.3)

## 2013-06-28 LAB — CBC WITH DIFFERENTIAL/PLATELET
BASO%: 0.7 % (ref 0.0–2.0)
Basophils Absolute: 0 10*3/uL (ref 0.0–0.1)
EOS%: 0.4 % (ref 0.0–7.0)
Eosinophils Absolute: 0 10*3/uL (ref 0.0–0.5)
HCT: 26.6 % — ABNORMAL LOW (ref 38.4–49.9)
HGB: 8.9 g/dL — ABNORMAL LOW (ref 13.0–17.1)
LYMPH%: 16.1 % (ref 14.0–49.0)
MCH: 31.8 pg (ref 27.2–33.4)
MCHC: 33.5 g/dL (ref 32.0–36.0)
MCV: 95.1 fL (ref 79.3–98.0)
MONO#: 1 10*3/uL — AB (ref 0.1–0.9)
MONO%: 24.4 % — AB (ref 0.0–14.0)
NEUT%: 58.4 % (ref 39.0–75.0)
NEUTROS ABS: 2.4 10*3/uL (ref 1.5–6.5)
PLATELETS: 115 10*3/uL — AB (ref 140–400)
RBC: 2.8 10*6/uL — AB (ref 4.20–5.82)
RDW: 19.3 % — ABNORMAL HIGH (ref 11.0–14.6)
WBC: 4.2 10*3/uL (ref 4.0–10.3)
lymph#: 0.7 10*3/uL — ABNORMAL LOW (ref 0.9–3.3)

## 2013-06-28 MED ORDER — IOHEXOL 300 MG/ML  SOLN
100.0000 mL | Freq: Once | INTRAMUSCULAR | Status: AC | PRN
  Administered 2013-06-28: 100 mL via INTRAVENOUS

## 2013-06-28 NOTE — Pre-Procedure Instructions (Addendum)
Samuel Hurley  06/28/2013   Your procedure is scheduled on: Thursday, July 07, 2013  Report to [facility] (use Main Entrance "A'') at 6:00 AM.  Call this number if you have problems the morning of surgery: [phone]   Remember:   Do not eat food or drink liquids after midnight.   Take these medicines the morning of surgery with A SIP OF WATER: gabapentin (NEURONTIN)  levothyroxine (SYNTHROID, terazosin (HYTRIN), mometasone (NASONEX) nasal spray if needed: pain medication, LORazepam (ATIVAN) for anxiety, ondansetron (ZOFRAN) for nausea or vomiting        Take all meds as ordered until day of surgery except as instructed below or per dr       Bridgette Habermann all herbel meds, nsaids (aleve,naproxen,advil,ibuprofen) 5 days prior to surgery(07/02/13) including vitamins, aspirin , supplements.  Do not wear jewelry, make-up or nail polish.  Do not wear lotions, powders, or perfumes. You may wear deodorant.  Do not shave 48 hours prior to surgery. Men may shave face and neck.  Do not bring valuables to the hospital.  [facility] is not responsible  for any belongings or valuables.               Contacts, dentures or bridgework may not be worn into surgery.  Leave suitcase in the car. After surgery it may be brought to your room.  For patients admitted to the hospital, discharge time is determined by your  treatment team.               Patients discharged the day of surgery will not be allowed to drive home.  Name and phone number of your driver:   Special Instructions:  Special Instructions:Special Instructions: [facility] - Preparing for Surgery  Before surgery, you can play an important role.  Because skin is not sterile, your skin needs to be as free of germs as possible.  You can reduce the number of germs on you skin by washing with CHG (chlorahexidine gluconate) soap before surgery.  CHG is an antiseptic cleaner which kills germs and bonds with the skin to continue killing germs  even after washing.  Please DO NOT use if you have an allergy to CHG or antibacterial soaps.  If your skin becomes reddened/irritated stop using the CHG and inform your nurse when you arrive at Short Stay.  Do not shave (including legs and underarms) for at least 48 hours prior to the first CHG shower.  You may shave your face.  Please follow these instructions carefully:   1.  Shower with CHG Soap the night before surgery and the morning of Surgery.  2.  If you choose to wash your hair, wash your hair first as usual with your normal shampoo.  3.  After you shampoo, rinse your hair and body thoroughly to remove the Shampoo.  4.  Use CHG as you would any other liquid soap.  You can apply chg directly  to the skin and wash gently with scrungie or a clean washcloth.  5.  Apply the CHG Soap to your body ONLY FROM THE NECK DOWN.  Do not use on open wounds or open sores.  Avoid contact with your eyes, ears, mouth and genitals (private parts).  Wash genitals (private parts) with your normal soap.  6.  Wash thoroughly, paying special attention to the area where your surgery will be performed.  7.  Thoroughly rinse your body with warm water from the neck down.  8.  DO NOT shower/wash with your normal soap after using and rinsing off the CHG Soap.  9.  Pat yourself dry with a clean towel.            10.  Wear clean pajamas.            11.  Place clean sheets on your bed the night of your first shower and do not sleep with pets.  Day of Surgery  Do not apply any lotions/the morning of surgery.  Please wear clean clothes to the hospital/surgery center.   Please read over the following fact sheets that you were given: Pain Booklet, Coughing and Deep Breathing and Surgical Site Infection Prevention

## 2013-06-29 ENCOUNTER — Encounter (HOSPITAL_COMMUNITY)
Admission: RE | Admit: 2013-06-29 | Discharge: 2013-06-29 | Disposition: A | Discharge status: Home / Self Care | Payer: Medicare HMO | Source: Ambulatory Visit | Attending: Internal Medicine | Admitting: Internal Medicine

## 2013-06-29 ENCOUNTER — Encounter (HOSPITAL_COMMUNITY): Payer: Self-pay

## 2013-06-29 ENCOUNTER — Encounter (HOSPITAL_COMMUNITY): Payer: Self-pay | Admitting: Pharmacy Technician

## 2013-06-29 DIAGNOSIS — Z01818 Encounter for other preprocedural examination: Secondary | ICD-10-CM | POA: Insufficient documentation

## 2013-06-29 HISTORY — DX: Cardiac murmur, unspecified: R01.1

## 2013-06-29 HISTORY — DX: Adverse effect of unspecified anesthetic, initial encounter: T41.45XA

## 2013-06-29 HISTORY — DX: Claustrophobia: F40.240

## 2013-06-29 HISTORY — DX: Other complications of anesthesia, initial encounter: T88.59XA

## 2013-06-29 HISTORY — DX: Shortness of breath: R06.02

## 2013-06-29 HISTORY — DX: Unspecified asthma, uncomplicated: J45.909

## 2013-06-29 HISTORY — DX: Anemia, unspecified: D64.9

## 2013-06-29 HISTORY — DX: Pneumonia, unspecified organism: J18.9

## 2013-06-29 HISTORY — DX: Hypothyroidism, unspecified: E03.9

## 2013-06-29 HISTORY — DX: Headache: R51

## 2013-06-30 ENCOUNTER — Telehealth: Payer: Self-pay | Admitting: Internal Medicine

## 2013-06-30 ENCOUNTER — Encounter: Payer: Self-pay | Admitting: Internal Medicine

## 2013-06-30 ENCOUNTER — Other Ambulatory Visit: Payer: Commercial Managed Care - HMO

## 2013-06-30 ENCOUNTER — Other Ambulatory Visit (HOSPITAL_BASED_OUTPATIENT_CLINIC_OR_DEPARTMENT_OTHER): Payer: Commercial Managed Care - HMO

## 2013-06-30 ENCOUNTER — Ambulatory Visit: Payer: Commercial Managed Care - HMO

## 2013-06-30 ENCOUNTER — Ambulatory Visit (HOSPITAL_BASED_OUTPATIENT_CLINIC_OR_DEPARTMENT_OTHER): Payer: Commercial Managed Care - HMO

## 2013-06-30 ENCOUNTER — Ambulatory Visit (HOSPITAL_BASED_OUTPATIENT_CLINIC_OR_DEPARTMENT_OTHER): Payer: Commercial Managed Care - HMO | Admitting: Internal Medicine

## 2013-06-30 VITALS — BP 144/61 | HR 92 | Temp 99.5°F | Resp 18 | Ht 72.0 in | Wt 167.9 lb

## 2013-06-30 VITALS — BP 143/63 | HR 91 | Temp 97.8°F | Resp 18

## 2013-06-30 DIAGNOSIS — C342 Malignant neoplasm of middle lobe, bronchus or lung: Secondary | ICD-10-CM

## 2013-06-30 DIAGNOSIS — Z5111 Encounter for antineoplastic chemotherapy: Secondary | ICD-10-CM

## 2013-06-30 DIAGNOSIS — T451X5A Adverse effect of antineoplastic and immunosuppressive drugs, initial encounter: Secondary | ICD-10-CM

## 2013-06-30 DIAGNOSIS — C3491 Malignant neoplasm of unspecified part of right bronchus or lung: Secondary | ICD-10-CM

## 2013-06-30 DIAGNOSIS — C3490 Malignant neoplasm of unspecified part of unspecified bronchus or lung: Secondary | ICD-10-CM

## 2013-06-30 DIAGNOSIS — D6481 Anemia due to antineoplastic chemotherapy: Secondary | ICD-10-CM

## 2013-06-30 DIAGNOSIS — C384 Malignant neoplasm of pleura: Secondary | ICD-10-CM

## 2013-06-30 DIAGNOSIS — R52 Pain, unspecified: Secondary | ICD-10-CM

## 2013-06-30 DIAGNOSIS — G609 Hereditary and idiopathic neuropathy, unspecified: Secondary | ICD-10-CM

## 2013-06-30 DIAGNOSIS — D702 Other drug-induced agranulocytosis: Secondary | ICD-10-CM

## 2013-06-30 MED ORDER — SODIUM CHLORIDE 0.9 % IV SOLN
Freq: Once | INTRAVENOUS | Status: AC
  Administered 2013-06-30: 14:00:00 via INTRAVENOUS

## 2013-06-30 MED ORDER — PACLITAXEL PROTEIN-BOUND CHEMO INJECTION 100 MG
80.0000 mg/m2 | Freq: Once | INTRAVENOUS | Status: AC
  Administered 2013-06-30: 150 mg via INTRAVENOUS
  Filled 2013-06-30: qty 30

## 2013-06-30 MED ORDER — ONDANSETRON 16 MG/50ML IVPB (CHCC)
INTRAVENOUS | Status: AC
  Filled 2013-06-30: qty 16

## 2013-06-30 MED ORDER — CARBOPLATIN CHEMO INTRADERMAL TEST DOSE 100MCG/0.02ML
100.0000 ug | Freq: Once | INTRADERMAL | Status: AC
  Administered 2013-06-30: 100 ug via INTRADERMAL
  Filled 2013-06-30: qty 0.01

## 2013-06-30 MED ORDER — LORAZEPAM 1 MG PO TABS
ORAL_TABLET | ORAL | Status: AC
  Filled 2013-06-30: qty 1

## 2013-06-30 MED ORDER — LORAZEPAM 1 MG PO TABS
0.5000 mg | ORAL_TABLET | Freq: Once | ORAL | Status: AC
  Administered 2013-06-30: 0.5 mg via ORAL

## 2013-06-30 MED ORDER — ONDANSETRON 16 MG/50ML IVPB (CHCC)
16.0000 mg | Freq: Once | INTRAVENOUS | Status: AC
  Administered 2013-06-30: 16 mg via INTRAVENOUS

## 2013-06-30 MED ORDER — SODIUM CHLORIDE 0.9 % IJ SOLN
10.0000 mL | INTRAMUSCULAR | Status: DC | PRN
  Administered 2013-06-30: 10 mL
  Filled 2013-06-30: qty 10

## 2013-06-30 MED ORDER — DEXAMETHASONE SODIUM PHOSPHATE 20 MG/5ML IJ SOLN
20.0000 mg | Freq: Once | INTRAMUSCULAR | Status: AC
  Administered 2013-06-30: 20 mg via INTRAVENOUS

## 2013-06-30 MED ORDER — HEPARIN SOD (PORK) LOCK FLUSH 100 UNIT/ML IV SOLN
500.0000 [IU] | Freq: Once | INTRAVENOUS | Status: AC | PRN
  Administered 2013-06-30: 500 [IU]
  Filled 2013-06-30: qty 5

## 2013-06-30 MED ORDER — OXYCODONE HCL 5 MG PO TABS: 5.0000 mg | ORAL_TABLET | ORAL | Status: DC | PRN

## 2013-06-30 MED ORDER — SODIUM CHLORIDE 0.9 % IV SOLN
420.0000 mg | Freq: Once | INTRAVENOUS | Status: DC
  Filled 2013-06-30: qty 42

## 2013-06-30 MED ORDER — DEXAMETHASONE SODIUM PHOSPHATE 20 MG/5ML IJ SOLN
INTRAMUSCULAR | Status: AC
  Filled 2013-06-30: qty 5

## 2013-06-30 NOTE — Patient Instructions (Signed)
[facility] Discharge Instructions for Patients Receiving Chemotherapy  Today you received the following chemotherapy agents Abraxane, Carboplatin.  To help prevent nausea and vomiting after your treatment, we encourage you to take your nausea medication as prescribed.   If you develop nausea and vomiting that is not controlled by your nausea medication, call the clinic.   BELOW ARE SYMPTOMS THAT SHOULD BE REPORTED IMMEDIATELY:  *FEVER GREATER THAN 100.5 F  *CHILLS WITH OR WITHOUT FEVER  NAUSEA AND VOMITING THAT IS NOT CONTROLLED WITH YOUR NAUSEA MEDICATION  *UNUSUAL SHORTNESS OF BREATH  *UNUSUAL BRUISING OR BLEEDING  TENDERNESS IN MOUTH AND THROAT WITH OR WITHOUT PRESENCE OF ULCERS  *URINARY PROBLEMS  *BOWEL PROBLEMS  UNUSUAL RASH Items with * indicate a potential emergency and should be followed up as soon as possible.  Feel free to call the clinic you have any questions or concerns. The clinic phone number is [phone].

## 2013-06-30 NOTE — Progress Notes (Signed)
Anesthesia chart review: Patient is a 79 year old male scheduled for MRI with anesthesia monitored care on 07/07/2013. He has severe claustrophobia. He has a history of lung and pharyngeal cancer. MRI was ordered to further evaluate for hip and lower back pain to rule out mets. His oncologist Dr. Julien Nordmann is listed as the ordering physician, with last [facility] visit with Awilda Metro, PA-C within the last 30 days.   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 Renal cancer s/p RFA '08 is listed but notes indicate that patient denied this. He is recently established with Leeanne Rio, PA-C on 06/16/13. Pulmonologist is Dr. Elsworth Soho.   Chest CT on 06/28/13 showed: Postprocedural changes with volume loss in the right hemithorax. Small mediastinal nodes measuring up to 11 mm short axis, grossly unchanged. Suspected rounded atelectasis in the right middle and lower lobes. Chronic right lung empyema, mildly decreased. 5 mm left lower lobe pulmonary nodule, unchanged. No evidence of metastatic disease in the abdomen/pelvis.  Labs from 06/28/13 noted. H/H 8.9/26.6, up from 8.5/25.9 on 06/23/13.  PLT count 115K, up from 84K on 06/23/13. For an MRI, I think his current labs would be acceptable.  He will be further evaluated by his assigned anesthesiologist on the day of surgery to discuss the definitive anesthesia plan.  George Hugh [facility] Short Stay Center/Anesthesiology Phone [phone] 06/30/2013 1:21 PM

## 2013-06-30 NOTE — Progress Notes (Signed)
1540: Pt reports not feeling well. States he is nauseated and feels unusually hot. Pt wants to go home. States feeling started after he gave himself tube feedings. Abraxane finished and line flushed. Dr. Julien Nordmann notified. 0.5 mg ativan given per MD verbal order. Pt refused to go to ED.   1610: Dr. Julien Nordmann at bedside to assess pt. Vital signs stable.  MD okay with pt being discharged and not receiving Carbo treatment.  Pt discharged home with wife via wheelchair. Pt and wife educated on if symptoms were to persist or worsen to go to ED.

## 2013-06-30 NOTE — Progress Notes (Signed)
[place] [phone]) [phone]   [phone]  OFFICE VISIT PROGRESS NOTE  Leeanne Rio, PA-C [address] [place] [address]  DIAGNOSIS AND STAGE:  1) Poorly differentiated non-small cell lung cancer diagnosed in December of 2014 2) Extensive stage small cell lung cancer diagnosed in July 2014.  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 4)  History of oncocytic renal tumor status post radiofrequency ablation at [facility] in August of 2008.  PRIOR THERAPY: Systemic chemotherapy with carboplatin for AUC of 5 on day 1 and etoposide 120 mg/M2 on days 1, 2 and 3 with Neulasta support on day 4, status post 4 cycles. First dose was given on 08/18/2012.    CURRENT THERAPY: Systemic chemotherapy with carboplatin for AUC of 5 on day 1 and Abraxane 80 mg/M2 on days 1, 8 and 15 every 3 weeks, status post 5 cycles.  INTERVAL HISTORY: Samuel Hurley 79 y.o. male returns to the clinic today for followup visit accompanied by his wife Samuel Hurley.   On 01/29/2013, The patient is feeling much better today with no specific complaints except for the occasional right-sided chest pain and this is well-controlled with his current pain medication with fentanyl patch 50 mcg/hour every 3 days in addition to Percocet on as-needed basis. He takes around 2-3 Percocet on daily basis for breakthrough pain. His shortness of breath is much improved. The patient continues to have drainage from the Pleuryx catheter but this is down to less than 50 cc every 3 days and he is expected to have his Pleurx catheter removed and few weeks by Dr. Kerin Ransom at [facility]. He recently underwent biopsy of one of the right pleural-based nodule at [facility] and the final pathology was consistent with poorly differentiated non-small cell carcinoma. The tumor cells are of small size with hyperchromatic nuclei and some molded forms. Many of the cells have abundant cytoplasm and nuclei with prominent nucleoli. Strong immunoreactivity is observed for anti-keratins and CK5/6, with negative staining observed for calretinin, WT-1, CK7, desmin, TTF-1 , Fli-1, CD99 and CD56. The entirety of these findings is most consistent with non-small carcinoma, with immunohistochemical evidence of rudimentary squamous differentiation. Bright pankeratin/ CK5/6 immunoreactivity with negative staining for TTF-1 and CD56 is not typical of small cell carcinoma.  He was seen recently by Dr. Sharlet Salina who sent him to be treated locally for the recently diagnosed non-small cell carcinoma as the patient is not a candidate for the clinical trial with Ipilumomab and Nivolumab for the second line option for the previously diagnosed small cell lung cancer. The patient was also seen recently at [facility] by Dr. Lewanda Rife who discussed within palliative systemic chemotherapy for his condition. The patient is not accepting the fact that he has an incurable condition and he still shopping around for other treatment options. He also requested a referral to the [facility]. He came today for evaluation and discussion of his treatment options after the recent diagnosis of non-small cell carcinoma.  On 02/21/2013, the patient present today for evaluation of his condition. He was seen by her medical oncologist in the last few weeks including Dr. Harlow Asa at [facility] in [facility] as well as  a thoracic oncologist at [facility] in [place]. The visit records from Dr. Harlow Asa are currently available to me and he is in agreement with the current evaluation of Mr. Samuel Hurley regarding dealing with 2 different lung cancer  including small cell lung cancer which was treated with carboplatin and etoposide in addition to recently diagnosed poorly differentiated non-small cell carcinoma. He also concurs with the treatment options provided for Mr. Samuel Hurley including treatment with carboplatin and Abraxane. The patient is here today for evaluation and consideration of starting treatment for his newly diagnosed poorly differentiated non-small cell lung cancer. He is feeling much better today with no specific complaints except for mild fatigue. His Pleurx catheter drains less than 75 cc of pleural fluid every 3 days. The molecular biomarkers performed at [facility] were negative for EGFR mutation and the ALK gene translocation.  On 03/17/2013, the patient is here today for evaluation accompanied by his wife. He tolerated the first cycle of his systemic chemotherapy with carboplatin and Abraxane fairly well with no significant adverse effects. He denied having any significant nausea or vomiting, no fever or chills. He lost 1 pound since his last visit. He has good appetite but concerned about dysphagia from his previous head and neck cancer. I discussed with referred to gastroenterology for evaluation and consideration of PEG tube placement if needed but the patient declined and mentions that he is feeling okay for now. Pleurx catheter was removed yesterday by Dr. Kerin Ransom. He was supposed to start cycle #2 today but has low platelets count.  On 04/14/2013, The patient has been complaining of increasing fatigue and weakness as well as lack of appetite and dysphagia secondary to prior history of head and neck cancer. He was recently admitted to [facility] for treatment of dehydration and failure to thrive. During his admission he underwent PEG tube placement and he is currently receiving supplemental nutrition through the PEG tube. He is able to maintain his weight well.  The patient is too weak today to resume systemic  chemotherapy. I will keep his treatment on hold for now. I would repeat CT scan of the chest, abdomen and pelvis for restaging of his disease before resuming her systemic therapy and if no improvement the patient may be considered for palliative care and hospice referral.  For the chemotherapy-induced anemia, I will arrange for the patient to receive 2 units of PRBCs transfusion.  On 04/25/2013 The patient came to the clinic today accompanied by his wife for routine followup visit. He is feeling much better after receiving 2 units of PRBCs transfusion. He has more energy. He also eats a little bit better with supplemental nutrition. He continues to have mild fatigue. He denied having any significant chest pain, shortness of breath, cough or hemoptysis. He had repeat CT scan of the chest, abdomen and pelvis and he is here for evaluation and discussion of his scan results.  On 05/19/2013, the patient is here today for evaluation. He tolerated the last cycle of his systemic chemotherapy with carboplatin and Abraxane fairly well except for increasing peripheral neuropathy mainly in the fingers. He has back pain recently and would like to have referral to see Dr. Sharol Given for evaluation. He denied having any significant fever or chills, no nausea or vomiting. He denied having any significant chest pain, shortness breath, cough or hemoptysis.  On 06/30/2013, the patient came to the clinic today accompanied by his wife. He tolerated the last cycle of his systemic chemotherapy fairly  well. He continues to have increasing pain and he described it from the toes to the head. He has been using increasing amount of oxycodone recently. He is currently also on Neurontin 200 mg by mouth 3 times a day. He is scheduled for MRI of the pelvis later this month and he requested that to be done under sedation. He denied having any significant chest pain but continues to have shortness of breath with exertion with no cough or  hemoptysis. He denied having any fever or chills. He had repeat CT scan of the chest, abdomen and pelvis performed recently and he is here for evaluation and discussion of his scan results.  MEDICAL HISTORY: Past Medical History  Diagnosis Date  . Hyperlipidemia   . BPH (benign prostatic hypertrophy)   . Xerostomia   . Colon polyp     HYPERPLASTIC & TUBULAR ADENOMA(Colonoscopy-Dr.[facility] [place])  . Diverticulosis of colon (without mention of hemorrhage)     (Colonoscopy-Dr.[place])  . Internal hemorrhoids without mention of complication     (Colonoscopy-Dr.[place])  . GERD (gastroesophageal reflux disease)     (EGD-Dr. Corinda Gubler)  . Atrophic gastritis without mention of hemorrhage     (EGD-Dr. Corinda Gubler)  . History of radiation therapy   . Pharynx cancer     squamous cell stage 4,s/p XRT,chemo, neck dissection  . Small cell lung cancer   . Non-small cell lung cancer   . Heart murmur   . OSA (obstructive sleep apnea)     does not use cpap dx 2005  . Shortness of breath   . Asthma     borderline  . Pneumonia   . Hypothyroidism   . Headache(784.0)   . Anemia   . Kidney disease     TCC  . Complication of anesthesia     "severe claustrophbia"  . Claustrophobia     "severe"    ALLERGIES:  is allergic to erythromycin and tetracycline.  MEDICATIONS:  Current Outpatient Prescriptions  Medication Sig Dispense Refill  . docusate sodium (COLACE) 100 MG capsule Take 1 capsule (100 mg total) by mouth 2 (two) times daily.  100 capsule  prn  . fentaNYL (DURAGESIC - DOSED MCG/HR) 50 MCG/HR Place 1 patch (50 mcg total) onto the skin every 3 (three) days.  10 patch  0  . finasteride (PROSCAR) 5 MG tablet Take 1 tablet (5 mg total) by mouth at bedtime.  30 tablet  3  . gabapentin (NEURONTIN) 100 MG capsule Take 2 capsules (200 mg total) by mouth 3 (three) times daily.  180 capsule  1  . levothyroxine (SYNTHROID, LEVOTHROID) 88 MCG tablet Take 1 tablet (88 mcg total) by mouth daily before  breakfast.  30 tablet  3  . LORazepam (ATIVAN) 1 MG tablet Take 1 tablet (1 mg total) by mouth every 8 (eight) hours as needed for anxiety.  30 tablet  0  . magnesium citrate SOLN Take 1 Bottle by mouth as needed for severe constipation.      . mirtazapine (REMERON) 30 MG tablet TAKE ONE TABLET AT BEDTIME.  30 tablet  0  . mometasone (NASONEX) 50 MCG/ACT nasal spray Place 2 sprays into the nose daily.      Marland Kitchen NUTRITIONAL SUPPLEMENT LIQD by PEG Tube route. Nutren 2.0.   2- 500 calorie bottles daily      . oxyCODONE (OXY IR/ROXICODONE) 5 MG immediate release tablet Take 1 tablet (5 mg total) by mouth every 4 (four) hours as needed for moderate pain or severe pain.  60  tablet  0  . terazosin (HYTRIN) 1 MG capsule Take 1 mg by mouth 2 (two) times daily.        No current facility-administered medications for this visit.    SURGICAL HISTORY:  Past Surgical History  Procedure Laterality Date  . Appendectomy    . Lumbar laminectomy    . Tonsillectomy    . Bilateral ganglionectomies Left   . Radical right neck dissection  2008  . Pci-rfa renal cell (tcc) cancer  2009    pt denies  . Cholecystectomy  dec. 2009  . Septoplasty  03/01/12    with double turbinectomy  . Video bronchoscopy Bilateral 08/10/2012    Procedure: VIDEO BRONCHOSCOPY WITH FLUORO;  Surgeon: Rigoberto Noel, MD;  Location: [facility];  Service: Cardiopulmonary;  Laterality: Bilateral;  . Shoulder arthroscopy Left 2013  . Thoracentesis Right 10/2012    REVIEW OF SYSTEMS:  Constitutional: negative Eyes: negative Ears, nose, mouth, throat, and face: negative Respiratory: positive for pleurisy/chest pain Cardiovascular: negative Gastrointestinal: positive for dysphagia Genitourinary:negative Integument/breast: negative Hematologic/lymphatic: negative Musculoskeletal:positive for back pain Neurological: negative Behavioral/Psych: positive for anxiety, bad mood, depression, irritability and mood swings Endocrine:  negative Allergic/Immunologic: negative   PHYSICAL EXAMINATION: General appearance: alert, cooperative and no distress Head: Normocephalic, without obvious abnormality, atraumatic Neck: no adenopathy and supple, symmetrical, trachea midline Lymph nodes: Cervical, supraclavicular, and axillary nodes normal. Resp: clear to auscultation bilaterally Back: symmetric, no curvature. ROM normal. No CVA tenderness. Cardio: regular rate and rhythm, S1, S2 normal, no murmur, click, rub or gallop GI: soft, non-tender; bowel sounds normal; no masses,  no organomegaly Extremities: extremities normal, atraumatic, no cyanosis or edema Neurologic: Alert and oriented X 3, normal strength and tone. Normal symmetric reflexes. Normal coordination and gait   ECOG PERFORMANCE STATUS: 2 - Symptomatic, <50% confined to bed  Blood pressure 144/61, pulse 92, temperature 99.5 F (37.5 C), temperature source Oral, resp. rate 18, height 6' (1.829 m), weight 167 lb 14.4 oz (76.159 kg), SpO2 98.00%.  LABORATORY DATA: Lab Results  Component Value Date   WBC 4.2 06/28/2013   HGB 8.9* 06/28/2013   HCT 26.6* 06/28/2013   MCV 95.1 06/28/2013   PLT 115* 06/28/2013      Chemistry      Component Value Date/Time   NA 138 06/28/2013 1536   NA 131* 04/05/2013 0545   K 3.9 06/28/2013 1536   K 3.8 04/05/2013 0545   CL 93* 04/05/2013 0545   CO2 27 06/28/2013 1536   CO2 27 04/05/2013 0545   BUN 14.3 06/28/2013 1536   BUN 8 04/05/2013 0545   CREATININE 0.8 06/28/2013 1536   CREATININE 0.79 04/05/2013 0545      Component Value Date/Time   CALCIUM 8.9 06/28/2013 1536   CALCIUM 8.3* 04/05/2013 0545   ALKPHOS 83 06/28/2013 1536   ALKPHOS 76 05/13/2012 1005   AST 15 06/28/2013 1536   AST 16 05/13/2012 1005   ALT 11 06/28/2013 1536   ALT 11 05/13/2012 1005   BILITOT 0.33 06/28/2013 1536   BILITOT 0.5 05/13/2012 1005       RADIOGRAPHIC STUDIES: Dg Hip Bilateral W/pelvis  06/10/2013   CLINICAL DATA:  History metastatic lung carcinoma. Hip and leg  pain.  EXAM: BILATERAL HIP WITH PELVIS - 4+ VIEW  COMPARISON:  None.  FINDINGS: No fracture.  No bone lesion.  Both hip joints are normally spaced and aligned. There is mild sclerosis along the superior acetabula. Mild bony remodeling is noted along the anterior superior  head neck junctions, a finding which can be seen with femoral acetabular impingement. No other arthropathic change.  Bony pelvis is intact. SI joints and symphysis pubis are normally spaced and aligned. The bones are demineralized. Residual contrast is noted in a sigmoid colon diverticulum. Soft tissues are otherwise unremarkable.  IMPRESSION: 1. No fracture or acute finding. 2. No bone lesion to suggest metastatic disease.   Electronically Signed   By: Lajean Manes M.D.   On: 06/10/2013 14:04   Dg Femur Left  06/10/2013   CLINICAL DATA:  Hip pain and metastatic lung cancer.  EXAM: LEFT FEMUR - 2 VIEW  COMPARISON:  None.  FINDINGS: There is no evidence of fracture or other focal bone lesions. There is mild degenerative osteophytosis around the left hip joint. No significant hip joint narrowing. Radiographic density in the left hemipelvis represents debris within a sigmoid colonic diverticulum.  IMPRESSION: No acute osseous abnormality or evidence of bone metastasis.   Electronically Signed   By: Jorje Guild M.D.   On: 06/10/2013 15:05   Dg Femur Right  06/10/2013   CLINICAL DATA:  Hip and leg pain. History of metastatic lung carcinoma.  EXAM: RIGHT FEMUR - 2 VIEW  COMPARISON:  None.  FINDINGS: No fracture. No bone lesion. Hip and knee joints are normally aligned. Soft tissues are unremarkable.  IMPRESSION: Negative.   Electronically Signed   By: Lajean Manes M.D.   On: 06/10/2013 13:59   Ct Chest W Contrast  06/28/2013   CLINICAL DATA:  Metastatic non-small-cell lung cancer  EXAM: CT CHEST, ABDOMEN, AND PELVIS WITH CONTRAST  TECHNIQUE: Multidetector CT imaging of the chest, abdomen and pelvis was performed following the standard  protocol during bolus administration of intravenous contrast.  CONTRAST:  135mL OMNIPAQUE IOHEXOL 300 MG/ML  SOLN  COMPARISON:  04/21/2013  FINDINGS:   CT CHEST FINDINGS  Postprocedural changes with volume loss in the right hemithorax.  3.8 x 2.1 cm rounded lesion in the right middle lobe is unchanged (series 2/ image 38), favored to reflect rounded atelectasis. Additional rounded atelectasis in the right lower lobe (series 2/ image 46).  Chronic right lung empyema measuring 10.7 x 4.7 cm (series 2/ image 45), previously 11.2 x 4.9 cm, mildly decreased.  5 mm left lower lobe pulmonary nodule (series 4/ image 38), unchanged. No pneumothorax.  The heart is normal in size with rightward mediastinal shift. Mild atherosclerotic calcifications of the aortic arch.  Small mediastinal nodes including an 11 mm short axis precarinal node (series 2/ image 27), previously 10 mm. No suspicious axillary lymphadenopathy.  Left chest port terminating in the mid upper/mid SVC.    CT ABDOMEN AND PELVIS FINDINGS  Gastrostomy tube in satisfactory position.  Multiple hepatic cysts, including a 7.9 x 6.3 cm bilobed cyst in the central right liver (series 2/image 61).  Spleen, pancreas, and adrenal glands are within normal limits.  Status post cholecystectomy. No intrahepatic or extrahepatic ductal dilatation.  Right kidney is within normal limits. Suspected postsurgical changes in the lateral interpolar left kidney (series 2/image 34). No hydronephrosis.  No evidence of bowel obstruction. Colonic diverticulosis, without associated inflammatory changes.  Atherosclerotic calcifications of the abdominal aorta and branch vessels.  No abdominopelvic ascites.  No suspicious abdominopelvic lymphadenopathy.  Prostate is notable for enlargement of the central gland which indents the base of the bladder.  Bladder is within normal limits.  Degenerative changes of the lumbar spine.  No focal osseous lesions.   IMPRESSION: Postprocedural changes  with volume  loss in the right hemithorax.  Small mediastinal nodes measuring up to 11 mm short axis, grossly unchanged.  Suspected rounded atelectasis in the right middle and lower lobes.  Chronic right lung empyema, mildly decreased.  5 mm left lower lobe pulmonary nodule, unchanged.  No evidence of metastatic disease in the abdomen/pelvis.   Electronically Signed   By: Julian Hy M.D.   On: 06/28/2013 17:34   ASSESSMENT AND PLAN: This is is a 79 years old white male who was diagnosed with extensive stage small cell lung cancer status post systemic chemotherapy with carboplatin and etoposide with progressive disease on the right side of the chest. He was seen at [facility] by Dr. Sharlet Salina and was considered for a clinical trial with Ipilumomab and Nivolumab.  Recent repeat biopsy of a right pleural based nodule showed poorly differentiated non-small cell carcinoma. He is currently undergoing systemic chemotherapy with carboplatin and Abraxane status post 5 cycles. He tolerated the chemotherapy fairly well except for increasing fatigue secondary to chemotherapy-induced anemia as well as thrombocytopenia. His recent CT scan of the chest, abdomen and pelvis showed no evidence for disease progression. I discussed the scan results with the patient and his wife and recommended for him to continue his current treatment with carboplatin and Abraxane.  We will proceed with cycle #6 today as scheduled. He would come back for followup visit in 3 weeks for evaluation and management any adverse effect of his treatment. For the pain management, the patient will continue on his current pain medication with fentanyl patch and Percocet. For the peripheral neuropathy, he will continue on reduced dose of Abraxane to 80 mg/M2 and I also increased his Neurontin dose to 300 mg by mouth 3 times a day. He was advised to call immediately if he has any concerning symptoms in the  interval. The patient voices understanding of current disease status and treatment options and is in agreement with the current care plan.  All questions were answered. The patient knows to call the clinic with any problems, questions or concerns. We can certainly see the patient much sooner if necessary. I spent 15 minutes on face-to-face counseling with the patient and his wife today of the total visit time of 25 minutes.  Disclaimer: This note was dictated with voice recognition software. Similar sounding words can inadvertently be transcribed and may not be corrected upon review.  Eilleen Kempf., MD 06/30/2013

## 2013-06-30 NOTE — Telephone Encounter (Signed)
gv adn printed apt sched and avs fo rpt for June and July...sed added tx.

## 2013-07-05 ENCOUNTER — Telehealth: Payer: Self-pay | Admitting: *Deleted

## 2013-07-05 NOTE — Telephone Encounter (Signed)
Pt's wife called left a message stating that pt is requesting a magnesium and vit d level to be drawn.  Per dr Vista Mink, this can be discussed at his next f/u appt.  Called and left a msg on Samuel Hurley's vm.  SLJ

## 2013-07-06 ENCOUNTER — Other Ambulatory Visit: Payer: Self-pay | Admitting: Medical Oncology

## 2013-07-06 DIAGNOSIS — C3490 Malignant neoplasm of unspecified part of unspecified bronchus or lung: Secondary | ICD-10-CM

## 2013-07-07 ENCOUNTER — Encounter (HOSPITAL_COMMUNITY): Payer: Self-pay | Admitting: *Deleted

## 2013-07-07 ENCOUNTER — Ambulatory Visit: Payer: Commercial Managed Care - HMO

## 2013-07-07 ENCOUNTER — Encounter (HOSPITAL_COMMUNITY): Payer: Medicare HMO | Admitting: Vascular Surgery

## 2013-07-07 ENCOUNTER — Other Ambulatory Visit: Payer: Commercial Managed Care - HMO

## 2013-07-07 ENCOUNTER — Ambulatory Visit (HOSPITAL_BASED_OUTPATIENT_CLINIC_OR_DEPARTMENT_OTHER): Payer: Commercial Managed Care - HMO

## 2013-07-07 ENCOUNTER — Ambulatory Visit (HOSPITAL_COMMUNITY): Payer: Medicare HMO | Admitting: Certified Registered"

## 2013-07-07 ENCOUNTER — Ambulatory Visit (HOSPITAL_COMMUNITY)
Admission: RE | Admit: 2013-07-07 | Discharge: 2013-07-07 | Disposition: A | Discharge status: Home / Self Care | Payer: Medicare HMO | Source: Ambulatory Visit | Attending: Internal Medicine | Admitting: Internal Medicine

## 2013-07-07 ENCOUNTER — Encounter (HOSPITAL_COMMUNITY): Payer: Self-pay | Admitting: Certified Registered"

## 2013-07-07 ENCOUNTER — Encounter (HOSPITAL_COMMUNITY)
Admission: RE | Disposition: A | Discharge status: Home / Self Care | Payer: Self-pay | Source: Ambulatory Visit | Attending: Internal Medicine

## 2013-07-07 ENCOUNTER — Other Ambulatory Visit (HOSPITAL_BASED_OUTPATIENT_CLINIC_OR_DEPARTMENT_OTHER): Payer: Commercial Managed Care - HMO

## 2013-07-07 VITALS — BP 136/58 | HR 78 | Temp 99.3°F | Resp 18

## 2013-07-07 DIAGNOSIS — C342 Malignant neoplasm of middle lobe, bronchus or lung: Secondary | ICD-10-CM

## 2013-07-07 DIAGNOSIS — C3491 Malignant neoplasm of unspecified part of right bronchus or lung: Secondary | ICD-10-CM

## 2013-07-07 DIAGNOSIS — E039 Hypothyroidism, unspecified: Secondary | ICD-10-CM | POA: Insufficient documentation

## 2013-07-07 DIAGNOSIS — J45909 Unspecified asthma, uncomplicated: Secondary | ICD-10-CM | POA: Diagnosis not present

## 2013-07-07 DIAGNOSIS — Z5111 Encounter for antineoplastic chemotherapy: Secondary | ICD-10-CM

## 2013-07-07 DIAGNOSIS — D649 Anemia, unspecified: Secondary | ICD-10-CM | POA: Insufficient documentation

## 2013-07-07 DIAGNOSIS — Z85118 Personal history of other malignant neoplasm of bronchus and lung: Secondary | ICD-10-CM | POA: Insufficient documentation

## 2013-07-07 DIAGNOSIS — N289 Disorder of kidney and ureter, unspecified: Secondary | ICD-10-CM | POA: Insufficient documentation

## 2013-07-07 DIAGNOSIS — R51 Headache: Secondary | ICD-10-CM | POA: Insufficient documentation

## 2013-07-07 DIAGNOSIS — M47817 Spondylosis without myelopathy or radiculopathy, lumbosacral region: Secondary | ICD-10-CM | POA: Insufficient documentation

## 2013-07-07 DIAGNOSIS — K219 Gastro-esophageal reflux disease without esophagitis: Secondary | ICD-10-CM | POA: Diagnosis not present

## 2013-07-07 DIAGNOSIS — R109 Unspecified abdominal pain: Secondary | ICD-10-CM | POA: Diagnosis not present

## 2013-07-07 DIAGNOSIS — C3490 Malignant neoplasm of unspecified part of unspecified bronchus or lung: Secondary | ICD-10-CM

## 2013-07-07 DIAGNOSIS — G4730 Sleep apnea, unspecified: Secondary | ICD-10-CM | POA: Insufficient documentation

## 2013-07-07 DIAGNOSIS — R011 Cardiac murmur, unspecified: Secondary | ICD-10-CM | POA: Insufficient documentation

## 2013-07-07 DIAGNOSIS — C384 Malignant neoplasm of pleura: Secondary | ICD-10-CM

## 2013-07-07 DIAGNOSIS — Z95828 Presence of other vascular implants and grafts: Secondary | ICD-10-CM

## 2013-07-07 HISTORY — PX: RADIOLOGY WITH ANESTHESIA: SHX6223

## 2013-07-07 LAB — CBC WITH DIFFERENTIAL/PLATELET
BASO%: 1 % (ref 0.0–2.0)
Basophils Absolute: 0 10*3/uL (ref 0.0–0.1)
EOS ABS: 0 10*3/uL (ref 0.0–0.5)
EOS%: 1 % (ref 0.0–7.0)
HCT: 27.3 % — ABNORMAL LOW (ref 38.4–49.9)
HEMOGLOBIN: 9 g/dL — AB (ref 13.0–17.1)
LYMPH%: 17.8 % (ref 14.0–49.0)
MCH: 32.2 pg (ref 27.2–33.4)
MCHC: 32.8 g/dL (ref 32.0–36.0)
MCV: 98.2 fL — AB (ref 79.3–98.0)
MONO#: 0.6 10*3/uL (ref 0.1–0.9)
MONO%: 13.7 % (ref 0.0–14.0)
NEUT#: 3.1 10*3/uL (ref 1.5–6.5)
NEUT%: 66.5 % (ref 39.0–75.0)
Platelets: 126 10*3/uL — ABNORMAL LOW (ref 140–400)
RBC: 2.78 10*6/uL — ABNORMAL LOW (ref 4.20–5.82)
RDW: 19.2 % — AB (ref 11.0–14.6)
WBC: 4.6 10*3/uL (ref 4.0–10.3)
lymph#: 0.8 10*3/uL — ABNORMAL LOW (ref 0.9–3.3)

## 2013-07-07 LAB — COMPREHENSIVE METABOLIC PANEL (CC13)
ALT: 11 U/L (ref 0–55)
ANION GAP: 7 meq/L (ref 3–11)
AST: 14 U/L (ref 5–34)
Albumin: 2.7 g/dL — ABNORMAL LOW (ref 3.5–5.0)
Alkaline Phosphatase: 81 U/L (ref 40–150)
BUN: 20.6 mg/dL (ref 7.0–26.0)
CO2: 26 meq/L (ref 22–29)
Calcium: 9.1 mg/dL (ref 8.4–10.4)
Chloride: 109 mEq/L (ref 98–109)
Creatinine: 0.8 mg/dL (ref 0.7–1.3)
Glucose: 100 mg/dl (ref 70–140)
Potassium: 3.7 mEq/L (ref 3.5–5.1)
SODIUM: 143 meq/L (ref 136–145)
TOTAL PROTEIN: 6.5 g/dL (ref 6.4–8.3)
Total Bilirubin: 0.25 mg/dL (ref 0.20–1.20)

## 2013-07-07 LAB — MAGNESIUM (CC13): MAGNESIUM: 1.8 mg/dL (ref 1.5–2.5)

## 2013-07-07 SURGERY — RADIOLOGY WITH ANESTHESIA
Anesthesia: Monitor Anesthesia Care

## 2013-07-07 MED ORDER — SODIUM CHLORIDE 0.9 % IJ SOLN
10.0000 mL | INTRAMUSCULAR | Status: DC | PRN
  Administered 2013-07-07: 10 mL
  Filled 2013-07-07: qty 10

## 2013-07-07 MED ORDER — LACTATED RINGERS IV SOLN
INTRAVENOUS | Status: DC | PRN
  Administered 2013-07-07: 08:00:00 via INTRAVENOUS

## 2013-07-07 MED ORDER — SODIUM CHLORIDE 0.9 % IV SOLN
Freq: Once | INTRAVENOUS | Status: AC
  Administered 2013-07-07: 14:00:00 via INTRAVENOUS

## 2013-07-07 MED ORDER — ONDANSETRON 8 MG/50ML IVPB (CHCC)
8.0000 mg | Freq: Once | INTRAVENOUS | Status: AC
  Administered 2013-07-07: 8 mg via INTRAVENOUS

## 2013-07-07 MED ORDER — PROPOFOL INFUSION 10 MG/ML OPTIME
INTRAVENOUS | Status: DC | PRN
  Administered 2013-07-07: 40 ug/kg/min via INTRAVENOUS

## 2013-07-07 MED ORDER — SODIUM CHLORIDE 0.9 % IJ SOLN
10.0000 mL | INTRAMUSCULAR | Status: DC | PRN
  Administered 2013-07-07: 10 mL via INTRAVENOUS
  Filled 2013-07-07: qty 10

## 2013-07-07 MED ORDER — FENTANYL CITRATE 0.05 MG/ML IJ SOLN
INTRAMUSCULAR | Status: DC | PRN
  Administered 2013-07-07: 50 ug via INTRAVENOUS

## 2013-07-07 MED ORDER — DEXAMETHASONE SODIUM PHOSPHATE 10 MG/ML IJ SOLN
INTRAMUSCULAR | Status: AC
  Filled 2013-07-07: qty 1

## 2013-07-07 MED ORDER — GADOBENATE DIMEGLUMINE 529 MG/ML IV SOLN
15.0000 mL | Freq: Once | INTRAVENOUS | Status: AC | PRN
  Administered 2013-07-07: 15 mL via INTRAVENOUS

## 2013-07-07 MED ORDER — PACLITAXEL PROTEIN-BOUND CHEMO INJECTION 100 MG
80.0000 mg/m2 | Freq: Once | INTRAVENOUS | Status: AC
  Administered 2013-07-07: 150 mg via INTRAVENOUS
  Filled 2013-07-07: qty 30

## 2013-07-07 MED ORDER — ONDANSETRON 8 MG/NS 50 ML IVPB
INTRAVENOUS | Status: AC
  Filled 2013-07-07: qty 8

## 2013-07-07 MED ORDER — DEXAMETHASONE SODIUM PHOSPHATE 10 MG/ML IJ SOLN
10.0000 mg | Freq: Once | INTRAMUSCULAR | Status: AC
  Administered 2013-07-07: 10 mg via INTRAVENOUS

## 2013-07-07 MED ORDER — MIDAZOLAM HCL 5 MG/5ML IJ SOLN
INTRAMUSCULAR | Status: DC | PRN
  Administered 2013-07-07 (×2): 1 mg via INTRAVENOUS

## 2013-07-07 MED ORDER — HEPARIN SOD (PORK) LOCK FLUSH 100 UNIT/ML IV SOLN
500.0000 [IU] | Freq: Once | INTRAVENOUS | Status: AC | PRN
  Administered 2013-07-07: 500 [IU]
  Filled 2013-07-07: qty 5

## 2013-07-07 MED ORDER — LACTATED RINGERS IV SOLN: INTRAVENOUS | Status: DC | PRN

## 2013-07-07 NOTE — Patient Instructions (Signed)
[facility] Discharge Instructions for Patients Receiving Chemotherapy  Today you received the following chemotherapy agents Abraxane.   To help prevent nausea and vomiting after your treatment, we encourage you to take your nausea medication as directed.    If you develop nausea and vomiting that is not controlled by your nausea medication, call the clinic.   BELOW ARE SYMPTOMS THAT SHOULD BE REPORTED IMMEDIATELY:  *FEVER GREATER THAN 100.5 F  *CHILLS WITH OR WITHOUT FEVER  NAUSEA AND VOMITING THAT IS NOT CONTROLLED WITH YOUR NAUSEA MEDICATION  *UNUSUAL SHORTNESS OF BREATH  *UNUSUAL BRUISING OR BLEEDING  TENDERNESS IN MOUTH AND THROAT WITH OR WITHOUT PRESENCE OF ULCERS  *URINARY PROBLEMS  *BOWEL PROBLEMS  UNUSUAL RASH Items with * indicate a potential emergency and should be followed up as soon as possible.  Feel free to call the clinic you have any questions or concerns. The clinic phone number is [phone].

## 2013-07-07 NOTE — Anesthesia Preprocedure Evaluation (Signed)
Anesthesia Evaluation  Patient identified by MRN, date of birth, ID band Patient awake    Reviewed: Allergy & Precautions, H&P , NPO status , Patient's Chart, lab work & pertinent test results, reviewed documented beta blocker date and time   History of Anesthesia Complications (+) history of anesthetic complications  Airway Mallampati: II TM Distance: >3 FB Neck ROM: full    Dental   Pulmonary shortness of breath and with exertion, asthma , sleep apnea , pneumonia -, resolved,  breath sounds clear to auscultation        Cardiovascular + Valvular Problems/Murmurs Rhythm:regular     Neuro/Psych  Headaches, negative psych ROS   GI/Hepatic Neg liver ROS, GERD-  Medicated and Controlled,  Endo/Other  Hypothyroidism   Renal/GU Renal disease  negative genitourinary   Musculoskeletal   Abdominal   Peds  Hematology  (+) anemia ,   Anesthesia Other Findings See surgeon's H&P   Reproductive/Obstetrics negative OB ROS                           Anesthesia Physical Anesthesia Plan  ASA: III  Anesthesia Plan: MAC   Post-op Pain Management:    Induction: Intravenous  Airway Management Planned: Simple Face Mask  Additional Equipment:   Intra-op Plan:   Post-operative Plan:   Informed Consent: I have reviewed the patients History and Physical, chart, labs and discussed the procedure including the risks, benefits and alternatives for the proposed anesthesia with the patient or authorized representative who has indicated his/her understanding and acceptance.   Dental Advisory Given  Plan Discussed with: CRNA and Surgeon  Anesthesia Plan Comments:         Anesthesia Quick Evaluation

## 2013-07-07 NOTE — Anesthesia Postprocedure Evaluation (Signed)
Anesthesia Post Note  Patient: Samuel Hurley  Procedure(s) Performed: Procedure(s) (LRB): MRI OF PELVIC (N/A)  Anesthesia type: MAC  Patient location: PACU  Post pain: Pain level controlled  Post assessment: Patient's Cardiovascular Status Stable  Last Vitals:  Filed Vitals:   07/07/13 0915  BP: 111/52  Pulse: 63  Temp: 36.7 C  Resp: 13    Post vital signs: Reviewed and stable  Level of consciousness: alert  Complications: No apparent anesthesia complications

## 2013-07-07 NOTE — Transfer of Care (Signed)
Immediate Anesthesia Transfer of Care Note  Patient: Samuel Hurley  Procedure(s) Performed: Procedure(s) with comments: MRI OF PELVIC (N/A) - DR. MOHAMED/MRI  Patient Location: PACU  Anesthesia Type:MAC  Level of Consciousness: awake  Airway & Oxygen Therapy: Patient Spontanous Breathing and Patient connected to nasal cannula oxygen  Post-op Assessment: Report given to PACU RN, Post -op Vital signs reviewed and stable and Patient moving all extremities  Post vital signs: Reviewed and stable  Complications: No apparent anesthesia complications

## 2013-07-07 NOTE — Discharge Instructions (Signed)

## 2013-07-07 NOTE — Patient Instructions (Signed)

## 2013-07-08 LAB — VITAMIN D 25 HYDROXY (VIT D DEFICIENCY, FRACTURES): Vit D, 25-Hydroxy: 28 ng/mL — ABNORMAL LOW (ref 30–89)

## 2013-07-10 ENCOUNTER — Encounter (HOSPITAL_COMMUNITY): Payer: Self-pay | Admitting: Radiology

## 2013-07-14 ENCOUNTER — Other Ambulatory Visit (HOSPITAL_BASED_OUTPATIENT_CLINIC_OR_DEPARTMENT_OTHER): Payer: Commercial Managed Care - HMO

## 2013-07-14 ENCOUNTER — Ambulatory Visit: Payer: Commercial Managed Care - HMO

## 2013-07-14 ENCOUNTER — Other Ambulatory Visit: Payer: Self-pay | Admitting: *Deleted

## 2013-07-14 ENCOUNTER — Ambulatory Visit (HOSPITAL_BASED_OUTPATIENT_CLINIC_OR_DEPARTMENT_OTHER): Payer: Commercial Managed Care - HMO

## 2013-07-14 ENCOUNTER — Other Ambulatory Visit: Payer: Commercial Managed Care - HMO

## 2013-07-14 ENCOUNTER — Ambulatory Visit (HOSPITAL_COMMUNITY)
Admission: RE | Admit: 2013-07-14 | Discharge: 2013-07-14 | Disposition: A | Discharge status: Home / Self Care | Payer: Medicare HMO | Source: Ambulatory Visit | Attending: Internal Medicine | Admitting: Internal Medicine

## 2013-07-14 VITALS — BP 125/67 | HR 73 | Temp 97.9°F | Resp 18

## 2013-07-14 DIAGNOSIS — Z5111 Encounter for antineoplastic chemotherapy: Secondary | ICD-10-CM

## 2013-07-14 DIAGNOSIS — Z95828 Presence of other vascular implants and grafts: Secondary | ICD-10-CM

## 2013-07-14 DIAGNOSIS — C3490 Malignant neoplasm of unspecified part of unspecified bronchus or lung: Secondary | ICD-10-CM

## 2013-07-14 DIAGNOSIS — M549 Dorsalgia, unspecified: Secondary | ICD-10-CM

## 2013-07-14 DIAGNOSIS — D6481 Anemia due to antineoplastic chemotherapy: Secondary | ICD-10-CM

## 2013-07-14 DIAGNOSIS — T451X5A Adverse effect of antineoplastic and immunosuppressive drugs, initial encounter: Secondary | ICD-10-CM | POA: Insufficient documentation

## 2013-07-14 DIAGNOSIS — C342 Malignant neoplasm of middle lobe, bronchus or lung: Secondary | ICD-10-CM

## 2013-07-14 DIAGNOSIS — C3491 Malignant neoplasm of unspecified part of right bronchus or lung: Secondary | ICD-10-CM

## 2013-07-14 LAB — CBC WITH DIFFERENTIAL/PLATELET
BASO%: 0.4 % (ref 0.0–2.0)
Basophils Absolute: 0 10*3/uL (ref 0.0–0.1)
EOS%: 1.4 % (ref 0.0–7.0)
Eosinophils Absolute: 0.1 10*3/uL (ref 0.0–0.5)
HCT: 23.4 % — ABNORMAL LOW (ref 38.4–49.9)
HGB: 7.5 g/dL — ABNORMAL LOW (ref 13.0–17.1)
LYMPH#: 0.7 10*3/uL — AB (ref 0.9–3.3)
LYMPH%: 13.5 % — AB (ref 14.0–49.0)
MCH: 31.5 pg (ref 27.2–33.4)
MCHC: 32.1 g/dL (ref 32.0–36.0)
MCV: 98.3 fL — AB (ref 79.3–98.0)
MONO#: 0.6 10*3/uL (ref 0.1–0.9)
MONO%: 13.1 % (ref 0.0–14.0)
NEUT#: 3.5 10*3/uL (ref 1.5–6.5)
NEUT%: 71.6 % (ref 39.0–75.0)
PLATELETS: 110 10*3/uL — AB (ref 140–400)
RBC: 2.38 10*6/uL — ABNORMAL LOW (ref 4.20–5.82)
RDW: 18.4 % — ABNORMAL HIGH (ref 11.0–14.6)
WBC: 4.9 10*3/uL (ref 4.0–10.3)
nRBC: 0 % (ref 0–0)

## 2013-07-14 LAB — COMPREHENSIVE METABOLIC PANEL (CC13)
ALBUMIN: 2.6 g/dL — AB (ref 3.5–5.0)
ALT: 10 U/L (ref 0–55)
ANION GAP: 9 meq/L (ref 3–11)
AST: 14 U/L (ref 5–34)
Alkaline Phosphatase: 72 U/L (ref 40–150)
BUN: 31.6 mg/dL — ABNORMAL HIGH (ref 7.0–26.0)
CHLORIDE: 107 meq/L (ref 98–109)
CO2: 26 mEq/L (ref 22–29)
Calcium: 8.8 mg/dL (ref 8.4–10.4)
Creatinine: 1 mg/dL (ref 0.7–1.3)
GLUCOSE: 119 mg/dL (ref 70–140)
Potassium: 3.8 mEq/L (ref 3.5–5.1)
SODIUM: 142 meq/L (ref 136–145)
Total Protein: 6.2 g/dL — ABNORMAL LOW (ref 6.4–8.3)

## 2013-07-14 LAB — PREPARE RBC (CROSSMATCH)

## 2013-07-14 LAB — HOLD TUBE, BLOOD BANK

## 2013-07-14 MED ORDER — ONDANSETRON 8 MG/NS 50 ML IVPB
INTRAVENOUS | Status: AC
  Filled 2013-07-14: qty 8

## 2013-07-14 MED ORDER — HEPARIN SOD (PORK) LOCK FLUSH 100 UNIT/ML IV SOLN
500.0000 [IU] | Freq: Once | INTRAVENOUS | Status: AC | PRN
  Administered 2013-07-14: 500 [IU]
  Filled 2013-07-14: qty 5

## 2013-07-14 MED ORDER — MIRTAZAPINE 30 MG PO TABS: 30.0000 mg | ORAL_TABLET | Freq: Every day | ORAL | Status: DC

## 2013-07-14 MED ORDER — ONDANSETRON 8 MG/50ML IVPB (CHCC)
8.0000 mg | Freq: Once | INTRAVENOUS | Status: AC
  Administered 2013-07-14: 8 mg via INTRAVENOUS

## 2013-07-14 MED ORDER — PACLITAXEL PROTEIN-BOUND CHEMO INJECTION 100 MG
80.0000 mg/m2 | Freq: Once | INTRAVENOUS | Status: AC
  Administered 2013-07-14: 150 mg via INTRAVENOUS
  Filled 2013-07-14: qty 30

## 2013-07-14 MED ORDER — SODIUM CHLORIDE 0.9 % IJ SOLN
10.0000 mL | INTRAMUSCULAR | Status: DC | PRN
  Administered 2013-07-14: 10 mL via INTRAVENOUS
  Filled 2013-07-14: qty 10

## 2013-07-14 MED ORDER — DEXAMETHASONE SODIUM PHOSPHATE 10 MG/ML IJ SOLN
INTRAMUSCULAR | Status: AC
  Filled 2013-07-14: qty 1

## 2013-07-14 MED ORDER — GABAPENTIN 100 MG PO CAPS: 200.0000 mg | ORAL_CAPSULE | Freq: Three times a day (TID) | ORAL | Status: DC

## 2013-07-14 MED ORDER — DEXAMETHASONE SODIUM PHOSPHATE 10 MG/ML IJ SOLN
10.0000 mg | Freq: Once | INTRAMUSCULAR | Status: AC
  Administered 2013-07-14: 10 mg via INTRAVENOUS

## 2013-07-14 MED ORDER — SODIUM CHLORIDE 0.9 % IV SOLN
Freq: Once | INTRAVENOUS | Status: AC
  Administered 2013-07-14: 15:00:00 via INTRAVENOUS

## 2013-07-14 MED ORDER — OXYCODONE HCL 5 MG PO TABS: 5.0000 mg | ORAL_TABLET | ORAL | Status: DC | PRN

## 2013-07-14 MED ORDER — SODIUM CHLORIDE 0.9 % IJ SOLN
10.0000 mL | INTRAMUSCULAR | Status: DC | PRN
  Administered 2013-07-14: 10 mL
  Filled 2013-07-14: qty 10

## 2013-07-14 NOTE — Patient Instructions (Signed)
[facility] Discharge Instructions for Patients Receiving Chemotherapy  Today you received the following chemotherapy agents abraxane.    To help prevent nausea and vomiting after your treatment, we encourage you to take your nausea medication as directed.      If you develop nausea and vomiting that is not controlled by your nausea medication, call the clinic.   BELOW ARE SYMPTOMS THAT SHOULD BE REPORTED IMMEDIATELY:  *FEVER GREATER THAN 100.5 F  *CHILLS WITH OR WITHOUT FEVER  NAUSEA AND VOMITING THAT IS NOT CONTROLLED WITH YOUR NAUSEA MEDICATION  *UNUSUAL SHORTNESS OF BREATH  *UNUSUAL BRUISING OR BLEEDING  TENDERNESS IN MOUTH AND THROAT WITH OR WITHOUT PRESENCE OF ULCERS  *URINARY PROBLEMS  *BOWEL PROBLEMS  UNUSUAL RASH Items with * indicate a potential emergency and should be followed up as soon as possible.  Feel free to call the clinic you have any questions or concerns. The clinic phone number is [phone].

## 2013-07-14 NOTE — Progress Notes (Signed)
Ok to treat today per Dr. Julien Nordmann.  Will schedule appointment for blood transfusion either tomorrow 6/26 or Saturday 6/27.  No need to wait on CMET to result for chemo.

## 2013-07-14 NOTE — Patient Instructions (Signed)

## 2013-07-15 ENCOUNTER — Encounter: Payer: Self-pay | Admitting: *Deleted

## 2013-07-15 ENCOUNTER — Ambulatory Visit (HOSPITAL_BASED_OUTPATIENT_CLINIC_OR_DEPARTMENT_OTHER): Payer: Commercial Managed Care - HMO

## 2013-07-15 VITALS — BP 122/54 | HR 65 | Temp 97.8°F | Resp 16

## 2013-07-15 DIAGNOSIS — T451X5A Adverse effect of antineoplastic and immunosuppressive drugs, initial encounter: Secondary | ICD-10-CM

## 2013-07-15 DIAGNOSIS — D6481 Anemia due to antineoplastic chemotherapy: Secondary | ICD-10-CM

## 2013-07-15 MED ORDER — ACETAMINOPHEN 325 MG PO TABS
ORAL_TABLET | ORAL | Status: AC
  Filled 2013-07-15: qty 2

## 2013-07-15 MED ORDER — DIPHENHYDRAMINE HCL 25 MG PO CAPS
25.0000 mg | ORAL_CAPSULE | Freq: Once | ORAL | Status: AC
  Administered 2013-07-15: 25 mg via ORAL

## 2013-07-15 MED ORDER — HEPARIN SOD (PORK) LOCK FLUSH 100 UNIT/ML IV SOLN
500.0000 [IU] | Freq: Every day | INTRAVENOUS | Status: AC | PRN
  Administered 2013-07-15: 500 [IU]
  Filled 2013-07-15: qty 5

## 2013-07-15 MED ORDER — SODIUM CHLORIDE 0.9 % IV SOLN
250.0000 mL | Freq: Once | INTRAVENOUS | Status: AC
  Administered 2013-07-15: 250 mL via INTRAVENOUS

## 2013-07-15 MED ORDER — SODIUM CHLORIDE 0.9 % IJ SOLN
10.0000 mL | INTRAMUSCULAR | Status: AC | PRN
  Administered 2013-07-15: 10 mL
  Filled 2013-07-15: qty 10

## 2013-07-15 MED ORDER — DIPHENHYDRAMINE HCL 25 MG PO CAPS
ORAL_CAPSULE | ORAL | Status: AC
  Filled 2013-07-15: qty 1

## 2013-07-15 MED ORDER — ACETAMINOPHEN 325 MG PO TABS
650.0000 mg | ORAL_TABLET | Freq: Once | ORAL | Status: AC
  Administered 2013-07-15: 650 mg via ORAL

## 2013-07-15 NOTE — Patient Instructions (Signed)

## 2013-07-16 LAB — TYPE AND SCREEN
ABO/RH(D): A POS
ANTIBODY SCREEN: NEGATIVE
UNIT DIVISION: 0
Unit division: 0

## 2013-07-19 ENCOUNTER — Other Ambulatory Visit: Payer: Self-pay | Admitting: *Deleted

## 2013-07-19 DIAGNOSIS — C3490 Malignant neoplasm of unspecified part of unspecified bronchus or lung: Secondary | ICD-10-CM

## 2013-07-19 MED ORDER — LORAZEPAM 1 MG PO TABS: 1.0000 mg | ORAL_TABLET | Freq: Three times a day (TID) | ORAL | Status: DC | PRN

## 2013-07-20 ENCOUNTER — Other Ambulatory Visit: Payer: Self-pay | Admitting: *Deleted

## 2013-07-20 DIAGNOSIS — C3490 Malignant neoplasm of unspecified part of unspecified bronchus or lung: Secondary | ICD-10-CM

## 2013-07-21 ENCOUNTER — Ambulatory Visit (HOSPITAL_BASED_OUTPATIENT_CLINIC_OR_DEPARTMENT_OTHER): Payer: Commercial Managed Care - HMO

## 2013-07-21 ENCOUNTER — Ambulatory Visit (HOSPITAL_BASED_OUTPATIENT_CLINIC_OR_DEPARTMENT_OTHER): Payer: Commercial Managed Care - HMO | Admitting: Physician Assistant

## 2013-07-21 ENCOUNTER — Other Ambulatory Visit (HOSPITAL_BASED_OUTPATIENT_CLINIC_OR_DEPARTMENT_OTHER): Payer: Commercial Managed Care - HMO

## 2013-07-21 ENCOUNTER — Ambulatory Visit: Payer: Commercial Managed Care - HMO

## 2013-07-21 ENCOUNTER — Encounter: Payer: Self-pay | Admitting: Physician Assistant

## 2013-07-21 VITALS — BP 126/53 | HR 71 | Temp 98.3°F | Resp 19 | Ht 72.0 in | Wt 177.6 lb

## 2013-07-21 DIAGNOSIS — Z95828 Presence of other vascular implants and grafts: Secondary | ICD-10-CM

## 2013-07-21 DIAGNOSIS — C3490 Malignant neoplasm of unspecified part of unspecified bronchus or lung: Secondary | ICD-10-CM

## 2013-07-21 DIAGNOSIS — C3491 Malignant neoplasm of unspecified part of right bronchus or lung: Secondary | ICD-10-CM

## 2013-07-21 DIAGNOSIS — Z5111 Encounter for antineoplastic chemotherapy: Secondary | ICD-10-CM

## 2013-07-21 DIAGNOSIS — D6481 Anemia due to antineoplastic chemotherapy: Secondary | ICD-10-CM

## 2013-07-21 DIAGNOSIS — T451X5A Adverse effect of antineoplastic and immunosuppressive drugs, initial encounter: Secondary | ICD-10-CM

## 2013-07-21 DIAGNOSIS — G609 Hereditary and idiopathic neuropathy, unspecified: Secondary | ICD-10-CM

## 2013-07-21 LAB — COMPREHENSIVE METABOLIC PANEL (CC13)
ALT: 12 U/L (ref 0–55)
ANION GAP: 7 meq/L (ref 3–11)
AST: 17 U/L (ref 5–34)
Albumin: 2.7 g/dL — ABNORMAL LOW (ref 3.5–5.0)
Alkaline Phosphatase: 70 U/L (ref 40–150)
BILIRUBIN TOTAL: 0.21 mg/dL (ref 0.20–1.20)
BUN: 29.5 mg/dL — AB (ref 7.0–26.0)
CO2: 29 meq/L (ref 22–29)
CREATININE: 0.9 mg/dL (ref 0.7–1.3)
Calcium: 8.7 mg/dL (ref 8.4–10.4)
Chloride: 106 mEq/L (ref 98–109)
GLUCOSE: 96 mg/dL (ref 70–140)
Potassium: 4.5 mEq/L (ref 3.5–5.1)
Sodium: 142 mEq/L (ref 136–145)
Total Protein: 6.2 g/dL — ABNORMAL LOW (ref 6.4–8.3)

## 2013-07-21 LAB — CBC WITH DIFFERENTIAL/PLATELET
BASO%: 0.6 % (ref 0.0–2.0)
Basophils Absolute: 0 10*3/uL (ref 0.0–0.1)
EOS ABS: 0.1 10*3/uL (ref 0.0–0.5)
EOS%: 1.3 % (ref 0.0–7.0)
HCT: 28.2 % — ABNORMAL LOW (ref 38.4–49.9)
HGB: 9.2 g/dL — ABNORMAL LOW (ref 13.0–17.1)
LYMPH%: 13.6 % — AB (ref 14.0–49.0)
MCH: 32.2 pg (ref 27.2–33.4)
MCHC: 32.6 g/dL (ref 32.0–36.0)
MCV: 98.7 fL — AB (ref 79.3–98.0)
MONO#: 0.6 10*3/uL (ref 0.1–0.9)
MONO%: 14.9 % — ABNORMAL HIGH (ref 0.0–14.0)
NEUT%: 69.6 % (ref 39.0–75.0)
NEUTROS ABS: 2.9 10*3/uL (ref 1.5–6.5)
PLATELETS: 150 10*3/uL (ref 140–400)
RBC: 2.86 10*6/uL — AB (ref 4.20–5.82)
RDW: 19.6 % — ABNORMAL HIGH (ref 11.0–14.6)
WBC: 4.1 10*3/uL (ref 4.0–10.3)
lymph#: 0.6 10*3/uL — ABNORMAL LOW (ref 0.9–3.3)

## 2013-07-21 MED ORDER — DEXAMETHASONE SODIUM PHOSPHATE 20 MG/5ML IJ SOLN
INTRAMUSCULAR | Status: AC
  Filled 2013-07-21: qty 5

## 2013-07-21 MED ORDER — CARBOPLATIN CHEMO INTRADERMAL TEST DOSE 100MCG/0.02ML
100.0000 ug | Freq: Once | INTRADERMAL | Status: AC
  Administered 2013-07-21: 100 ug via INTRADERMAL
  Filled 2013-07-21: qty 0.01

## 2013-07-21 MED ORDER — HEPARIN SOD (PORK) LOCK FLUSH 100 UNIT/ML IV SOLN
500.0000 [IU] | Freq: Once | INTRAVENOUS | Status: AC | PRN
  Administered 2013-07-21: 500 [IU]
  Filled 2013-07-21: qty 5

## 2013-07-21 MED ORDER — PACLITAXEL PROTEIN-BOUND CHEMO INJECTION 100 MG
80.0000 mg/m2 | Freq: Once | INTRAVENOUS | Status: AC
  Administered 2013-07-21: 150 mg via INTRAVENOUS
  Filled 2013-07-21: qty 30

## 2013-07-21 MED ORDER — ONDANSETRON 16 MG/50ML IVPB (CHCC)
INTRAVENOUS | Status: AC
  Filled 2013-07-21: qty 16

## 2013-07-21 MED ORDER — SODIUM CHLORIDE 0.9 % IJ SOLN
10.0000 mL | INTRAMUSCULAR | Status: DC | PRN
  Administered 2013-07-21: 10 mL
  Filled 2013-07-21: qty 10

## 2013-07-21 MED ORDER — ONDANSETRON 16 MG/50ML IVPB (CHCC)
16.0000 mg | Freq: Once | INTRAVENOUS | Status: AC
  Administered 2013-07-21: 16 mg via INTRAVENOUS

## 2013-07-21 MED ORDER — SODIUM CHLORIDE 0.9 % IV SOLN
420.0000 mg | Freq: Once | INTRAVENOUS | Status: AC
  Administered 2013-07-21: 420 mg via INTRAVENOUS
  Filled 2013-07-21: qty 42

## 2013-07-21 MED ORDER — SODIUM CHLORIDE 0.9 % IV SOLN
Freq: Once | INTRAVENOUS | Status: AC
  Administered 2013-07-21: 15:00:00 via INTRAVENOUS

## 2013-07-21 MED ORDER — DEXAMETHASONE SODIUM PHOSPHATE 20 MG/5ML IJ SOLN
20.0000 mg | Freq: Once | INTRAMUSCULAR | Status: AC
  Administered 2013-07-21: 20 mg via INTRAVENOUS

## 2013-07-21 MED ORDER — SODIUM CHLORIDE 0.9 % IJ SOLN
10.0000 mL | INTRAMUSCULAR | Status: DC | PRN
  Administered 2013-07-21: 10 mL via INTRAVENOUS
  Filled 2013-07-21: qty 10

## 2013-07-21 NOTE — Patient Instructions (Addendum)
[facility] Discharge Instructions for Patients Receiving Chemotherapy  Today you received the following chemotherapy agents Abraxane/Carboplatin  To help prevent nausea and vomiting after your treatment, we encourage you to take your nausea medication as needed.   If you develop nausea and vomiting that is not controlled by your nausea medication, call the clinic.   BELOW ARE SYMPTOMS THAT SHOULD BE REPORTED IMMEDIATELY:  *FEVER GREATER THAN 100.5 F  *CHILLS WITH OR WITHOUT FEVER  NAUSEA AND VOMITING THAT IS NOT CONTROLLED WITH YOUR NAUSEA MEDICATION  *UNUSUAL SHORTNESS OF BREATH  *UNUSUAL BRUISING OR BLEEDING  TENDERNESS IN MOUTH AND THROAT WITH OR WITHOUT PRESENCE OF ULCERS  *URINARY PROBLEMS  *BOWEL PROBLEMS  UNUSUAL RASH Items with * indicate a potential emergency and should be followed up as soon as possible.  Feel free to call the clinic should you have any questions or concerns. The clinic phone number is [phone].

## 2013-07-21 NOTE — Progress Notes (Signed)
[facility] [place] [phone]   [phone]) [phone]  OFFICE VISIT PROGRESS NOTE  Samuel Rio, PA-C [address] [place] [address]  DIAGNOSIS AND STAGE:  1) Poorly differentiated non-small cell lung cancer diagnosed in December of 2014 2) Extensive stage small cell lung cancer diagnosed in July 2014.  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 4)  History of oncocytic renal tumor status post radiofrequency ablation at [facility] in August of 2008.  PRIOR THERAPY: Systemic chemotherapy with carboplatin for AUC of 5 on day 1 and etoposide 120 mg/M2 on days 1, 2 and 3 with Neulasta support on day 4, status post 4 cycles. First dose was given on 08/18/2012.    CURRENT THERAPY: Systemic chemotherapy with carboplatin for AUC of 5 on day 1 and Abraxane 80 mg/M2 on days 1, 8 and 15 every 3 weeks, status post 6 cycles.  INTERVAL HISTORY: Samuel Hurley 79 y.o. male returns to the clinic today for followup visit.   On 01/29/2013, The patient is feeling much better today with no specific complaints except for the occasional right-sided chest pain and this is well-controlled with his current pain medication with fentanyl patch 50 mcg/hour every 3 days in addition to Percocet on as-needed basis. He takes around 2-3 Percocet on daily basis for breakthrough pain. His shortness of breath is much improved. The patient continues to have drainage from the Pleuryx catheter but this is down to less than 50 cc every 3 days and he is expected to have his Pleurx catheter removed and few weeks by Dr. Kerin Ransom at [facility]. He recently underwent biopsy of one of the right pleural-based nodule at [facility] and the final pathology was  consistent with poorly differentiated non-small cell carcinoma. The tumor cells are of small size with hyperchromatic nuclei and some molded forms. Many of the cells have abundant cytoplasm and nuclei with prominent nucleoli. Strong immunoreactivity is observed for anti-keratins and CK5/6, with negative staining observed for calretinin, WT-1, CK7, desmin, TTF-1 , Fli-1, CD99 and CD56. The entirety of these findings is most consistent with non-small carcinoma, with immunohistochemical evidence of rudimentary squamous differentiation. Bright pankeratin/ CK5/6 immunoreactivity with negative staining for TTF-1 and CD56 is not typical of small cell carcinoma.  He was seen by Dr. Sharlet Salina who sent him to be treated locally for the recently diagnosed non-small cell carcinoma as the patient is not a candidate for the clinical trial with Ipilumomab and Nivolumab for the second line option for the previously diagnosed small cell lung cancer. The patient was also seen at [facility] by Dr. Lewanda Rife who discussed within palliative systemic chemotherapy for his condition. The patient is not accepting the fact that he has an incurable condition and he still shopping around for other treatment options. He also requested a referral to the [facility]. He came today for evaluation and discussion of his treatment options after the recent diagnosis of non-small cell carcinoma.  On 02/21/2013, the patient present today for evaluation of his condition. He was seen by her medical oncologist in the last few weeks including Dr. Harlow Asa at [facility] in [facility] as well as a Statistician at [employer]  in [place]. The visit records from Dr. Harlow Asa are currently available to me and he is in agreement with the current evaluation of Mr. Samuel Hurley regarding dealing with 2 different lung cancer including small cell lung cancer which was treated with  carboplatin and etoposide in addition to recently diagnosed poorly differentiated non-small cell carcinoma. He also concurs with the treatment options provided for Mr. Samuel Hurley including treatment with carboplatin and Abraxane. The patient is here today for evaluation and consideration of starting treatment for his newly diagnosed poorly differentiated non-small cell lung cancer. He is feeling much better today with no specific complaints except for mild fatigue. His Pleurx catheter drains less than 75 cc of pleural fluid every 3 days. The molecular biomarkers performed at [facility] were negative for EGFR mutation and the ALK gene translocation.  On 03/17/2013, the patient is here today for evaluation accompanied by his wife. He tolerated the first cycle of his systemic chemotherapy with carboplatin and Abraxane fairly well with no significant adverse effects. He denied having any significant nausea or vomiting, no fever or chills. He lost 1 pound since his last visit. He has good appetite but concerned about dysphagia from his previous head and neck cancer. I discussed with referred to gastroenterology for evaluation and consideration of PEG tube placement if needed but the patient declined and mentions that he is feeling okay for now. Pleurx catheter was removed yesterday by Dr. Kerin Ransom. He was supposed to start cycle #2 today but has low platelets count.  On 04/14/2013, The patient has been complaining of increasing fatigue and weakness as well as lack of appetite and dysphagia secondary to prior history of head and neck cancer. He was recently admitted to [facility] for treatment of dehydration and failure to thrive. During his admission he underwent PEG tube placement and he is currently receiving supplemental nutrition through the PEG tube. He is able to maintain his weight well.  The patient is too weak today to resume systemic chemotherapy. I will keep his treatment on hold for now. I  would repeat CT scan of the chest, abdomen and pelvis for restaging of his disease before resuming her systemic therapy and if no improvement the patient may be considered for palliative care and hospice referral.  For the chemotherapy-induced anemia, I will arrange for the patient to receive 2 units of PRBCs transfusion.  On 04/25/2013 The patient came to the clinic today accompanied by his wife for routine followup visit. He is feeling much better after receiving 2 units of PRBCs transfusion. He has more energy. He also eats a little bit better with supplemental nutrition. He continues to have mild fatigue. He denied having any significant chest pain, shortness of breath, cough or hemoptysis. He had repeat CT scan of the chest, abdomen and pelvis and he is here for evaluation and discussion of his scan results.  On 05/19/2013, the patient is here today for evaluation. He tolerated the last cycle of his systemic chemotherapy with carboplatin and Abraxane fairly well except for increasing peripheral neuropathy mainly in the fingers. He has back pain recently and would like to have referral to see Dr. Sharol Given for evaluation. He denied having any significant fever or chills, no nausea or vomiting. He denied having any significant chest pain, shortness breath, cough or hemoptysis.  On 07/21/2013, the patient came to the clinic today unaccompanied. Overall is tolerating his chemotherapy relatively well. He had an MRI of his pelvis with and without contrast on 07/07/2013  there revealed no evidence of osseous metastatic disease within the pelvis and no definite soft tissue metastasis. There is lower lumbar spondylosis which appeared grossly normal and there were no acute findings demonstrated. He tolerated the last cycle of his systemic chemotherapy fairly well. He continues to have increasing pain and he described it from the toes to the head. He also reports some constipation requiring occasional use of magnesium  citrate.  He denied having any significant chest pain but continues to have shortness of breath with exertion with no cough or hemoptysis. He denied having any fever or chills.   MEDICAL HISTORY: Past Medical History  Diagnosis Date  . Hyperlipidemia   . BPH (benign prostatic hypertrophy)   . Xerostomia   . Colon polyp     HYPERPLASTIC & TUBULAR ADENOMA(Colonoscopy-Dr.Mason)  . Diverticulosis of colon (without mention of hemorrhage)     (Colonoscopy-Dr.[place])  . Internal hemorrhoids without mention of complication     (Colonoscopy-Dr.[place])  . GERD (gastroesophageal reflux disease)     (EGD-Dr. Velora Heckler)  . Atrophic gastritis without mention of hemorrhage     (EGD-Dr. Velora Heckler)  . History of radiation therapy   . Pharynx cancer     squamous cell stage 4,s/p XRT,chemo, neck dissection  . Small cell lung cancer   . Non-small cell lung cancer   . Heart murmur   . OSA (obstructive sleep apnea)     does not use cpap dx 2005  . Shortness of breath   . Asthma     borderline  . Pneumonia   . Hypothyroidism   . Headache(784.0)   . Anemia   . Kidney disease     TCC  . Complication of anesthesia     "severe claustrophbia"  . Claustrophobia     "severe"    ALLERGIES:  is allergic to erythromycin and tetracycline.  MEDICATIONS:  Current Outpatient Prescriptions  Medication Sig Dispense Refill  . docusate sodium (COLACE) 100 MG capsule Take 1 capsule (100 mg total) by mouth 2 (two) times daily.  100 capsule  prn  . fentaNYL (DURAGESIC - DOSED MCG/HR) 50 MCG/HR Place 1 patch (50 mcg total) onto the skin every 3 (three) days.  10 patch  0  . finasteride (PROSCAR) 5 MG tablet Take 1 tablet (5 mg total) by mouth at bedtime.  30 tablet  3  . gabapentin (NEURONTIN) 100 MG capsule Take 300 mg by mouth 3 (three) times daily. 06/30/13 DICTATION BY DR.MOHAMED INCREASED THE GABAPENTIN DOSE TO 300MG THREE TIMES A DAY. PHARMACY NOTIFIED      . levothyroxine (SYNTHROID, LEVOTHROID) 88 MCG  tablet Take 1 tablet (88 mcg total) by mouth daily before breakfast.  30 tablet  3  . LORazepam (ATIVAN) 1 MG tablet Take 1 tablet (1 mg total) by mouth every 8 (eight) hours as needed for anxiety.  30 tablet  0  . magnesium citrate SOLN Take 1 Bottle by mouth as needed for severe constipation.      . mirtazapine (REMERON) 30 MG tablet Take 1 tablet (30 mg total) by mouth at bedtime.  30 tablet  0  . mometasone (NASONEX) 50 MCG/ACT nasal spray Place 2 sprays into the nose daily.      Marland Kitchen NUTRITIONAL SUPPLEMENT LIQD by PEG Tube route. Nutren 2.0.   2- 500 calorie bottles daily      . terazosin (HYTRIN) 1 MG capsule Take 1 mg by mouth 2 (two) times daily.       Marland Kitchen tiZANidine (ZANAFLEX) 4 MG  capsule Take 4 mg by mouth 2 (two) times daily.      Marland Kitchen oxyCODONE (OXY IR/ROXICODONE) 5 MG immediate release tablet Take 1 tablet (5 mg total) by mouth every 4 (four) hours as needed for moderate pain or severe pain.  60 tablet  0   No current facility-administered medications for this visit.    SURGICAL HISTORY:  Past Surgical History  Procedure Laterality Date  . Appendectomy    . Lumbar laminectomy    . Tonsillectomy    . Bilateral ganglionectomies Left   . Radical right neck dissection  2008  . Pci-rfa renal cell (tcc) cancer  2009    pt denies  . Cholecystectomy  dec. 2009  . Septoplasty  03/01/12    with double turbinectomy  . Video bronchoscopy Bilateral 08/10/2012    Procedure: VIDEO BRONCHOSCOPY WITH FLUORO;  Surgeon: Rigoberto Noel, MD;  Location: [facility];  Service: Cardiopulmonary;  Laterality: Bilateral;  . Shoulder arthroscopy Left 2013  . Thoracentesis Right 10/2012  . Radiology with anesthesia N/A 07/07/2013    Procedure: MRI OF PELVIC;  Surgeon: Medication Radiologist, MD;  Location: [facility];  Service: Radiology;  Laterality: N/A;  DR. MOHAMED/MRI    REVIEW OF SYSTEMS:  Constitutional: negative Eyes: negative Ears, nose, mouth, throat, and face: negative Respiratory: positive for  pleurisy/chest pain Cardiovascular: negative Gastrointestinal: positive for dysphagia Genitourinary:negative Integument/breast: negative Hematologic/lymphatic: negative Musculoskeletal:positive for back pain Neurological: negative Behavioral/Psych: positive for anxiety, bad mood, depression, irritability and mood swings Endocrine: negative Allergic/Immunologic: negative   PHYSICAL EXAMINATION: General appearance: alert, cooperative and no distress Head: Normocephalic, without obvious abnormality, atraumatic Neck: no adenopathy and supple, symmetrical, trachea midline Lymph nodes: Cervical, supraclavicular, and axillary nodes normal. Resp: clear to auscultation bilaterally Back: symmetric, no curvature. ROM normal. No CVA tenderness. Cardio: regular rate and rhythm, S1, S2 normal, no murmur, click, rub or gallop GI: soft, non-tender; bowel sounds normal; no masses,  no organomegaly Extremities: extremities normal, atraumatic, no cyanosis or edema Neurologic: Alert and oriented X 3, normal strength and tone. Normal symmetric reflexes. Normal coordination and gait   ECOG PERFORMANCE STATUS: 2 - Symptomatic, <50% confined to bed  Blood pressure 126/53, pulse 71, temperature 98.3 F (36.8 C), temperature source Oral, resp. rate 19, height 6' (1.829 m), weight 177 lb 9.6 oz (80.559 kg).  LABORATORY DATA: Lab Results  Component Value Date   WBC 4.1 07/21/2013   HGB 9.2* 07/21/2013   HCT 28.2* 07/21/2013   MCV 98.7* 07/21/2013   PLT 150 07/21/2013      Chemistry      Component Value Date/Time   NA 142 07/14/2013 1312   NA 131* 04/05/2013 0545   K 3.8 07/14/2013 1312   K 3.8 04/05/2013 0545   CL 93* 04/05/2013 0545   CO2 26 07/14/2013 1312   CO2 27 04/05/2013 0545   BUN 31.6* 07/14/2013 1312   BUN 8 04/05/2013 0545   CREATININE 1.0 07/14/2013 1312   CREATININE 0.79 04/05/2013 0545      Component Value Date/Time   CALCIUM 8.8 07/14/2013 1312   CALCIUM 8.3* 04/05/2013 0545   ALKPHOS 72  07/14/2013 1312   ALKPHOS 76 05/13/2012 1005   AST 14 07/14/2013 1312   AST 16 05/13/2012 1005   ALT 10 07/14/2013 1312   ALT 11 05/13/2012 1005   BILITOT <0.20 07/14/2013 1312   BILITOT 0.5 05/13/2012 1005       RADIOGRAPHIC STUDIES: Dg Hip Bilateral W/pelvis  06/10/2013   CLINICAL DATA:  History metastatic lung carcinoma. Hip and leg pain.  EXAM: BILATERAL HIP WITH PELVIS - 4+ VIEW  COMPARISON:  None.  FINDINGS: No fracture.  No bone lesion.  Both hip joints are normally spaced and aligned. There is mild sclerosis along the superior acetabula. Mild bony remodeling is noted along the anterior superior head neck junctions, a finding which can be seen with femoral acetabular impingement. No other arthropathic change.  Bony pelvis is intact. SI joints and symphysis pubis are normally spaced and aligned. The bones are demineralized. Residual contrast is noted in a sigmoid colon diverticulum. Soft tissues are otherwise unremarkable.  IMPRESSION: 1. No fracture or acute finding. 2. No bone lesion to suggest metastatic disease.   Electronically Signed   By: Lajean Manes M.D.   On: 06/10/2013 14:04   Dg Femur Left  06/10/2013   CLINICAL DATA:  Hip pain and metastatic lung cancer.  EXAM: LEFT FEMUR - 2 VIEW  COMPARISON:  None.  FINDINGS: There is no evidence of fracture or other focal bone lesions. There is mild degenerative osteophytosis around the left hip joint. No significant hip joint narrowing. Radiographic density in the left hemipelvis represents debris within a sigmoid colonic diverticulum.  IMPRESSION: No acute osseous abnormality or evidence of bone metastasis.   Electronically Signed   By: Jorje Guild M.D.   On: 06/10/2013 15:05   Dg Femur Right  06/10/2013   CLINICAL DATA:  Hip and leg pain. History of metastatic lung carcinoma.  EXAM: RIGHT FEMUR - 2 VIEW  COMPARISON:  None.  FINDINGS: No fracture. No bone lesion. Hip and knee joints are normally aligned. Soft tissues are unremarkable.   IMPRESSION: Negative.   Electronically Signed   By: Lajean Manes M.D.   On: 06/10/2013 13:59   Ct Chest W Contrast  06/28/2013   CLINICAL DATA:  Metastatic non-small-cell lung cancer  EXAM: CT CHEST, ABDOMEN, AND PELVIS WITH CONTRAST  TECHNIQUE: Multidetector CT imaging of the chest, abdomen and pelvis was performed following the standard protocol during bolus administration of intravenous contrast.  CONTRAST:  113m OMNIPAQUE IOHEXOL 300 MG/ML  SOLN  COMPARISON:  04/21/2013  FINDINGS:   CT CHEST FINDINGS  Postprocedural changes with volume loss in the right hemithorax.  3.8 x 2.1 cm rounded lesion in the right middle lobe is unchanged (series 2/ image 38), favored to reflect rounded atelectasis. Additional rounded atelectasis in the right lower lobe (series 2/ image 46).  Chronic right lung empyema measuring 10.7 x 4.7 cm (series 2/ image 45), previously 11.2 x 4.9 cm, mildly decreased.  5 mm left lower lobe pulmonary nodule (series 4/ image 38), unchanged. No pneumothorax.  The heart is normal in size with rightward mediastinal shift. Mild atherosclerotic calcifications of the aortic arch.  Small mediastinal nodes including an 11 mm short axis precarinal node (series 2/ image 27), previously 10 mm. No suspicious axillary lymphadenopathy.  Left chest port terminating in the mid upper/mid SVC.    CT ABDOMEN AND PELVIS FINDINGS  Gastrostomy tube in satisfactory position.  Multiple hepatic cysts, including a 7.9 x 6.3 cm bilobed cyst in the central right liver (series 2/image 61).  Spleen, pancreas, and adrenal glands are within normal limits.  Status post cholecystectomy. No intrahepatic or extrahepatic ductal dilatation.  Right kidney is within normal limits. Suspected postsurgical changes in the lateral interpolar left kidney (series 2/image 34). No hydronephrosis.  No evidence of bowel obstruction. Colonic diverticulosis, without associated inflammatory changes.  Atherosclerotic calcifications of the  abdominal  aorta and branch vessels.  No abdominopelvic ascites.  No suspicious abdominopelvic lymphadenopathy.  Prostate is notable for enlargement of the central gland which indents the base of the bladder.  Bladder is within normal limits.  Degenerative changes of the lumbar spine.  No focal osseous lesions.   IMPRESSION: Postprocedural changes with volume loss in the right hemithorax.  Small mediastinal nodes measuring up to 11 mm short axis, grossly unchanged.  Suspected rounded atelectasis in the right middle and lower lobes.  Chronic right lung empyema, mildly decreased.  5 mm left lower lobe pulmonary nodule, unchanged.  No evidence of metastatic disease in the abdomen/pelvis.   Electronically Signed   By: Julian Hy M.D.   On: 06/28/2013 17:34   ASSESSMENT AND PLAN: This is is a 79 years old white male who was diagnosed with extensive stage small cell lung cancer status post systemic chemotherapy with carboplatin and etoposide with progressive disease on the right side of the chest. He was seen at [facility] by Dr. Sharlet Salina and was considered for a clinical trial with Ipilumomab and Nivolumab.  Recent repeat biopsy of a right pleural based nodule showed poorly differentiated non-small cell carcinoma. He is currently undergoing systemic chemotherapy with carboplatin and Abraxane status post 6 cycles. He tolerated the chemotherapy fairly well except for increasing fatigue secondary to chemotherapy-induced anemia as well as thrombocytopenia. His recent CT scan of the chest, abdomen and pelvis showed no evidence for disease progression. I discussed the scan results with the patient and his wife and recommended for him to continue his current treatment with carboplatin and Abraxane. His labs were reviewed and we will proceed with cycle #7 today as scheduled. He will return for a followup visit in 3 weeks for evaluation and management any adverse effect of his  treatment. For the pain management, the patient will continue on his current pain medication with fentanyl patch and Percocet. For the peripheral neuropathy, he will continue on reduced dose of Abraxane to 80 mg/M2 and I also increased his Neurontin dose to 300 mg by mouth 3 times a day. He was advised to call immediately if he has any concerning symptoms in the interval. The patient voices understanding of current disease status and treatment options and is in agreement with the current care plan.  All questions were answered. The patient knows to call the clinic with any problems, questions or concerns. We can certainly see the patient much sooner if necessary. I spent 20 minutes on face-to-face counseling with the patient and his wife today of the total visit time of 30 minutes.  Disclaimer: This note was dictated with voice recognition software. Similar sounding words can inadvertently be transcribed and may not be corrected upon review.  Carlton Adam, PA-C 07/21/2013

## 2013-07-22 NOTE — Patient Instructions (Addendum)
Continue labs and chemotherapy as scheduled Followup in 3 weeks prior to the start of your next scheduled cycle of chemotherapy

## 2013-07-25 ENCOUNTER — Other Ambulatory Visit: Payer: Self-pay | Admitting: Medical Oncology

## 2013-07-25 DIAGNOSIS — C3490 Malignant neoplasm of unspecified part of unspecified bronchus or lung: Secondary | ICD-10-CM

## 2013-07-25 MED ORDER — OXYCODONE HCL 5 MG PO TABS: 5.0000 mg | ORAL_TABLET | ORAL | Status: DC | PRN

## 2013-07-25 NOTE — Telephone Encounter (Signed)
rx locked in injection room for pick up.

## 2013-07-26 ENCOUNTER — Telehealth: Payer: Self-pay | Admitting: Internal Medicine

## 2013-07-26 NOTE — Telephone Encounter (Signed)
, °

## 2013-07-28 ENCOUNTER — Ambulatory Visit (HOSPITAL_BASED_OUTPATIENT_CLINIC_OR_DEPARTMENT_OTHER): Payer: Commercial Managed Care - HMO

## 2013-07-28 ENCOUNTER — Other Ambulatory Visit (HOSPITAL_BASED_OUTPATIENT_CLINIC_OR_DEPARTMENT_OTHER): Payer: Commercial Managed Care - HMO

## 2013-07-28 ENCOUNTER — Ambulatory Visit: Payer: Commercial Managed Care - HMO

## 2013-07-28 VITALS — BP 136/53 | HR 73 | Temp 98.7°F | Resp 18

## 2013-07-28 DIAGNOSIS — Z95828 Presence of other vascular implants and grafts: Secondary | ICD-10-CM

## 2013-07-28 DIAGNOSIS — C3491 Malignant neoplasm of unspecified part of right bronchus or lung: Secondary | ICD-10-CM

## 2013-07-28 DIAGNOSIS — C3490 Malignant neoplasm of unspecified part of unspecified bronchus or lung: Secondary | ICD-10-CM

## 2013-07-28 DIAGNOSIS — Z5111 Encounter for antineoplastic chemotherapy: Secondary | ICD-10-CM

## 2013-07-28 LAB — COMPREHENSIVE METABOLIC PANEL (CC13)
ALBUMIN: 2.8 g/dL — AB (ref 3.5–5.0)
ALT: 13 U/L (ref 0–55)
ANION GAP: 9 meq/L (ref 3–11)
AST: 18 U/L (ref 5–34)
Alkaline Phosphatase: 68 U/L (ref 40–150)
BUN: 31.1 mg/dL — AB (ref 7.0–26.0)
CALCIUM: 8.8 mg/dL (ref 8.4–10.4)
CHLORIDE: 109 meq/L (ref 98–109)
CO2: 25 meq/L (ref 22–29)
CREATININE: 1.1 mg/dL (ref 0.7–1.3)
GLUCOSE: 105 mg/dL (ref 70–140)
POTASSIUM: 4.2 meq/L (ref 3.5–5.1)
Sodium: 143 mEq/L (ref 136–145)
Total Bilirubin: 0.21 mg/dL (ref 0.20–1.20)
Total Protein: 6.3 g/dL — ABNORMAL LOW (ref 6.4–8.3)

## 2013-07-28 LAB — CBC WITH DIFFERENTIAL/PLATELET
BASO%: 0.7 % (ref 0.0–2.0)
Basophils Absolute: 0 10*3/uL (ref 0.0–0.1)
EOS ABS: 0.1 10*3/uL (ref 0.0–0.5)
EOS%: 1.7 % (ref 0.0–7.0)
HEMATOCRIT: 27.1 % — AB (ref 38.4–49.9)
HGB: 8.9 g/dL — ABNORMAL LOW (ref 13.0–17.1)
LYMPH%: 11 % — ABNORMAL LOW (ref 14.0–49.0)
MCH: 32.5 pg (ref 27.2–33.4)
MCHC: 32.8 g/dL (ref 32.0–36.0)
MCV: 99.2 fL — AB (ref 79.3–98.0)
MONO#: 0.5 10*3/uL (ref 0.1–0.9)
MONO%: 12 % (ref 0.0–14.0)
NEUT%: 74.6 % (ref 39.0–75.0)
NEUTROS ABS: 3.2 10*3/uL (ref 1.5–6.5)
Platelets: 138 10*3/uL — ABNORMAL LOW (ref 140–400)
RBC: 2.73 10*6/uL — ABNORMAL LOW (ref 4.20–5.82)
RDW: 19.1 % — ABNORMAL HIGH (ref 11.0–14.6)
WBC: 4.3 10*3/uL (ref 4.0–10.3)
lymph#: 0.5 10*3/uL — ABNORMAL LOW (ref 0.9–3.3)

## 2013-07-28 MED ORDER — ONDANSETRON 8 MG/NS 50 ML IVPB
INTRAVENOUS | Status: AC
  Filled 2013-07-28: qty 8

## 2013-07-28 MED ORDER — DEXAMETHASONE SODIUM PHOSPHATE 10 MG/ML IJ SOLN
INTRAMUSCULAR | Status: AC
  Filled 2013-07-28: qty 1

## 2013-07-28 MED ORDER — DEXAMETHASONE SODIUM PHOSPHATE 10 MG/ML IJ SOLN
10.0000 mg | Freq: Once | INTRAMUSCULAR | Status: AC
  Administered 2013-07-28: 10 mg via INTRAVENOUS

## 2013-07-28 MED ORDER — HEPARIN SOD (PORK) LOCK FLUSH 100 UNIT/ML IV SOLN
500.0000 [IU] | Freq: Once | INTRAVENOUS | Status: AC | PRN
  Administered 2013-07-28: 500 [IU]
  Filled 2013-07-28: qty 5

## 2013-07-28 MED ORDER — ONDANSETRON 8 MG/50ML IVPB (CHCC)
8.0000 mg | Freq: Once | INTRAVENOUS | Status: AC
  Administered 2013-07-28: 8 mg via INTRAVENOUS

## 2013-07-28 MED ORDER — SODIUM CHLORIDE 0.9 % IV SOLN
Freq: Once | INTRAVENOUS | Status: AC
Start: 2013-07-28 — End: 2013-07-28
  Administered 2013-07-28: 14:00:00 via INTRAVENOUS

## 2013-07-28 MED ORDER — SODIUM CHLORIDE 0.9 % IJ SOLN
10.0000 mL | INTRAMUSCULAR | Status: DC | PRN
  Administered 2013-07-28: 10 mL via INTRAVENOUS
  Filled 2013-07-28: qty 10

## 2013-07-28 MED ORDER — PACLITAXEL PROTEIN-BOUND CHEMO INJECTION 100 MG
80.0000 mg/m2 | Freq: Once | INTRAVENOUS | Status: AC
Start: 2013-07-28 — End: 2013-07-28
  Administered 2013-07-28: 150 mg via INTRAVENOUS
  Filled 2013-07-28: qty 30

## 2013-07-28 MED ORDER — SODIUM CHLORIDE 0.9 % IJ SOLN
10.0000 mL | INTRAMUSCULAR | Status: DC | PRN
  Administered 2013-07-28: 10 mL
  Filled 2013-07-28: qty 10

## 2013-07-28 NOTE — Patient Instructions (Signed)

## 2013-07-28 NOTE — Patient Instructions (Signed)
[facility] Discharge Instructions for Patients Receiving Chemotherapy  Today you received the following chemotherapy agents Abraxane.  To help prevent nausea and vomiting after your treatment, we encourage you to take your nausea medication as prescribed.   If you develop nausea and vomiting that is not controlled by your nausea medication, call the clinic.   BELOW ARE SYMPTOMS THAT SHOULD BE REPORTED IMMEDIATELY:  *FEVER GREATER THAN 100.5 F  *CHILLS WITH OR WITHOUT FEVER  NAUSEA AND VOMITING THAT IS NOT CONTROLLED WITH YOUR NAUSEA MEDICATION  *UNUSUAL SHORTNESS OF BREATH  *UNUSUAL BRUISING OR BLEEDING  TENDERNESS IN MOUTH AND THROAT WITH OR WITHOUT PRESENCE OF ULCERS  *URINARY PROBLEMS  *BOWEL PROBLEMS  UNUSUAL RASH Items with * indicate a potential emergency and should be followed up as soon as possible.  Feel free to call the clinic you have any questions or concerns. The clinic phone number is [phone].

## 2013-07-29 ENCOUNTER — Other Ambulatory Visit: Payer: Self-pay | Admitting: Specialist

## 2013-07-29 DIAGNOSIS — M545 Low back pain, unspecified: Secondary | ICD-10-CM

## 2013-08-03 ENCOUNTER — Encounter (HOSPITAL_COMMUNITY): Payer: Self-pay | Admitting: Pharmacy Technician

## 2013-08-04 ENCOUNTER — Other Ambulatory Visit (HOSPITAL_BASED_OUTPATIENT_CLINIC_OR_DEPARTMENT_OTHER): Payer: Commercial Managed Care - HMO

## 2013-08-04 ENCOUNTER — Ambulatory Visit (HOSPITAL_COMMUNITY)
Admission: RE | Admit: 2013-08-04 | Discharge: 2013-08-04 | Disposition: A | Discharge status: Home / Self Care | Payer: Medicare HMO | Source: Ambulatory Visit | Attending: Internal Medicine | Admitting: Internal Medicine

## 2013-08-04 ENCOUNTER — Other Ambulatory Visit: Payer: Self-pay | Admitting: Medical Oncology

## 2013-08-04 ENCOUNTER — Ambulatory Visit (HOSPITAL_BASED_OUTPATIENT_CLINIC_OR_DEPARTMENT_OTHER): Payer: Commercial Managed Care - HMO

## 2013-08-04 ENCOUNTER — Other Ambulatory Visit: Payer: Self-pay

## 2013-08-04 VITALS — BP 141/62 | HR 75 | Temp 99.2°F | Resp 16

## 2013-08-04 DIAGNOSIS — Z95828 Presence of other vascular implants and grafts: Secondary | ICD-10-CM

## 2013-08-04 DIAGNOSIS — C3491 Malignant neoplasm of unspecified part of right bronchus or lung: Secondary | ICD-10-CM

## 2013-08-04 DIAGNOSIS — C342 Malignant neoplasm of middle lobe, bronchus or lung: Secondary | ICD-10-CM

## 2013-08-04 DIAGNOSIS — D6481 Anemia due to antineoplastic chemotherapy: Secondary | ICD-10-CM | POA: Diagnosis present

## 2013-08-04 DIAGNOSIS — T451X5A Adverse effect of antineoplastic and immunosuppressive drugs, initial encounter: Secondary | ICD-10-CM

## 2013-08-04 DIAGNOSIS — Z5111 Encounter for antineoplastic chemotherapy: Secondary | ICD-10-CM

## 2013-08-04 DIAGNOSIS — C3490 Malignant neoplasm of unspecified part of unspecified bronchus or lung: Secondary | ICD-10-CM

## 2013-08-04 LAB — COMPREHENSIVE METABOLIC PANEL (CC13)
ALT: 16 U/L (ref 0–55)
ANION GAP: 6 meq/L (ref 3–11)
AST: 21 U/L (ref 5–34)
Albumin: 2.8 g/dL — ABNORMAL LOW (ref 3.5–5.0)
Alkaline Phosphatase: 76 U/L (ref 40–150)
BUN: 24.3 mg/dL (ref 7.0–26.0)
CHLORIDE: 109 meq/L (ref 98–109)
CO2: 26 meq/L (ref 22–29)
CREATININE: 0.9 mg/dL (ref 0.7–1.3)
Calcium: 8.7 mg/dL (ref 8.4–10.4)
Glucose: 120 mg/dl (ref 70–140)
Potassium: 4.2 mEq/L (ref 3.5–5.1)
Sodium: 142 mEq/L (ref 136–145)
Total Protein: 6.2 g/dL — ABNORMAL LOW (ref 6.4–8.3)

## 2013-08-04 LAB — CBC WITH DIFFERENTIAL/PLATELET
BASO%: 0.8 % (ref 0.0–2.0)
BASOS ABS: 0 10*3/uL (ref 0.0–0.1)
EOS%: 0.9 % (ref 0.0–7.0)
Eosinophils Absolute: 0 10*3/uL (ref 0.0–0.5)
HEMATOCRIT: 24.5 % — AB (ref 38.4–49.9)
HEMOGLOBIN: 8.1 g/dL — AB (ref 13.0–17.1)
LYMPH#: 0.5 10*3/uL — AB (ref 0.9–3.3)
LYMPH%: 12 % — AB (ref 14.0–49.0)
MCH: 32.6 pg (ref 27.2–33.4)
MCHC: 32.9 g/dL (ref 32.0–36.0)
MCV: 99.3 fL — ABNORMAL HIGH (ref 79.3–98.0)
MONO#: 0.6 10*3/uL (ref 0.1–0.9)
MONO%: 14.3 % — ABNORMAL HIGH (ref 0.0–14.0)
NEUT#: 3 10*3/uL (ref 1.5–6.5)
NEUT%: 72 % (ref 39.0–75.0)
PLATELETS: 111 10*3/uL — AB (ref 140–400)
RBC: 2.47 10*6/uL — ABNORMAL LOW (ref 4.20–5.82)
RDW: 19 % — ABNORMAL HIGH (ref 11.0–14.6)
WBC: 4.1 10*3/uL (ref 4.0–10.3)

## 2013-08-04 MED ORDER — PACLITAXEL PROTEIN-BOUND CHEMO INJECTION 100 MG
80.0000 mg/m2 | Freq: Once | INTRAVENOUS | Status: AC
  Administered 2013-08-04: 150 mg via INTRAVENOUS
  Filled 2013-08-04: qty 30

## 2013-08-04 MED ORDER — ONDANSETRON 8 MG/NS 50 ML IVPB
INTRAVENOUS | Status: AC
  Filled 2013-08-04: qty 8

## 2013-08-04 MED ORDER — DEXAMETHASONE SODIUM PHOSPHATE 10 MG/ML IJ SOLN
10.0000 mg | Freq: Once | INTRAMUSCULAR | Status: AC
  Administered 2013-08-04: 10 mg via INTRAVENOUS

## 2013-08-04 MED ORDER — HEPARIN SOD (PORK) LOCK FLUSH 100 UNIT/ML IV SOLN
500.0000 [IU] | Freq: Once | INTRAVENOUS | Status: AC | PRN
  Administered 2013-08-04: 500 [IU]
  Filled 2013-08-04: qty 5

## 2013-08-04 MED ORDER — OXYCODONE HCL 5 MG PO TABS: 5.0000 mg | ORAL_TABLET | ORAL | Status: DC | PRN

## 2013-08-04 MED ORDER — SODIUM CHLORIDE 0.9 % IV SOLN
Freq: Once | INTRAVENOUS | Status: AC
  Administered 2013-08-04: 15:00:00 via INTRAVENOUS

## 2013-08-04 MED ORDER — ONDANSETRON 8 MG/50ML IVPB (CHCC)
8.0000 mg | Freq: Once | INTRAVENOUS | Status: AC
  Administered 2013-08-04: 8 mg via INTRAVENOUS

## 2013-08-04 MED ORDER — SODIUM CHLORIDE 0.9 % IJ SOLN
10.0000 mL | INTRAMUSCULAR | Status: DC | PRN
  Administered 2013-08-04: 10 mL via INTRAVENOUS
  Filled 2013-08-04: qty 10

## 2013-08-04 MED ORDER — SODIUM CHLORIDE 0.9 % IJ SOLN
10.0000 mL | INTRAMUSCULAR | Status: DC | PRN
  Administered 2013-08-04: 10 mL
  Filled 2013-08-04: qty 10

## 2013-08-04 MED ORDER — DEXAMETHASONE SODIUM PHOSPHATE 10 MG/ML IJ SOLN
INTRAMUSCULAR | Status: AC
  Filled 2013-08-04: qty 1

## 2013-08-04 NOTE — Progress Notes (Signed)
Hgb down to 8.1. Pt. Denies feeling any more tired or SOB.  He rates his pain at a 10 and states it is because he is "missing a disc in his back"  He is scheduled for an MRI. Asked patient if he felt like he needed a blood transfusion (he has had them previously) and he said he didn't know.  Discussed with Dr. Julien Nordmann.  Patient is ok for treatment today and transfusion tomorrow.  Patient is agreeable with this.  Will type and cross today.

## 2013-08-04 NOTE — Telephone Encounter (Signed)
Wife will pick up Rx today.

## 2013-08-04 NOTE — Patient Instructions (Signed)
[facility] Discharge Instructions for Patients Receiving Chemotherapy  Today you received the following chemotherapy agents abraxane. Tomorrow you will return for a blood transfusion.  To help prevent nausea and vomiting after your treatment, we encourage you to take your nausea medication zofran.   If you develop nausea and vomiting that is not controlled by your nausea medication, call the clinic.   BELOW ARE SYMPTOMS THAT SHOULD BE REPORTED IMMEDIATELY:  *FEVER GREATER THAN 100.5 F  *CHILLS WITH OR WITHOUT FEVER  NAUSEA AND VOMITING THAT IS NOT CONTROLLED WITH YOUR NAUSEA MEDICATION  *UNUSUAL SHORTNESS OF BREATH  *UNUSUAL BRUISING OR BLEEDING  TENDERNESS IN MOUTH AND THROAT WITH OR WITHOUT PRESENCE OF ULCERS  *URINARY PROBLEMS  *BOWEL PROBLEMS  UNUSUAL RASH Items with * indicate a potential emergency and should be followed up as soon as possible.  Feel free to call the clinic you have any questions or concerns. The clinic phone number is [phone].

## 2013-08-05 ENCOUNTER — Ambulatory Visit (HOSPITAL_BASED_OUTPATIENT_CLINIC_OR_DEPARTMENT_OTHER): Payer: Commercial Managed Care - HMO

## 2013-08-05 ENCOUNTER — Other Ambulatory Visit: Payer: Self-pay | Admitting: *Deleted

## 2013-08-05 VITALS — BP 134/63 | HR 63 | Temp 98.3°F | Resp 18

## 2013-08-05 DIAGNOSIS — D6481 Anemia due to antineoplastic chemotherapy: Secondary | ICD-10-CM | POA: Diagnosis not present

## 2013-08-05 DIAGNOSIS — C3490 Malignant neoplasm of unspecified part of unspecified bronchus or lung: Secondary | ICD-10-CM

## 2013-08-05 DIAGNOSIS — T451X5A Adverse effect of antineoplastic and immunosuppressive drugs, initial encounter: Secondary | ICD-10-CM

## 2013-08-05 LAB — PREPARE RBC (CROSSMATCH)

## 2013-08-05 MED ORDER — OXYCODONE-ACETAMINOPHEN 5-325 MG PO TABS
1.0000 | ORAL_TABLET | Freq: Once | ORAL | Status: AC
  Administered 2013-08-05: 1 via ORAL

## 2013-08-05 MED ORDER — OXYCODONE-ACETAMINOPHEN 5-325 MG PO TABS
ORAL_TABLET | ORAL | Status: AC
  Filled 2013-08-05: qty 1

## 2013-08-05 MED ORDER — DIPHENHYDRAMINE HCL 25 MG PO CAPS
25.0000 mg | ORAL_CAPSULE | Freq: Once | ORAL | Status: AC
  Administered 2013-08-05: 25 mg via ORAL

## 2013-08-05 MED ORDER — LORAZEPAM 1 MG PO TABS: 1.0000 mg | ORAL_TABLET | Freq: Three times a day (TID) | ORAL | Status: DC | PRN

## 2013-08-05 MED ORDER — SODIUM CHLORIDE 0.9 % IJ SOLN
10.0000 mL | INTRAMUSCULAR | Status: AC | PRN
  Administered 2013-08-05: 10 mL
  Filled 2013-08-05: qty 10

## 2013-08-05 MED ORDER — DIPHENHYDRAMINE HCL 25 MG PO CAPS
ORAL_CAPSULE | ORAL | Status: AC
  Filled 2013-08-05: qty 1

## 2013-08-05 MED ORDER — HEPARIN SOD (PORK) LOCK FLUSH 100 UNIT/ML IV SOLN
500.0000 [IU] | Freq: Every day | INTRAVENOUS | Status: AC | PRN
  Administered 2013-08-05: 500 [IU]
  Filled 2013-08-05: qty 5

## 2013-08-05 MED ORDER — SODIUM CHLORIDE 0.9 % IV SOLN
250.0000 mL | Freq: Once | INTRAVENOUS | Status: AC
  Administered 2013-08-05: 250 mL via INTRAVENOUS

## 2013-08-05 MED ORDER — ACETAMINOPHEN 325 MG PO TABS: 650.0000 mg | ORAL_TABLET | Freq: Once | ORAL | Status: DC

## 2013-08-05 NOTE — Patient Instructions (Signed)

## 2013-08-06 LAB — TYPE AND SCREEN
ABO/RH(D): A POS
ANTIBODY SCREEN: NEGATIVE
Unit division: 0
Unit division: 0

## 2013-08-08 NOTE — Progress Notes (Signed)
I was unable to reach patient by phone.  I left  A message on voice mail.  I instructed the patient to arrive at [facility] entrance at 5:30am, register in Admitting.  Nothing to eat or drink after midnight.   I instructed the patient to take the following medications in the am with just enough water to get them down: Synthyroid, Gabapentin, Zanaflex, Hytrin.  May use Nasonex, andtake if needed- Oxy. Ativan, Zofran I asked patient to not wear any lotions, powders, cologne, jewelry, piercing, make-up or nail polish.  I asked the patient to call [phone], in the am if there were any questions or problems.

## 2013-08-09 ENCOUNTER — Encounter (HOSPITAL_COMMUNITY): Payer: Self-pay | Admitting: *Deleted

## 2013-08-09 ENCOUNTER — Encounter (HOSPITAL_COMMUNITY): Payer: Medicare HMO | Admitting: Anesthesiology

## 2013-08-09 ENCOUNTER — Ambulatory Visit (HOSPITAL_COMMUNITY): Payer: Medicare HMO | Admitting: Anesthesiology

## 2013-08-09 ENCOUNTER — Encounter (HOSPITAL_COMMUNITY)
Admission: RE | Disposition: A | Discharge status: Home / Self Care | Payer: Self-pay | Source: Ambulatory Visit | Attending: Specialist

## 2013-08-09 ENCOUNTER — Ambulatory Visit (HOSPITAL_COMMUNITY)
Admission: RE | Admit: 2013-08-09 | Discharge: 2013-08-09 | Disposition: A | Discharge status: Home / Self Care | Payer: Medicare HMO | Source: Ambulatory Visit | Attending: Specialist | Admitting: Specialist

## 2013-08-09 DIAGNOSIS — G4730 Sleep apnea, unspecified: Secondary | ICD-10-CM | POA: Diagnosis not present

## 2013-08-09 DIAGNOSIS — M545 Low back pain, unspecified: Secondary | ICD-10-CM | POA: Insufficient documentation

## 2013-08-09 DIAGNOSIS — Z79899 Other long term (current) drug therapy: Secondary | ICD-10-CM | POA: Insufficient documentation

## 2013-08-09 DIAGNOSIS — E039 Hypothyroidism, unspecified: Secondary | ICD-10-CM | POA: Insufficient documentation

## 2013-08-09 DIAGNOSIS — J45909 Unspecified asthma, uncomplicated: Secondary | ICD-10-CM | POA: Diagnosis not present

## 2013-08-09 DIAGNOSIS — K219 Gastro-esophageal reflux disease without esophagitis: Secondary | ICD-10-CM | POA: Insufficient documentation

## 2013-08-09 DIAGNOSIS — F411 Generalized anxiety disorder: Secondary | ICD-10-CM | POA: Diagnosis not present

## 2013-08-09 HISTORY — PX: RADIOLOGY WITH ANESTHESIA: SHX6223

## 2013-08-09 LAB — CBC
HEMATOCRIT: 31.5 % — AB (ref 39.0–52.0)
Hemoglobin: 10.3 g/dL — ABNORMAL LOW (ref 13.0–17.0)
MCH: 31.5 pg (ref 26.0–34.0)
MCHC: 32.7 g/dL (ref 30.0–36.0)
MCV: 96.3 fL (ref 78.0–100.0)
Platelets: 96 10*3/uL — ABNORMAL LOW (ref 150–400)
RBC: 3.27 MIL/uL — ABNORMAL LOW (ref 4.22–5.81)
RDW: 17.9 % — AB (ref 11.5–15.5)
WBC: 4.8 10*3/uL (ref 4.0–10.5)

## 2013-08-09 SURGERY — RADIOLOGY WITH ANESTHESIA
Anesthesia: Monitor Anesthesia Care

## 2013-08-09 MED ORDER — PROPOFOL INFUSION 10 MG/ML OPTIME
INTRAVENOUS | Status: AC | PRN
  Administered 2013-08-09: 40 ug/kg/min via INTRAVENOUS

## 2013-08-09 MED ORDER — GADOBENATE DIMEGLUMINE 529 MG/ML IV SOLN
20.0000 mL | Freq: Once | INTRAVENOUS | Status: AC
  Administered 2013-08-09: 17 mL via INTRAVENOUS

## 2013-08-09 MED ORDER — LACTATED RINGERS IV SOLN
INTRAVENOUS | Status: AC | PRN
  Administered 2013-08-09: 07:00:00 via INTRAVENOUS

## 2013-08-09 MED ORDER — MIDAZOLAM HCL 2 MG/2ML IJ SOLN
INTRAMUSCULAR | Status: AC | PRN
  Administered 2013-08-09 (×2): 1 mg via INTRAVENOUS

## 2013-08-09 MED ORDER — PROPOFOL 10 MG/ML IV BOLUS
INTRAVENOUS | Status: AC | PRN
  Administered 2013-08-09: 10 mg via INTRAVENOUS
  Administered 2013-08-09: 20 mg via INTRAVENOUS

## 2013-08-09 MED ORDER — ONDANSETRON HCL 4 MG/2ML IJ SOLN
INTRAMUSCULAR | Status: AC | PRN
  Administered 2013-08-09: 4 mg via INTRAVENOUS

## 2013-08-09 NOTE — Anesthesia Postprocedure Evaluation (Signed)
Anesthesia Post Note  Patient: Samuel Hurley  Procedure(s) Performed: Procedure(s) (LRB): MRI LUMBER SPINE WITHOUT CONTRAST (N/A)  Anesthesia type: MAC  Patient location: PACU  Post pain: Pain level controlled  Post assessment: Patient's Cardiovascular Status Stable  Last Vitals:  Filed Vitals:   08/09/13 1028  BP: 115/57  Pulse: 71  Temp:   Resp:     Post vital signs: Reviewed and stable  Level of consciousness: sedated  Complications: No apparent anesthesia complications

## 2013-08-09 NOTE — Anesthesia Preprocedure Evaluation (Addendum)
Anesthesia Evaluation  Patient identified by MRN, date of birth, ID band Patient awake    Reviewed: Allergy & Precautions, H&P , NPO status , Patient's Chart, lab work & pertinent test results  History of Anesthesia Complications Negative for: history of anesthetic complications  Airway Mallampati: III TM Distance: <3 FB Neck ROM: Full    Dental  (+) Teeth Intact, Dental Advisory Given   Pulmonary shortness of breath, asthma , sleep apnea , pneumonia -,  Lung cancer - chemo every Thursday   Pulmonary exam normal + decreased breath sounds      Cardiovascular negative cardio ROS      Neuro/Psych  Headaches, PSYCHIATRIC DISORDERS Anxiety    GI/Hepatic Neg liver ROS, GERD-  Controlled,  Endo/Other  Hypothyroidism   Renal/GU Renal disease     Musculoskeletal   Abdominal   Peds  Hematology   Anesthesia Other Findings   Reproductive/Obstetrics                         Anesthesia Physical Anesthesia Plan  ASA: III  Anesthesia Plan: MAC   Post-op Pain Management:    Induction: Intravenous  Airway Management Planned: Simple Face Mask  Additional Equipment:   Intra-op Plan:   Post-operative Plan:   Informed Consent: I have reviewed the patients History and Physical, chart, labs and discussed the procedure including the risks, benefits and alternatives for the proposed anesthesia with the patient or authorized representative who has indicated his/her understanding and acceptance.   Dental advisory given  Plan Discussed with: CRNA, Anesthesiologist and Surgeon  Anesthesia Plan Comments:        Anesthesia Quick Evaluation

## 2013-08-09 NOTE — Transfer of Care (Signed)
Immediate Anesthesia Transfer of Care Note  Patient: Samuel Hurley  Procedure(s) Performed: Procedure(s): MRI LUMBER SPINE WITHOUT CONTRAST (N/A)  Patient Location: PACU  Anesthesia Type:MAC  Level of Consciousness: awake, alert , oriented and patient cooperative  Airway & Oxygen Therapy: Patient Spontanous Breathing  Post-op Assessment: Report given to PACU RN and Post -op Vital signs reviewed and stable  Post vital signs: Reviewed  Complications: No apparent anesthesia complications

## 2013-08-09 NOTE — Discharge Instructions (Signed)

## 2013-08-10 ENCOUNTER — Other Ambulatory Visit: Payer: Self-pay | Admitting: *Deleted

## 2013-08-10 DIAGNOSIS — C3410 Malignant neoplasm of upper lobe, unspecified bronchus or lung: Secondary | ICD-10-CM

## 2013-08-10 MED ORDER — FENTANYL 50 MCG/HR TD PT72: 50.0000 ug | MEDICATED_PATCH | TRANSDERMAL | Status: DC

## 2013-08-11 ENCOUNTER — Ambulatory Visit: Payer: Commercial Managed Care - HMO

## 2013-08-11 ENCOUNTER — Other Ambulatory Visit: Payer: Commercial Managed Care - HMO

## 2013-08-11 ENCOUNTER — Other Ambulatory Visit (HOSPITAL_BASED_OUTPATIENT_CLINIC_OR_DEPARTMENT_OTHER): Payer: Commercial Managed Care - HMO

## 2013-08-11 ENCOUNTER — Ambulatory Visit (HOSPITAL_BASED_OUTPATIENT_CLINIC_OR_DEPARTMENT_OTHER): Payer: Commercial Managed Care - HMO

## 2013-08-11 ENCOUNTER — Encounter (HOSPITAL_COMMUNITY): Payer: Self-pay | Admitting: Radiology

## 2013-08-11 ENCOUNTER — Ambulatory Visit (HOSPITAL_BASED_OUTPATIENT_CLINIC_OR_DEPARTMENT_OTHER): Payer: Commercial Managed Care - HMO | Admitting: Internal Medicine

## 2013-08-11 VITALS — BP 147/70 | HR 75 | Temp 98.8°F | Resp 18 | Ht 72.0 in | Wt 169.0 lb

## 2013-08-11 DIAGNOSIS — C782 Secondary malignant neoplasm of pleura: Secondary | ICD-10-CM

## 2013-08-11 DIAGNOSIS — Z95828 Presence of other vascular implants and grafts: Secondary | ICD-10-CM

## 2013-08-11 DIAGNOSIS — C3491 Malignant neoplasm of unspecified part of right bronchus or lung: Secondary | ICD-10-CM

## 2013-08-11 DIAGNOSIS — M545 Low back pain, unspecified: Secondary | ICD-10-CM

## 2013-08-11 DIAGNOSIS — C342 Malignant neoplasm of middle lobe, bronchus or lung: Secondary | ICD-10-CM

## 2013-08-11 DIAGNOSIS — Z5111 Encounter for antineoplastic chemotherapy: Secondary | ICD-10-CM

## 2013-08-11 DIAGNOSIS — K5900 Constipation, unspecified: Secondary | ICD-10-CM

## 2013-08-11 LAB — COMPREHENSIVE METABOLIC PANEL (CC13)
ALBUMIN: 3 g/dL — AB (ref 3.5–5.0)
ALT: 10 U/L (ref 0–55)
AST: 15 U/L (ref 5–34)
Alkaline Phosphatase: 77 U/L (ref 40–150)
Anion Gap: 7 mEq/L (ref 3–11)
BUN: 20.6 mg/dL (ref 7.0–26.0)
CO2: 28 mEq/L (ref 22–29)
Calcium: 9.2 mg/dL (ref 8.4–10.4)
Chloride: 107 mEq/L (ref 98–109)
Creatinine: 0.9 mg/dL (ref 0.7–1.3)
GLUCOSE: 101 mg/dL (ref 70–140)
POTASSIUM: 4.2 meq/L (ref 3.5–5.1)
Sodium: 141 mEq/L (ref 136–145)
Total Bilirubin: <0.2 mg/dL (ref 0.20–1.20)
Total Protein: 6.7 g/dL (ref 6.4–8.3)

## 2013-08-11 LAB — CBC WITH DIFFERENTIAL/PLATELET
BASO%: 0.7 % (ref 0.0–2.0)
Basophils Absolute: 0 10*3/uL (ref 0.0–0.1)
EOS ABS: 0.1 10*3/uL (ref 0.0–0.5)
EOS%: 1.5 % (ref 0.0–7.0)
HCT: 29.8 % — ABNORMAL LOW (ref 38.4–49.9)
HGB: 9.8 g/dL — ABNORMAL LOW (ref 13.0–17.1)
LYMPH%: 10.7 % — ABNORMAL LOW (ref 14.0–49.0)
MCH: 31.9 pg (ref 27.2–33.4)
MCHC: 33 g/dL (ref 32.0–36.0)
MCV: 96.7 fL (ref 79.3–98.0)
MONO#: 0.5 10*3/uL (ref 0.1–0.9)
MONO%: 12.1 % (ref 0.0–14.0)
NEUT%: 75 % (ref 39.0–75.0)
NEUTROS ABS: 3.4 10*3/uL (ref 1.5–6.5)
Platelets: 99 10*3/uL — ABNORMAL LOW (ref 140–400)
RBC: 3.08 10*6/uL — AB (ref 4.20–5.82)
RDW: 20.4 % — ABNORMAL HIGH (ref 11.0–14.6)
WBC: 4.5 10*3/uL (ref 4.0–10.3)
lymph#: 0.5 10*3/uL — ABNORMAL LOW (ref 0.9–3.3)

## 2013-08-11 MED ORDER — HEPARIN SOD (PORK) LOCK FLUSH 100 UNIT/ML IV SOLN
500.0000 [IU] | Freq: Once | INTRAVENOUS | Status: AC | PRN
  Administered 2013-08-11: 500 [IU]
  Filled 2013-08-11: qty 5

## 2013-08-11 MED ORDER — CARBOPLATIN CHEMO INTRADERMAL TEST DOSE 100MCG/0.02ML
100.0000 ug | Freq: Once | INTRADERMAL | Status: AC
  Administered 2013-08-11: 100 ug via INTRADERMAL
  Filled 2013-08-11: qty 0.01

## 2013-08-11 MED ORDER — ONDANSETRON 16 MG/50ML IVPB (CHCC)
INTRAVENOUS | Status: AC
  Filled 2013-08-11: qty 16

## 2013-08-11 MED ORDER — DEXAMETHASONE SODIUM PHOSPHATE 20 MG/5ML IJ SOLN
INTRAMUSCULAR | Status: AC
  Filled 2013-08-11: qty 5

## 2013-08-11 MED ORDER — ONDANSETRON 16 MG/50ML IVPB (CHCC)
16.0000 mg | Freq: Once | INTRAVENOUS | Status: AC
  Administered 2013-08-11: 16 mg via INTRAVENOUS

## 2013-08-11 MED ORDER — SODIUM CHLORIDE 0.9 % IJ SOLN
10.0000 mL | INTRAMUSCULAR | Status: DC | PRN
  Administered 2013-08-11: 10 mL via INTRAVENOUS
  Filled 2013-08-11: qty 10

## 2013-08-11 MED ORDER — DEXAMETHASONE SODIUM PHOSPHATE 20 MG/5ML IJ SOLN
20.0000 mg | Freq: Once | INTRAMUSCULAR | Status: AC
  Administered 2013-08-11: 20 mg via INTRAVENOUS

## 2013-08-11 MED ORDER — PACLITAXEL PROTEIN-BOUND CHEMO INJECTION 100 MG
80.0000 mg/m2 | Freq: Once | INTRAVENOUS | Status: AC
  Administered 2013-08-11: 150 mg via INTRAVENOUS
  Filled 2013-08-11: qty 30

## 2013-08-11 MED ORDER — SODIUM CHLORIDE 0.9 % IJ SOLN
10.0000 mL | INTRAMUSCULAR | Status: DC | PRN
  Administered 2013-08-11: 10 mL
  Filled 2013-08-11: qty 10

## 2013-08-11 MED ORDER — SODIUM CHLORIDE 0.9 % IV SOLN
Freq: Once | INTRAVENOUS | Status: AC
  Administered 2013-08-11: 13:00:00 via INTRAVENOUS

## 2013-08-11 MED ORDER — SODIUM CHLORIDE 0.9 % IV SOLN
420.0000 mg | Freq: Once | INTRAVENOUS | Status: AC
  Administered 2013-08-11: 420 mg via INTRAVENOUS
  Filled 2013-08-11: qty 42

## 2013-08-11 NOTE — Patient Instructions (Signed)
[facility] Discharge Instructions for Patients Receiving Chemotherapy  Today you received the following chemotherapy agents:  Abraxane and Carboplatin  To help prevent nausea and vomiting after your treatment, we encourage you to take your nausea medication as ordered per MD.   If you develop nausea and vomiting that is not controlled by your nausea medication, call the clinic.   BELOW ARE SYMPTOMS THAT SHOULD BE REPORTED IMMEDIATELY:  *FEVER GREATER THAN 100.5 F  *CHILLS WITH OR WITHOUT FEVER  NAUSEA AND VOMITING THAT IS NOT CONTROLLED WITH YOUR NAUSEA MEDICATION  *UNUSUAL SHORTNESS OF BREATH  *UNUSUAL BRUISING OR BLEEDING  TENDERNESS IN MOUTH AND THROAT WITH OR WITHOUT PRESENCE OF ULCERS  *URINARY PROBLEMS  *BOWEL PROBLEMS  UNUSUAL RASH Items with * indicate a potential emergency and should be followed up as soon as possible.  Feel free to call the clinic you have any questions or concerns. The clinic phone number is [phone].

## 2013-08-11 NOTE — Progress Notes (Signed)
Samuel [phone]   [phone]) [phone]  OFFICE VISIT PROGRESS NOTE  Samuel Rio, PA-C [address] [place] [address]  DIAGNOSIS AND STAGE:  1) Poorly differentiated non-small cell lung Hurley diagnosed in December of 2014 2) Extensive stage small cell lung Hurley diagnosed in July 2014.  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 4)  History of oncocytic renal tumor status post radiofrequency ablation at [facility] in August of 2008.  PRIOR THERAPY: Systemic chemotherapy with carboplatin for AUC of 5 on day 1 and etoposide 120 mg/M2 on days 1, 2 and 3 with Neulasta support on day 4, status post 4 cycles. First dose was given on 08/18/2012.    CURRENT THERAPY: Systemic chemotherapy with carboplatin for AUC of 5 on day 1 and Abraxane 80 mg/M2 on days 1, 8 and 15 every 3 weeks, status post 5 cycles.  INTERVAL HISTORY: Samuel Hurley returns to the clinic today for followup visit accompanied by his wife Samuel Hurley.   On 01/29/2013, The patient is feeling much better today with no specific complaints except for the occasional right-sided chest pain and this is well-controlled with his current pain medication with fentanyl patch 50 mcg/hour every 3 days in addition to Percocet on as-needed basis. He takes around 2-3 Percocet on daily basis for breakthrough pain. His shortness of breath is much improved. The patient continues to have drainage from the Pleuryx catheter but this is down to less than 50 cc every 3 days and he is expected to have his Pleurx catheter removed and few weeks by Samuel Hurley at [facility]. He recently underwent biopsy of one of the right pleural-based nodule at [facility] and the final pathology was consistent with poorly differentiated non-small cell carcinoma. The tumor cells are of small size with hyperchromatic nuclei and some molded forms. Many of the cells have abundant cytoplasm and nuclei with prominent nucleoli. Strong immunoreactivity is observed for anti-keratins and CK5/6, with negative staining observed for calretinin, WT-1, CK7, desmin, TTF-1 , Fli-1, CD99 and CD56. The entirety of these findings is most consistent with non-small carcinoma, with immunohistochemical evidence of rudimentary squamous differentiation. Bright pankeratin/ CK5/6 immunoreactivity with negative staining for TTF-1 and CD56 is not typical of small cell carcinoma.  He was seen recently by Samuel Hurley who sent him to be treated locally for the recently diagnosed non-small cell carcinoma as the patient is not a candidate for the clinical trial with Ipilumomab and Nivolumab for the second line option for the previously diagnosed small cell lung Hurley. The patient was also seen recently at [facility] Hurley by Samuel Hurley who discussed within palliative systemic chemotherapy for his condition. The patient is not accepting the fact that he has an incurable condition and he still shopping around for other Hurley options. He also requested a referral to the [facility]. He came today for evaluation and discussion of his Hurley options after the recent diagnosis of non-small cell carcinoma.  On 02/21/2013, the patient present today for evaluation of his condition. He was seen by her medical oncologist in the last few weeks including Samuel Hurley at [facility] in [facility] as well as  a thoracic oncologist at [facility] in [place]. The visit records from Samuel Hurley are currently available to me and he is in agreement with the current evaluation of Samuel Hurley  including small cell lung Hurley which was treated with carboplatin and etoposide in addition to recently diagnosed poorly differentiated non-small cell carcinoma. He also concurs with the Hurley options provided for Samuel Hurley with carboplatin and Abraxane. The patient is here today for evaluation and consideration of starting Hurley for his newly diagnosed poorly differentiated non-small cell lung Hurley. He is feeling much better today with no specific complaints except for mild fatigue. His Pleurx catheter drains less than 75 cc of pleural fluid every 3 days. The molecular biomarkers performed at [facility] were negative for EGFR mutation and the ALK gene translocation.  On 03/17/2013, the patient is here today for evaluation accompanied by his wife. He tolerated the first cycle of his systemic chemotherapy with carboplatin and Abraxane fairly well with no significant adverse effects. He denied having any significant nausea or vomiting, no fever or chills. He lost 1 pound since his last visit. He has good appetite but concerned about dysphagia from his previous head and neck Hurley. I discussed with referred to gastroenterology for evaluation and consideration of PEG tube placement if needed but the patient declined and mentions that he is feeling okay for now. Pleurx catheter was removed yesterday by Samuel Hurley. He was supposed to start cycle #2 today but has low platelets count.  On 04/14/2013, The patient has been complaining of increasing fatigue and weakness as well as lack of appetite and dysphagia secondary to prior history of head and neck Hurley. He was recently admitted to [facility] for Hurley of dehydration and failure to thrive. During his admission he underwent PEG tube placement and he is currently receiving supplemental nutrition through the PEG tube. He is able to maintain his weight well.  The patient is too weak today to resume systemic  chemotherapy. I will keep his Hurley on hold for now. I would repeat CT scan of the chest, abdomen and pelvis for restaging of his disease before resuming her systemic therapy and if no improvement the patient may be considered for palliative care and hospice referral.  For the chemotherapy-induced anemia, I will arrange for the patient to receive 2 units of PRBCs transfusion.  On 04/25/2013 The patient came to the clinic today accompanied by his wife for routine followup visit. He is feeling much better after receiving 2 units of PRBCs transfusion. He has more energy. He also eats a little bit better with supplemental nutrition. He continues to have mild fatigue. He denied having any significant chest pain, shortness of breath, cough or hemoptysis. He had repeat CT scan of the chest, abdomen and pelvis and he is here for evaluation and discussion of his scan results.  On 05/19/2013, the patient is here today for evaluation. He tolerated the last cycle of his systemic chemotherapy with carboplatin and Abraxane fairly well except for increasing peripheral neuropathy mainly in the fingers. He has back pain recently and would like to have referral to see Dr. Sharol Given for evaluation. He denied having any significant fever or chills, no nausea or vomiting. He denied having any significant chest pain, shortness breath, cough or hemoptysis.  On 06/30/2013, the patient came to the clinic today accompanied by his wife. He tolerated the last cycle of his systemic chemotherapy fairly  well. He continues to have increasing pain and he described it from the toes to the head. He has been using increasing amount of oxycodone recently. He is currently also on Neurontin 200 mg by mouth 3 times a day. He is scheduled for MRI of the pelvis later this month and he requested that to be done under sedation. He denied having any significant chest pain but continues to have shortness of breath with exertion with no cough or  hemoptysis. He denied having any fever or chills. He had repeat CT scan of the chest, abdomen and pelvis performed recently and he is here for evaluation and discussion of his scan results.  On 08/11/2013, the patient is here today to start cycle #8 of his systemic chemotherapy with carboplatin and Abraxane. He tolerated the last cycle well with no specific complaints except for fatigue. He received 2 units of PRBCs transfusion a week ago. He continues to have pain in the lower back and he is currently under evaluation by Dr. Otelia Sergeant and expected to have surgical intervention in his back in the next few weeks. He is currently on several pain medication including Duragesic patch 50 mcg/hour every 3 days in addition to [facility] for breakthrough pain and gabapentin. He denied having any significant nausea or vomiting.  MEDICAL HISTORY: Past Medical History  Diagnosis Date  . Hyperlipidemia   . BPH (benign prostatic hypertrophy)   . Xerostomia   . Colon polyp     HYPERPLASTIC & TUBULAR ADENOMA([place])  . Diverticulosis of colon (without mention of hemorrhage)     ([place]  . Internal hemorrhoids without mention of complication     ([facility])  . GERD (gastroesophageal reflux disease)     (EGD-Dr. Corinda Gubler)  . Atrophic gastritis without mention of hemorrhage     (EGD-Dr. Corinda Gubler)  . History of radiation therapy   . Pharynx Hurley     squamous cell stage 4,s/p XRT,chemo, neck dissection  . Small cell lung Hurley   . Non-small cell lung Hurley   . Heart murmur   . OSA (obstructive sleep apnea)     does not use cpap dx 2005  . Shortness of breath   . Asthma     borderline  . Pneumonia   . Hypothyroidism   . Headache(784.0)   . Anemia   . Kidney disease     TCC  . Complication of anesthesia     "severe claustrophbia"  . Claustrophobia     "severe"    ALLERGIES:  is allergic to erythromycin and tetracycline.  MEDICATIONS:  Current Outpatient  Prescriptions  Medication Sig Dispense Refill  . docusate sodium (COLACE) 100 MG capsule Take 1 capsule (100 mg total) by mouth 2 (two) times daily.  100 capsule  prn  . fentaNYL (DURAGESIC - DOSED MCG/HR) 50 MCG/HR Place 1 patch (50 mcg total) onto the skin every 3 (three) days.  10 patch  0  . finasteride (PROSCAR) 5 MG tablet Take 1 tablet (5 mg total) by mouth at bedtime.  30 tablet  3  . gabapentin (NEURONTIN) 300 MG capsule Take 300 mg by mouth 3 (three) times daily.      Marland Kitchen levothyroxine (SYNTHROID, LEVOTHROID) 88 MCG tablet Take 1 tablet (88 mcg total) by mouth daily before breakfast.  30 tablet  3  . LORazepam (ATIVAN) 1 MG tablet Take 1 tablet (1 mg total) by mouth every 8 (eight) hours as needed for anxiety.  30 tablet  0  . magnesium citrate SOLN Take  1 Bottle by mouth as needed for severe constipation.      . mirtazapine (REMERON) 30 MG tablet Take 1 tablet (30 mg total) by mouth at bedtime.  30 tablet  0  . mometasone (NASONEX) 50 MCG/ACT nasal spray Place 2 sprays into the nose daily.      Marland Kitchen NUTRITIONAL SUPPLEMENT LIQD by PEG Tube route. Nutren 2.0.   2- 500 calorie bottles daily      . ondansetron (ZOFRAN) 8 MG tablet Take 8 mg by mouth every 8 (eight) hours as needed for nausea or vomiting.       Marland Kitchen oxaprozin (DAYPRO) 600 MG tablet Take 600 mg by mouth 2 (two) times daily.      Marland Kitchen oxyCODONE (OXY IR/ROXICODONE) 5 MG immediate release tablet Take 1 tablet (5 mg total) by mouth every 4 (four) hours as needed for moderate pain or severe pain.  60 tablet  0  . terazosin (HYTRIN) 1 MG capsule Take 1 mg by mouth 2 (two) times daily.       Marland Kitchen tiZANidine (ZANAFLEX) 4 MG capsule Take 4 mg by mouth 2 (two) times daily.       No current facility-administered medications for this visit.   Facility-Administered Medications Ordered in Other Visits  Medication Dose Route Frequency Provider Last Rate Last Dose  . lactated ringers infusion    Continuous PRN Jenne Campus, CRNA      . midazolam  (VERSED) injection    Anesthesia [place], CRNA   1 mg at 08/09/13 0855  . ondansetron [facility]) injection    Anesthesia Intra-op Jenne Campus, CRNA   4 mg at 08/09/13 0845  . propofol (DIPRIVAN) 10 mg/mL bolus/IV push    Anesthesia Intra-op Jenne Campus, CRNA   10 mg at 08/09/13 1000  . propofol (DIPRIVAN) infusion 10 mg/ml EMUL    Continuous PRN Jenne Campus, CRNA   20 mcg/kg/min at 08/09/13 7035    SURGICAL HISTORY:  Past Surgical History  Procedure Laterality Date  . Appendectomy    . Lumbar laminectomy    . Tonsillectomy    . Bilateral ganglionectomies Left   . Radical right neck dissection  2008  . Pci-rfa renal cell (tcc) Hurley  2009    pt denies  . Cholecystectomy  dec. 2009  . Septoplasty  03/01/12    with double turbinectomy  . Video bronchoscopy Bilateral 08/10/2012    Procedure: VIDEO BRONCHOSCOPY WITH FLUORO;  Surgeon: Rigoberto Noel, MD;  Location: [facility];  Service: Cardiopulmonary;  Laterality: Bilateral;  . Shoulder arthroscopy Left 2013  . Thoracentesis Right 10/2012  . Radiology with anesthesia N/A 07/07/2013    Procedure: MRI OF PELVIC;  Surgeon: Medication Radiologist, MD;  Location: [facility];  Service: Radiology;  Laterality: N/A;  DR. Chareese Sergent/MRI  . Radiology with anesthesia N/A 08/09/2013    Procedure: MRI LUMBER SPINE WITHOUT CONTRAST;  Surgeon: Medication Radiologist, MD;  Location: [facility];  Service: Radiology;  Laterality: N/A;    REVIEW OF SYSTEMS:  Constitutional: negative Eyes: negative Ears, nose, mouth, throat, and face: negative Respiratory: positive for pleurisy/chest pain Cardiovascular: negative Gastrointestinal: positive for dysphagia Genitourinary:negative Integument/breast: negative Hematologic/lymphatic: negative Musculoskeletal:positive for back pain Neurological: negative Behavioral/Psych: positive for anxiety, bad mood, depression, irritability and mood swings Endocrine: negative Allergic/Immunologic: negative     PHYSICAL EXAMINATION: General appearance: alert, cooperative and no distress Head: Normocephalic, without obvious abnormality, atraumatic Neck: no adenopathy and supple, symmetrical, trachea midline Lymph nodes: Cervical, supraclavicular, and  axillary nodes normal. Resp: clear to auscultation bilaterally Back: symmetric, no curvature. ROM normal. No CVA tenderness. Cardio: regular rate and rhythm, S1, S2 normal, no murmur, click, rub or gallop GI: soft, non-tender; bowel sounds normal; no masses,  no organomegaly Extremities: extremities normal, atraumatic, no cyanosis or edema Neurologic: Alert and oriented X 3, normal strength and tone. Normal symmetric reflexes. Normal coordination and gait   ECOG PERFORMANCE STATUS: 2 - Symptomatic, <50% confined to bed  Blood pressure 147/70, pulse 75, temperature 98.8 F (37.1 C), temperature source Oral, resp. rate 18, height 6' (1.829 m), weight 169 lb (76.658 kg), SpO2 100.00%.  LABORATORY DATA: Lab Results  Component Value Date   WBC 4.5 08/11/2013   HGB 9.8* 08/11/2013   HCT 29.8* 08/11/2013   MCV 96.7 08/11/2013   PLT 99* 08/11/2013      Chemistry      Component Value Date/Time   NA 141 08/11/2013 1035   NA 131* 04/05/2013 0545   K 4.2 08/11/2013 1035   K 3.8 04/05/2013 0545   CL 93* 04/05/2013 0545   CO2 28 08/11/2013 1035   CO2 27 04/05/2013 0545   BUN 20.6 08/11/2013 1035   BUN 8 04/05/2013 0545   CREATININE 0.9 08/11/2013 1035   CREATININE 0.79 04/05/2013 0545      Component Value Date/Time   CALCIUM 9.2 08/11/2013 1035   CALCIUM 8.3* 04/05/2013 0545   ALKPHOS 77 08/11/2013 1035   ALKPHOS 76 05/13/2012 1005   AST 15 08/11/2013 1035   AST 16 05/13/2012 1005   ALT 10 08/11/2013 1035   ALT 11 05/13/2012 1005   BILITOT <0.20 08/11/2013 1035   BILITOT 0.5 05/13/2012 1005       RADIOGRAPHIC STUDIES: Mr Lumbar Spine W Wo Contrast  08/09/2013   CLINICAL DATA:  Low back pain.  History of lung Hurley.  EXAM: MRI LUMBAR SPINE WITHOUT AND  WITH CONTRAST  TECHNIQUE: Multiplanar and multiecho pulse sequences of the lumbar spine were obtained without and with intravenous contrast.  CONTRAST:  67mL MULTIHANCE GADOBENATE DIMEGLUMINE 529 MG/ML IV SOLN  COMPARISON:  CT scan dated 06/28/2013  FINDINGS: Conus tip is at L2. Paraspinal soft tissues demonstrate a low signal intensity 12 mm lesion in the medial aspect of the lower pole of the left kidney, probably representing a hemorrhagic cyst. Lesion does not enhance after contrast and is not felt to represent a metastasis or a primary renal tumor.  L1-2 through L2-3. Normal discs. Minimal degenerative changes of facet joints at L2-3.  L3-4: Disc space narrowing with a small broad based disc protrusion central and to the left slightly compressing the left side of the thecal sac but without focal neural impingement.  Just below this level there are 2 tiny areas of abnormal enhancement in the nerve roots within the thecal sac. This is best seen on image 7 of series 3000 and on images 24 22, 24, and 25 of series 3300.  L4-5: Disc space narrowing. Small right far lateral disc bulge and osteophytes without neural impingement.  L5-S1: Marked disc space narrowing. No disc bulging or osteophyte formation. Previous left laminectomy. The patient has dystrophic calcifications around the the thecal sac at S1 and S2 as well as septations within the thecal sac behind the L5 vertebral body. These are felt to be scars secondary to the prior surgery.  IMPRESSION: 1. 2 tiny areas of focal nerve root enhancement behind the L4 vertebral body. This is nonspecific but given the patient's multiple malignancies, the  possibility of intradural metastases must be considered. 2. Small disc protrusion at L3-4 to the left of midline without neural impingement. 3. Postsurgical scarring and calcification in and around around the thecal sac at L5-S1 extending to the S2 level.   Electronically Signed   By: Rozetta Nunnery M.D.   On: 08/09/2013  11:13   ASSESSMENT AND PLAN: This is is a 79 years old white Hurley who was diagnosed with extensive stage small cell lung Hurley status post systemic chemotherapy with carboplatin and etoposide with progressive disease on the right side of the chest. He was seen at [facility] by Samuel Hurley and was considered for a clinical trial with Ipilumomab and Nivolumab.  Recent repeat biopsy of a right pleural based nodule showed poorly differentiated non-small cell carcinoma. He is currently undergoing systemic chemotherapy with carboplatin and Abraxane status post 7 cycles. He tolerated the chemotherapy fairly well except for increasing fatigue secondary to chemotherapy-induced anemia as well as thrombocytopenia. We will proceed with cycle #8 today as scheduled. He would come back for followup visit in 3 weeks for evaluation after repeating CT scan of the chest, abdomen and pelvis for restaging of his disease. For the pain management, the patient will continue on his current pain medication with fentanyl patch and OxyIR. He is also followed by Dr. Louanne Skye for the persistent back pain. For the peripheral neuropathy, he will continue on reduced dose of Abraxane to 80 mg/M2 and I also increased his Neurontin dose to 300 mg by mouth 3 times a day. He was advised to call immediately if he has any concerning symptoms in the interval. The patient voices understanding of current disease status and Hurley options and is in agreement with the current care plan.  All questions were answered. The patient knows to call the clinic with any problems, questions or concerns. We can certainly see the patient much sooner if necessary.  Disclaimer: This note was dictated with voice recognition software. Similar sounding words can inadvertently be transcribed and may not be corrected upon review.  Eilleen Kempf., MD 08/11/2013

## 2013-08-11 NOTE — Patient Instructions (Signed)

## 2013-08-12 ENCOUNTER — Telehealth: Payer: Self-pay | Admitting: Internal Medicine

## 2013-08-12 NOTE — Telephone Encounter (Signed)
s.w. pt and advised on appt....pt will pick up new sched at nxt visit

## 2013-08-14 ENCOUNTER — Other Ambulatory Visit: Payer: Self-pay | Admitting: Internal Medicine

## 2013-08-14 DIAGNOSIS — C3490 Malignant neoplasm of unspecified part of unspecified bronchus or lung: Secondary | ICD-10-CM

## 2013-08-15 ENCOUNTER — Other Ambulatory Visit: Payer: Self-pay | Admitting: Medical Oncology

## 2013-08-15 ENCOUNTER — Other Ambulatory Visit: Payer: Self-pay | Admitting: *Deleted

## 2013-08-15 DIAGNOSIS — C3490 Malignant neoplasm of unspecified part of unspecified bronchus or lung: Secondary | ICD-10-CM

## 2013-08-15 DIAGNOSIS — G629 Polyneuropathy, unspecified: Secondary | ICD-10-CM

## 2013-08-15 MED ORDER — GABAPENTIN 300 MG PO CAPS: 300.0000 mg | ORAL_CAPSULE | Freq: Three times a day (TID) | ORAL | Status: AC

## 2013-08-15 MED ORDER — LORAZEPAM 1 MG PO TABS: 1.0000 mg | ORAL_TABLET | Freq: Three times a day (TID) | ORAL | Status: DC | PRN

## 2013-08-15 MED ORDER — OXYCODONE HCL 5 MG PO TABS: 5.0000 mg | ORAL_TABLET | ORAL | Status: DC | PRN

## 2013-08-15 NOTE — Telephone Encounter (Signed)
rx locked in injection room.

## 2013-08-18 ENCOUNTER — Ambulatory Visit: Payer: Commercial Managed Care - HMO

## 2013-08-18 ENCOUNTER — Other Ambulatory Visit (HOSPITAL_BASED_OUTPATIENT_CLINIC_OR_DEPARTMENT_OTHER): Payer: Commercial Managed Care - HMO

## 2013-08-18 ENCOUNTER — Ambulatory Visit (HOSPITAL_BASED_OUTPATIENT_CLINIC_OR_DEPARTMENT_OTHER): Payer: Commercial Managed Care - HMO

## 2013-08-18 ENCOUNTER — Encounter: Payer: Self-pay | Admitting: Internal Medicine

## 2013-08-18 VITALS — BP 122/72 | HR 82 | Temp 98.3°F | Resp 18

## 2013-08-18 DIAGNOSIS — C3491 Malignant neoplasm of unspecified part of right bronchus or lung: Secondary | ICD-10-CM

## 2013-08-18 DIAGNOSIS — Z95828 Presence of other vascular implants and grafts: Secondary | ICD-10-CM

## 2013-08-18 DIAGNOSIS — C342 Malignant neoplasm of middle lobe, bronchus or lung: Secondary | ICD-10-CM

## 2013-08-18 DIAGNOSIS — C782 Secondary malignant neoplasm of pleura: Secondary | ICD-10-CM

## 2013-08-18 LAB — COMPREHENSIVE METABOLIC PANEL (CC13)
ALT: 8 U/L (ref 0–55)
AST: 17 U/L (ref 5–34)
Albumin: 2.7 g/dL — ABNORMAL LOW (ref 3.5–5.0)
Alkaline Phosphatase: 78 U/L (ref 40–150)
Anion Gap: 8 mEq/L (ref 3–11)
BUN: 22.3 mg/dL (ref 7.0–26.0)
CALCIUM: 8.7 mg/dL (ref 8.4–10.4)
CHLORIDE: 104 meq/L (ref 98–109)
CO2: 27 meq/L (ref 22–29)
Creatinine: 1 mg/dL (ref 0.7–1.3)
Glucose: 136 mg/dl (ref 70–140)
Potassium: 3.6 mEq/L (ref 3.5–5.1)
Sodium: 139 mEq/L (ref 136–145)
Total Bilirubin: 0.26 mg/dL (ref 0.20–1.20)
Total Protein: 6.3 g/dL — ABNORMAL LOW (ref 6.4–8.3)

## 2013-08-18 LAB — CBC WITH DIFFERENTIAL/PLATELET
BASO%: 0.7 % (ref 0.0–2.0)
BASOS ABS: 0 10*3/uL (ref 0.0–0.1)
EOS ABS: 0 10*3/uL (ref 0.0–0.5)
EOS%: 0.8 % (ref 0.0–7.0)
HCT: 30.1 % — ABNORMAL LOW (ref 38.4–49.9)
HEMOGLOBIN: 9.8 g/dL — AB (ref 13.0–17.1)
LYMPH#: 0.4 10*3/uL — AB (ref 0.9–3.3)
LYMPH%: 7.6 % — ABNORMAL LOW (ref 14.0–49.0)
MCH: 31.6 pg (ref 27.2–33.4)
MCHC: 32.6 g/dL (ref 32.0–36.0)
MCV: 96.9 fL (ref 79.3–98.0)
MONO#: 0.7 10*3/uL (ref 0.1–0.9)
MONO%: 12.7 % (ref 0.0–14.0)
NEUT%: 78.2 % — ABNORMAL HIGH (ref 39.0–75.0)
NEUTROS ABS: 4.5 10*3/uL (ref 1.5–6.5)
Platelets: 84 10*3/uL — ABNORMAL LOW (ref 140–400)
RBC: 3.11 10*6/uL — ABNORMAL LOW (ref 4.20–5.82)
RDW: 19.8 % — AB (ref 11.0–14.6)
WBC: 5.7 10*3/uL (ref 4.0–10.3)

## 2013-08-18 MED ORDER — SODIUM CHLORIDE 0.9 % IJ SOLN
10.0000 mL | INTRAMUSCULAR | Status: DC | PRN
  Administered 2013-08-18: 10 mL via INTRAVENOUS
  Filled 2013-08-18: qty 10

## 2013-08-18 MED ORDER — HEPARIN SOD (PORK) LOCK FLUSH 100 UNIT/ML IV SOLN
500.0000 [IU] | Freq: Once | INTRAVENOUS | Status: AC
Start: 2013-08-18 — End: 2013-08-18
  Administered 2013-08-18: 500 [IU] via INTRAVENOUS
  Filled 2013-08-18: qty 5

## 2013-08-18 NOTE — Patient Instructions (Signed)
Thrombocytopenia Thrombocytopenia means there are not enough platelets in your blood. Platelets are tiny cells in your blood. When you start bleeding, platelets clump together around the cut or injury to stop the bleeding. This process is called blood clotting. Not having enough platelets can cause bleeding problems. HOME CARE  Check your skin and inside your mouth for bruises or blood as told by your doctor.  Check your spit (sputum), pee (urine), and poop (stool) for blood as told by your doctor.  Do not do activities that can cause bumps or bruises until your doctor says it is okay.  Be careful not to cut yourself when you shave or use scissors, needles, knives, or other tools.  Be careful not to burn yourself when you iron or cook.  Ask your doctor if you can drink alcohol.  Only take medicines as told by your doctor.  Tell all your doctors and your dentist that you have this bleeding problem. GET HELP RIGHT AWAY IF:  You are bleeding anywhere on your body.  You are bleeding or have bruises without knowing why.  You have blood in your spit, pee, or poop. MAKE SURE YOU:  Understand these instructions.  Will watch your condition.  Will get help right away if you are not doing well or get worse. Document Released: 12/26/2010 Document Revised: 03/31/2011 Document Reviewed: 12/26/2010 [facility] Patient Information 2015 [place], [place]. This information is not intended to replace advice given to you by your health care provider. Make sure you discuss any questions you have with your health care provider.

## 2013-08-18 NOTE — Progress Notes (Signed)
Per Dr. Julien Nordmann will skip this week's treatment due to platelet count of 84. Samuel Hurley and wife aware.

## 2013-08-18 NOTE — Progress Notes (Signed)
Patient bought in bank statement and will bring back bills. Wife will call billing about [facility] bills. I advised of the 400.00 [place].

## 2013-08-19 ENCOUNTER — Other Ambulatory Visit: Payer: Self-pay | Admitting: Specialist

## 2013-08-19 ENCOUNTER — Encounter: Payer: Self-pay | Admitting: Internal Medicine

## 2013-08-19 DIAGNOSIS — M5126 Other intervertebral disc displacement, lumbar region: Secondary | ICD-10-CM

## 2013-08-19 NOTE — Progress Notes (Signed)
Approved for Abraxane asst via PAN [id number]  7500.00   08/19/13-08/19/14. Sending copy to billing and medical records

## 2013-08-22 ENCOUNTER — Ambulatory Visit
Admission: RE | Admit: 2013-08-22 | Discharge: 2013-08-22 | Disposition: A | Discharge status: Home / Self Care | Payer: Commercial Managed Care - HMO | Source: Ambulatory Visit | Attending: Specialist | Admitting: Specialist

## 2013-08-22 DIAGNOSIS — M5126 Other intervertebral disc displacement, lumbar region: Secondary | ICD-10-CM

## 2013-08-22 MED ORDER — IOHEXOL 180 MG/ML  SOLN
1.0000 mL | Freq: Once | INTRAMUSCULAR | Status: AC | PRN
  Administered 2013-08-22: 1 mL via EPIDURAL

## 2013-08-22 MED ORDER — METHYLPREDNISOLONE ACETATE 40 MG/ML INJ SUSP (RADIOLOG
120.0000 mg | Freq: Once | INTRAMUSCULAR | Status: AC
  Administered 2013-08-22: 120 mg via EPIDURAL

## 2013-08-22 NOTE — Discharge Instructions (Signed)

## 2013-08-25 ENCOUNTER — Ambulatory Visit: Payer: Commercial Managed Care - HMO

## 2013-08-25 ENCOUNTER — Ambulatory Visit (HOSPITAL_BASED_OUTPATIENT_CLINIC_OR_DEPARTMENT_OTHER): Payer: Commercial Managed Care - HMO

## 2013-08-25 ENCOUNTER — Other Ambulatory Visit (HOSPITAL_BASED_OUTPATIENT_CLINIC_OR_DEPARTMENT_OTHER): Payer: Commercial Managed Care - HMO

## 2013-08-25 VITALS — BP 111/69 | HR 59 | Temp 98.1°F | Resp 20

## 2013-08-25 DIAGNOSIS — Z95828 Presence of other vascular implants and grafts: Secondary | ICD-10-CM

## 2013-08-25 DIAGNOSIS — C782 Secondary malignant neoplasm of pleura: Secondary | ICD-10-CM

## 2013-08-25 DIAGNOSIS — Z5111 Encounter for antineoplastic chemotherapy: Secondary | ICD-10-CM

## 2013-08-25 DIAGNOSIS — C342 Malignant neoplasm of middle lobe, bronchus or lung: Secondary | ICD-10-CM

## 2013-08-25 DIAGNOSIS — C3491 Malignant neoplasm of unspecified part of right bronchus or lung: Secondary | ICD-10-CM

## 2013-08-25 LAB — COMPREHENSIVE METABOLIC PANEL (CC13)
ALBUMIN: 2.7 g/dL — AB (ref 3.5–5.0)
ALK PHOS: 63 U/L (ref 40–150)
AST: 13 U/L (ref 5–34)
Anion Gap: 11 mEq/L (ref 3–11)
BILIRUBIN TOTAL: 0.26 mg/dL (ref 0.20–1.20)
BUN: 30.5 mg/dL — ABNORMAL HIGH (ref 7.0–26.0)
CO2: 26 mEq/L (ref 22–29)
Calcium: 9.3 mg/dL (ref 8.4–10.4)
Chloride: 106 mEq/L (ref 98–109)
Creatinine: 1.1 mg/dL (ref 0.7–1.3)
Glucose: 101 mg/dl (ref 70–140)
POTASSIUM: 3.9 meq/L (ref 3.5–5.1)
SODIUM: 143 meq/L (ref 136–145)
TOTAL PROTEIN: 6.5 g/dL (ref 6.4–8.3)

## 2013-08-25 LAB — CBC WITH DIFFERENTIAL/PLATELET
BASO%: 0.7 % (ref 0.0–2.0)
Basophils Absolute: 0 10*3/uL (ref 0.0–0.1)
EOS ABS: 0 10*3/uL (ref 0.0–0.5)
EOS%: 0.6 % (ref 0.0–7.0)
HCT: 27.7 % — ABNORMAL LOW (ref 38.4–49.9)
HGB: 9.1 g/dL — ABNORMAL LOW (ref 13.0–17.1)
LYMPH%: 9.1 % — AB (ref 14.0–49.0)
MCH: 32.2 pg (ref 27.2–33.4)
MCHC: 32.7 g/dL (ref 32.0–36.0)
MCV: 98.3 fL — ABNORMAL HIGH (ref 79.3–98.0)
MONO#: 1 10*3/uL — ABNORMAL HIGH (ref 0.1–0.9)
MONO%: 16.9 % — AB (ref 0.0–14.0)
NEUT%: 72.7 % (ref 39.0–75.0)
NEUTROS ABS: 4.1 10*3/uL (ref 1.5–6.5)
PLATELETS: 103 10*3/uL — AB (ref 140–400)
RBC: 2.82 10*6/uL — AB (ref 4.20–5.82)
RDW: 21.6 % — AB (ref 11.0–14.6)
WBC: 5.6 10*3/uL (ref 4.0–10.3)
lymph#: 0.5 10*3/uL — ABNORMAL LOW (ref 0.9–3.3)

## 2013-08-25 MED ORDER — DEXAMETHASONE SODIUM PHOSPHATE 10 MG/ML IJ SOLN
INTRAMUSCULAR | Status: AC
  Filled 2013-08-25: qty 1

## 2013-08-25 MED ORDER — DEXAMETHASONE SODIUM PHOSPHATE 10 MG/ML IJ SOLN
10.0000 mg | Freq: Once | INTRAMUSCULAR | Status: AC
  Administered 2013-08-25: 10 mg via INTRAVENOUS

## 2013-08-25 MED ORDER — SODIUM CHLORIDE 0.9 % IJ SOLN
10.0000 mL | INTRAMUSCULAR | Status: DC | PRN
  Administered 2013-08-25: 10 mL via INTRAVENOUS
  Filled 2013-08-25: qty 10

## 2013-08-25 MED ORDER — ONDANSETRON 8 MG/50ML IVPB (CHCC)
8.0000 mg | Freq: Once | INTRAVENOUS | Status: AC
  Administered 2013-08-25: 8 mg via INTRAVENOUS

## 2013-08-25 MED ORDER — ONDANSETRON 8 MG/NS 50 ML IVPB
INTRAVENOUS | Status: AC
  Filled 2013-08-25: qty 8

## 2013-08-25 MED ORDER — SODIUM CHLORIDE 0.9 % IV SOLN
Freq: Once | INTRAVENOUS | Status: AC
  Administered 2013-08-25: 14:00:00 via INTRAVENOUS

## 2013-08-25 MED ORDER — OXYCODONE-ACETAMINOPHEN 5-325 MG PO TABS
ORAL_TABLET | ORAL | Status: AC
  Filled 2013-08-25: qty 1

## 2013-08-25 MED ORDER — SODIUM CHLORIDE 0.9 % IJ SOLN
10.0000 mL | INTRAMUSCULAR | Status: DC | PRN
  Administered 2013-08-25: 10 mL
  Filled 2013-08-25: qty 10

## 2013-08-25 MED ORDER — PACLITAXEL PROTEIN-BOUND CHEMO INJECTION 100 MG
80.0000 mg/m2 | Freq: Once | INTRAVENOUS | Status: AC
  Administered 2013-08-25: 150 mg via INTRAVENOUS
  Filled 2013-08-25: qty 30

## 2013-08-25 MED ORDER — HEPARIN SOD (PORK) LOCK FLUSH 100 UNIT/ML IV SOLN
500.0000 [IU] | Freq: Once | INTRAVENOUS | Status: AC | PRN
  Administered 2013-08-25: 500 [IU]
  Filled 2013-08-25: qty 5

## 2013-08-25 MED ORDER — OXYCODONE-ACETAMINOPHEN 5-325 MG PO TABS
1.0000 | ORAL_TABLET | Freq: Once | ORAL | Status: AC
  Administered 2013-08-25: 1 via ORAL

## 2013-08-25 NOTE — Patient Instructions (Signed)
[facility] Discharge Instructions for Patients Receiving Chemotherapy  Today you received the following chemotherapy agents Abraxane  To help prevent nausea and vomiting after your treatment, we encourage you to take your nausea medication as needed   If you develop nausea and vomiting that is not controlled by your nausea medication, call the clinic.   BELOW ARE SYMPTOMS THAT SHOULD BE REPORTED IMMEDIATELY:  *FEVER GREATER THAN 100.5 F  *CHILLS WITH OR WITHOUT FEVER  NAUSEA AND VOMITING THAT IS NOT CONTROLLED WITH YOUR NAUSEA MEDICATION  *UNUSUAL SHORTNESS OF BREATH  *UNUSUAL BRUISING OR BLEEDING  TENDERNESS IN MOUTH AND THROAT WITH OR WITHOUT PRESENCE OF ULCERS  *URINARY PROBLEMS  *BOWEL PROBLEMS  UNUSUAL RASH Items with * indicate a potential emergency and should be followed up as soon as possible.  Feel free to call the clinic you have any questions or concerns. The clinic phone number is [phone].

## 2013-08-26 ENCOUNTER — Encounter: Payer: Self-pay | Admitting: Medical Oncology

## 2013-08-26 NOTE — Progress Notes (Signed)
Pain control-Per Dr Otho Ket note of 08/17/13 he increased fentanyl patch to 100 mcg /he q 72 hours and increased oxycodone 5 mg without tylenol to 1-3 tablets every 4 hours.

## 2013-08-30 ENCOUNTER — Ambulatory Visit (HOSPITAL_COMMUNITY)
Admission: RE | Admit: 2013-08-30 | Discharge: 2013-08-30 | Disposition: A | Discharge status: Home / Self Care | Payer: Medicare HMO | Source: Ambulatory Visit | Attending: Internal Medicine | Admitting: Internal Medicine

## 2013-08-30 ENCOUNTER — Encounter (HOSPITAL_COMMUNITY): Payer: Self-pay

## 2013-08-30 DIAGNOSIS — Z85819 Personal history of malignant neoplasm of unspecified site of lip, oral cavity, and pharynx: Secondary | ICD-10-CM | POA: Insufficient documentation

## 2013-08-30 DIAGNOSIS — N400 Enlarged prostate without lower urinary tract symptoms: Secondary | ICD-10-CM | POA: Diagnosis not present

## 2013-08-30 DIAGNOSIS — K5730 Diverticulosis of large intestine without perforation or abscess without bleeding: Secondary | ICD-10-CM | POA: Insufficient documentation

## 2013-08-30 DIAGNOSIS — J984 Other disorders of lung: Secondary | ICD-10-CM | POA: Diagnosis not present

## 2013-08-30 DIAGNOSIS — C3491 Malignant neoplasm of unspecified part of right bronchus or lung: Secondary | ICD-10-CM

## 2013-08-30 DIAGNOSIS — R599 Enlarged lymph nodes, unspecified: Secondary | ICD-10-CM | POA: Diagnosis not present

## 2013-08-30 DIAGNOSIS — C3490 Malignant neoplasm of unspecified part of unspecified bronchus or lung: Secondary | ICD-10-CM | POA: Insufficient documentation

## 2013-08-30 DIAGNOSIS — K5900 Constipation, unspecified: Secondary | ICD-10-CM

## 2013-08-30 DIAGNOSIS — Z85810 Personal history of malignant neoplasm of tongue: Secondary | ICD-10-CM | POA: Diagnosis not present

## 2013-08-30 DIAGNOSIS — K7689 Other specified diseases of liver: Secondary | ICD-10-CM | POA: Insufficient documentation

## 2013-08-30 DIAGNOSIS — Z85528 Personal history of other malignant neoplasm of kidney: Secondary | ICD-10-CM | POA: Diagnosis not present

## 2013-08-30 DIAGNOSIS — J90 Pleural effusion, not elsewhere classified: Secondary | ICD-10-CM | POA: Insufficient documentation

## 2013-08-30 DIAGNOSIS — Z9221 Personal history of antineoplastic chemotherapy: Secondary | ICD-10-CM | POA: Diagnosis not present

## 2013-08-30 MED ORDER — IOHEXOL 300 MG/ML  SOLN
100.0000 mL | Freq: Once | INTRAMUSCULAR | Status: AC | PRN
  Administered 2013-08-30: 100 mL via INTRAVENOUS

## 2013-08-30 MED ORDER — IOHEXOL 300 MG/ML  SOLN
50.0000 mL | Freq: Once | INTRAMUSCULAR | Status: AC | PRN
  Administered 2013-08-30: 50 mL via ORAL

## 2013-09-01 ENCOUNTER — Ambulatory Visit (HOSPITAL_COMMUNITY)
Admission: RE | Admit: 2013-09-01 | Discharge: 2013-09-01 | Disposition: A | Discharge status: Home / Self Care | Payer: Medicare HMO | Source: Ambulatory Visit | Attending: Internal Medicine | Admitting: Internal Medicine

## 2013-09-01 ENCOUNTER — Ambulatory Visit (HOSPITAL_BASED_OUTPATIENT_CLINIC_OR_DEPARTMENT_OTHER): Payer: Commercial Managed Care - HMO | Admitting: Physician Assistant

## 2013-09-01 ENCOUNTER — Encounter: Payer: Self-pay | Admitting: Physician Assistant

## 2013-09-01 ENCOUNTER — Telehealth: Payer: Self-pay | Admitting: *Deleted

## 2013-09-01 ENCOUNTER — Ambulatory Visit: Payer: Commercial Managed Care - HMO

## 2013-09-01 ENCOUNTER — Ambulatory Visit (HOSPITAL_BASED_OUTPATIENT_CLINIC_OR_DEPARTMENT_OTHER): Payer: Commercial Managed Care - HMO

## 2013-09-01 ENCOUNTER — Other Ambulatory Visit: Payer: Commercial Managed Care - HMO

## 2013-09-01 ENCOUNTER — Other Ambulatory Visit (HOSPITAL_BASED_OUTPATIENT_CLINIC_OR_DEPARTMENT_OTHER): Payer: Commercial Managed Care - HMO

## 2013-09-01 ENCOUNTER — Telehealth: Payer: Self-pay | Admitting: Physician Assistant

## 2013-09-01 VITALS — BP 112/54 | HR 63 | Temp 99.0°F | Resp 18

## 2013-09-01 DIAGNOSIS — C3490 Malignant neoplasm of unspecified part of unspecified bronchus or lung: Secondary | ICD-10-CM

## 2013-09-01 DIAGNOSIS — D696 Thrombocytopenia, unspecified: Secondary | ICD-10-CM

## 2013-09-01 DIAGNOSIS — C782 Secondary malignant neoplasm of pleura: Secondary | ICD-10-CM

## 2013-09-01 DIAGNOSIS — D6481 Anemia due to antineoplastic chemotherapy: Secondary | ICD-10-CM | POA: Insufficient documentation

## 2013-09-01 DIAGNOSIS — R5381 Other malaise: Secondary | ICD-10-CM

## 2013-09-01 DIAGNOSIS — M545 Low back pain, unspecified: Secondary | ICD-10-CM

## 2013-09-01 DIAGNOSIS — R5383 Other fatigue: Secondary | ICD-10-CM

## 2013-09-01 DIAGNOSIS — Z95828 Presence of other vascular implants and grafts: Secondary | ICD-10-CM

## 2013-09-01 DIAGNOSIS — C3491 Malignant neoplasm of unspecified part of right bronchus or lung: Secondary | ICD-10-CM

## 2013-09-01 DIAGNOSIS — T451X5A Adverse effect of antineoplastic and immunosuppressive drugs, initial encounter: Principal | ICD-10-CM

## 2013-09-01 DIAGNOSIS — C342 Malignant neoplasm of middle lobe, bronchus or lung: Secondary | ICD-10-CM

## 2013-09-01 DIAGNOSIS — R1310 Dysphagia, unspecified: Secondary | ICD-10-CM

## 2013-09-01 DIAGNOSIS — R079 Chest pain, unspecified: Secondary | ICD-10-CM

## 2013-09-01 DIAGNOSIS — G609 Hereditary and idiopathic neuropathy, unspecified: Secondary | ICD-10-CM

## 2013-09-01 DIAGNOSIS — F341 Dysthymic disorder: Secondary | ICD-10-CM

## 2013-09-01 LAB — CBC WITH DIFFERENTIAL/PLATELET
BASO%: 0.6 % (ref 0.0–2.0)
BASOS ABS: 0 10*3/uL (ref 0.0–0.1)
EOS%: 0.9 % (ref 0.0–7.0)
Eosinophils Absolute: 0 10*3/uL (ref 0.0–0.5)
HCT: 25.2 % — ABNORMAL LOW (ref 38.4–49.9)
HGB: 8.2 g/dL — ABNORMAL LOW (ref 13.0–17.1)
LYMPH%: 8.2 % — ABNORMAL LOW (ref 14.0–49.0)
MCH: 32.2 pg (ref 27.2–33.4)
MCHC: 32.7 g/dL (ref 32.0–36.0)
MCV: 98.6 fL — ABNORMAL HIGH (ref 79.3–98.0)
MONO#: 0.6 10*3/uL (ref 0.1–0.9)
MONO%: 12.2 % (ref 0.0–14.0)
NEUT#: 4.1 10*3/uL (ref 1.5–6.5)
NEUT%: 78.1 % — ABNORMAL HIGH (ref 39.0–75.0)
Platelets: 94 10*3/uL — ABNORMAL LOW (ref 140–400)
RBC: 2.56 10*6/uL — ABNORMAL LOW (ref 4.20–5.82)
RDW: 21.6 % — AB (ref 11.0–14.6)
WBC: 5.3 10*3/uL (ref 4.0–10.3)
lymph#: 0.4 10*3/uL — ABNORMAL LOW (ref 0.9–3.3)

## 2013-09-01 LAB — COMPREHENSIVE METABOLIC PANEL (CC13)
ALK PHOS: 57 U/L (ref 40–150)
ALT: 8 U/L (ref 0–55)
AST: 17 U/L (ref 5–34)
Albumin: 2.5 g/dL — ABNORMAL LOW (ref 3.5–5.0)
Anion Gap: 7 mEq/L (ref 3–11)
BUN: 22.6 mg/dL (ref 7.0–26.0)
CO2: 28 mEq/L (ref 22–29)
Calcium: 8.9 mg/dL (ref 8.4–10.4)
Chloride: 108 mEq/L (ref 98–109)
Creatinine: 1.2 mg/dL (ref 0.7–1.3)
Glucose: 98 mg/dl (ref 70–140)
POTASSIUM: 3.8 meq/L (ref 3.5–5.1)
Sodium: 142 mEq/L (ref 136–145)
Total Protein: 6.3 g/dL — ABNORMAL LOW (ref 6.4–8.3)

## 2013-09-01 LAB — PREPARE RBC (CROSSMATCH)

## 2013-09-01 MED ORDER — DIPHENHYDRAMINE HCL 25 MG PO CAPS
25.0000 mg | ORAL_CAPSULE | Freq: Once | ORAL | Status: AC
  Administered 2013-09-01: 25 mg via ORAL

## 2013-09-01 MED ORDER — SODIUM CHLORIDE 0.9 % IJ SOLN
10.0000 mL | INTRAMUSCULAR | Status: DC | PRN
  Administered 2013-09-01: 10 mL via INTRAVENOUS
  Filled 2013-09-01: qty 10

## 2013-09-01 MED ORDER — DIPHENHYDRAMINE HCL 25 MG PO CAPS
ORAL_CAPSULE | ORAL | Status: AC
  Filled 2013-09-01: qty 1

## 2013-09-01 MED ORDER — OXYCODONE HCL 5 MG PO TABS: 5.0000 mg | ORAL_TABLET | ORAL | Status: DC | PRN

## 2013-09-01 MED ORDER — SODIUM CHLORIDE 0.9 % IV SOLN
250.0000 mL | Freq: Once | INTRAVENOUS | Status: AC
  Administered 2013-09-01: 250 mL via INTRAVENOUS

## 2013-09-01 MED ORDER — SODIUM CHLORIDE 0.9 % IJ SOLN
10.0000 mL | INTRAMUSCULAR | Status: AC | PRN
  Administered 2013-09-01: 10 mL
  Filled 2013-09-01: qty 10

## 2013-09-01 MED ORDER — ACETAMINOPHEN 325 MG PO TABS
ORAL_TABLET | ORAL | Status: AC
  Filled 2013-09-01: qty 2

## 2013-09-01 MED ORDER — HEPARIN SOD (PORK) LOCK FLUSH 100 UNIT/ML IV SOLN
500.0000 [IU] | Freq: Every day | INTRAVENOUS | Status: AC | PRN
  Administered 2013-09-01: 500 [IU]
  Filled 2013-09-01: qty 5

## 2013-09-01 MED ORDER — ACETAMINOPHEN 325 MG PO TABS
650.0000 mg | ORAL_TABLET | Freq: Once | ORAL | Status: AC
  Administered 2013-09-01: 650 mg via ORAL

## 2013-09-01 NOTE — Progress Notes (Addendum)
[facility] [phone]   [phone]  SHARED VISIT PROGRESS NOTE  Samuel Rio, PA-C [address] [place] [address]  DIAGNOSIS AND STAGE:  1) Poorly differentiated non-small cell lung cancer diagnosed in December of 2014 2) Extensive stage small cell lung cancer diagnosed in July 2014.  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 4)  History of oncocytic renal tumor status post radiofrequency ablation at [facility] in August of 2008.  PRIOR THERAPY: Systemic chemotherapy with carboplatin for AUC of 5 on day 1 and etoposide 120 mg/M2 on days 1, 2 and 3 with Neulasta support on day 4, status post 4 cycles. First dose was given on 08/18/2012.    CURRENT THERAPY: Systemic chemotherapy with carboplatin for AUC of 5 on day 1 and Abraxane 80 mg/M2 on days 1, 8 and 15 every 3 weeks, status post 8 cycles.  INTERVAL HISTORY: [facility] 79 y.o. male returns to the clinic today for followup visit accompanied by his wife Samuel [facility].   On 01/29/2013, The patient is feeling much better today with no specific complaints except for the occasional right-sided chest pain and this is well-controlled with his current pain medication with fentanyl patch 50 mcg/hour every 3 days in addition to Percocet on as-needed basis. He takes around 2-3 Percocet on daily basis for breakthrough pain. His shortness of breath is much improved. The patient continues to have drainage from the Pleuryx catheter but this is down to less than 50 cc every 3 days and he is expected to have his Pleurx catheter removed and few weeks by Dr. Kerin [facility] at [facility]. He recently underwent biopsy of one of the right pleural-based nodule at [facility] and the final pathology was consistent with poorly differentiated non-small cell carcinoma. The tumor cells are of small size with hyperchromatic nuclei and some molded forms. Many of the cells have abundant cytoplasm and nuclei with prominent nucleoli. Strong immunoreactivity is observed for anti-keratins and CK5/6, with negative staining observed for calretinin, WT-1, CK7, desmin, TTF-1 , Fli-1, CD99 and CD56. The entirety of these findings is most consistent with non-small carcinoma, with immunohistochemical evidence of rudimentary squamous differentiation. Bright pankeratin/ CK5/6 immunoreactivity with negative staining for TTF-1 and CD56 is not typical of small cell carcinoma.  He was seen recently by Dr. Sharlet [facility] who sent him to be treated locally for the recently diagnosed non-small cell carcinoma as the patient is not a candidate for the clinical trial with Ipilumomab and Nivolumab for the second line option for the previously diagnosed small cell lung cancer. The patient was also seen recently at [facility] by Dr. Lewanda [facility] who discussed within palliative systemic chemotherapy for his condition. The patient is not accepting the fact that he has an incurable condition and he still shopping around for other treatment options. He also requested a referral to the [facility]. He came today for evaluation and discussion of his treatment options after the recent diagnosis of non-small cell carcinoma.  On 02/21/2013, the patient present today for evaluation of his condition. He was seen by her medical oncologist in the last few weeks including Samuel [facility] at [facility] in [facility] as well as  a thoracic oncologist at [facility] in [place]. The visit records from Samuel Hurley are currently available to me and he is in agreement with the current evaluation of Samuel [facility] regarding dealing with 2 different lung cancer  including small cell lung cancer which was treated with carboplatin and etoposide in addition to recently diagnosed poorly differentiated non-small cell carcinoma. He also concurs with the treatment options provided for Samuel [facility] including treatment with carboplatin and Abraxane. The patient is here today for evaluation and consideration of starting treatment for his newly diagnosed poorly differentiated non-small cell lung cancer. He is feeling much better today with no specific complaints except for mild fatigue. His Pleurx catheter drains less than 75 cc of pleural fluid every 3 days. The molecular biomarkers performed at [facility] were negative for EGFR mutation and the ALK gene translocation.  On 03/17/2013, the patient is here today for evaluation accompanied by his wife. He tolerated the first cycle of his systemic chemotherapy with carboplatin and Abraxane fairly well with no significant adverse effects. He denied having any significant nausea or vomiting, no fever or chills. He lost 1 pound since his last visit. He has good appetite but concerned about dysphagia from his previous head and neck cancer. I discussed with referred to gastroenterology for evaluation and consideration of PEG tube placement if needed but the patient declined and mentions that he is feeling okay for now. Pleurx catheter was removed yesterday by Dr. Kerin [facility]. He was supposed to start cycle #2 today but has low platelets count.  On 04/14/2013, The patient has been complaining of increasing fatigue and weakness as well as lack of appetite and dysphagia secondary to prior history of head and neck cancer. He was recently admitted to [facility] for treatment of dehydration and failure to thrive. During his admission he underwent PEG tube placement and he is currently receiving supplemental nutrition through the PEG tube. He is able to maintain his weight well.  The patient is too weak today to resume systemic  chemotherapy. I will keep his treatment on hold for now. I would repeat CT scan of the chest, abdomen and pelvis for restaging of his disease before resuming her systemic therapy and if no improvement the patient may be considered for palliative care and hospice referral.  For the chemotherapy-induced anemia, I will arrange for the patient to receive 2 units of PRBCs transfusion.  On 04/25/2013 The patient came to the clinic today accompanied by his wife for routine followup visit. He is feeling much better after receiving 2 units of PRBCs transfusion. He has more energy. He also eats a little bit better with supplemental nutrition. He continues to have mild fatigue. He denied having any significant chest pain, shortness of breath, cough or hemoptysis. He had repeat CT scan of the chest, abdomen and pelvis and he is here for evaluation and discussion of his scan results.  On 05/19/2013, the patient is here today for evaluation. He tolerated the last cycle of his systemic chemotherapy with carboplatin and Abraxane fairly well except for increasing peripheral neuropathy mainly in the fingers. He has back pain recently and would like to have referral to see Dr. Sharol Given for evaluation. He denied having any significant fever or chills, no nausea or vomiting. He denied having any significant chest pain, shortness breath, cough or hemoptysis.  On 06/30/2013, the patient came to the clinic today accompanied by his wife. He tolerated the last cycle of his systemic chemotherapy fairly  well. He continues to have increasing pain and he described it from the toes to the head. He has been using increasing amount of oxycodone recently. He is currently also on Neurontin 200 mg by mouth 3 times a day. He is scheduled for MRI of the pelvis later this month and he requested that to be done under sedation. He denied having any significant chest pain but continues to have shortness of breath with exertion with no cough or  hemoptysis. He denied having any fever or chills. He had repeat CT scan of the chest, abdomen and pelvis performed and presented for evaluation and discussion of his scan results.  On 08/11/2013, the patient is here today to start cycle #8 of his systemic chemotherapy with carboplatin and Abraxane. He tolerated the last cycle well with no specific complaints except for fatigue. He received 2 units of PRBCs transfusion a week ago. He continues to have pain in the lower back and he is currently under evaluation by Dr. Louanne Skye and expected to have surgical intervention in his back in the next few weeks. He is currently on several pain medication including Duragesic patch 50 mcg/hour every 3 days in addition to [facility] for breakthrough pain and gabapentin. He denied having any significant nausea or vomiting.  On 09/01/2013, patient presents accompanied by his wife Samuel [facility] or a scheduled followup appointment. He is due to start cycle #9 of his systemic chemotherapy with carboplatin and Abraxane. He had recent restaging CT scan of the chest, abdomen and pelvis and presents to discuss the results. He was evaluated by Dr. Louanne Skye who has referred him to [facility] neurology for further evaluation and management of his back pain. He requests a refill for his oxycodone tablets. He currently takes 2 tablets by mouth 3 times daily with reasonable relief. Overall is tolerating this chemotherapy relatively well and voiced no other specific complaints today other than that of continued fatigue.  MEDICAL HISTORY: Past Medical History  Diagnosis Date  . Hyperlipidemia   . BPH (benign prostatic hypertrophy)   . Xerostomia   . Colon polyp     HYPERPLASTIC & TUBULAR ADENOMA(Colonoscopy-[facility])  . Diverticulosis of colon (without mention of hemorrhage)     ([facility])  . Internal hemorrhoids without mention of complication     (Colonoscopy-[facility])  . GERD (gastroesophageal reflux disease)     (EGD-Dr.  Velora Heckler)  . Atrophic gastritis without mention of hemorrhage     (EGD-Dr. Velora Heckler)  . History of radiation therapy   . Pharynx cancer     squamous cell stage 4,s/p XRT,chemo, neck dissection  . Small cell lung cancer   . Non-small cell lung cancer   . Heart murmur   . OSA (obstructive sleep apnea)     does not use cpap dx 2005  . Shortness of breath   . Asthma     borderline  . Pneumonia   . Hypothyroidism   . Headache(784.0)   . Anemia   . Kidney disease     TCC  . Complication of anesthesia     "severe claustrophbia"  . Claustrophobia     "severe"    ALLERGIES:  is allergic to erythromycin and tetracycline.  MEDICATIONS:  Current Outpatient Prescriptions  Medication Sig Dispense Refill  . docusate sodium (COLACE) 100 MG capsule Take 1 capsule (100 mg total) by mouth 2 (two) times daily.  100 capsule  prn  . fentaNYL (DURAGESIC - DOSED MCG/HR) 50 MCG/HR Place 100 mcg onto the skin every 3 (three) days.      Marland Kitchen  finasteride (PROSCAR) 5 MG tablet Take 1 tablet (5 mg total) by mouth at bedtime.  30 tablet  3  . gabapentin (NEURONTIN) 300 MG capsule Take 1 capsule (300 mg total) by mouth 3 (three) times daily.  60 capsule  0  . levothyroxine (SYNTHROID, LEVOTHROID) 88 MCG tablet Take 1 tablet (88 mcg total) by mouth daily before breakfast.  30 tablet  3  . LORazepam (ATIVAN) 1 MG tablet Take 1 tablet (1 mg total) by mouth every 8 (eight) hours as needed for anxiety.  30 tablet  0  . magnesium citrate SOLN Take 1 Bottle by mouth as needed for severe constipation.      . mirtazapine (REMERON) 30 MG tablet TAKE ONE TABLET AT BEDTIME.  30 tablet  1  . mometasone (NASONEX) 50 MCG/ACT nasal spray Place 2 sprays into the nose daily.      Marland Kitchen NUTRITIONAL SUPPLEMENT LIQD by PEG Tube route. Nutren 2.0.   2- 500 calorie bottles daily      . oxaprozin (DAYPRO) 600 MG tablet Take 600 mg by mouth 2 (two) times daily.      Marland Kitchen oxyCODONE (OXY IR/ROXICODONE) 5 MG immediate release tablet Take 1  tablet (5 mg total) by mouth every 4 (four) hours as needed for moderate pain or severe pain.  90 tablet  0  . terazosin (HYTRIN) 1 MG capsule Take 1 mg by mouth 2 (two) times daily.       Marland Kitchen tiZANidine (ZANAFLEX) 4 MG capsule Take 4 mg by mouth 2 (two) times daily.      . ondansetron (ZOFRAN) 8 MG tablet Take 8 mg by mouth every 8 (eight) hours as needed for nausea or vomiting.        No current facility-administered medications for this visit.   Facility-Administered Medications Ordered in Other Visits  Medication Dose Route Frequency Provider Last Rate Last Dose  . lactated ringers infusion    Continuous PRN Jenne Campus, CRNA      . midazolam (VERSED) injection    Anesthesia [place], CRNA   1 mg at 08/09/13 0855  . ondansetron [facility]) injection    Anesthesia Intra-op Jenne Campus, CRNA   4 mg at 08/09/13 0845  . propofol (DIPRIVAN) 10 mg/mL bolus/IV push    Anesthesia Intra-op Jenne Campus, CRNA   10 mg at 08/09/13 1000  . propofol (DIPRIVAN) infusion 10 mg/ml EMUL    Continuous PRN Jenne Campus, CRNA   20 mcg/kg/min at 08/09/13 0539    SURGICAL HISTORY:  Past Surgical History  Procedure Laterality Date  . Appendectomy    . Lumbar laminectomy    . Tonsillectomy    . Bilateral ganglionectomies Left   . Radical right neck dissection  2008  . Pci-rfa renal cell (tcc) cancer  2009    pt denies  . Cholecystectomy  dec. 2009  . Septoplasty  03/01/12    with double turbinectomy  . Video bronchoscopy Bilateral 08/10/2012    Procedure: VIDEO BRONCHOSCOPY WITH FLUORO;  Surgeon: Rigoberto Noel, MD;  Location: [facility];  Service: Cardiopulmonary;  Laterality: Bilateral;  . Shoulder arthroscopy Left 2013  . Thoracentesis Right 10/2012  . Radiology with anesthesia N/A 07/07/2013    Procedure: MRI OF PELVIC;  Surgeon: Medication Radiologist, MD;  Location: [facility];  Service: Radiology;  Laterality: N/A;  DR. MOHAMED/MRI  . Radiology with anesthesia N/A 08/09/2013     Procedure: MRI LUMBER SPINE WITHOUT CONTRAST;  Surgeon: Medication Radiologist,  MD;  Location: [facility];  Service: Radiology;  Laterality: N/A;    REVIEW OF SYSTEMS:  Constitutional: negative Eyes: negative Ears, nose, mouth, throat, and face: negative Respiratory: positive for pleurisy/chest pain Cardiovascular: negative Gastrointestinal: positive for dysphagia Genitourinary:negative Integument/breast: negative Hematologic/lymphatic: negative Musculoskeletal:positive for back pain Neurological: negative Behavioral/Psych: positive for anxiety, bad mood, depression, irritability and mood swings Endocrine: negative Allergic/Immunologic: negative   PHYSICAL EXAMINATION: General appearance: alert, cooperative and no distress Head: Normocephalic, without obvious abnormality, atraumatic Neck: no adenopathy and supple, symmetrical, trachea midline Lymph nodes: Cervical, supraclavicular, and axillary nodes normal. Resp: clear to auscultation bilaterally Back: symmetric, no curvature. ROM normal. No CVA tenderness. Cardio: regular rate and rhythm, S1, S2 normal, no murmur, click, rub or gallop GI: soft, non-tender; bowel sounds normal; no masses,  no organomegaly Extremities: extremities normal, atraumatic, no cyanosis or edema Neurologic: Alert and oriented X 3, normal strength and tone. Normal symmetric reflexes. Normal coordination and gait   ECOG PERFORMANCE STATUS: 2 - Symptomatic, <50% confined to bed  There were no vitals taken for this visit.  LABORATORY DATA: Lab Results  Component Value Date   WBC 5.3 09/01/2013   HGB 8.2* 09/01/2013   HCT 25.2* 09/01/2013   MCV 98.6* 09/01/2013   PLT 94* 09/01/2013      Chemistry      Component Value Date/Time   NA 142 09/01/2013 1101   NA 131* 04/05/2013 0545   K 3.8 09/01/2013 1101   K 3.8 04/05/2013 0545   CL 93* 04/05/2013 0545   CO2 28 09/01/2013 1101   CO2 27 04/05/2013 0545   BUN 22.6 09/01/2013 1101   BUN 8 04/05/2013 0545    CREATININE 1.2 09/01/2013 1101   CREATININE 0.79 04/05/2013 0545      Component Value Date/Time   CALCIUM 8.9 09/01/2013 1101   CALCIUM 8.3* 04/05/2013 0545   ALKPHOS 57 09/01/2013 1101   ALKPHOS 76 05/13/2012 1005   AST 17 09/01/2013 1101   AST 16 05/13/2012 1005   ALT 8 09/01/2013 1101   ALT 11 05/13/2012 1005   BILITOT <0.20 09/01/2013 1101   BILITOT 0.5 05/13/2012 1005       RADIOGRAPHIC STUDIES: Ct Chest W Contrast  08/30/2013   CLINICAL DATA:  Followup metastatic non-small cell lung carcinoma. Currently undergoing chemotherapy. Personal history of renal cell carcinoma, tongue carcinoma, and pharyngeal carcinoma.  EXAM: CT CHEST, ABDOMEN, AND PELVIS WITH CONTRAST  TECHNIQUE: Multidetector CT imaging of the chest, abdomen and pelvis was performed following the standard protocol during bolus administration of intravenous contrast.  CONTRAST:  163m OMNIPAQUE IOHEXOL 300 MG/ML  SOLN  COMPARISON:  06/28/2013  FINDINGS: CT CHEST FINDINGS  Mediastinum/Hilar Regions: Mild right paratracheal lymphadenopathy is unchanged, with largest lymph node measuring 11 mm on image 26. No new or increased areas of lymphadenopathy identified.  Lungs: Right lung volume loss again demonstrated. Rounded subpleural areas of opacity in the right middle and lower lobes are stable, most consistent with rounded atelectasis. No new or enlarging pulmonary nodules or masses are identified. 5 mm nodular density in the left lower lobe on image 37 is also stable.  Pleura: Small left pleural effusion with associated pleural-based soft tissue density in the right posterior costophrenic sulcus remains stable in appearance.  Vascular/Cardiac: No evidence of thoracic aortic aneurysm or other significant abnormality.  Musculoskeletal:  No suspicious bone lesions identified.  Other:  None.  CT ABDOMEN AND PELVIS FINDINGS  Liver: Multiple hepatic cysts remain stable. No liver masses are identified.  Gallbladder/Biliary: Surgical clips from prior  cholecystectomy noted. No evidence of biliary dilatation.  Pancreas: No mass, inflammatory changes, or other parenchymal abnormality identified.  Spleen:  Within normal limits in size and appearance.  Adrenal Glands:  No mass identified.  Kidneys/Urinary Tract: No masses identified. No evidence of hydronephrosis. Mild left renal parenchymal scarring and atrophy is unchanged.  Lymph Nodes:  No pathologically enlarged lymph nodes identified.  Bowel: Diverticulosis again seen predominately involving the left colon, however there is no evidence of diverticulitis. Percutaneous gastrostomy tube remains in appropriate position.  Pelvic/Reproductive Organs: Moderately enlarged prostate gland remains stable.  Vascular:  No evidence of abdominal aortic aneurysm.  Musculoskeletal:  No suspicious bone lesions identified.  Other:  None.  IMPRESSION: No new or progressive disease identified within the chest, abdomen, or pelvis.  Stable mild mediastinal lymphadenopathy.  Stable small right pleural effusion with pleural based soft tissue density in right posterior costophrenic sulcus. Stable right lung volume loss and rounded atelectasis in the right middle and lower lobes. Stable 5 mm left lower lobe pulmonary nodule. Continued followup by CT recommended.  No evidence of abdominal or pelvic metastatic disease.   Electronically Signed   By: Earle Gell M.D.   On: 08/30/2013 16:08   Mr Lumbar Spine W Wo Contrast  08/09/2013   CLINICAL DATA:  Low back pain.  History of lung cancer.  EXAM: MRI LUMBAR SPINE WITHOUT AND WITH CONTRAST  TECHNIQUE: Multiplanar and multiecho pulse sequences of the lumbar spine were obtained without and with intravenous contrast.  CONTRAST:  5m MULTIHANCE GADOBENATE DIMEGLUMINE 529 MG/ML IV SOLN  COMPARISON:  CT scan dated 06/28/2013  FINDINGS: Conus tip is at L2. Paraspinal soft tissues demonstrate a low signal intensity 12 mm lesion in the medial aspect of the lower pole of the left kidney, probably  representing a hemorrhagic cyst. Lesion does not enhance after contrast and is not felt to represent a metastasis or a primary renal tumor.  L1-2 through L2-3. Normal discs. Minimal degenerative changes of facet joints at L2-3.  L3-4: Disc space narrowing with a small broad based disc protrusion central and to the left slightly compressing the left side of the thecal sac but without focal neural impingement.  Just below this level there are 2 tiny areas of abnormal enhancement in the nerve roots within the thecal sac. This is best seen on image 7 of series 3000 and on images 24 22, 24, and 25 of series 3300.  L4-5: Disc space narrowing. Small right far lateral disc bulge and osteophytes without neural impingement.  L5-S1: Marked disc space narrowing. No disc bulging or osteophyte formation. Previous left laminectomy. The patient has dystrophic calcifications around the the thecal sac at S1 and S2 as well as septations within the thecal sac behind the L5 vertebral body. These are felt to be scars secondary to the prior surgery.  IMPRESSION: 1. 2 tiny areas of focal nerve root enhancement behind the L4 vertebral body. This is nonspecific but given the patient's multiple malignancies, the possibility of intradural metastases must be considered. 2. Small disc protrusion at L3-4 to the left of midline without neural impingement. 3. Postsurgical scarring and calcification in and around around the thecal sac at L5-S1 extending to the S2 level.   Electronically Signed   By: JRozetta NunneryM.D.   On: 08/09/2013 11:13   Ct Abdomen Pelvis W Contrast  08/30/2013   CLINICAL DATA:  Followup metastatic non-small cell lung carcinoma. Currently undergoing chemotherapy. Personal history of  renal cell carcinoma, tongue carcinoma, and pharyngeal carcinoma.  EXAM: CT CHEST, ABDOMEN, AND PELVIS WITH CONTRAST  TECHNIQUE: Multidetector CT imaging of the chest, abdomen and pelvis was performed following the standard protocol during bolus  administration of intravenous contrast.  CONTRAST:  171m OMNIPAQUE IOHEXOL 300 MG/ML  SOLN  COMPARISON:  06/28/2013  FINDINGS: CT CHEST FINDINGS  Mediastinum/Hilar Regions: Mild right paratracheal lymphadenopathy is unchanged, with largest lymph node measuring 11 mm on image 26. No new or increased areas of lymphadenopathy identified.  Lungs: Right lung volume loss again demonstrated. Rounded subpleural areas of opacity in the right middle and lower lobes are stable, most consistent with rounded atelectasis. No new or enlarging pulmonary nodules or masses are identified. 5 mm nodular density in the left lower lobe on image 37 is also stable.  Pleura: Small left pleural effusion with associated pleural-based soft tissue density in the right posterior costophrenic sulcus remains stable in appearance.  Vascular/Cardiac: No evidence of thoracic aortic aneurysm or other significant abnormality.  Musculoskeletal:  No suspicious bone lesions identified.  Other:  None.  CT ABDOMEN AND PELVIS FINDINGS  Liver: Multiple hepatic cysts remain stable. No liver masses are identified.  Gallbladder/Biliary: Surgical clips from prior cholecystectomy noted. No evidence of biliary dilatation.  Pancreas: No mass, inflammatory changes, or other parenchymal abnormality identified.  Spleen:  Within normal limits in size and appearance.  Adrenal Glands:  No mass identified.  Kidneys/Urinary Tract: No masses identified. No evidence of hydronephrosis. Mild left renal parenchymal scarring and atrophy is unchanged.  Lymph Nodes:  No pathologically enlarged lymph nodes identified.  Bowel: Diverticulosis again seen predominately involving the left colon, however there is no evidence of diverticulitis. Percutaneous gastrostomy tube remains in appropriate position.  Pelvic/Reproductive Organs: Moderately enlarged prostate gland remains stable.  Vascular:  No evidence of abdominal aortic aneurysm.  Musculoskeletal:  No suspicious bone lesions  identified.  Other:  None.  IMPRESSION: No new or progressive disease identified within the chest, abdomen, or pelvis.  Stable mild mediastinal lymphadenopathy.  Stable small right pleural effusion with pleural based soft tissue density in right posterior costophrenic sulcus. Stable right lung volume loss and rounded atelectasis in the right middle and lower lobes. Stable 5 mm left lower lobe pulmonary nodule. Continued followup by CT recommended.  No evidence of abdominal or pelvic metastatic disease.   Electronically Signed   By: JEarle GellM.D.   On: 08/30/2013 16:08   Dg Epidurography  08/22/2013   CLINICAL DATA:  Low back and bilateral lower extremity pain. Disc protrusions most marked L3-4.  The procedure, risks, benefits, and alternatives were explained to the patient. Questions regarding the procedure were encouraged and answered. The patient understands and consents to the procedure.  PROCEDURE: LUMBAR EPIDURAL INJECTION: An interlaminar approach was performed on the right at L3-4. Operator donned sterile gloves and mask. The overlying skin was cleansed and anesthetized. A 20 gauge Crawford epidural needle was advanced using loss-of-resistance technique.  DIAGNOSTIC EPIDURAL INJECTION: Injection of Omnipaque 180 shows a good epidural pattern with spread above and below the level of needle placement. No intrathecal or vascular opacification is seen.  THERAPEUTIC EPIDURAL INJECTION: 1212mof Depo-Medrol mixed with 74m374midocaine 1% were instilled. The procedure was well-tolerated, and the patient was discharged thirty minutes following the injection in good condition.  FLUOROSCOPY TIME:  21 seconds  COMPLICATIONS: None  IMPRESSION: Technically successful epidural injection on the right at L3-4.   Electronically Signed   By: DanDelories Heinz  On: 08/22/2013 14:01   ASSESSMENT AND PLAN: This is is a 79 years old white male who was diagnosed with extensive stage small cell lung cancer status post  systemic chemotherapy with carboplatin and etoposide with progressive disease on the right side of the chest. He was seen at [facility] by Dr. Sharlet Hurley and was considered for a clinical trial with Ipilumomab and Nivolumab.  Recent repeat biopsy of a right pleural based nodule showed poorly differentiated non-small cell carcinoma. He is currently undergoing systemic chemotherapy with carboplatin and Abraxane status post 8 cycles. He tolerated the chemotherapy fairly well except for increasing fatigue secondary to chemotherapy-induced anemia as well as thrombocytopenia. Patient was discussed with and also seen by Dr. Julien Nordmann. To review the results of his CT scan which revealed stable disease within the chest and no evidence of abdominal or pelvic metastatic disease. His hemoglobin is a bit low at 8.2 and he is symptomatic from this. We will arrange to transfuse him 1 unit of packed red blood cells today. We will close cone starting cycle #9 of his systemic chemotherapy with carboplatin and Abraxane secondary to his platelet count being slightly low at 94,000. It is hoped that the one-week break will allow his platelet count to rise to a therapeutic range. Both patient and his wife are in agreement with this plan. He was given a refill for his oxycodone tablets, 5 mg tablets, one to 2 tablets by mouth every 6 hours total of 90 tablets he averages 6 tablets daily. He is to followup with Dr. Merrilee Seashore as well as [facility] neurology as scheduled regarding his back pain. She will continue with labs and chemotherapy as scheduled and followup in 4 weeks prior to the start of cycle #10.  For the peripheral neuropathy, he will continue on reduced dose of Abraxane to 80 mg/M2 and the increased  Neurontin dose of 300 mg by mouth 3 times a day. He was advised to call immediately if he has any concerning symptoms in the interval. The patient voices understanding of current disease status and  treatment options and is in agreement with the current care plan.  All questions were answered. The patient knows to call the clinic with any problems, questions or concerns. We can certainly see the patient much sooner if necessary.  Disclaimer: This note was dictated with voice recognition software. Similar sounding words can inadvertently be transcribed and may not be corrected upon review.  Carlton Adam, PA-C 09/01/2013  ADDENDUM: Hematology/Oncology Attending:  I had a face to face encounter with the patient. I recommended his care plan.  This is a very pleasant 79 years old white male with history of extensive stage small cell lung cancer as well as recent diagnosis last year or non-small cell lung cancer, poorly differentiated and currently on treatment with carboplatin and Abraxane status post 8 cycles. The patient has been tolerating his treatment fairly well with no significant adverse effect except for chemotherapy-induced anemia and he requires PRBCs transfusion intermittently during his treatment.  He also continues to complain of low back pain and he is currently under the treatment of Dr. Louanne Skye. He was treated with local steroid injection with no improvement. He was also referred to a neurologist for evaluation of his persistent pain and peripheral neuropathy. His last CT scan of the chest, abdomen and pelvis showed stable disease. I discussed the scan results with the patient and his wife and give him the option of observation versus continuing treatment  with the same regimen. The patient and his wife would like to continue treatment with chemotherapy with the same regimen. Will start cycle #9 next week because of the low platelets count today. We will arrange for the patient to receive 1 units of PRBCs transfusion today because of the chemotherapy-induced anemia. He would come back for followup visit in 3 weeks with the start of cycle #10 He was given a refill of his pain  medication.  He was advised to call immediately if he has any concerning symptoms in the interval.  Disclaimer: This note was dictated with voice recognition software. Similar sounding words can inadvertently be transcribed and may be missed upon review. Eilleen Kempf., MD 09/04/2013

## 2013-09-01 NOTE — Patient Instructions (Signed)

## 2013-09-01 NOTE — Patient Instructions (Signed)

## 2013-09-01 NOTE — Telephone Encounter (Signed)
Per staff message and POF I have scheduled appts. Advised scheduler of appts. JMW  

## 2013-09-01 NOTE — H&P (Signed)
  Office note for 07-21-2013 from [facility] will serve as H&P for this patients outpatient MRI scan under anesthesia.

## 2013-09-01 NOTE — Telephone Encounter (Signed)
, °

## 2013-09-02 LAB — TYPE AND SCREEN
ABO/RH(D): A POS
ANTIBODY SCREEN: NEGATIVE
Unit division: 0

## 2013-09-02 NOTE — Patient Instructions (Signed)
Your restaging CT scan revealed stable disease within your chest and no evidence of any metastatic disease within the abdomen or pelvis We are postponing the start of your next cycle of chemotherapy when we do to your slightly low platelet count. He'll receive one unit of blood to address your chemotherapy-induced anemia today. Continue labs and chemotherapy as scheduled and followup in 4 weeks at the start of your next scheduled cycle of chemotherapy

## 2013-09-06 ENCOUNTER — Ambulatory Visit (INDEPENDENT_AMBULATORY_CARE_PROVIDER_SITE_OTHER): Payer: Commercial Managed Care - HMO | Admitting: Diagnostic Neuroimaging

## 2013-09-06 ENCOUNTER — Encounter: Payer: Self-pay | Admitting: Diagnostic Neuroimaging

## 2013-09-06 VITALS — BP 125/67 | HR 66 | Temp 99.0°F | Ht 72.0 in | Wt 169.4 lb

## 2013-09-06 DIAGNOSIS — M79609 Pain in unspecified limb: Secondary | ICD-10-CM

## 2013-09-06 DIAGNOSIS — G620 Drug-induced polyneuropathy: Secondary | ICD-10-CM

## 2013-09-06 DIAGNOSIS — T451X5A Adverse effect of antineoplastic and immunosuppressive drugs, initial encounter: Secondary | ICD-10-CM

## 2013-09-06 DIAGNOSIS — M79606 Pain in leg, unspecified: Secondary | ICD-10-CM

## 2013-09-06 NOTE — Progress Notes (Signed)
[facility]  PATIENT: Samuel Hurley DOB: 16-Jul-1934  REFERRING CLINICIAN: Louanne Skye HISTORY FROM: patient and wife  REASON FOR VISIT: new consult    HISTORICAL  CHIEF COMPLAINT:  Chief Complaint  Patient presents with  . Neurologic Problem    severe back pain    HISTORY OF PRESENT ILLNESS:  79 year old male with squamous cell carcinoma of the tongue in 2007, oncocytic renal tumor 2008, small cell lung cancer 2014, poorly differentiated non-small cell lung cancer 2014, status post multiple surgeries, chemotherapy, radiation, here for evaluation of low back pain.   Patient reports history of lumbar spine surgery 30-40 years ago, with good relief of symptoms. Patient did not have any significant low back pain problems until approximately 6 months ago. Patient has gradual onset progressive pain in his hips, buttocks, radiating to his ankles. He describes a sensation like his legs are on "fire". Symptoms are worse when he is standing or walking. Symptoms are relieved when he sits down or lays down.  When patient began chemotherapy last year, he did develop some numbness and tingling in his toes and was started on gabapentin for neuropathy.  Patient had MRI of the lumbar spine recently which I reviewed. There are 2 small nonspecific enhancing nodules in the cauda equina which raise the possibility of metastatic deposits. Also there is extensive cyst and septation posterior to L5 S1 which may represent postsurgical cyst versus Tarlov cyst.  Patient currently on oxycodone, fentanyl, gabapentin without significant relief.   ONCOLOGY HISTORY (per Dr. Worthy Flank note 08/11/13): "DIAGNOSIS AND STAGE:  1) Poorly differentiated non-small cell lung cancer diagnosed in December of 2014  2) Extensive stage small cell lung cancer diagnosed in July 2014.  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  4) History of oncocytic renal tumor status post radiofrequency ablation at [facility] in August of 2008.   PRIOR THERAPY: Systemic chemotherapy with carboplatin for AUC of 5 on day 1 and etoposide 120 mg/M2 on days 1, 2 and 3 with Neulasta support on day 4, status post 4 cycles. First dose was given on 08/18/2012.   CURRENT THERAPY: Systemic chemotherapy with carboplatin for AUC of 5 on day 1 and Abraxane 80 mg/M2 on days 1, 8 and 15 every 3 weeks, status post 5 cycles."   REVIEW OF SYSTEMS: Full 14 system review of systems performed and notable only for anxiety too much sleep decreased energy numbness weakness difficulty swallowing anemia feeling cold shortness of breath constipation impotence trouble swallowing fatigue.  ALLERGIES: Allergies  Allergen Reactions  . Erythromycin Diarrhea  . Tetracycline Diarrhea    HOME MEDICATIONS: Outpatient Prescriptions Prior to Visit  Medication Sig Dispense Refill  . docusate sodium (COLACE) 100 MG capsule Take 1 capsule (100 mg total) by mouth 2 (two) times daily.  100 capsule  prn  . finasteride (PROSCAR) 5 MG tablet Take 1 tablet (5 mg total) by mouth at bedtime.  30 tablet  3  . gabapentin (NEURONTIN) 300 MG capsule Take 1 capsule (300 mg total) by mouth 3 (three) times daily.  60 capsule  0  . levothyroxine (SYNTHROID, LEVOTHROID) 88 MCG tablet Take 1 tablet (88 mcg total) by mouth daily before breakfast.  30 tablet  3  . magnesium citrate SOLN Take 1 Bottle by mouth as needed  for severe constipation.      . mirtazapine (REMERON) 30 MG tablet TAKE ONE TABLET AT BEDTIME.  30 tablet  1  . mometasone (NASONEX) 50 MCG/ACT nasal spray Place 2 sprays into the nose daily.      Marland Kitchen NUTRITIONAL SUPPLEMENT LIQD by PEG Tube route. Nutren 2.0.   2- 500 calorie bottles daily      . oxaprozin (DAYPRO) 600 MG tablet Take 600 mg  by mouth 2 (two) times daily.      . fentaNYL (DURAGESIC - DOSED MCG/HR) 50 MCG/HR Place 100 mcg onto the skin every 3 (three) days.      Marland Kitchen LORazepam (ATIVAN) 1 MG tablet Take 1 tablet (1 mg total) by mouth every 8 (eight) hours as needed for anxiety.  30 tablet  0  . ondansetron (ZOFRAN) 8 MG tablet Take 8 mg by mouth every 8 (eight) hours as needed for nausea or vomiting.       Marland Kitchen oxyCODONE (OXY IR/ROXICODONE) 5 MG immediate release tablet Take 1 tablet (5 mg total) by mouth every 4 (four) hours as needed for moderate pain or severe pain.  90 tablet  0  . terazosin (HYTRIN) 1 MG capsule Take 1 mg by mouth 2 (two) times daily.       Marland Kitchen tiZANidine (ZANAFLEX) 4 MG capsule Take 4 mg by mouth 2 (two) times daily.       Facility-Administered Medications Prior to Visit  Medication Dose Route Frequency Provider Last Rate Last Dose  . lactated ringers infusion    Continuous PRN Jenne Campus, CRNA      . midazolam (VERSED) injection    Anesthesia [place], CRNA   1 mg at 08/09/13 0855  . ondansetron [facility]) injection    Anesthesia Intra-op Jenne Campus, CRNA   4 mg at 08/09/13 0845  . propofol (DIPRIVAN) 10 mg/mL bolus/IV push    Anesthesia Intra-op Jenne Campus, CRNA   10 mg at 08/09/13 1000  . propofol (DIPRIVAN) infusion 10 mg/ml EMUL    Continuous PRN Jenne Campus, CRNA   20 mcg/kg/min at 08/09/13 3559    PAST MEDICAL HISTORY: Past Medical History  Diagnosis Date  . Hyperlipidemia   . BPH (benign prostatic hypertrophy)   . Xerostomia   . Colon polyp     HYPERPLASTIC & TUBULAR ADENOMA(Colonoscopy-Dr.Bentley)  . Diverticulosis of colon (without mention of hemorrhage)     (Colonoscopy-Dr.[place])  . Internal hemorrhoids without mention of complication     (Colonoscopy-Dr.Hamilton)  . GERD (gastroesophageal reflux disease)     (EGD-Dr. Velora Heckler)  . Atrophic gastritis without mention of hemorrhage     (EGD-Dr. Velora Heckler)  . History of radiation therapy   . Pharynx  cancer     squamous cell stage 4,s/p XRT,chemo, neck dissection  . Small cell lung cancer   . Non-small cell lung cancer   . Heart murmur   . OSA (obstructive sleep apnea)     does not use cpap dx 2005  . Shortness of breath   . Asthma     borderline  . Pneumonia   . Hypothyroidism   . Headache(784.0)   . Anemia   . Kidney disease     TCC  . Complication of anesthesia     "severe claustrophbia"  . Claustrophobia     "severe"    PAST SURGICAL HISTORY: Past Surgical History  Procedure Laterality Date  . Appendectomy    . Lumbar laminectomy    . Tonsillectomy    .  Bilateral ganglionectomies Left   . Radical right neck dissection  2008  . Pci-rfa renal cell (tcc) cancer  2009    pt denies  . Cholecystectomy  dec. 2009  . Septoplasty  03/01/12    with double turbinectomy  . Video bronchoscopy Bilateral 08/10/2012    Procedure: VIDEO BRONCHOSCOPY WITH FLUORO;  Surgeon: Rigoberto Noel, MD;  Location: [facility];  Service: Cardiopulmonary;  Laterality: Bilateral;  . Shoulder arthroscopy Left 2013  . Thoracentesis Right 10/2012  . Radiology with anesthesia N/A 07/07/2013    Procedure: MRI OF PELVIC;  Surgeon: Medication Radiologist, MD;  Location: [facility];  Service: Radiology;  Laterality: N/A;  DR. MOHAMED/MRI  . Radiology with anesthesia N/A 08/09/2013    Procedure: MRI LUMBER SPINE WITHOUT CONTRAST;  Surgeon: Medication Radiologist, MD;  Location: [facility];  Service: Radiology;  Laterality: N/A;    FAMILY HISTORY: Family History  Problem Relation Age of Onset  . Heart failure Father 43    Deceased  . Kidney failure Father   . Congestive Heart Failure Father   . Heart failure Mother 56    Deceased  . Kidney failure Mother   . Colon cancer Neg Hx   . Prostate cancer Neg Hx   . Diabetes Neg Hx   . Healthy Son   . Healthy Daughter     SOCIAL HISTORY:  History   Social History  . Marital Status: Married    Spouse Name: Mikel    Number of Children: 2  . Years of  Education: College   Occupational History  . minister Other   Social History Main Topics  . Smoking status: Never Smoker   . Smokeless tobacco: Never Used  . Alcohol Use: No  . Drug Use: No  . Sexual Activity: No   Other Topics Concern  . Not on file   Social History Narrative   Father deceased @ 89,Mother deceased @ 80. [place];[employer]. Married -62-17 yrs./divorced;married '83. 1 son-'63;1 daughter '64; 1 stepson '66; 1 grandchild.      [place] [address]-times this past week.        Patient lives at home with his spouse.   Caffeine Use: 2-3 cups daily              PHYSICAL EXAM  Filed Vitals:   09/06/13 1036  BP: 125/67  Pulse: 66  Temp: 99 F (37.2 C)  TempSrc: Oral  Height: 6' (1.829 m)  Weight: 169 lb 6.4 oz (76.839 kg)    Not recorded    Body mass index is 22.97 kg/(m^2).  GENERAL EXAM: Patient is in no distress; well developed, nourished and groomed; neck is supple  CARDIOVASCULAR: Regular rate and rhythm, no murmurs, no carotid bruits  NEUROLOGIC: MENTAL STATUS: awake, alert, oriented to person, place and time, recent and remote memory intact, normal attention and concentration, language fluent, comprehension intact, naming intact, fund of knowledge appropriate CRANIAL NERVE: no papilledema on fundoscopic exam, pupils equal and reactive to light, visual fields full to confrontation, extraocular muscles intact, no nystagmus, facial sensation and strength symmetric, hearing intact, palate elevates symmetrically, uvula midline, shoulder shrug symmetric, tongue midline. MOTOR: normal bulk and tone, BUE (DELTOID 4+, OTHERWISE 5), BLE (HF 4, KE/KF 4, DF 4+, PF 4). DIFF WITH WALKIGN ON TOES AND HEELS  SENSORY: normal and symmetric to light touch; DECR PP AND VIB AT TOES COORDINATION: finger-nose-finger, fine finger movements normal REFLEXES: BUE TRACE, BLE ABSENT GAIT/STATION: narrow based gait; STIFF POSTURE; CANNOT  TANDEM, TOE OR HEEL  WALK; ROMBERG NEG    DIAGNOSTIC DATA (LABS, IMAGING, TESTING) - I reviewed patient records, labs, notes, testing and imaging myself where available.  Lab Results  Component Value Date   WBC 5.3 09/01/2013   HGB 8.2* 09/01/2013   HCT 25.2* 09/01/2013   MCV 98.6* 09/01/2013   PLT 94* 09/01/2013      Component Value Date/Time   NA 142 09/01/2013 1101   NA 131* 04/05/2013 0545   K 3.8 09/01/2013 1101   K 3.8 04/05/2013 0545   CL 93* 04/05/2013 0545   CO2 28 09/01/2013 1101   CO2 27 04/05/2013 0545   GLUCOSE 98 09/01/2013 1101   GLUCOSE 111* 04/05/2013 0545   BUN 22.6 09/01/2013 1101   BUN 8 04/05/2013 0545   CREATININE 1.2 09/01/2013 1101   CREATININE 0.79 04/05/2013 0545   CALCIUM 8.9 09/01/2013 1101   CALCIUM 8.3* 04/05/2013 0545   PROT 6.3* 09/01/2013 1101   PROT 7.1 05/13/2012 1005   ALBUMIN 2.5* 09/01/2013 1101   ALBUMIN 3.4* 05/13/2012 1005   AST 17 09/01/2013 1101   AST 16 05/13/2012 1005   ALT 8 09/01/2013 1101   ALT 11 05/13/2012 1005   ALKPHOS 57 09/01/2013 1101   ALKPHOS 76 05/13/2012 1005   BILITOT <0.20 09/01/2013 1101   BILITOT 0.5 05/13/2012 1005   GFRNONAA 85* 04/05/2013 0545   GFRAA >90 04/05/2013 0545   No results found for this basename: CHOL, HDL, LDLCALC, LDLDIRECT, TRIG, CHOLHDL   No results found for this basename: HGBA1C   Lab Results  Component Value Date   VITAMINB12 1531* 04/02/2013   Lab Results  Component Value Date   TSH 13.837* 05/19/2013    I reviewed images myself and agree with interpretation. Also there is multi-septated cyst formation at L5-S1 to S2 level (tarlov cysts vs post-surgical cysts) with displacement of nerve roots posteriorly. -VRP  MRI LUMBAR 1. 2 tiny areas of focal nerve root enhancement behind the L4 vertebral body. This is nonspecific but given the patient's multiple malignancies, the possibility of intradural metastases must be considered.  2. Small disc protrusion at L3-4 to the left of midline without neural impingement.  3. Postsurgical  scarring and calcification in and around around the thecal sac at L5-S1 extending to the S2 level.   ASSESSMENT AND PLAN  79 y.o. year old male here with history of multiple cancers of the tongue, kidneys and lung, status post radiation and chemotherapy, with significant low back pain over the past 6 months.   Ddx of back/buttock/leg pain:  potential metastatic deposits in the cauda equina, postsurgical cyst versus Tarlov cyst in the L5-S1 region, chemotherapy neuropathy; I do not think pain is arising from the L3-4 level.   PLAN: - consider CSF analysis to look for evidence of metastatic spread to lumbar nerve roots; would discuss with Dr. Julien Nordmann to see if detection of CSF malignant cells would change cancer treatment before pursing this. - consider myelogram to evaluate for spinal stenosis due to cyst formation at L5-S1 to S2 levels; would discuss with Dr. Louanne Skye to see if this would change potential surgical mgmt options - consider increasing gabapentin to 600mg  TID; will defer pain mgmt to oncology, orthopedic surgery or pain mgmt clinic  Return if symptoms worsen or fail to improve.    Penni Bombard, MD 2/53/6644, 03:47 PM Certified in Neurology, Neurophysiology and Neuroimaging  [facility] [address], [place], [place] [address] [phone]

## 2013-09-06 NOTE — Patient Instructions (Signed)
Follow up with Dr. Julien Nordmann and Dr. Louanne Skye.

## 2013-09-08 ENCOUNTER — Other Ambulatory Visit: Payer: Commercial Managed Care - HMO

## 2013-09-08 ENCOUNTER — Ambulatory Visit: Payer: Commercial Managed Care - HMO

## 2013-09-08 ENCOUNTER — Ambulatory Visit (HOSPITAL_BASED_OUTPATIENT_CLINIC_OR_DEPARTMENT_OTHER): Payer: Commercial Managed Care - HMO

## 2013-09-08 ENCOUNTER — Other Ambulatory Visit (HOSPITAL_BASED_OUTPATIENT_CLINIC_OR_DEPARTMENT_OTHER): Payer: Commercial Managed Care - HMO

## 2013-09-08 VITALS — BP 135/57 | HR 65 | Temp 98.1°F

## 2013-09-08 DIAGNOSIS — Z5111 Encounter for antineoplastic chemotherapy: Secondary | ICD-10-CM

## 2013-09-08 DIAGNOSIS — C3491 Malignant neoplasm of unspecified part of right bronchus or lung: Secondary | ICD-10-CM

## 2013-09-08 DIAGNOSIS — Z95828 Presence of other vascular implants and grafts: Secondary | ICD-10-CM

## 2013-09-08 DIAGNOSIS — C782 Secondary malignant neoplasm of pleura: Secondary | ICD-10-CM

## 2013-09-08 DIAGNOSIS — C342 Malignant neoplasm of middle lobe, bronchus or lung: Secondary | ICD-10-CM

## 2013-09-08 LAB — COMPREHENSIVE METABOLIC PANEL (CC13)
ALK PHOS: 62 U/L (ref 40–150)
ALT: 6 U/L (ref 0–55)
AST: 13 U/L (ref 5–34)
Albumin: 2.4 g/dL — ABNORMAL LOW (ref 3.5–5.0)
Anion Gap: 9 mEq/L (ref 3–11)
BUN: 16.5 mg/dL (ref 7.0–26.0)
CO2: 26 mEq/L (ref 22–29)
CREATININE: 1.2 mg/dL (ref 0.7–1.3)
Calcium: 9 mg/dL (ref 8.4–10.4)
Chloride: 105 mEq/L (ref 98–109)
GLUCOSE: 105 mg/dL (ref 70–140)
Potassium: 3.8 mEq/L (ref 3.5–5.1)
Sodium: 140 mEq/L (ref 136–145)
Total Bilirubin: 0.26 mg/dL (ref 0.20–1.20)
Total Protein: 6.4 g/dL (ref 6.4–8.3)

## 2013-09-08 LAB — CBC WITH DIFFERENTIAL/PLATELET
BASO%: 0.4 % (ref 0.0–2.0)
Basophils Absolute: 0 10*3/uL (ref 0.0–0.1)
EOS ABS: 0 10*3/uL (ref 0.0–0.5)
EOS%: 0.8 % (ref 0.0–7.0)
HEMATOCRIT: 28 % — AB (ref 38.4–49.9)
HEMOGLOBIN: 9 g/dL — AB (ref 13.0–17.1)
LYMPH%: 9.4 % — AB (ref 14.0–49.0)
MCH: 31.3 pg (ref 27.2–33.4)
MCHC: 32.1 g/dL (ref 32.0–36.0)
MCV: 97.2 fL (ref 79.3–98.0)
MONO#: 0.6 10*3/uL (ref 0.1–0.9)
MONO%: 11.7 % (ref 0.0–14.0)
NEUT#: 3.8 10*3/uL (ref 1.5–6.5)
NEUT%: 77.7 % — AB (ref 39.0–75.0)
PLATELETS: 98 10*3/uL — AB (ref 140–400)
RBC: 2.88 10*6/uL — ABNORMAL LOW (ref 4.20–5.82)
RDW: 19.9 % — ABNORMAL HIGH (ref 11.0–14.6)
WBC: 4.9 10*3/uL (ref 4.0–10.3)
lymph#: 0.5 10*3/uL — ABNORMAL LOW (ref 0.9–3.3)

## 2013-09-08 MED ORDER — DEXAMETHASONE SODIUM PHOSPHATE 20 MG/5ML IJ SOLN
20.0000 mg | Freq: Once | INTRAMUSCULAR | Status: AC
  Administered 2013-09-08: 20 mg via INTRAVENOUS

## 2013-09-08 MED ORDER — ONDANSETRON 16 MG/50ML IVPB (CHCC)
16.0000 mg | Freq: Once | INTRAVENOUS | Status: AC
  Administered 2013-09-08: 16 mg via INTRAVENOUS

## 2013-09-08 MED ORDER — CARBOPLATIN CHEMO INJECTION 600 MG/60ML
400.0000 mg | Freq: Once | INTRAVENOUS | Status: AC
  Administered 2013-09-08: 400 mg via INTRAVENOUS
  Filled 2013-09-08: qty 40

## 2013-09-08 MED ORDER — SODIUM CHLORIDE 0.9 % IJ SOLN
10.0000 mL | INTRAMUSCULAR | Status: DC | PRN
  Administered 2013-09-08: 10 mL via INTRAVENOUS
  Filled 2013-09-08: qty 10

## 2013-09-08 MED ORDER — SODIUM CHLORIDE 0.9 % IV SOLN
Freq: Once | INTRAVENOUS | Status: AC
  Administered 2013-09-08: 14:00:00 via INTRAVENOUS

## 2013-09-08 MED ORDER — CARBOPLATIN CHEMO INTRADERMAL TEST DOSE 100MCG/0.02ML
100.0000 ug | Freq: Once | INTRADERMAL | Status: AC
  Administered 2013-09-08: 100 ug via INTRADERMAL
  Filled 2013-09-08: qty 0.01

## 2013-09-08 MED ORDER — HEPARIN SOD (PORK) LOCK FLUSH 100 UNIT/ML IV SOLN
500.0000 [IU] | Freq: Once | INTRAVENOUS | Status: AC
  Administered 2013-09-08: 500 [IU] via INTRAVENOUS
  Filled 2013-09-08: qty 5

## 2013-09-08 MED ORDER — PACLITAXEL PROTEIN-BOUND CHEMO INJECTION 100 MG
80.0000 mg/m2 | Freq: Once | INTRAVENOUS | Status: AC
  Administered 2013-09-08: 150 mg via INTRAVENOUS
  Filled 2013-09-08: qty 30

## 2013-09-08 MED ORDER — ONDANSETRON 16 MG/50ML IVPB (CHCC)
INTRAVENOUS | Status: AC
  Filled 2013-09-08: qty 16

## 2013-09-08 MED ORDER — DEXAMETHASONE SODIUM PHOSPHATE 20 MG/5ML IJ SOLN
INTRAMUSCULAR | Status: AC
  Filled 2013-09-08: qty 5

## 2013-09-08 MED ORDER — ONDANSETRON 8 MG/NS 50 ML IVPB
INTRAVENOUS | Status: AC
  Filled 2013-09-08: qty 8

## 2013-09-08 MED ORDER — SODIUM CHLORIDE 0.9 % IJ SOLN
10.0000 mL | INTRAMUSCULAR | Status: DC | PRN
  Administered 2013-09-08: 10 mL
  Filled 2013-09-08: qty 10

## 2013-09-08 NOTE — Patient Instructions (Signed)
[facility] Discharge Instructions for Patients Receiving Chemotherapy  Today you received the following chemotherapy agents Carboplatin/Abraxane  To help prevent nausea and vomiting after your treatment, we encourage you to take your nausea medication   If you develop nausea and vomiting that is not controlled by your nausea medication, call the clinic.   BELOW ARE SYMPTOMS THAT SHOULD BE REPORTED IMMEDIATELY:  *FEVER GREATER THAN 100.5 F  *CHILLS WITH OR WITHOUT FEVER  NAUSEA AND VOMITING THAT IS NOT CONTROLLED WITH YOUR NAUSEA MEDICATION  *UNUSUAL SHORTNESS OF BREATH  *UNUSUAL BRUISING OR BLEEDING  TENDERNESS IN MOUTH AND THROAT WITH OR WITHOUT PRESENCE OF ULCERS  *URINARY PROBLEMS  *BOWEL PROBLEMS  UNUSUAL RASH Items with * indicate a potential emergency and should be followed up as soon as possible.  Feel free to call the clinic you have any questions or concerns. The clinic phone number is [phone].

## 2013-09-08 NOTE — Patient Instructions (Signed)

## 2013-09-08 NOTE — Progress Notes (Signed)
Okay to treat today, despite labs, per Dr. Julien Nordmann.  Carboplatin Test Dose administered via SQ with negative results; no signs of redness or swelling noted at site, (Left lower inner forearm); patient tolerated well.

## 2013-09-15 ENCOUNTER — Other Ambulatory Visit: Payer: Commercial Managed Care - HMO

## 2013-09-15 ENCOUNTER — Telehealth: Payer: Self-pay

## 2013-09-15 ENCOUNTER — Ambulatory Visit (HOSPITAL_BASED_OUTPATIENT_CLINIC_OR_DEPARTMENT_OTHER): Payer: Commercial Managed Care - HMO

## 2013-09-15 ENCOUNTER — Other Ambulatory Visit (HOSPITAL_BASED_OUTPATIENT_CLINIC_OR_DEPARTMENT_OTHER): Payer: Commercial Managed Care - HMO

## 2013-09-15 ENCOUNTER — Ambulatory Visit: Payer: Commercial Managed Care - HMO

## 2013-09-15 VITALS — BP 116/57 | HR 70 | Temp 99.1°F

## 2013-09-15 DIAGNOSIS — Z95828 Presence of other vascular implants and grafts: Secondary | ICD-10-CM

## 2013-09-15 DIAGNOSIS — E039 Hypothyroidism, unspecified: Secondary | ICD-10-CM

## 2013-09-15 DIAGNOSIS — C3491 Malignant neoplasm of unspecified part of right bronchus or lung: Secondary | ICD-10-CM

## 2013-09-15 DIAGNOSIS — C342 Malignant neoplasm of middle lobe, bronchus or lung: Secondary | ICD-10-CM

## 2013-09-15 DIAGNOSIS — C782 Secondary malignant neoplasm of pleura: Secondary | ICD-10-CM

## 2013-09-15 DIAGNOSIS — Z5111 Encounter for antineoplastic chemotherapy: Secondary | ICD-10-CM

## 2013-09-15 LAB — CBC WITH DIFFERENTIAL/PLATELET
BASO%: 0.8 % (ref 0.0–2.0)
Basophils Absolute: 0 10*3/uL (ref 0.0–0.1)
EOS%: 1.2 % (ref 0.0–7.0)
Eosinophils Absolute: 0 10*3/uL (ref 0.0–0.5)
HCT: 25.7 % — ABNORMAL LOW (ref 38.4–49.9)
HGB: 8.5 g/dL — ABNORMAL LOW (ref 13.0–17.1)
LYMPH#: 0.4 10*3/uL — AB (ref 0.9–3.3)
LYMPH%: 12.1 % — ABNORMAL LOW (ref 14.0–49.0)
MCH: 31.8 pg (ref 27.2–33.4)
MCHC: 32.9 g/dL (ref 32.0–36.0)
MCV: 96.7 fL (ref 79.3–98.0)
MONO#: 0.3 10*3/uL (ref 0.1–0.9)
MONO%: 9 % (ref 0.0–14.0)
NEUT#: 2.7 10*3/uL (ref 1.5–6.5)
NEUT%: 76.9 % — ABNORMAL HIGH (ref 39.0–75.0)
Platelets: 103 10*3/uL — ABNORMAL LOW (ref 140–400)
RBC: 2.66 10*6/uL — AB (ref 4.20–5.82)
RDW: 22.5 % — AB (ref 11.0–14.6)
WBC: 3.5 10*3/uL — ABNORMAL LOW (ref 4.0–10.3)

## 2013-09-15 LAB — COMPREHENSIVE METABOLIC PANEL (CC13)
ALT: 7 U/L (ref 0–55)
AST: 15 U/L (ref 5–34)
Albumin: 2.4 g/dL — ABNORMAL LOW (ref 3.5–5.0)
Alkaline Phosphatase: 59 U/L (ref 40–150)
Anion Gap: 7 mEq/L (ref 3–11)
BUN: 18.2 mg/dL (ref 7.0–26.0)
CALCIUM: 8.5 mg/dL (ref 8.4–10.4)
CO2: 27 mEq/L (ref 22–29)
CREATININE: 1.1 mg/dL (ref 0.7–1.3)
Chloride: 108 mEq/L (ref 98–109)
Glucose: 97 mg/dl (ref 70–140)
Potassium: 3.5 mEq/L (ref 3.5–5.1)
Sodium: 142 mEq/L (ref 136–145)
Total Bilirubin: 0.34 mg/dL (ref 0.20–1.20)
Total Protein: 6.3 g/dL — ABNORMAL LOW (ref 6.4–8.3)

## 2013-09-15 MED ORDER — ONDANSETRON 8 MG/NS 50 ML IVPB
INTRAVENOUS | Status: AC
  Filled 2013-09-15: qty 8

## 2013-09-15 MED ORDER — SODIUM CHLORIDE 0.9 % IV SOLN
Freq: Once | INTRAVENOUS | Status: AC
  Administered 2013-09-15: 14:00:00 via INTRAVENOUS

## 2013-09-15 MED ORDER — SODIUM CHLORIDE 0.9 % IJ SOLN
10.0000 mL | INTRAMUSCULAR | Status: DC | PRN
  Administered 2013-09-15: 10 mL via INTRAVENOUS
  Filled 2013-09-15: qty 10

## 2013-09-15 MED ORDER — SODIUM CHLORIDE 0.9 % IJ SOLN
10.0000 mL | INTRAMUSCULAR | Status: DC | PRN
  Administered 2013-09-15: 10 mL
  Filled 2013-09-15: qty 10

## 2013-09-15 MED ORDER — HEPARIN SOD (PORK) LOCK FLUSH 100 UNIT/ML IV SOLN
500.0000 [IU] | Freq: Once | INTRAVENOUS | Status: AC | PRN
  Administered 2013-09-15: 500 [IU]
  Filled 2013-09-15: qty 5

## 2013-09-15 MED ORDER — ONDANSETRON 8 MG/50ML IVPB (CHCC)
8.0000 mg | Freq: Once | INTRAVENOUS | Status: AC
  Administered 2013-09-15: 8 mg via INTRAVENOUS

## 2013-09-15 MED ORDER — PACLITAXEL PROTEIN-BOUND CHEMO INJECTION 100 MG
80.0000 mg/m2 | Freq: Once | INTRAVENOUS | Status: AC
  Administered 2013-09-15: 150 mg via INTRAVENOUS
  Filled 2013-09-15: qty 30

## 2013-09-15 MED ORDER — DEXAMETHASONE SODIUM PHOSPHATE 10 MG/ML IJ SOLN
INTRAMUSCULAR | Status: AC
  Filled 2013-09-15: qty 1

## 2013-09-15 MED ORDER — DEXAMETHASONE SODIUM PHOSPHATE 10 MG/ML IJ SOLN
10.0000 mg | Freq: Once | INTRAMUSCULAR | Status: AC
  Administered 2013-09-15: 10 mg via INTRAVENOUS

## 2013-09-15 NOTE — Telephone Encounter (Signed)
Patient's TSH level was abnormal.  His Levothyroxine dose was increased at his visit.  He was due to come back in 4 weeks to have his TSH repeated to verify current dose is appropriate.  Once we have found the best dose, I will send in 90 tablets.  I was trying to prevent him from having so many unusable pills if frequent dose changes have to be made.  Will send in refill for 30 tablets until patient comes in for repeat TSH levels.  Which pharmacy would he like it to go to.

## 2013-09-15 NOTE — Patient Instructions (Signed)

## 2013-09-15 NOTE — Telephone Encounter (Signed)
Someone (didn't stated name) stated that "pt was prescribed Levothyroxine 30 tabs. They stated pt needs 90 tabs and it needs to go to [facility]. LDM"

## 2013-09-15 NOTE — Patient Instructions (Signed)
[facility] Discharge Instructions for Patients Receiving Chemotherapy  Today you received the following chemotherapy agents :  Abraxane.  To help prevent nausea and vomiting after your treatment, we encourage you to take your nausea medication as prescribed by your physician.   If you develop nausea and vomiting that is not controlled by your nausea medication, call the clinic.   BELOW ARE SYMPTOMS THAT SHOULD BE REPORTED IMMEDIATELY:  *FEVER GREATER THAN 100.5 F  *CHILLS WITH OR WITHOUT FEVER  NAUSEA AND VOMITING THAT IS NOT CONTROLLED WITH YOUR NAUSEA MEDICATION  *UNUSUAL SHORTNESS OF BREATH  *UNUSUAL BRUISING OR BLEEDING  TENDERNESS IN MOUTH AND THROAT WITH OR WITHOUT PRESENCE OF ULCERS  *URINARY PROBLEMS  *BOWEL PROBLEMS  UNUSUAL RASH Items with * indicate a potential emergency and should be followed up as soon as possible.  Feel free to call the clinic you have any questions or concerns. The clinic phone number is [phone].

## 2013-09-15 NOTE — Progress Notes (Signed)
Dr. Julien Nordmann notified of lab results today.  Proceed with chemo as per md.

## 2013-09-16 ENCOUNTER — Other Ambulatory Visit: Payer: Self-pay | Admitting: Medical Oncology

## 2013-09-16 ENCOUNTER — Other Ambulatory Visit: Payer: Self-pay | Admitting: *Deleted

## 2013-09-16 DIAGNOSIS — F411 Generalized anxiety disorder: Secondary | ICD-10-CM

## 2013-09-16 NOTE — Telephone Encounter (Signed)
I asked pharmacist to fax hard copy for ativan rx refill.

## 2013-09-16 NOTE — Telephone Encounter (Signed)
NO REFILL AUTHORIZED AT THIS TIME.

## 2013-09-16 NOTE — Telephone Encounter (Signed)
Pt advised, understood & new physician order for standing TSH lab [Q6 [id number]] faxed to [facility] Dr. Earlie Server attention/SLS

## 2013-09-19 ENCOUNTER — Other Ambulatory Visit: Payer: Self-pay | Admitting: *Deleted

## 2013-09-19 DIAGNOSIS — C3490 Malignant neoplasm of unspecified part of unspecified bronchus or lung: Secondary | ICD-10-CM

## 2013-09-19 MED ORDER — LORAZEPAM 1 MG PO TABS: 1.0000 mg | ORAL_TABLET | Freq: Three times a day (TID) | ORAL | Status: DC | PRN

## 2013-09-20 ENCOUNTER — Other Ambulatory Visit: Payer: Self-pay | Admitting: Medical Oncology

## 2013-09-20 DIAGNOSIS — E039 Hypothyroidism, unspecified: Secondary | ICD-10-CM

## 2013-09-20 NOTE — Progress Notes (Signed)
Dx code 244.9 for TSH per Jonny Ruiz , PA-C

## 2013-09-22 ENCOUNTER — Telehealth: Payer: Self-pay | Admitting: Internal Medicine

## 2013-09-22 ENCOUNTER — Ambulatory Visit (HOSPITAL_BASED_OUTPATIENT_CLINIC_OR_DEPARTMENT_OTHER): Payer: Commercial Managed Care - HMO

## 2013-09-22 ENCOUNTER — Other Ambulatory Visit: Payer: Self-pay | Admitting: Medical Oncology

## 2013-09-22 ENCOUNTER — Ambulatory Visit (HOSPITAL_COMMUNITY)
Admission: RE | Admit: 2013-09-22 | Discharge: 2013-09-22 | Disposition: A | Discharge status: Home / Self Care | Payer: Medicare HMO | Source: Ambulatory Visit | Attending: Internal Medicine | Admitting: Internal Medicine

## 2013-09-22 ENCOUNTER — Ambulatory Visit: Payer: Commercial Managed Care - HMO

## 2013-09-22 VITALS — BP 116/70 | HR 75 | Temp 98.9°F | Resp 16

## 2013-09-22 DIAGNOSIS — D6481 Anemia due to antineoplastic chemotherapy: Secondary | ICD-10-CM

## 2013-09-22 DIAGNOSIS — T451X5A Adverse effect of antineoplastic and immunosuppressive drugs, initial encounter: Secondary | ICD-10-CM | POA: Insufficient documentation

## 2013-09-22 DIAGNOSIS — C3490 Malignant neoplasm of unspecified part of unspecified bronchus or lung: Secondary | ICD-10-CM

## 2013-09-22 DIAGNOSIS — C342 Malignant neoplasm of middle lobe, bronchus or lung: Secondary | ICD-10-CM

## 2013-09-22 DIAGNOSIS — C3491 Malignant neoplasm of unspecified part of right bronchus or lung: Secondary | ICD-10-CM

## 2013-09-22 DIAGNOSIS — Z85819 Personal history of malignant neoplasm of unspecified site of lip, oral cavity, and pharynx: Secondary | ICD-10-CM | POA: Diagnosis not present

## 2013-09-22 LAB — COMPREHENSIVE METABOLIC PANEL (CC13)
ALBUMIN: 2.4 g/dL — AB (ref 3.5–5.0)
ALT: 9 U/L (ref 0–55)
ANION GAP: 10 meq/L (ref 3–11)
AST: 14 U/L (ref 5–34)
Alkaline Phosphatase: 56 U/L (ref 40–150)
BUN: 19.3 mg/dL (ref 7.0–26.0)
CALCIUM: 8.6 mg/dL (ref 8.4–10.4)
CHLORIDE: 108 meq/L (ref 98–109)
CO2: 26 meq/L (ref 22–29)
Creatinine: 1.1 mg/dL (ref 0.7–1.3)
Glucose: 116 mg/dl (ref 70–140)
POTASSIUM: 3.2 meq/L — AB (ref 3.5–5.1)
Sodium: 143 mEq/L (ref 136–145)
Total Bilirubin: 0.21 mg/dL (ref 0.20–1.20)
Total Protein: 6.4 g/dL (ref 6.4–8.3)

## 2013-09-22 LAB — CBC WITH DIFFERENTIAL/PLATELET
BASO%: 1 % (ref 0.0–2.0)
BASOS ABS: 0 10*3/uL (ref 0.0–0.1)
EOS%: 2.1 % (ref 0.0–7.0)
Eosinophils Absolute: 0.1 10*3/uL (ref 0.0–0.5)
HEMATOCRIT: 24.9 % — AB (ref 38.4–49.9)
HGB: 8.1 g/dL — ABNORMAL LOW (ref 13.0–17.1)
LYMPH#: 0.3 10*3/uL — AB (ref 0.9–3.3)
LYMPH%: 12.7 % — ABNORMAL LOW (ref 14.0–49.0)
MCH: 31.6 pg (ref 27.2–33.4)
MCHC: 32.6 g/dL (ref 32.0–36.0)
MCV: 96.7 fL (ref 79.3–98.0)
MONO#: 0.2 10*3/uL (ref 0.1–0.9)
MONO%: 8.9 % (ref 0.0–14.0)
NEUT#: 2 10*3/uL (ref 1.5–6.5)
NEUT%: 75.3 % — AB (ref 39.0–75.0)
Platelets: 85 10*3/uL — ABNORMAL LOW (ref 140–400)
RBC: 2.57 10*6/uL — ABNORMAL LOW (ref 4.20–5.82)
RDW: 22.5 % — AB (ref 11.0–14.6)
WBC: 2.7 10*3/uL — AB (ref 4.0–10.3)

## 2013-09-22 LAB — PREPARE RBC (CROSSMATCH)

## 2013-09-22 LAB — HOLD TUBE, BLOOD BANK

## 2013-09-22 MED ORDER — ACETAMINOPHEN 325 MG PO TABS
650.0000 mg | ORAL_TABLET | Freq: Once | ORAL | Status: AC
  Administered 2013-09-22: 650 mg via ORAL

## 2013-09-22 MED ORDER — HEPARIN SOD (PORK) LOCK FLUSH 100 UNIT/ML IV SOLN
500.0000 [IU] | Freq: Once | INTRAVENOUS | Status: AC | PRN
  Administered 2013-09-22: 500 [IU]
  Filled 2013-09-22: qty 5

## 2013-09-22 MED ORDER — ACETAMINOPHEN 325 MG PO TABS
ORAL_TABLET | ORAL | Status: AC
  Filled 2013-09-22: qty 2

## 2013-09-22 MED ORDER — INFLUENZA VAC SPLIT QUAD 0.5 ML IM SUSY
0.5000 mL | PREFILLED_SYRINGE | Freq: Once | INTRAMUSCULAR | Status: DC
  Filled 2013-09-22: qty 0.5

## 2013-09-22 MED ORDER — SODIUM CHLORIDE 0.9 % IJ SOLN
10.0000 mL | INTRAMUSCULAR | Status: DC | PRN
  Administered 2013-09-22: 10 mL via INTRAVENOUS
  Filled 2013-09-22: qty 10

## 2013-09-22 MED ORDER — DIPHENHYDRAMINE HCL 25 MG PO CAPS
25.0000 mg | ORAL_CAPSULE | Freq: Once | ORAL | Status: AC
  Administered 2013-09-22: 25 mg via ORAL

## 2013-09-22 MED ORDER — DIPHENHYDRAMINE HCL 25 MG PO CAPS
ORAL_CAPSULE | ORAL | Status: AC
  Filled 2013-09-22: qty 1

## 2013-09-22 MED ORDER — SODIUM CHLORIDE 0.9 % IV SOLN: 250.0000 mL | Freq: Once | INTRAVENOUS | Status: DC

## 2013-09-22 MED ORDER — SODIUM CHLORIDE 0.9 % IJ SOLN
10.0000 mL | INTRAMUSCULAR | Status: DC | PRN
  Administered 2013-09-22: 10 mL
  Filled 2013-09-22: qty 10

## 2013-09-22 NOTE — Telephone Encounter (Signed)
Added flush to 09/10 sch per 09/03 POF....Cherylann Banas

## 2013-09-22 NOTE — Patient Instructions (Signed)

## 2013-09-22 NOTE — Progress Notes (Signed)
Per Dr. Julien Nordmann; cancel tx today; administer 1 unit of PRBC; schedule next week for treatment.  Notified pt and pt/family verbalized understanding of information.

## 2013-09-22 NOTE — Patient Instructions (Signed)

## 2013-09-23 LAB — TYPE AND SCREEN
ABO/RH(D): A POS
ANTIBODY SCREEN: NEGATIVE
UNIT DIVISION: 0

## 2013-09-27 ENCOUNTER — Telehealth: Payer: Self-pay

## 2013-09-27 ENCOUNTER — Other Ambulatory Visit: Payer: Self-pay

## 2013-09-27 MED ORDER — POTASSIUM CHLORIDE CRYS ER 20 MEQ PO TBCR: 20.0000 meq | EXTENDED_RELEASE_TABLET | Freq: Every day | ORAL | Status: AC

## 2013-09-27 NOTE — Telephone Encounter (Signed)
Wife notified about kdur rx to pick up for pt.

## 2013-09-27 NOTE — Telephone Encounter (Signed)
Message copied by Bevelyn Ngo on Tue Sep 27, 2013  8:23 AM ------      Message from: Carlton Adam      Created: Sat Sep 24, 2013 12:40 AM       Abnormal results, please call in following prescription and notify patient. KCl 20 meq by mouth daily for 7 days ------

## 2013-09-29 ENCOUNTER — Ambulatory Visit (HOSPITAL_BASED_OUTPATIENT_CLINIC_OR_DEPARTMENT_OTHER): Payer: Commercial Managed Care - HMO

## 2013-09-29 ENCOUNTER — Ambulatory Visit (HOSPITAL_BASED_OUTPATIENT_CLINIC_OR_DEPARTMENT_OTHER): Payer: Commercial Managed Care - HMO | Admitting: Internal Medicine

## 2013-09-29 ENCOUNTER — Telehealth: Payer: Self-pay | Admitting: *Deleted

## 2013-09-29 ENCOUNTER — Other Ambulatory Visit (HOSPITAL_BASED_OUTPATIENT_CLINIC_OR_DEPARTMENT_OTHER): Payer: Commercial Managed Care - HMO

## 2013-09-29 ENCOUNTER — Other Ambulatory Visit: Payer: Commercial Managed Care - HMO

## 2013-09-29 ENCOUNTER — Ambulatory Visit: Payer: Commercial Managed Care - HMO

## 2013-09-29 ENCOUNTER — Ambulatory Visit (HOSPITAL_COMMUNITY): Payer: Medicare HMO

## 2013-09-29 ENCOUNTER — Encounter: Payer: Self-pay | Admitting: Internal Medicine

## 2013-09-29 ENCOUNTER — Telehealth: Payer: Self-pay | Admitting: Internal Medicine

## 2013-09-29 VITALS — BP 100/80 | HR 80 | Temp 97.6°F | Resp 18

## 2013-09-29 VITALS — BP 123/50 | HR 78 | Temp 98.3°F | Resp 18 | Ht 72.0 in | Wt 168.4 lb

## 2013-09-29 DIAGNOSIS — D6481 Anemia due to antineoplastic chemotherapy: Secondary | ICD-10-CM

## 2013-09-29 DIAGNOSIS — D696 Thrombocytopenia, unspecified: Secondary | ICD-10-CM

## 2013-09-29 DIAGNOSIS — C3490 Malignant neoplasm of unspecified part of unspecified bronchus or lung: Secondary | ICD-10-CM

## 2013-09-29 DIAGNOSIS — T451X5A Adverse effect of antineoplastic and immunosuppressive drugs, initial encounter: Secondary | ICD-10-CM

## 2013-09-29 DIAGNOSIS — C3491 Malignant neoplasm of unspecified part of right bronchus or lung: Secondary | ICD-10-CM

## 2013-09-29 DIAGNOSIS — Z5111 Encounter for antineoplastic chemotherapy: Secondary | ICD-10-CM

## 2013-09-29 DIAGNOSIS — Z95828 Presence of other vascular implants and grafts: Secondary | ICD-10-CM

## 2013-09-29 LAB — COMPREHENSIVE METABOLIC PANEL (CC13)
ALT: 7 U/L (ref 0–55)
AST: 17 U/L (ref 5–34)
Albumin: 2.4 g/dL — ABNORMAL LOW (ref 3.5–5.0)
Alkaline Phosphatase: 49 U/L (ref 40–150)
Anion Gap: 11 mEq/L (ref 3–11)
BUN: 23.2 mg/dL (ref 7.0–26.0)
CO2: 22 mEq/L (ref 22–29)
Calcium: 8.7 mg/dL (ref 8.4–10.4)
Chloride: 108 mEq/L (ref 98–109)
Creatinine: 1.2 mg/dL (ref 0.7–1.3)
Glucose: 99 mg/dl (ref 70–140)
Potassium: 4.1 mEq/L (ref 3.5–5.1)
Sodium: 141 mEq/L (ref 136–145)
Total Bilirubin: 0.24 mg/dL (ref 0.20–1.20)
Total Protein: 6.6 g/dL (ref 6.4–8.3)

## 2013-09-29 LAB — CBC WITH DIFFERENTIAL/PLATELET
BASO%: 1.2 % (ref 0.0–2.0)
Basophils Absolute: 0 10*3/uL (ref 0.0–0.1)
EOS%: 1.5 % (ref 0.0–7.0)
Eosinophils Absolute: 0.1 10*3/uL (ref 0.0–0.5)
HEMATOCRIT: 24.6 % — AB (ref 38.4–49.9)
HGB: 7.9 g/dL — ABNORMAL LOW (ref 13.0–17.1)
LYMPH#: 0.5 10*3/uL — AB (ref 0.9–3.3)
LYMPH%: 15.7 % (ref 14.0–49.0)
MCH: 30.4 pg (ref 27.2–33.4)
MCHC: 32.1 g/dL (ref 32.0–36.0)
MCV: 94.6 fL (ref 79.3–98.0)
MONO#: 0.7 10*3/uL (ref 0.1–0.9)
MONO%: 19.8 % — ABNORMAL HIGH (ref 0.0–14.0)
NEUT#: 2.1 10*3/uL (ref 1.5–6.5)
NEUT%: 61.8 % (ref 39.0–75.0)
NRBC: 0 % (ref 0–0)
Platelets: 120 10*3/uL — ABNORMAL LOW (ref 140–400)
RBC: 2.6 10*6/uL — ABNORMAL LOW (ref 4.20–5.82)
RDW: 22.6 % — AB (ref 11.0–14.6)
WBC: 3.4 10*3/uL — AB (ref 4.0–10.3)

## 2013-09-29 LAB — TECHNOLOGIST REVIEW

## 2013-09-29 LAB — PREPARE RBC (CROSSMATCH)

## 2013-09-29 MED ORDER — DEXAMETHASONE SODIUM PHOSPHATE 10 MG/ML IJ SOLN
INTRAMUSCULAR | Status: AC
  Filled 2013-09-29: qty 1

## 2013-09-29 MED ORDER — PACLITAXEL PROTEIN-BOUND CHEMO INJECTION 100 MG
80.0000 mg/m2 | Freq: Once | INTRAVENOUS | Status: AC
  Administered 2013-09-29: 150 mg via INTRAVENOUS
  Filled 2013-09-29: qty 30

## 2013-09-29 MED ORDER — TRAMADOL HCL 50 MG PO TABS: 50.0000 mg | ORAL_TABLET | Freq: Four times a day (QID) | ORAL | Status: AC | PRN

## 2013-09-29 MED ORDER — ONDANSETRON 8 MG/NS 50 ML IVPB
INTRAVENOUS | Status: AC
  Filled 2013-09-29: qty 8

## 2013-09-29 MED ORDER — DEXAMETHASONE SODIUM PHOSPHATE 10 MG/ML IJ SOLN
10.0000 mg | Freq: Once | INTRAMUSCULAR | Status: AC
  Administered 2013-09-29: 10 mg via INTRAVENOUS

## 2013-09-29 MED ORDER — SODIUM CHLORIDE 0.9 % IV SOLN
Freq: Once | INTRAVENOUS | Status: AC
  Administered 2013-09-29: 13:00:00 via INTRAVENOUS

## 2013-09-29 MED ORDER — SODIUM CHLORIDE 0.9 % IJ SOLN
10.0000 mL | INTRAMUSCULAR | Status: DC | PRN
  Administered 2013-09-29: 10 mL
  Filled 2013-09-29: qty 10

## 2013-09-29 MED ORDER — SODIUM CHLORIDE 0.9 % IJ SOLN
10.0000 mL | INTRAMUSCULAR | Status: DC | PRN
  Filled 2013-09-29: qty 10

## 2013-09-29 MED ORDER — ACETAMINOPHEN 325 MG PO TABS
ORAL_TABLET | ORAL | Status: AC
  Filled 2013-09-29: qty 2

## 2013-09-29 MED ORDER — HEPARIN SOD (PORK) LOCK FLUSH 100 UNIT/ML IV SOLN
500.0000 [IU] | Freq: Once | INTRAVENOUS | Status: AC | PRN
  Administered 2013-09-29: 500 [IU]
  Filled 2013-09-29: qty 5

## 2013-09-29 MED ORDER — ACETAMINOPHEN 325 MG PO TABS
650.0000 mg | ORAL_TABLET | Freq: Once | ORAL | Status: AC
  Administered 2013-09-29: 650 mg via ORAL

## 2013-09-29 MED ORDER — SODIUM CHLORIDE 0.9 % IJ SOLN
10.0000 mL | INTRAMUSCULAR | Status: DC | PRN
  Administered 2013-09-29: 10 mL via INTRAVENOUS
  Filled 2013-09-29: qty 10

## 2013-09-29 MED ORDER — DIPHENHYDRAMINE HCL 25 MG PO CAPS
ORAL_CAPSULE | ORAL | Status: AC
  Filled 2013-09-29: qty 1

## 2013-09-29 MED ORDER — LORAZEPAM 1 MG PO TABS: 1.0000 mg | ORAL_TABLET | Freq: Three times a day (TID) | ORAL | Status: AC | PRN

## 2013-09-29 MED ORDER — ONDANSETRON 8 MG/50ML IVPB (CHCC)
8.0000 mg | Freq: Once | INTRAVENOUS | Status: AC
  Administered 2013-09-29: 8 mg via INTRAVENOUS

## 2013-09-29 MED ORDER — HEPARIN SOD (PORK) LOCK FLUSH 100 UNIT/ML IV SOLN
500.0000 [IU] | Freq: Once | INTRAVENOUS | Status: DC
  Filled 2013-09-29: qty 5

## 2013-09-29 MED ORDER — DIPHENHYDRAMINE HCL 25 MG PO CAPS
25.0000 mg | ORAL_CAPSULE | Freq: Once | ORAL | Status: AC
  Administered 2013-09-29: 25 mg via ORAL

## 2013-09-29 NOTE — Telephone Encounter (Signed)
Per staff message and POF I have scheduled appts. Advised scheduler of appts. JMW  

## 2013-09-29 NOTE — Progress Notes (Signed)
Due to time constraints, Dr. Julien Nordmann spoke with patient and discussed receiving only 1 unit RBC's today. Patient verbalizes agreement and states he will not be able to return for 2nd unit on Friday 09/30/13. OK per Dr. Vista Mink to receive 1 unit and will recheck labs per [facility].

## 2013-09-29 NOTE — Patient Instructions (Signed)

## 2013-09-29 NOTE — Patient Instructions (Addendum)
[facility] Discharge Instructions for Patients Receiving Chemotherapy  Today you received the following chemotherapy agents  Abraxane  To help prevent nausea and vomiting after your treatment, we encourage you to take your nausea medication Zofran   If you develop nausea and vomiting that is not controlled by your nausea medication, call the clinic.   BELOW ARE SYMPTOMS THAT SHOULD BE REPORTED IMMEDIATELY:  *FEVER GREATER THAN 100.5 F  *CHILLS WITH OR WITHOUT FEVER  NAUSEA AND VOMITING THAT IS NOT CONTROLLED WITH YOUR NAUSEA MEDICATION  *UNUSUAL SHORTNESS OF BREATH  *UNUSUAL BRUISING OR BLEEDING  TENDERNESS IN MOUTH AND THROAT WITH OR WITHOUT PRESENCE OF ULCERS  *URINARY PROBLEMS  *BOWEL PROBLEMS  UNUSUAL RASH Items with * indicate a potential emergency and should be followed up as soon as possible.  Feel free to call the clinic you have any questions or concerns. The clinic phone number is [phone].   Blood Transfusion Information WHAT IS A BLOOD TRANSFUSION? A transfusion is the replacement of blood or some of its parts. Blood is made up of multiple cells which provide different functions.  Red blood cells carry oxygen and are used for blood loss replacement.  White blood cells fight against infection.  Platelets control bleeding.  Plasma helps clot blood.  Other blood products are available for specialized needs, such as hemophilia or other clotting disorders. BEFORE THE TRANSFUSION  Who gives blood for transfusions?   You may be able to donate blood to be used at a later date on yourself (autologous donation).  Relatives can be asked to donate blood. This is generally not any safer than if you have received blood from a stranger. The same precautions are taken to ensure safety when a relative's blood is donated.  Healthy volunteers who are fully evaluated to make sure their blood is safe. This is blood bank blood. Transfusion therapy is  the safest it has ever been in the practice of medicine. Before blood is taken from a donor, a complete history is taken to make sure that person has no history of diseases nor engages in risky social behavior (examples are intravenous drug use or sexual activity with multiple partners). The donor's travel history is screened to minimize risk of transmitting infections, such as malaria. The donated blood is tested for signs of infectious diseases, such as HIV and hepatitis. The blood is then tested to be sure it is compatible with you in order to minimize the chance of a transfusion reaction. If you or a relative donates blood, this is often done in anticipation of surgery and is not appropriate for emergency situations. It takes many days to process the donated blood. RISKS AND COMPLICATIONS Although transfusion therapy is very safe and saves many lives, the main dangers of transfusion include:   Getting an infectious disease.  Developing a transfusion reaction. This is an allergic reaction to something in the blood you were given. Every precaution is taken to prevent this. The decision to have a blood transfusion has been considered carefully by your caregiver before blood is given. Blood is not given unless the benefits outweigh the risks. AFTER THE TRANSFUSION  Right after receiving a blood transfusion, you will usually feel much better and more energetic. This is especially true if your red blood cells have gotten low (anemic). The transfusion raises the level of the red blood cells which carry oxygen, and this usually causes an energy increase.  The nurse administering the transfusion will monitor you carefully  for complications. HOME CARE INSTRUCTIONS  No special instructions are needed after a transfusion. You may find your energy is better. Speak with your caregiver about any limitations on activity for underlying diseases you may have. SEEK MEDICAL CARE IF:   Your condition is not  improving after your transfusion.  You develop redness or irritation at the intravenous (IV) site. SEEK IMMEDIATE MEDICAL CARE IF:  Any of the following symptoms occur over the next 12 hours:  Shaking chills.  You have a temperature by mouth above 102 F (38.9 C), not controlled by medicine.  Chest, back, or muscle pain.  People around you feel you are not acting correctly or are confused.  Shortness of breath or difficulty breathing.  Dizziness and fainting.  You get a rash or develop hives.  You have a decrease in urine output.  Your urine turns a dark color or changes to pink, red, or brown. Any of the following symptoms occur over the next 10 days:  You have a temperature by mouth above 102 F (38.9 C), not controlled by medicine.  Shortness of breath.  Weakness after normal activity.  The white part of the eye turns yellow (jaundice).  You have a decrease in the amount of urine or are urinating less often.  Your urine turns a dark color or changes to pink, red, or brown. Document Released: 01/04/2000 Document Revised: 03/31/2011 Document Reviewed: 08/23/2007 [facility] Patient Information 2015 [place], [place]. This information is not intended to replace advice given to you by your health care provider. Make sure you discuss any questions you have with your health care provider.

## 2013-09-29 NOTE — Progress Notes (Signed)
[phone]   [phone]) [phone]  OFFICE VISIT PROGRESS NOTE  Samuel Rio, PA-C [address] [place] [address]  DIAGNOSIS AND STAGE:  1) Poorly differentiated non-small cell lung cancer diagnosed in December of 2014 2) Extensive stage small cell lung cancer diagnosed in July 2014.  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 4)  History of oncocytic renal tumor status post radiofrequency ablation at [facility] in August of 2008.  PRIOR THERAPY:  1) Systemic chemotherapy with carboplatin for AUC of 5 on day 1 and etoposide 120 mg/M2 on days 1, 2 and 3 with Neulasta support on day 4, status post 4 cycles. First dose was given on 08/18/2012.   2) Systemic chemotherapy with carboplatin for AUC of 5 on day 1 and Abraxane 80 mg/M2 on days 1, 8 and 15 every 3 weeks, status post 9 cycles.  CURRENT THERAPY: Systemic chemotherapy with single agent Abraxane 80 mg/M2 weekly. First dose 10/06/2013.  INTERVAL HISTORY: [facility] 79 y.o. male returns to the clinic today for followup visit accompanied by his wife [facility].   On 01/29/2013, The patient is feeling much better today with no specific complaints except for the occasional right-sided chest pain and this is well-controlled with his current pain medication with fentanyl patch 50 mcg/hour every 3 days in addition to Percocet on as-needed basis. He takes around 2-3 Percocet on daily basis for breakthrough pain. His shortness of breath is much improved. The patient continues to have drainage from the Pleuryx catheter but this is down to less than 50 cc every 3 days and he is expected to have his Pleurx catheter removed and few weeks by Dr. Kerin [facility] at [facility]. He  recently underwent biopsy of one of the right pleural-based nodule at [facility] and the final pathology was consistent with poorly differentiated non-small cell carcinoma. The tumor cells are of small size with hyperchromatic nuclei and some molded forms. Many of the cells have abundant cytoplasm and nuclei with prominent nucleoli. Strong immunoreactivity is observed for anti-keratins and CK5/6, with negative staining observed for calretinin, WT-1, CK7, desmin, TTF-1 , Fli-1, CD99 and CD56. The entirety of these findings is most consistent with non-small carcinoma, with immunohistochemical evidence of rudimentary squamous differentiation. Bright pankeratin/ CK5/6 immunoreactivity with negative staining for TTF-1 and CD56 is not typical of small cell carcinoma.  He was seen recently by Dr. Sharlet [facility] who sent him to be treated locally for the recently diagnosed non-small cell carcinoma as the patient is not a candidate for the clinical trial with Ipilumomab and Nivolumab for the second line option for the previously diagnosed small cell lung cancer. The patient was also seen recently at [facility] by Dr. Lewanda [facility] who discussed within palliative systemic chemotherapy for his condition. The patient is not accepting the fact that he has an incurable condition and he still shopping around for other treatment options. He also requested a referral to the [facility]. He came today for evaluation and discussion of his treatment options after the recent diagnosis of non-small cell carcinoma.  On 02/21/2013, the patient present today for evaluation of his condition. He was seen by her medical oncologist in the last few weeks including  Dr. Harlow [facility] at [facility] in [facility] as well as a Statistician at [employer] in [place]. The visit records from Dr. Harlow [facility] are currently available to me and he is in agreement  with the current evaluation of Mr. [facility] regarding dealing with 2 different lung cancer including small cell lung cancer which was treated with carboplatin and etoposide in addition to recently diagnosed poorly differentiated non-small cell carcinoma. He also concurs with the treatment options provided for [facility] including treatment with carboplatin and Abraxane. The patient is here today for evaluation and consideration of starting treatment for his newly diagnosed poorly differentiated non-small cell lung cancer. He is feeling much better today with no specific complaints except for mild fatigue. His Pleurx catheter drains less than 75 cc of pleural fluid every 3 days. The molecular biomarkers performed at [facility] were negative for EGFR mutation and the ALK gene translocation.  On 03/17/2013, the patient is here today for evaluation accompanied by his wife. He tolerated the first cycle of his systemic chemotherapy with carboplatin and Abraxane fairly well with no significant adverse effects. He denied having any significant nausea or vomiting, no fever or chills. He lost 1 pound since his last visit. He has good appetite but concerned about dysphagia from his previous head and neck cancer. I discussed with referred to gastroenterology for evaluation and consideration of PEG tube placement if needed but the patient declined and mentions that he is feeling okay for now. Pleurx catheter was removed yesterday by Dr. Kerin [facility]. He was supposed to start cycle #2 today but has low platelets count.  On 04/14/2013, The patient has been complaining of increasing fatigue and weakness as well as lack of appetite and dysphagia secondary to prior history of head and neck cancer. He was recently admitted to [facility] for treatment of dehydration and failure to thrive. During his admission he underwent PEG tube placement and he is currently receiving supplemental nutrition through the PEG tube. He is  able to maintain his weight well.  The patient is too weak today to resume systemic chemotherapy. I will keep his treatment on hold for now. I would repeat CT scan of the chest, abdomen and pelvis for restaging of his disease before resuming her systemic therapy and if no improvement the patient may be considered for palliative care and hospice referral.  For the chemotherapy-induced anemia, I will arrange for the patient to receive 2 units of PRBCs transfusion.  On 04/25/2013 The patient came to the clinic today accompanied by his wife for routine followup visit. He is feeling much better after receiving 2 units of PRBCs transfusion. He has more energy. He also eats a little bit better with supplemental nutrition. He continues to have mild fatigue. He denied having any significant chest pain, shortness of breath, cough or hemoptysis. He had repeat CT scan of the chest, abdomen and pelvis and he is here for evaluation and discussion of his scan results.  On 05/19/2013, the patient is here today for evaluation. He tolerated the last cycle of his systemic chemotherapy with carboplatin and Abraxane fairly well except for increasing peripheral neuropathy mainly in the fingers. He has back pain recently and would like to have referral to see Dr. Sharol Given for evaluation. He denied having any significant fever or chills, no nausea or vomiting. He denied having any significant chest pain, shortness breath, cough or hemoptysis.  On 06/30/2013, the patient came to the clinic  today accompanied by his wife. He tolerated the last cycle of his systemic chemotherapy fairly well. He continues to have increasing pain and he described it from the toes to the head. He has been using increasing amount of oxycodone recently. He is currently also on Neurontin 200 mg by mouth 3 times a day. He is scheduled for MRI of the pelvis later this month and he requested that to be done under sedation. He denied having any significant chest  pain but continues to have shortness of breath with exertion with no cough or hemoptysis. He denied having any fever or chills. He had repeat CT scan of the chest, abdomen and pelvis performed recently and he is here for evaluation and discussion of his scan results.  On 08/11/2013, the patient is here today to start cycle #8 of his systemic chemotherapy with carboplatin and Abraxane. He tolerated the last cycle well with no specific complaints except for fatigue. He received 2 units of PRBCs transfusion a week ago. He continues to have pain in the lower back and he is currently under evaluation by Dr. Louanne Skye and expected to have surgical intervention in his back in the next few weeks. He is currently on several pain medication including Duragesic patch 50 mcg/hour every 3 days in addition to [facility] for breakthrough pain and gabapentin. He denied having any significant nausea or vomiting.  On 09/29/2013, the patient is here today accompanied by his wife. He was seen in the interval since his last visit by the physician assistant in a shared visit with me. The patient is tolerating his treatment was carboplatin and Abraxane well except for the persistent fatigue and chemotherapy-induced anemia with frequent requirement for PRBCs transfusion. His last CT scan of the chest, abdomen and pelvis performed a few weeks ago showed no evidence for disease progression. The patient discontinued from his own his pain medication with fentanyl patch and Percocet because of constipation. He is asking for alternative for the back pain. He was evaluated by Dr. Louanne Skye as well as neurology for the persistent back pain but no improvement. He is here today to start day 15 of cycle #9 of his systemic chemotherapy. He denied having any significant chest pain but continues to have shortness breath with exertion. He denied having any significant weight loss or night sweats. He has no nausea or vomiting.  MEDICAL HISTORY: Past Medical  History  Diagnosis Date  . Hyperlipidemia   . BPH (benign prostatic hypertrophy)   . Xerostomia   . Colon polyp     HYPERPLASTIC & TUBULAR ADENOMA(Colonoscopy-Dr.Fairview)  . Diverticulosis of colon (without mention of hemorrhage)     (Colonoscopy-Dr.[place])  . Internal hemorrhoids without mention of complication     (Colonoscopy-Dr.)  . GERD (gastroesophageal reflux disease)     (EGD-Dr. Velora Heckler)  . Atrophic gastritis without mention of hemorrhage     (EGD-Dr. Velora [facility])  . History of radiation therapy   . Pharynx cancer     squamous cell stage 4,s/p XRT,chemo, neck dissection  . Small cell lung cancer   . Non-small cell lung cancer   . Heart murmur   . OSA (obstructive sleep apnea)     does not use cpap dx 2005  . Shortness of breath   . Asthma     borderline  . Pneumonia   . Hypothyroidism   . Headache(784.0)   . Anemia   . Kidney disease     TCC  . Complication of anesthesia     "severe  claustrophbia"  . Claustrophobia     "severe"    ALLERGIES:  is allergic to erythromycin and tetracycline.  MEDICATIONS:  Current Outpatient Prescriptions  Medication Sig Dispense Refill  . docusate sodium (COLACE) 100 MG capsule Take 1 capsule (100 mg total) by mouth 2 (two) times daily.  100 capsule  prn  . finasteride (PROSCAR) 5 MG tablet Take 1 tablet (5 mg total) by mouth at bedtime.  30 tablet  3  . gabapentin (NEURONTIN) 300 MG capsule Take 1 capsule (300 mg total) by mouth 3 (three) times daily.  60 capsule  0  . levothyroxine (SYNTHROID, LEVOTHROID) 88 MCG tablet Take 1 tablet (88 mcg total) by mouth daily before breakfast.  30 tablet  3  . LORazepam (ATIVAN) 1 MG tablet Take 1 tablet (1 mg total) by mouth every 8 (eight) hours as needed for anxiety.  30 tablet  0  . magnesium citrate SOLN Take 1 Bottle by mouth as needed for severe constipation.      . mirtazapine (REMERON) 30 MG tablet TAKE ONE TABLET AT BEDTIME.  30 tablet  1  . mometasone (NASONEX) 50 MCG/ACT  nasal spray Place 2 sprays into the nose daily.      Marland Kitchen NUTRITIONAL SUPPLEMENT LIQD by PEG Tube route. Nutren 2.0.   2- 500 calorie bottles daily      . oxaprozin (DAYPRO) 600 MG tablet Take 600 mg by mouth 2 (two) times daily.      . potassium chloride SA (K-DUR,KLOR-CON) 20 MEQ tablet Take 1 tablet (20 mEq total) by mouth daily.  7 tablet  0  . fentaNYL (DURAGESIC - DOSED MCG/HR) 100 MCG/HR Place 100 mcg onto the skin every 3 (three) days.      Marland Kitchen oxyCODONE (OXY IR/ROXICODONE) 5 MG immediate release tablet Take 10 mg by mouth 3 (three) times daily.       No current facility-administered medications for this visit.   Facility-Administered Medications Ordered in Other Visits  Medication Dose Route Frequency Provider Last Rate Last Dose  . lactated ringers infusion    Continuous PRN Jenne Campus, CRNA      . midazolam (VERSED) injection    Anesthesia Aneta, CRNA   1 mg at 08/09/13 0855  . ondansetron [facility]) injection    Anesthesia Intra-op Jenne Campus, CRNA   4 mg at 08/09/13 0845  . propofol (DIPRIVAN) 10 mg/mL bolus/IV push    Anesthesia Intra-op Jenne Campus, CRNA   10 mg at 08/09/13 1000  . propofol (DIPRIVAN) infusion 10 mg/ml EMUL    Continuous PRN Jenne Campus, CRNA   20 mcg/kg/min at 08/09/13 1062    SURGICAL HISTORY:  Past Surgical History  Procedure Laterality Date  . Appendectomy    . Lumbar laminectomy    . Tonsillectomy    . Bilateral ganglionectomies Left   . Radical right neck dissection  2008  . Pci-rfa renal cell (tcc) cancer  2009    pt denies  . Cholecystectomy  dec. 2009  . Septoplasty  03/01/12    with double turbinectomy  . Video bronchoscopy Bilateral 08/10/2012    Procedure: VIDEO BRONCHOSCOPY WITH FLUORO;  Surgeon: Rigoberto Noel, MD;  Location: [place];  Service: Cardiopulmonary;  Laterality: Bilateral;  . Shoulder arthroscopy Left 2013  . Thoracentesis Right 10/2012  . Radiology with anesthesia N/A 07/07/2013    Procedure:  MRI OF PELVIC;  Surgeon: Medication Radiologist, MD;  Location: [place];  Service: Radiology;  Laterality:  N/A;  DR. Camila Li  . Radiology with anesthesia N/A 08/09/2013    Procedure: MRI LUMBER SPINE WITHOUT CONTRAST;  Surgeon: Medication Radiologist, MD;  Location: [place];  Service: Radiology;  Laterality: N/A;    REVIEW OF SYSTEMS:  Constitutional: negative Eyes: negative Ears, nose, mouth, throat, and face: negative Respiratory: positive for pleurisy/chest pain Cardiovascular: negative Gastrointestinal: positive for dysphagia Genitourinary:negative Integument/breast: negative Hematologic/lymphatic: negative Musculoskeletal:positive for back pain Neurological: negative Behavioral/Psych: positive for anxiety, bad mood, depression, irritability and mood swings Endocrine: negative Allergic/Immunologic: negative   PHYSICAL EXAMINATION: General appearance: alert, cooperative and no distress Head: Normocephalic, without obvious abnormality, atraumatic Neck: no adenopathy and supple, symmetrical, trachea midline Lymph nodes: Cervical, supraclavicular, and axillary nodes normal. Resp: clear to auscultation bilaterally Back: symmetric, no curvature. ROM normal. No CVA tenderness. Cardio: regular rate and rhythm, S1, S2 normal, no murmur, click, rub or gallop GI: soft, non-tender; bowel sounds normal; no masses,  no organomegaly Extremities: extremities normal, atraumatic, no cyanosis or edema Neurologic: Alert and oriented X 3, normal strength and tone. Normal symmetric reflexes. Normal coordination and gait   ECOG PERFORMANCE STATUS: 2 - Symptomatic, <50% confined to bed  Blood pressure 123/50, pulse 78, temperature 98.3 F (36.8 C), temperature source Oral, resp. rate 18, height 6' (1.829 m), weight 168 lb 6.4 oz (76.386 kg), SpO2 100.00%.  LABORATORY DATA: Lab Results  Component Value Date   WBC 3.4* 09/29/2013   HGB 7.9* 09/29/2013   HCT 24.6* 09/29/2013   MCV 94.6 09/29/2013    PLT 120* 09/29/2013      Chemistry      Component Value Date/Time   NA 143 09/22/2013 1024   NA 131* 04/05/2013 0545   K 3.2* 09/22/2013 1024   K 3.8 04/05/2013 0545   CL 93* 04/05/2013 0545   CO2 26 09/22/2013 1024   CO2 27 04/05/2013 0545   BUN 19.3 09/22/2013 1024   BUN 8 04/05/2013 0545   CREATININE 1.1 09/22/2013 1024   CREATININE 0.79 04/05/2013 0545      Component Value Date/Time   CALCIUM 8.6 09/22/2013 1024   CALCIUM 8.3* 04/05/2013 0545   ALKPHOS 56 09/22/2013 1024   ALKPHOS 76 05/13/2012 1005   AST 14 09/22/2013 1024   AST 16 05/13/2012 1005   ALT 9 09/22/2013 1024   ALT 11 05/13/2012 1005   BILITOT 0.21 09/22/2013 1024   BILITOT 0.5 05/13/2012 1005       RADIOGRAPHIC STUDIES: No results found.  ASSESSMENT AND PLAN: This is is a 79 years old white male who was diagnosed with extensive stage small cell lung cancer status post systemic chemotherapy with carboplatin and etoposide with progressive disease on the right side of the chest. He was seen at [facility] by Dr. Sharlet [facility] and was considered for a clinical trial with Ipilumomab and Nivolumab.  Recent repeat biopsy of a right pleural based nodule showed poorly differentiated non-small cell carcinoma. He is currently undergoing systemic chemotherapy with carboplatin and Abraxane status post 9 cycles. He tolerated the chemotherapy fairly well except for increasing fatigue secondary to chemotherapy-induced anemia as well as thrombocytopenia. He has been requiring frequent PRBCs transfusion lately. I have a lengthy discussion with the patient and his wife today about his current disease status and treatment options. The patient received 9 cycles of combination of carboplatin and Abraxane. In the absence of any disease progression after cycle #8, I recommended for the patient to drop the carboplatin and continuing on maintenance treatment with single  agent Abraxane at this point for further bone marrow suppression  from the carboplatin and hopefully this may decrease the frequency of his PRBCs transfusion and chemotherapy-induced anemia. The patient agreed to the current plan. Starting from cycle #10, the patient would be in treatment only with single agent Abraxane one weekly basis. For the chemotherapy-induced anemia, I will arrange for the patient to receive 1 unit of PRBCs transfusion today For pain management, I started the patient on Ultram 50 mg by mouth every 6 hours as needed for pain. He was advised to continue his routine followup visit and evaluation by Dr.Nitka. He would come back for followup visit in one month's for reevaluation and management any adverse effect of his treatment. He was advised to call immediately if he has any concerning symptoms in the interval. The patient voices understanding of current disease status and treatment options and is in agreement with the current care plan.  All questions were answered. The patient knows to call the clinic with any problems, questions or concerns. We can certainly see the patient much sooner if necessary.  Disclaimer: This note was dictated with voice recognition software. Similar sounding words can inadvertently be transcribed and may not be corrected upon review.  Eilleen Kempf., MD 09/29/2013

## 2013-09-29 NOTE — Telephone Encounter (Signed)
Pt confirmed labs/ov per 09/10 POF, sent msg to add chemo, gave pt AVS..Marland KitchenKJ

## 2013-09-30 ENCOUNTER — Telehealth: Payer: Self-pay | Admitting: Physician Assistant

## 2013-09-30 ENCOUNTER — Telehealth: Payer: Self-pay | Admitting: Diagnostic Neuroimaging

## 2013-09-30 ENCOUNTER — Telehealth: Payer: Self-pay | Admitting: *Deleted

## 2013-09-30 DIAGNOSIS — C3490 Malignant neoplasm of unspecified part of unspecified bronchus or lung: Secondary | ICD-10-CM

## 2013-09-30 NOTE — Telephone Encounter (Signed)
Patient called directly back and requested that his call be returned as soon as possible because he is in a lot of pain

## 2013-09-30 NOTE — Telephone Encounter (Signed)
Pt's wife called Thora Lance, dietition with [facility].  She spoke with Dory Peru regarding pt's intolerance to his tube feeds.  He has been vomiting and not tolerating them well.  New order for a different tube feed signed by Dr Benay Spice in Dr [facility] absence, and faxed to [phone].  Dory Peru will f/u with pt outpatient at next f/u visit on 10/06/13.  SLJ

## 2013-09-30 NOTE — Telephone Encounter (Signed)
Patients states he has not received any feed back from his visit to our office. He states he knows he came in and was examined but doesn't remember any outcome. He would like for someone to call him.

## 2013-09-30 NOTE — Telephone Encounter (Signed)
Referral placed.

## 2013-09-30 NOTE — Telephone Encounter (Signed)
Requesting referral to Dr. Earl Gala oncologist

## 2013-10-03 ENCOUNTER — Telehealth: Payer: Self-pay | Admitting: *Deleted

## 2013-10-03 LAB — TYPE AND SCREEN
ABO/RH(D): A POS
Antibody Screen: NEGATIVE
UNIT DIVISION: 0
Unit division: 0

## 2013-10-03 NOTE — Telephone Encounter (Signed)
Per pt request, pt is cancelling appointment on Thursday, 10/06/13.  Appts cancelled.  They will come for their next scheduled appts as already scheduled.  SLJ

## 2013-10-04 ENCOUNTER — Telehealth: Payer: Self-pay | Admitting: *Deleted

## 2013-10-04 NOTE — Telephone Encounter (Signed)
Per staff message I have rescheduled appts

## 2013-10-04 NOTE — Telephone Encounter (Signed)
Pt's wife called asking if pt can be added back on to the schedule on 9/17.  Appts put back on schedule, called and left a msg with new appt times.  SLJ

## 2013-10-06 ENCOUNTER — Ambulatory Visit: Payer: Commercial Managed Care - HMO

## 2013-10-06 ENCOUNTER — Other Ambulatory Visit (HOSPITAL_BASED_OUTPATIENT_CLINIC_OR_DEPARTMENT_OTHER): Payer: Commercial Managed Care - HMO

## 2013-10-06 ENCOUNTER — Encounter: Payer: Self-pay | Admitting: Medical Oncology

## 2013-10-06 ENCOUNTER — Other Ambulatory Visit: Payer: Self-pay | Admitting: Medical Oncology

## 2013-10-06 ENCOUNTER — Other Ambulatory Visit: Payer: Commercial Managed Care - HMO

## 2013-10-06 ENCOUNTER — Ambulatory Visit (HOSPITAL_BASED_OUTPATIENT_CLINIC_OR_DEPARTMENT_OTHER): Payer: Commercial Managed Care - HMO

## 2013-10-06 VITALS — BP 130/63 | HR 88 | Temp 99.3°F | Resp 18

## 2013-10-06 DIAGNOSIS — C342 Malignant neoplasm of middle lobe, bronchus or lung: Secondary | ICD-10-CM

## 2013-10-06 DIAGNOSIS — C3491 Malignant neoplasm of unspecified part of right bronchus or lung: Secondary | ICD-10-CM

## 2013-10-06 DIAGNOSIS — C3490 Malignant neoplasm of unspecified part of unspecified bronchus or lung: Secondary | ICD-10-CM

## 2013-10-06 DIAGNOSIS — Z95828 Presence of other vascular implants and grafts: Secondary | ICD-10-CM

## 2013-10-06 DIAGNOSIS — Z5111 Encounter for antineoplastic chemotherapy: Secondary | ICD-10-CM

## 2013-10-06 LAB — CBC WITH DIFFERENTIAL/PLATELET
BASO%: 0.7 % (ref 0.0–2.0)
Basophils Absolute: 0 10*3/uL (ref 0.0–0.1)
EOS%: 2 % (ref 0.0–7.0)
Eosinophils Absolute: 0.1 10*3/uL (ref 0.0–0.5)
HEMATOCRIT: 27.4 % — AB (ref 38.4–49.9)
HGB: 8.8 g/dL — ABNORMAL LOW (ref 13.0–17.1)
LYMPH#: 0.6 10*3/uL — AB (ref 0.9–3.3)
LYMPH%: 12.9 % — ABNORMAL LOW (ref 14.0–49.0)
MCH: 30.9 pg (ref 27.2–33.4)
MCHC: 32.1 g/dL (ref 32.0–36.0)
MCV: 96.1 fL (ref 79.3–98.0)
MONO#: 0.6 10*3/uL (ref 0.1–0.9)
MONO%: 12.4 % (ref 0.0–14.0)
NEUT#: 3.2 10*3/uL (ref 1.5–6.5)
NEUT%: 72 % (ref 39.0–75.0)
Platelets: 107 10*3/uL — ABNORMAL LOW (ref 140–400)
RBC: 2.85 10*6/uL — ABNORMAL LOW (ref 4.20–5.82)
RDW: 22.1 % — AB (ref 11.0–14.6)
WBC: 4.5 10*3/uL (ref 4.0–10.3)

## 2013-10-06 LAB — COMPREHENSIVE METABOLIC PANEL
ALBUMIN: 2.7 g/dL — AB (ref 3.5–5.2)
ALT: 8 U/L (ref 0–53)
AST: 14 U/L (ref 0–37)
Alkaline Phosphatase: 63 U/L (ref 39–117)
BUN: 18 mg/dL (ref 6–23)
CHLORIDE: 104 meq/L (ref 96–112)
CO2: 25 meq/L (ref 19–32)
CREATININE: 1.07 mg/dL (ref 0.50–1.35)
Calcium: 9.1 mg/dL (ref 8.4–10.5)
Glucose, Bld: 121 mg/dL — ABNORMAL HIGH (ref 70–99)
POTASSIUM: 4.1 meq/L (ref 3.5–5.3)
SODIUM: 140 meq/L (ref 135–145)
Total Bilirubin: <0.2 mg/dL — ABNORMAL LOW (ref 0.2–1.2)
Total Protein: 6.9 g/dL (ref 6.0–8.3)

## 2013-10-06 MED ORDER — OXYCODONE HCL 5 MG PO CAPS: 5.0000 mg | ORAL_CAPSULE | ORAL | Status: AC | PRN

## 2013-10-06 MED ORDER — HEPARIN SOD (PORK) LOCK FLUSH 100 UNIT/ML IV SOLN
500.0000 [IU] | Freq: Once | INTRAVENOUS | Status: AC | PRN
  Administered 2013-10-06: 500 [IU]
  Filled 2013-10-06: qty 5

## 2013-10-06 MED ORDER — SODIUM CHLORIDE 0.9 % IV SOLN
Freq: Once | INTRAVENOUS | Status: AC
  Administered 2013-10-06: 13:00:00 via INTRAVENOUS

## 2013-10-06 MED ORDER — SODIUM CHLORIDE 0.9 % IJ SOLN
10.0000 mL | INTRAMUSCULAR | Status: DC | PRN
  Administered 2013-10-06: 10 mL
  Filled 2013-10-06: qty 10

## 2013-10-06 MED ORDER — DEXAMETHASONE SODIUM PHOSPHATE 20 MG/5ML IJ SOLN
INTRAMUSCULAR | Status: AC
  Filled 2013-10-06: qty 5

## 2013-10-06 MED ORDER — SODIUM CHLORIDE 0.9 % IJ SOLN
10.0000 mL | INTRAMUSCULAR | Status: DC | PRN
  Administered 2013-10-06: 10 mL via INTRAVENOUS
  Filled 2013-10-06: qty 10

## 2013-10-06 MED ORDER — ONDANSETRON 16 MG/50ML IVPB (CHCC)
16.0000 mg | Freq: Once | INTRAVENOUS | Status: AC
  Administered 2013-10-06: 16 mg via INTRAVENOUS

## 2013-10-06 MED ORDER — DEXAMETHASONE SODIUM PHOSPHATE 20 MG/5ML IJ SOLN
20.0000 mg | Freq: Once | INTRAMUSCULAR | Status: AC
  Administered 2013-10-06: 20 mg via INTRAVENOUS

## 2013-10-06 MED ORDER — ONDANSETRON 16 MG/50ML IVPB (CHCC)
INTRAVENOUS | Status: AC
  Filled 2013-10-06: qty 16

## 2013-10-06 MED ORDER — PACLITAXEL PROTEIN-BOUND CHEMO INJECTION 100 MG
80.0000 mg/m2 | Freq: Once | INTRAVENOUS | Status: AC
  Administered 2013-10-06: 150 mg via INTRAVENOUS
  Filled 2013-10-06: qty 30

## 2013-10-06 NOTE — Patient Instructions (Signed)
[facility] Discharge Instructions for Patients Receiving Chemotherapy  Today you received the following chemotherapy agents: Abraxane.  To help prevent nausea and vomiting after your treatment, we encourage you to take your nausea medication as prescribed.   If you develop nausea and vomiting that is not controlled by your nausea medication, call the clinic.   BELOW ARE SYMPTOMS THAT SHOULD BE REPORTED IMMEDIATELY:  *FEVER GREATER THAN 100.5 F  *CHILLS WITH OR WITHOUT FEVER  NAUSEA AND VOMITING THAT IS NOT CONTROLLED WITH YOUR NAUSEA MEDICATION  *UNUSUAL SHORTNESS OF BREATH  *UNUSUAL BRUISING OR BLEEDING  TENDERNESS IN MOUTH AND THROAT WITH OR WITHOUT PRESENCE OF ULCERS  *URINARY PROBLEMS  *BOWEL PROBLEMS  UNUSUAL RASH Items with * indicate a potential emergency and should be followed up as soon as possible.  Feel free to call the clinic you have any questions or concerns. The clinic phone number is [phone].

## 2013-10-06 NOTE — Telephone Encounter (Signed)
PLAN:  - consider CSF analysis to look for evidence of metastatic spread to lumbar nerve roots; would discuss with Dr. Julien Nordmann to see if detection of CSF malignant cells would change cancer treatment before pursing this.  - consider myelogram to evaluate for spinal stenosis due to cyst formation at L5-S1 to S2 levels; would discuss with Dr. Louanne Skye to see if this would change potential surgical mgmt options  - consider increasing gabapentin to 600mg  TID; will defer pain mgmt to oncology, orthopedic surgery or pain mgmt clinic   Penni Bombard, MD 4/94/4967, 5:91 PM Certified in Neurology, Neurophysiology and Neuroimaging  [facility] [address] [place], [place] [address] [phone]

## 2013-10-06 NOTE — Telephone Encounter (Signed)
Wife called and stated pt needs refill today . He stopped oxycodone 5 q 4 prn for a week and then had to restart it. I confirmed with local pharmacy  that last rx was dated 09/02/13 for #90 per Aw[facility] Metro. I update med list with this information. Rx completed and given to pt.

## 2013-10-06 NOTE — Patient Instructions (Signed)

## 2013-10-10 ENCOUNTER — Telehealth: Payer: Self-pay | Admitting: *Deleted

## 2013-10-10 NOTE — Telephone Encounter (Signed)
Pt's wife called stating they will not be coming for his appts on 9/24.  Appts cancelled.  Pt's wife states she thought pt was only coming in for chemo for 2 weeks on, 1 week off.  Informed her that per Dr Vista Mink, pt is now weekly abrxane, no carboplatin. She verbalized understanding.  SLJ

## 2013-10-13 ENCOUNTER — Ambulatory Visit: Payer: Commercial Managed Care - HMO

## 2013-10-13 ENCOUNTER — Telehealth: Payer: Self-pay | Admitting: Internal Medicine

## 2013-10-13 ENCOUNTER — Telehealth: Payer: Self-pay | Admitting: Medical Oncology

## 2013-10-13 ENCOUNTER — Other Ambulatory Visit: Payer: Self-pay | Admitting: Medical Oncology

## 2013-10-13 ENCOUNTER — Other Ambulatory Visit: Payer: Commercial Managed Care - HMO

## 2013-10-13 NOTE — Telephone Encounter (Signed)
[facility] called and requested we cancel all of [place] appts.  He is transferring to [place] because it is so close to their new home.. She thanked [place] for our care and to please pass this on to Dr Julien Nordmann and his staff.

## 2013-10-19 ENCOUNTER — Telehealth: Payer: Self-pay | Admitting: Internal Medicine

## 2013-10-19 ENCOUNTER — Encounter: Payer: Self-pay | Admitting: Hematology & Oncology

## 2013-10-19 NOTE — Progress Notes (Signed)
FAXED TO TARA GREEN @ [facility] PT IS NOW SEEING DR ENNEVER.

## 2013-10-19 NOTE — Telephone Encounter (Signed)
Rec'd from [facility] forward 6 pages to Dr.Norins

## 2013-10-20 ENCOUNTER — Other Ambulatory Visit: Payer: Commercial Managed Care - HMO

## 2013-10-20 ENCOUNTER — Ambulatory Visit: Payer: Commercial Managed Care - HMO

## 2013-10-27 ENCOUNTER — Ambulatory Visit: Payer: Commercial Managed Care - HMO | Admitting: Physician Assistant

## 2013-10-27 ENCOUNTER — Other Ambulatory Visit: Payer: Commercial Managed Care - HMO

## 2013-10-27 ENCOUNTER — Ambulatory Visit: Payer: Commercial Managed Care - HMO

## 2013-11-03 ENCOUNTER — Ambulatory Visit: Payer: Commercial Managed Care - HMO

## 2013-11-18 IMAGING — CT CT CHEST W/ CM
2 of 5 series · 16 of 46 positions shown, 18 images · IV contrast (OMNIPAQUE)
Comparison: PET CT dated 08/06/2012.

CT CHEST

CLINICAL DATA: Right-sided small cell lung cancer.  History of
tongue cancer in 9556, chemotherapy and XRT complete.  History of
left renal cancer status post RFA 9556.  Prior cholecystectomy and
appendectomy.

CT CHEST, ABDOMEN AND PELVIS WITH CONTRAST
TECHNIQUE: Multidetector CT imaging of the chest, abdomen and
pelvis was performed following the standard protocol during bolus
administration of intravenous contrast.
Contrast: 100mL OMNIPAQUE IOHEXOL 300 MG/ML  SOLN

[Series 2: cap with st · axial · 0.89mm/px · z∈[-675,-75]mm · 13 of 138 slices shown, 15 images]
[im 9/138  soft-tissue]
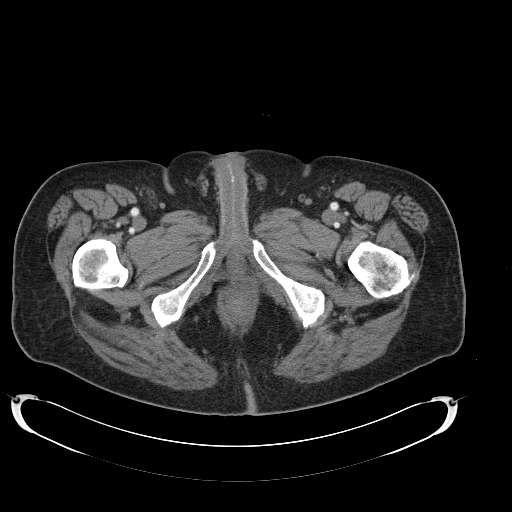
[im 9/138  bone]
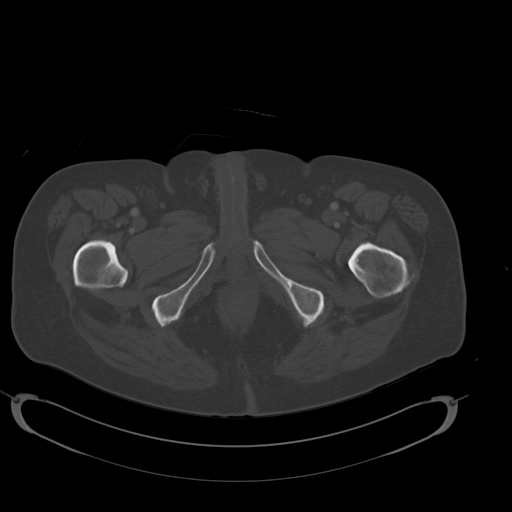
[im 17/138  soft-tissue]
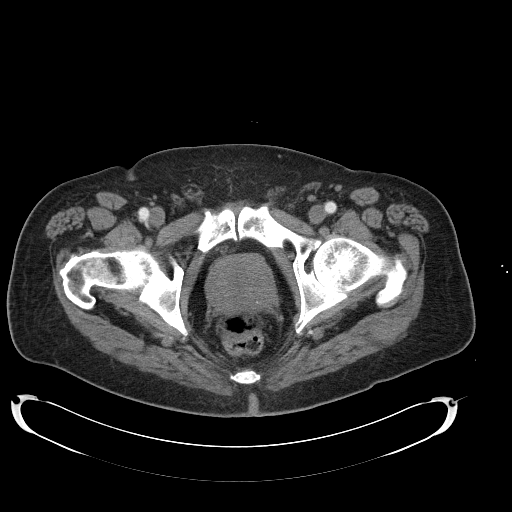
[im 33/138  soft-tissue]
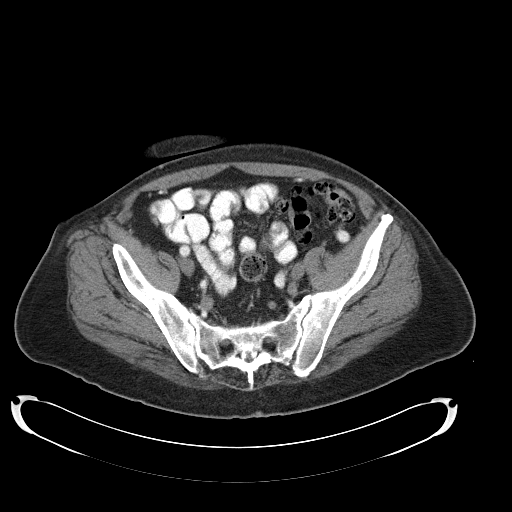
[im 41/138  soft-tissue]
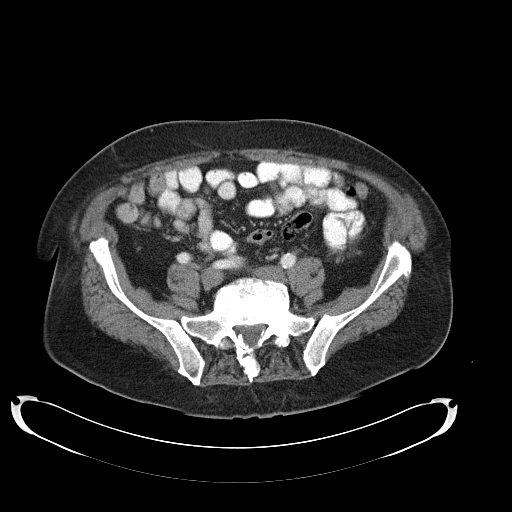
[im 49/138  soft-tissue]
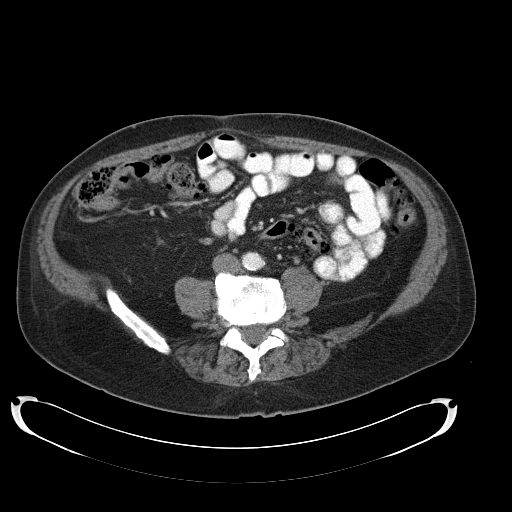
[im 57/138  soft-tissue]
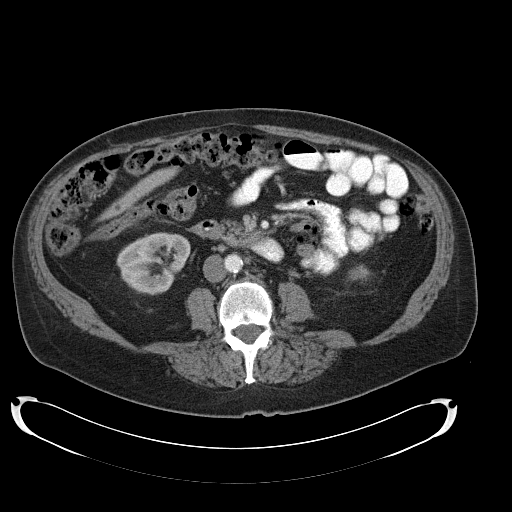
[im 73/138  soft-tissue]
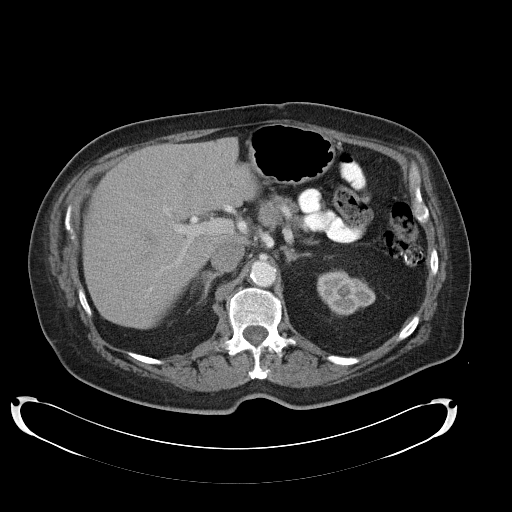
[im 81/138  soft-tissue]
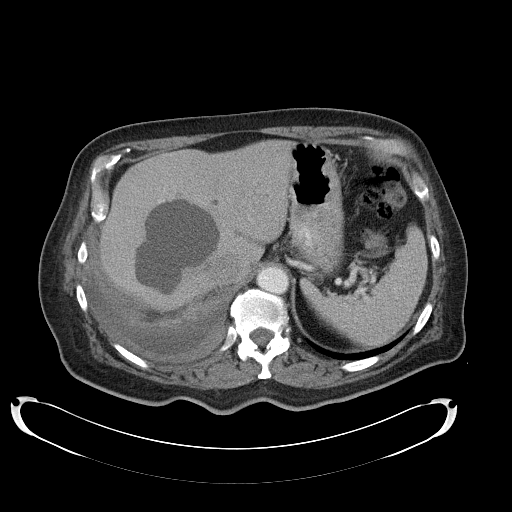
[im 89/138  soft-tissue]
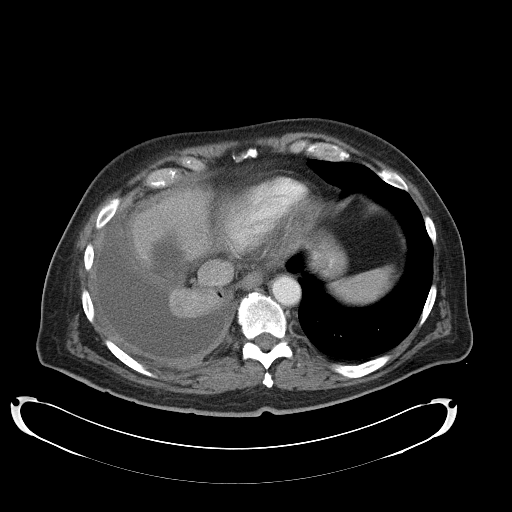
[im 89/138  bone]
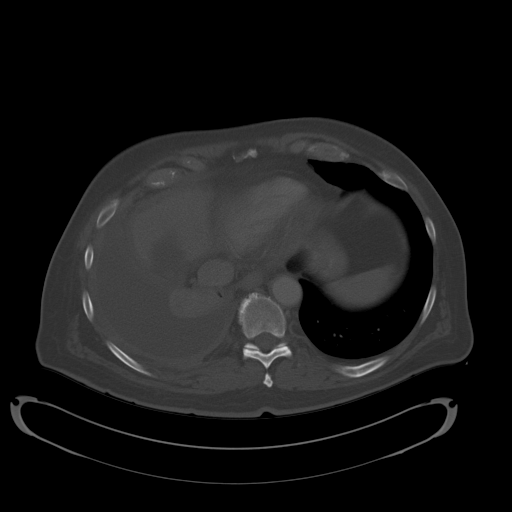
[im 97/138  soft-tissue]
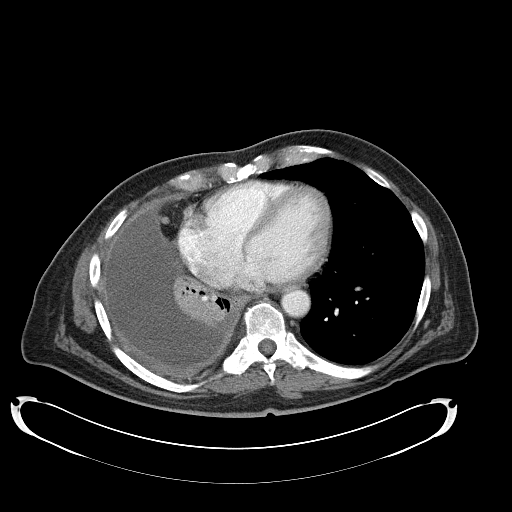
[im 105/138  soft-tissue]
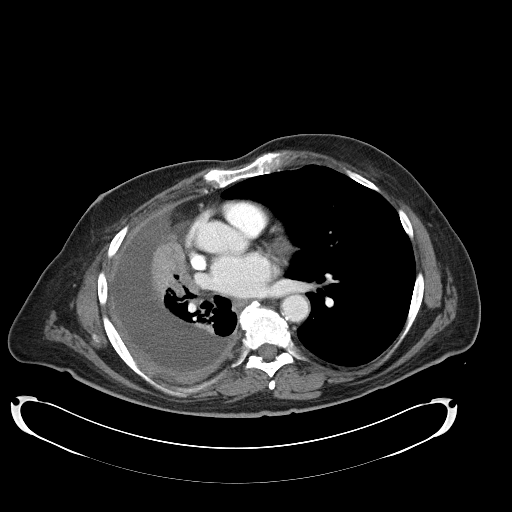
[im 121/138  soft-tissue]
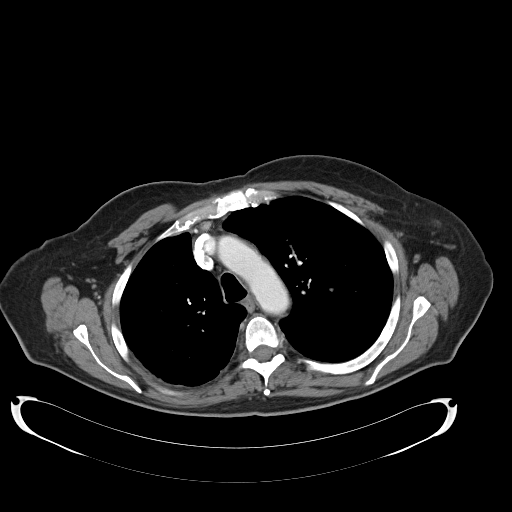
[im 129/138  soft-tissue]
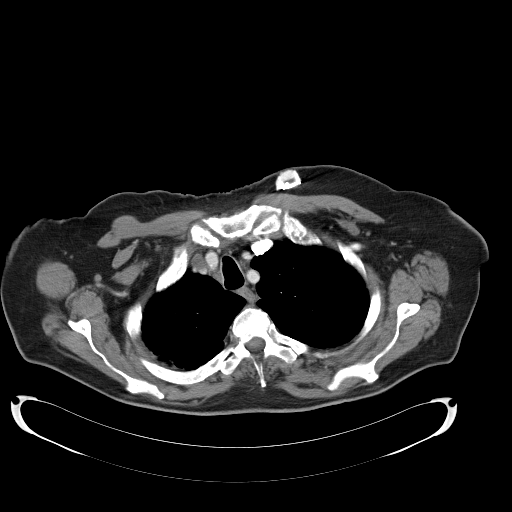

[Series 602: cor · coronal · 1.34mm/px · 3 of 91 slices shown]
[im 31/91  soft-tissue]
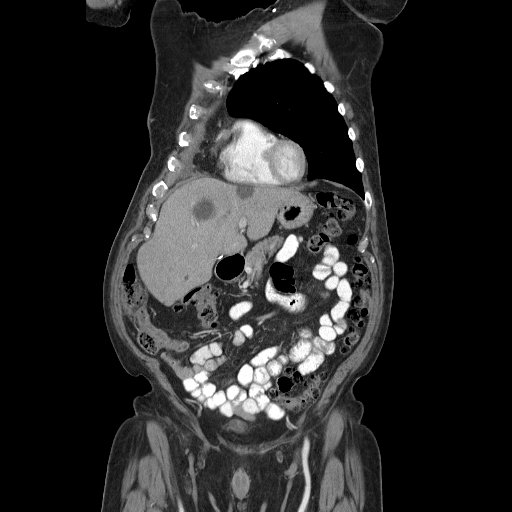
[im 41/91  soft-tissue]
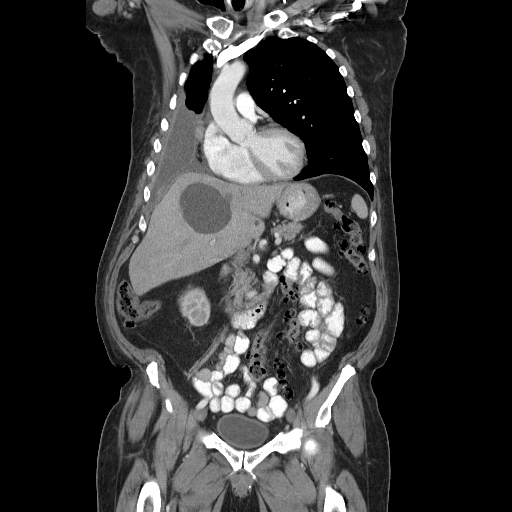
[im 51/91  soft-tissue]
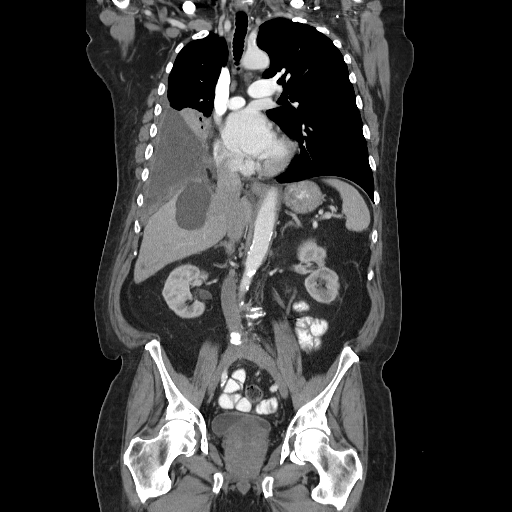

[16 of 46 positions shown; findings below may reference images not displayed]

FINDINGS: Volume loss in the right hemithorax.  Moderate to large
right pleural effusion with thickened pleural rind (series 2/image
43), likely malignant, grossly unchanged. Mild pleural-based
nodularity at the anterior lung base measures 9 mm (series 2/image
31).

Underlying mass-like consolidation of the right middle lobe,
measuring 2.2 x 5.8 cm (series 2/image 32), hypermetabolic on prior
PET and corresponding to known small cell cancer, mildly improved.
Additional mass-like consolidation in the right lower lobe,
measuring 2.9 x 4.5 cm (series 2/image 47), non-FDG-avid.

Mild cardiomegaly.  No pericardial effusion.  Mild atherosclerotic
calcifications of the aortic arch.

Left chest port.

7 mm short axis precarinal node with preservation of the normal
fatty hilum (series 2/image 24).  No suspicious hilar or axillary
lymphadenopathy.
IMPRESSION: Mass-like consolidation in the right middle lobe, corresponding to
known small cell cancer, mildly improved.

Additional mass-like consolidation in the right lower lobe, non-FDG-
avid.

Moderate to large right pleural effusion with thickened pleural
rind and nodularity, likely malignant, grossly unchanged.

CT ABDOMEN AND PELVIS
FINDINGS: Multiple hepatic cysts, including a bilobed 8.5 x 6.5 cm
cyst in the central right liver (series 2/image 87).

Spleen, pancreas, and adrenal glands are within normal limits.

Status post cholecystectomy.  No intrahepatic or extrahepatic
ductal dilatation.

Postprocedural changes in the lateral interpolar left kidney
(series 2/image 72).  Right kidney is within normal limits.  No
hydronephrosis.

No evidence of bowel obstruction.  Extensive colonic
diverticulosis, without associated inflammatory changes.

Atherosclerotic calcifications of the abdominal aorta and branch
vessels.

No abdominopelvic ascites.

No suspicious abdominopelvic lymphadenopathy.

Prostatomegaly, measuring 6.3 cm in maximal dimension, with
enlargement of the central gland which indents the base of the
bladder.

Bladder is underdistended.

Tiny fat-containing right inguinal hernia.

Degenerative changes of the lumbar spine.
IMPRESSION: No evidence of metastatic disease in the abdomen/pelvis.

Postprocedural changes in the lateral interpolar left kidney.

Additional ancillary findings as above.

## 2013-12-13 ENCOUNTER — Telehealth: Payer: Self-pay | Admitting: Internal Medicine

## 2013-12-13 NOTE — Telephone Encounter (Signed)
Rec'd records from [place]., Forwarding 6pgs to Dr.Norins

## 2013-12-30 ENCOUNTER — Telehealth: Payer: Self-pay | Admitting: Internal Medicine

## 2013-12-30 NOTE — Telephone Encounter (Signed)
Rec'd from [facility] Hematology / Oncology forward 6 pages to Dr.Norins

## 2014-01-04 ENCOUNTER — Telehealth: Payer: Self-pay | Admitting: Physician Assistant

## 2014-01-04 NOTE — Telephone Encounter (Signed)
Caller name:[facility] Relation to SP:[id number] Call back number:[phone] ext [phone] Pharmacy:  Reason for call: needing confirmation if cody received a referral form for the pt and if it was sent to the insurance provider, states form was faxed on 12/30/16.

## 2014-01-04 NOTE — Telephone Encounter (Signed)
Fax has not been received. Called number below and left message for Hubert Azure stating fax was not received and to please fax again at [phone]. [id number]

## 2014-03-10 ENCOUNTER — Telehealth: Payer: Self-pay | Admitting: Internal Medicine

## 2014-03-10 NOTE — Telephone Encounter (Signed)
[facility]ematology/Oncology forward 6 pages to Dr.Norins

## 2014-03-16 ENCOUNTER — Telehealth: Payer: Self-pay | Admitting: Internal Medicine

## 2014-03-16 NOTE — Telephone Encounter (Signed)
Rec'd from [facility] Hematology / Oncology forward 6 pages to Dr. Linda Hedges

## 2014-03-30 ENCOUNTER — Telehealth: Payer: Self-pay | Admitting: Internal Medicine

## 2014-03-30 NOTE — Telephone Encounter (Signed)
rec'd records form [facility]., Forwarding 6 pages to [place]

## 2014-04-21 DEATH — deceased

## 2015-07-10 ENCOUNTER — Other Ambulatory Visit: Payer: Self-pay | Admitting: Nurse Practitioner
